# Patient Record
Sex: Male | Born: 1935 | Race: White | Hispanic: No | Smoking: Former smoker
Health system: Southern US, Community
[De-identification: ages and names within clinical notes are randomized; demographics above are authoritative.]

## PROBLEM LIST (undated history)

## (undated) DIAGNOSIS — D126 Benign neoplasm of colon, unspecified: Secondary | ICD-10-CM

## (undated) DIAGNOSIS — G459 Transient cerebral ischemic attack, unspecified: Secondary | ICD-10-CM

## (undated) DIAGNOSIS — E785 Hyperlipidemia, unspecified: Secondary | ICD-10-CM

## (undated) DIAGNOSIS — J309 Allergic rhinitis, unspecified: Secondary | ICD-10-CM

## (undated) DIAGNOSIS — L723 Sebaceous cyst: Secondary | ICD-10-CM

## (undated) DIAGNOSIS — K5790 Diverticulosis of intestine, part unspecified, without perforation or abscess without bleeding: Secondary | ICD-10-CM

## (undated) DIAGNOSIS — C4490 Unspecified malignant neoplasm of skin, unspecified: Secondary | ICD-10-CM

## (undated) DIAGNOSIS — N400 Enlarged prostate without lower urinary tract symptoms: Secondary | ICD-10-CM

## (undated) DIAGNOSIS — N529 Male erectile dysfunction, unspecified: Secondary | ICD-10-CM

## (undated) DIAGNOSIS — L817 Pigmented purpuric dermatosis: Secondary | ICD-10-CM

## (undated) DIAGNOSIS — R413 Other amnesia: Secondary | ICD-10-CM

## (undated) HISTORY — DX: Male erectile dysfunction, unspecified: N52.9

## (undated) HISTORY — PX: COLONOSCOPY W/ POLYPECTOMY: SHX1380

## (undated) HISTORY — DX: Diverticulosis of intestine, part unspecified, without perforation or abscess without bleeding: K57.90

## (undated) HISTORY — DX: Pigmented purpuric dermatosis: L81.7

## (undated) HISTORY — DX: Transient cerebral ischemic attack, unspecified: G45.9

## (undated) HISTORY — DX: Allergic rhinitis, unspecified: J30.9

## (undated) HISTORY — PX: TONSILLECTOMY: SUR1361

## (undated) HISTORY — DX: Sebaceous cyst: L72.3

## (undated) HISTORY — DX: Hyperlipidemia, unspecified: E78.5

## (undated) HISTORY — DX: Benign prostatic hyperplasia without lower urinary tract symptoms: N40.0

## (undated) HISTORY — DX: Other amnesia: R41.3

## (undated) HISTORY — DX: Unspecified malignant neoplasm of skin, unspecified: C44.90

## (undated) HISTORY — DX: Benign neoplasm of colon, unspecified: D12.6

---

## 1988-05-05 DIAGNOSIS — E785 Hyperlipidemia, unspecified: Secondary | ICD-10-CM | POA: Insufficient documentation

## 1988-05-05 HISTORY — DX: Hyperlipidemia, unspecified: E78.5

## 1993-05-05 DIAGNOSIS — D126 Benign neoplasm of colon, unspecified: Secondary | ICD-10-CM

## 1993-05-05 HISTORY — DX: Benign neoplasm of colon, unspecified: D12.6

## 1993-05-05 HISTORY — PX: SEPTOPLASTY: SUR1290

## 1998-05-05 DIAGNOSIS — C4490 Unspecified malignant neoplasm of skin, unspecified: Secondary | ICD-10-CM

## 1998-05-05 HISTORY — DX: Unspecified malignant neoplasm of skin, unspecified: C44.90

## 2000-09-21 ENCOUNTER — Encounter (INDEPENDENT_AMBULATORY_CARE_PROVIDER_SITE_OTHER): Payer: Self-pay

## 2000-09-21 ENCOUNTER — Other Ambulatory Visit: Admission: RE | Admit: 2000-09-21 | Discharge: 2000-09-21 | Payer: Self-pay | Admitting: Gastroenterology

## 2000-09-21 DIAGNOSIS — K5730 Diverticulosis of large intestine without perforation or abscess without bleeding: Secondary | ICD-10-CM

## 2000-09-21 DIAGNOSIS — K5790 Diverticulosis of intestine, part unspecified, without perforation or abscess without bleeding: Secondary | ICD-10-CM

## 2000-09-21 HISTORY — DX: Diverticulosis of intestine, part unspecified, without perforation or abscess without bleeding: K57.90

## 2000-09-21 HISTORY — DX: Diverticulosis of large intestine without perforation or abscess without bleeding: K57.30

## 2002-11-28 ENCOUNTER — Encounter: Payer: Self-pay | Admitting: Family Medicine

## 2002-11-28 ENCOUNTER — Encounter: Admission: RE | Admit: 2002-11-28 | Discharge: 2002-11-28 | Payer: Self-pay | Admitting: Family Medicine

## 2003-07-04 HISTORY — PX: OTHER SURGICAL HISTORY: SHX169

## 2003-10-05 HISTORY — PX: OTHER SURGICAL HISTORY: SHX169

## 2004-02-03 ENCOUNTER — Encounter: Payer: Self-pay | Admitting: Family Medicine

## 2004-02-03 LAB — CONVERTED CEMR LAB: PSA: 2 ng/mL

## 2004-08-03 ENCOUNTER — Encounter: Payer: Self-pay | Admitting: Family Medicine

## 2004-08-03 LAB — CONVERTED CEMR LAB: PSA: 2.8 ng/mL

## 2004-08-09 ENCOUNTER — Ambulatory Visit: Payer: Self-pay | Admitting: Family Medicine

## 2004-08-13 ENCOUNTER — Ambulatory Visit: Payer: Self-pay | Admitting: Family Medicine

## 2005-02-12 ENCOUNTER — Ambulatory Visit: Payer: Self-pay | Admitting: Family Medicine

## 2005-08-03 ENCOUNTER — Encounter: Payer: Self-pay | Admitting: Family Medicine

## 2005-08-03 LAB — CONVERTED CEMR LAB: PSA: 5.16 ng/mL

## 2005-08-11 ENCOUNTER — Ambulatory Visit: Payer: Self-pay | Admitting: Family Medicine

## 2005-08-13 ENCOUNTER — Ambulatory Visit: Payer: Self-pay | Admitting: Family Medicine

## 2005-08-22 ENCOUNTER — Ambulatory Visit: Payer: Self-pay | Admitting: Family Medicine

## 2005-08-25 ENCOUNTER — Ambulatory Visit: Payer: Self-pay | Admitting: Family Medicine

## 2005-09-02 ENCOUNTER — Encounter: Payer: Self-pay | Admitting: Family Medicine

## 2005-09-02 LAB — CONVERTED CEMR LAB: PSA: 5.24 ng/mL

## 2005-09-12 ENCOUNTER — Ambulatory Visit: Payer: Self-pay | Admitting: Family Medicine

## 2005-09-19 ENCOUNTER — Ambulatory Visit: Payer: Self-pay | Admitting: Family Medicine

## 2005-11-02 ENCOUNTER — Encounter: Payer: Self-pay | Admitting: Family Medicine

## 2005-11-02 LAB — CONVERTED CEMR LAB: PSA: 4.39 ng/mL

## 2005-11-13 ENCOUNTER — Ambulatory Visit: Payer: Self-pay | Admitting: Family Medicine

## 2005-11-18 ENCOUNTER — Ambulatory Visit: Payer: Self-pay | Admitting: Family Medicine

## 2006-01-03 ENCOUNTER — Encounter: Payer: Self-pay | Admitting: Family Medicine

## 2006-01-03 LAB — CONVERTED CEMR LAB: PSA: 4.32 ng/mL

## 2006-01-20 ENCOUNTER — Ambulatory Visit: Payer: Self-pay | Admitting: Family Medicine

## 2006-08-21 ENCOUNTER — Ambulatory Visit: Payer: Self-pay | Admitting: Family Medicine

## 2006-08-21 LAB — CONVERTED CEMR LAB
ALT: 26 units/L (ref 0–40)
AST: 28 units/L (ref 0–37)
Albumin: 4.1 g/dL (ref 3.5–5.2)
Alkaline Phosphatase: 55 units/L (ref 39–117)
BUN: 11 mg/dL (ref 6–23)
Bilirubin, Direct: 0.1 mg/dL (ref 0.0–0.3)
CO2: 26 meq/L (ref 19–32)
Calcium: 9.1 mg/dL (ref 8.4–10.5)
Chloride: 108 meq/L (ref 96–112)
Cholesterol: 232 mg/dL (ref 0–200)
Creatinine, Ser: 1.2 mg/dL (ref 0.4–1.5)
Direct LDL: 157.4 mg/dL
GFR calc Af Amer: 76 mL/min
GFR calc non Af Amer: 63 mL/min
Glucose, Bld: 95 mg/dL (ref 70–99)
HDL: 36.2 mg/dL — ABNORMAL LOW (ref >39.0)
PSA: 4.67 ng/mL — ABNORMAL HIGH (ref 0.10–4.00)
Potassium: 4.7 meq/L (ref 3.5–5.1)
Sodium: 142 meq/L (ref 135–145)
TSH: 1.51 microintl units/mL (ref 0.35–5.50)
Total Bilirubin: 1 mg/dL (ref 0.3–1.2)
Total CHOL/HDL Ratio: 6.4
Total Protein: 6.6 g/dL (ref 6.0–8.3)
Triglycerides: 198 mg/dL — ABNORMAL HIGH (ref 0–149)
VLDL: 40 mg/dL (ref 0–40)

## 2006-08-24 ENCOUNTER — Ambulatory Visit: Payer: Self-pay | Admitting: Family Medicine

## 2006-08-24 DIAGNOSIS — Z860100 Personal history of colon polyps, unspecified: Secondary | ICD-10-CM | POA: Insufficient documentation

## 2006-08-24 DIAGNOSIS — R972 Elevated prostate specific antigen [PSA]: Secondary | ICD-10-CM | POA: Insufficient documentation

## 2006-08-24 HISTORY — DX: Personal history of colonic polyps: Z86.010

## 2006-08-24 HISTORY — DX: Personal history of colon polyps, unspecified: Z86.0100

## 2006-09-11 ENCOUNTER — Encounter (HOSPITAL_BASED_OUTPATIENT_CLINIC_OR_DEPARTMENT_OTHER): Admission: RE | Admit: 2006-09-11 | Discharge: 2006-12-10 | Payer: Self-pay | Admitting: Surgery

## 2006-09-24 ENCOUNTER — Ambulatory Visit: Payer: Self-pay | Admitting: Gastroenterology

## 2006-10-08 ENCOUNTER — Encounter: Payer: Self-pay | Admitting: Gastroenterology

## 2006-10-08 ENCOUNTER — Encounter: Payer: Self-pay | Admitting: Family Medicine

## 2006-10-08 ENCOUNTER — Ambulatory Visit: Payer: Self-pay | Admitting: Gastroenterology

## 2006-12-14 ENCOUNTER — Encounter: Payer: Self-pay | Admitting: Family Medicine

## 2006-12-28 ENCOUNTER — Encounter: Payer: Self-pay | Admitting: Family Medicine

## 2007-02-16 ENCOUNTER — Encounter: Payer: Self-pay | Admitting: Family Medicine

## 2007-02-24 ENCOUNTER — Encounter: Payer: Self-pay | Admitting: Family Medicine

## 2007-02-25 ENCOUNTER — Encounter: Payer: Self-pay | Admitting: Family Medicine

## 2007-04-13 ENCOUNTER — Encounter: Payer: Self-pay | Admitting: Family Medicine

## 2007-05-06 HISTORY — PX: OTHER SURGICAL HISTORY: SHX169

## 2007-05-21 ENCOUNTER — Encounter: Payer: Self-pay | Admitting: Family Medicine

## 2007-05-28 ENCOUNTER — Encounter: Payer: Self-pay | Admitting: Family Medicine

## 2007-06-18 ENCOUNTER — Encounter: Payer: Self-pay | Admitting: Family Medicine

## 2007-08-19 ENCOUNTER — Ambulatory Visit: Payer: Self-pay | Admitting: Family Medicine

## 2007-08-19 LAB — CONVERTED CEMR LAB
ALT: 23 units/L (ref 0–53)
AST: 27 units/L (ref 0–37)
Albumin: 4.2 g/dL (ref 3.5–5.2)
Alkaline Phosphatase: 59 units/L (ref 39–117)
BUN: 11 mg/dL (ref 6–23)
Bilirubin, Direct: 0.1 mg/dL (ref 0.0–0.3)
CO2: 31 meq/L (ref 19–32)
Calcium: 9.4 mg/dL (ref 8.4–10.5)
Chloride: 107 meq/L (ref 96–112)
Cholesterol: 204 mg/dL (ref 0–200)
Creatinine, Ser: 1.1 mg/dL (ref 0.4–1.5)
Direct LDL: 143 mg/dL
GFR calc Af Amer: 84 mL/min
GFR calc non Af Amer: 69 mL/min
Glucose, Bld: 92 mg/dL (ref 70–99)
HDL: 33.3 mg/dL — ABNORMAL LOW (ref >39.0)
PSA: 4.69 ng/mL — ABNORMAL HIGH (ref 0.10–4.00)
Potassium: 4.7 meq/L (ref 3.5–5.1)
Sodium: 140 meq/L (ref 135–145)
TSH: 2.04 microintl units/mL (ref 0.35–5.50)
Total Bilirubin: 0.9 mg/dL (ref 0.3–1.2)
Total CHOL/HDL Ratio: 6.1
Total Protein: 7.1 g/dL (ref 6.0–8.3)
Triglycerides: 160 mg/dL — ABNORMAL HIGH (ref 0–149)
VLDL: 32 mg/dL (ref 0–40)

## 2007-08-24 ENCOUNTER — Ambulatory Visit: Payer: Self-pay | Admitting: Family Medicine

## 2007-08-24 DIAGNOSIS — G459 Transient cerebral ischemic attack, unspecified: Secondary | ICD-10-CM | POA: Insufficient documentation

## 2007-09-08 ENCOUNTER — Encounter: Payer: Self-pay | Admitting: Family Medicine

## 2007-09-08 ENCOUNTER — Ambulatory Visit: Payer: Self-pay

## 2007-09-08 HISTORY — PX: OTHER SURGICAL HISTORY: SHX169

## 2007-09-15 ENCOUNTER — Encounter: Payer: Self-pay | Admitting: Family Medicine

## 2007-09-29 ENCOUNTER — Ambulatory Visit: Payer: Self-pay | Admitting: Family Medicine

## 2007-09-29 LAB — CONVERTED CEMR LAB
ALT: 33 units/L (ref 0–53)
AST: 35 units/L (ref 0–37)

## 2007-11-19 ENCOUNTER — Ambulatory Visit: Payer: Self-pay | Admitting: Family Medicine

## 2007-11-21 LAB — CONVERTED CEMR LAB
ALT: 34 units/L (ref 0–53)
AST: 32 units/L (ref 0–37)
Cholesterol: 133 mg/dL (ref 0–200)
HDL: 38.9 mg/dL — ABNORMAL LOW (ref >39.0)
LDL Cholesterol: 71 mg/dL (ref 0–99)
Total CHOL/HDL Ratio: 3.4
Triglycerides: 117 mg/dL (ref 0–149)
VLDL: 23 mg/dL (ref 0–40)

## 2007-11-23 ENCOUNTER — Encounter: Payer: Self-pay | Admitting: Family Medicine

## 2007-11-25 ENCOUNTER — Ambulatory Visit: Payer: Self-pay | Admitting: Family Medicine

## 2008-05-01 ENCOUNTER — Ambulatory Visit: Payer: Self-pay | Admitting: Family Medicine

## 2008-05-26 ENCOUNTER — Encounter: Payer: Self-pay | Admitting: Family Medicine

## 2008-06-29 ENCOUNTER — Encounter: Payer: Self-pay | Admitting: Family Medicine

## 2008-09-28 ENCOUNTER — Ambulatory Visit: Payer: Self-pay | Admitting: Family Medicine

## 2008-09-28 LAB — CONVERTED CEMR LAB
ALT: 39 units/L (ref 0–53)
AST: 37 units/L (ref 0–37)
Albumin: 4.2 g/dL (ref 3.5–5.2)
Alkaline Phosphatase: 50 units/L (ref 39–117)
BUN: 12 mg/dL (ref 6–23)
Basophils Absolute: 0 10*3/uL (ref 0.0–0.1)
Basophils Relative: 0.6 % (ref 0.0–3.0)
Bilirubin, Direct: 0.1 mg/dL (ref 0.0–0.3)
CO2: 28 meq/L (ref 19–32)
Calcium: 8.8 mg/dL (ref 8.4–10.5)
Chloride: 108 meq/L (ref 96–112)
Cholesterol: 138 mg/dL (ref 0–200)
Creatinine, Ser: 1 mg/dL (ref 0.4–1.5)
Eosinophils Absolute: 0.1 10*3/uL (ref 0.0–0.7)
Eosinophils Relative: 1.6 % (ref 0.0–5.0)
GFR calc non Af Amer: 76.88 mL/min (ref >60)
Glucose, Bld: 86 mg/dL (ref 70–99)
HCT: 45.6 % (ref 39.0–52.0)
HDL: 38.4 mg/dL — ABNORMAL LOW (ref >39.00)
Hemoglobin: 16.1 g/dL (ref 13.0–17.0)
LDL Cholesterol: 70 mg/dL (ref 0–99)
Lymphocytes Relative: 29.5 % (ref 12.0–46.0)
Lymphs Abs: 1.5 10*3/uL (ref 0.7–4.0)
MCHC: 35.4 g/dL (ref 30.0–36.0)
MCV: 93.2 fL (ref 78.0–100.0)
Monocytes Absolute: 0.5 10*3/uL (ref 0.1–1.0)
Monocytes Relative: 10.8 % (ref 3.0–12.0)
Neutro Abs: 2.9 10*3/uL (ref 1.4–7.7)
Neutrophils Relative %: 57.5 % (ref 43.0–77.0)
PSA: 3.93 ng/mL (ref 0.10–4.00)
Platelets: 168 10*3/uL (ref 150.0–400.0)
Potassium: 4.7 meq/L (ref 3.5–5.1)
RBC: 4.89 M/uL (ref 4.22–5.81)
RDW: 11.6 % (ref 11.5–14.6)
Sodium: 140 meq/L (ref 135–145)
TSH: 1.56 microintl units/mL (ref 0.35–5.50)
Total Bilirubin: 1 mg/dL (ref 0.3–1.2)
Total CHOL/HDL Ratio: 4
Total Protein: 7 g/dL (ref 6.0–8.3)
Triglycerides: 147 mg/dL (ref 0.0–149.0)
VLDL: 29.4 mg/dL (ref 0.0–40.0)
WBC: 5 10*3/uL (ref 4.5–10.5)

## 2008-10-03 ENCOUNTER — Ambulatory Visit: Payer: Self-pay | Admitting: Family Medicine

## 2008-10-03 DIAGNOSIS — R030 Elevated blood-pressure reading, without diagnosis of hypertension: Secondary | ICD-10-CM | POA: Insufficient documentation

## 2009-01-03 ENCOUNTER — Ambulatory Visit: Payer: Self-pay | Admitting: Family Medicine

## 2009-01-03 DIAGNOSIS — K5909 Other constipation: Secondary | ICD-10-CM

## 2009-01-03 DIAGNOSIS — K5641 Fecal impaction: Secondary | ICD-10-CM | POA: Insufficient documentation

## 2009-05-05 HISTORY — PX: OTHER SURGICAL HISTORY: SHX169

## 2009-09-18 ENCOUNTER — Encounter (INDEPENDENT_AMBULATORY_CARE_PROVIDER_SITE_OTHER): Payer: Self-pay | Admitting: *Deleted

## 2009-09-20 ENCOUNTER — Encounter (INDEPENDENT_AMBULATORY_CARE_PROVIDER_SITE_OTHER): Payer: Self-pay | Admitting: *Deleted

## 2009-10-04 ENCOUNTER — Ambulatory Visit: Payer: Self-pay | Admitting: Family Medicine

## 2009-10-04 LAB — CONVERTED CEMR LAB
ALT: 30 units/L (ref 0–53)
AST: 31 units/L (ref 0–37)
Albumin: 4.4 g/dL (ref 3.5–5.2)
Alkaline Phosphatase: 49 units/L (ref 39–117)
BUN: 16 mg/dL (ref 6–23)
Basophils Absolute: 0 10*3/uL (ref 0.0–0.1)
Basophils Relative: 0.7 % (ref 0.0–3.0)
Bilirubin, Direct: 0.2 mg/dL (ref 0.0–0.3)
CO2: 30 meq/L (ref 19–32)
Calcium: 9.2 mg/dL (ref 8.4–10.5)
Chloride: 106 meq/L (ref 96–112)
Cholesterol: 140 mg/dL (ref 0–200)
Creatinine, Ser: 0.9 mg/dL (ref 0.4–1.5)
Eosinophils Absolute: 0.1 10*3/uL (ref 0.0–0.7)
Eosinophils Relative: 1 % (ref 0.0–5.0)
GFR calc non Af Amer: 86.59 mL/min (ref >60)
Glucose, Bld: 85 mg/dL (ref 70–99)
HCT: 46 % (ref 39.0–52.0)
HDL: 42.8 mg/dL (ref >39.00)
Hemoglobin: 16 g/dL (ref 13.0–17.0)
LDL Cholesterol: 75 mg/dL (ref 0–99)
Lymphocytes Relative: 31.9 % (ref 12.0–46.0)
Lymphs Abs: 1.7 10*3/uL (ref 0.7–4.0)
MCHC: 34.9 g/dL (ref 30.0–36.0)
MCV: 94.5 fL (ref 78.0–100.0)
Monocytes Absolute: 0.6 10*3/uL (ref 0.1–1.0)
Monocytes Relative: 10.6 % (ref 3.0–12.0)
Neutro Abs: 3 10*3/uL (ref 1.4–7.7)
Neutrophils Relative %: 55.8 % (ref 43.0–77.0)
PSA: 5.58 ng/mL — ABNORMAL HIGH (ref 0.10–4.00)
Platelets: 158 10*3/uL (ref 150.0–400.0)
Potassium: 4.7 meq/L (ref 3.5–5.1)
RBC: 4.87 M/uL (ref 4.22–5.81)
RDW: 12.9 % (ref 11.5–14.6)
Sodium: 142 meq/L (ref 135–145)
TSH: 1.62 microintl units/mL (ref 0.35–5.50)
Total Bilirubin: 0.8 mg/dL (ref 0.3–1.2)
Total CHOL/HDL Ratio: 3
Total Protein: 6.8 g/dL (ref 6.0–8.3)
Triglycerides: 113 mg/dL (ref 0.0–149.0)
VLDL: 22.6 mg/dL (ref 0.0–40.0)
WBC: 5.3 10*3/uL (ref 4.5–10.5)

## 2009-10-09 ENCOUNTER — Ambulatory Visit: Payer: Self-pay | Admitting: Family Medicine

## 2009-10-09 DIAGNOSIS — N138 Other obstructive and reflux uropathy: Secondary | ICD-10-CM

## 2009-10-09 DIAGNOSIS — N401 Enlarged prostate with lower urinary tract symptoms: Secondary | ICD-10-CM | POA: Insufficient documentation

## 2009-10-09 HISTORY — DX: Benign prostatic hyperplasia with lower urinary tract symptoms: N40.1

## 2009-10-09 HISTORY — DX: Other obstructive and reflux uropathy: N13.8

## 2009-10-10 ENCOUNTER — Telehealth: Payer: Self-pay | Admitting: Family Medicine

## 2009-10-11 ENCOUNTER — Telehealth: Payer: Self-pay | Admitting: Family Medicine

## 2009-10-12 ENCOUNTER — Encounter (INDEPENDENT_AMBULATORY_CARE_PROVIDER_SITE_OTHER): Payer: Self-pay | Admitting: *Deleted

## 2009-10-17 ENCOUNTER — Ambulatory Visit: Payer: Self-pay | Admitting: Gastroenterology

## 2009-10-18 ENCOUNTER — Telehealth: Payer: Self-pay | Admitting: Gastroenterology

## 2009-10-26 ENCOUNTER — Telehealth: Payer: Self-pay | Admitting: Gastroenterology

## 2009-10-31 ENCOUNTER — Ambulatory Visit: Payer: Self-pay | Admitting: Gastroenterology

## 2009-11-01 LAB — HM COLONOSCOPY

## 2009-12-06 ENCOUNTER — Encounter (INDEPENDENT_AMBULATORY_CARE_PROVIDER_SITE_OTHER): Payer: Self-pay | Admitting: *Deleted

## 2010-05-02 ENCOUNTER — Ambulatory Visit
Admission: RE | Admit: 2010-05-02 | Discharge: 2010-05-02 | Payer: Self-pay | Source: Home / Self Care | Attending: Family Medicine | Admitting: Family Medicine

## 2010-05-02 DIAGNOSIS — K644 Residual hemorrhoidal skin tags: Secondary | ICD-10-CM | POA: Insufficient documentation

## 2010-05-02 HISTORY — DX: Residual hemorrhoidal skin tags: K64.4

## 2010-06-03 ENCOUNTER — Telehealth: Payer: Self-pay | Admitting: Family Medicine

## 2010-06-06 NOTE — Letter (Signed)
Summary: Nadara Eaton letter  [place] at [facility]  [address] [place], [place] [address]   Phone: [phone]  Fax: [phone]       12/06/2009 MRN: [id number]  Jeffrey [name] [address] [place], [place]  [address]  Dear Jeffrey [name] [place], and [facility] announce the retirement of Arta Silence, M.D., from full-time practice at the [facility] office effective November 01, 2009 and his plans of returning part-time.  It is important to Dr. Hetty Ely and to our practice that you understand that [facility] has seven physicians in our office for your health care needs.  We will continue to offer the same exceptional care that you have today.    Dr. Hetty Ely has spoken to many of you about his plans for retirement and returning part-time in the fall.   We will continue to work with you through the transition to schedule appointments for you in the office and meet the high standards that [place] is committed to.   Again, it is with great pleasure that we share the news that Dr. Hetty Ely will return to [facility] in October of 2011 with a reduced schedule.    If you have any questions, or would like to request an appointment with one of our physicians, please call us at [phone] and press the option for Scheduling an appointment.  We take pleasure in providing you with excellent patient care and look forward to seeing you at your next office visit.  Our [facility] Physicians are:  Tillman Abide, M.D. Laurita Quint, M.D. Roxy Manns, M.D. Kerby Nora, M.D. Hannah Beat, M.D. Ruthe Mannan, M.D. We proudly welcomed Raechel Ache, M.D. and Eustaquio Boyden, M.D. to the practice in July/August 2011.  Sincerely,  [place] [facility]

## 2010-06-06 NOTE — Progress Notes (Signed)
Summary: speak to Alvino Chapel   Phone Note Call from Patient Call back at Home Phone [phone]   Caller: Keith Rake Call For: Russella Dar Reason for Call: Talk to Nurse Summary of Call: Patient states that he forgot to tell you about surgeries he had done in the past.  Initial call taken by: Tawni Levy,  October 18, 2009 8:13 AM  Follow-up for Phone Call        Pt. wanted me to know he had a basal cell cancer removed from his cheek and a melanoma from his scalp which resulted in plastic surgery. Follow-up by: Wyona Almas RN,  October 18, 2009 8:21 AM

## 2010-06-06 NOTE — Letter (Signed)
Summary: [facility]/Plastic & Reconstructive Surgery/Dr.   [facility]/Plastic & Reconstructive Surgery/Dr. Dawna Part   Imported By: Eleonore Chiquito 06/07/2008 14:49:19  _____________________________________________________________________  External Attachment:    Type:   Image     Comment:   External Document

## 2010-06-06 NOTE — Consult Note (Signed)
Summary: Dr. Jeanie Sewer - Dermatology  Dr. Jeanie Sewer - Dermatology   Imported By: Beau Fanny 01/15/2007 10:15:28  _____________________________________________________________________  External Attachment:    Type:   Image     Comment:   External Document

## 2010-06-06 NOTE — Procedures (Signed)
Summary: Colonoscopy  Patient: Jeffrey [name] Note: All result statuses are Final unless otherwise noted.  Tests: (1) Colonoscopy (COL)   COL Colonoscopy           DONE     [facility]     [address]     [place], [place]  [address]           COLONOSCOPY PROCEDURE REPORT           PATIENT:  Aman, Bonet  MR#:  [id number]     BIRTHDATE:  November 16, 1928, 80 yrs. old  GENDER:  male     ENDOSCOPIST:  Judie Petit T. Russella Dar, MD, [facility]           PROCEDURE DATE:  10/31/2009     PROCEDURE:  Colonoscopy with snare polypectomy     ASA CLASS:  Class II     INDICATIONS:  1) follow-up of polyp  2) family history of colon     cancer  3) surveillance and high-risk screening, adenomatous polyp     with HGD, 1995. Father with rectal cancer.     MEDICATIONS:   Fentanyl 50 mcg IV, Versed 4 mg IV     DESCRIPTION OF PROCEDURE:   After the risks benefits and     alternatives of the procedure were thoroughly explained, informed     consent was obtained.  Digital rectal exam was performed and     revealed no abnormalities.   The LB [id number]-[id number] endoscope     was introduced through the anus and advanced to the cecum, which     was identified by both the appendix and ileocecal valve, without     limitations.  The quality of the prep was good, using MoviPrep.     The instrument was then slowly withdrawn as the colon was fully     examined.     <<PROCEDUREIMAGES>>     FINDINGS:  A sessile polyp was found in the ascending colon. It     was 4 mm in size. Polyp was snared without cautery. Retrieval was     unsuccessful.  A normal appearing cecum, ileocecal valve, and     appendiceal orifice were identified. The hepatic flexure,     transverse, splenic flexure, descending, sigmoid colon, and rectum     appeared unremarkable. Retroflexed views in the rectum revealed     internal hemorrhoids, small.  The time to cecum =  2.5  minutes.     The scope was then withdrawn (time =  8.33  min) from the  patient     and the procedure completed.           OMPLICATIONS:  None           ENDOSCOPIC IMPRESSION:     1) 4 mm sessile polyp in the ascending colon     2) Internal hemorrhoids           RECOMMENDATIONS:     1) Repeat Colonoscopy in 3 years.           Venita Lick. Russella Dar, MD, [facility]           CC: Arta Silence, MD           n.     Rosalie DoctorVenita Lick. Rynn Markiewicz at 10/31/2009 11:40 AM           [place], [place], [id number]  Note: An exclamation mark (!) indicates a result that was not dispersed into the flowsheet. Document  Creation Date: 10/31/2009 11:40 AM _______________________________________________________________________  (1) Order result status: Final Collection or observation date-time: 10/31/2009 11:34 Requested date-time:  Receipt date-time:  Reported date-time:  Referring Physician:   Ordering Physician: Claudette Head [phone]) Specimen Source:  Source: Launa Grill Order Number: [id number] Lab site:   Appended Document: Colonoscopy recall 3 yr     Procedures Next Due Date:    Colonoscopy: 10/2015  Appended Document: Colonoscopy 3 yr recall     Procedures Next Due Date:    Colonoscopy: 10/2012

## 2010-06-06 NOTE — Assessment & Plan Note (Signed)
Summary: CPX/RBH   Vital Signs:  Patient profile:   77 year old male Height:      65.50 inches Weight:      158 pounds BMI:     25.99 Temp:     97.4 degrees F oral Pulse rate:   76 / minute Pulse rhythm:   regular BP sitting:   140 / 60  (left arm) Cuff size:   regular  Vitals Entered By: Providence Crosby (October 03, 2008 8:20 AM)  History of Present Illness: Pt here for followup. He has been having allergy sxs for the last few weeks. He was given cortisone injections in the remote past by his old Family Doc but doctors sincde then have been hesitant to continue that practice. He is using Loratadine and Nasalcrom which has helped. Hid nose and eyes are becominfg much more sensitive when he reads anything.  He had revision of his skin cancer area of the vertex of his scalp this past year and has finally healed. His constipation is well controlled with Metamucil cookie and Prep H.  Problems Prior to Update: 1)  Transient Ischemic Attack  (ICD-435.9) 2)  Colonic Polyps, Hx of  (ICD-V12.72) 3)  Elevated Prostate Specific Antigen  (ICD-790.93) 4)  Skin Cancer, Hx of Vertex of Scalp  (ICD-V10.83) 5)  Hyperlipidemia  (ICD-272.4) 6)  Diverticulosis, Colon  (ICD-562.10)  Medications Prior to Update: 1)  Viagra 100 Mg Tabs (Sildenafil Citrate) .Marland Kitchen.. 1 Hour Pior Relations 2)  Hypotears 1-1 %  Soln (Polyethyl Glycol-Polyvinyl Alc) .... Use As Needed As Needed 3)  Multivitamins   Tabs (Multiple Vitamin) .Marland Kitchen.. 1 Daily By Mouth 4)  Loratadine 10 Mg  Tabs (Loratadine) .Marland Kitchen.. 1 Daily By Mouth As Needed 5)  Nasalcrom 5.2 Mg/act  Aers (Cromolyn Sodium) .... Use As Needed 6)  Bayer Aspirin Ec Low Dose 81 Mg  Tbec (Aspirin) .Marland Kitchen.. 1 Daily By Mouth 7)  Pravachol 40 Mg  Tabs (Pravastatin Sodium) .... One Tab By Mouth Hs 8)  Metamucil 1.7 Gm Wafr (Psyllium) .Marland Kitchen.. 1 Daily By Mouth 9)  Hemorrhoidal Prep 3-1:10000-2000 Oint (Skin Rsp Ftr-Srk Liv-Phenylmer) .... As Needed Otc 10)  Coq10 100 Mg Caps (Coenzyme Q10) .Marland Kitchen..  1 Daily By Mouth 11)  Quercetin 250 Mg Tabs (Quercetin) .... 500 Mg Twice A Day By Mouth  Allergies: 1)  ! 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 Married 1 Son Former Smoker Quit 1960 5pyh Alcohol use-no Drug use-no  Risk Factors: Caffeine Use: 0 (08/24/2007) Exercise: yes (08/24/2007)  Risk Factors: Smoking Status: quit (08/24/2006) Packs/Day: 1960 5pyh (08/24/2006) Passive Smoke Exposure: no (08/24/2007)  Past Surgical History: Septoplasty 1995 Colonoscopy Polyps B9 08/1993 Colonoscopy Polyps B9 09/21/2000 Colonoscopy Polyps B9 10/05/2003 Melanoma Scalp 07/2003 Colonoscopy Polyps Divertics int Hemms 10/05/2003        Colonoscopy Polyps B9 Int Hemms       next 2011 ECHO NML LVF EF 50-55% Mild Diast Dysfctn Triv AR  09/08/2007 Carotid U/S NML 09/08/2007 Skin revision vertex of scalp, (Dr Dawna Part, [facility]) 2009  Family History: Father dec 87 Rectal Ca at 80yoa Valve Leaks Mother dec 88 NH fell-coma Brother dec 73 NH Sister A 46  Review of Systems General:  Denies chills, fatigue, fever, loss of appetite, malaise, sleep disorder, sweats, weakness, and weight loss. Eyes:  Complains of blurring and discharge; denies double vision, eye irritation, eye pain, halos, itching, light sensitivity, red eye, vision loss-1 eye, and vision loss-both eyes; dryness. ENT:  Complains of decreased hearing and ringing in ears; denies difficulty swallowing, ear discharge, earache, hoarseness, nasal congestion, nosebleeds, postnasal drainage, sinus pressure, and sore throat; chronically. CV:  Denies bluish discoloration of lips or nails,  chest pain or discomfort, difficulty breathing at night, difficulty breathing while lying down, fainting, fatigue, leg cramps with exertion, lightheadness, near fainting, palpitations, shortness of breath with exertion, swelling of feet, swelling of hands, and weight gain. Resp:  Denies chest discomfort, chest pain with inspiration, cough, coughing up blood, excessive snoring, hypersomnolence, morning headaches, pleuritic, shortness of breath, sputum productive, and wheezing. GI:  Complains of constipation; denies abdominal pain, bloody stools, change in bowel habits, dark tarry stools, diarrhea, excessive appetite, gas, hemorrhoids, indigestion, loss of appetite, nausea, vomiting, vomiting blood, and yellowish skin color; occas blood with hard stool, not recently . GU:  Complains of nocturia; denies decreased libido, discharge, dysuria, erectile dysfunction, genital sores, hematuria, incontinence, urinary frequency, and urinary hesitancy; occas once. MS:  Denies joint pain, joint redness, joint swelling, loss of strength, low back pain, mid back pain, muscle aches, muscle , cramps, muscle weakness, stiffness, and thoracic pain. Derm:  Complains of dryness; denies changes in color of skin, changes in nail beds, excessive perspiration, flushing, hair loss, insect bite(s), itching, lesion(s), poor wound healing, and rash. Neuro:  Denies brief paralysis, difficulty with concentration, disturbances in coordination, falling down, headaches, inability to speak, memory loss, numbness, poor balance, seizures, sensation of room spinning, tingling, tremors, visual disturbances, and weakness.  Physical Exam  General:  Well-developed,well-nourished,in no acute distress; alert,appropriate and cooperative throughout examination Head:  Normocephalic and atraumatic without obvious abnormalities. No apparent alopecia but typical male pattern  balding. Scalp well healed but dect along incision line. Eyes:  Conjunctiva  injected palpebrally. Ears:  External ear exam shows no significant lesions or deformities.  Otoscopic examination reveals clear canals, tympanic membranes are intact bilaterally without bulging, retraction, inflammation or discharge. Hearing is grossly normal bilaterally. Cerumen occluding bilat, removed wiith curette, nml behind. Nose:  External nasal examination shows no deformity or inflammation. Nasal mucosa are pink and moist without lesions or exudates. Mouth:  Oral mucosa and oropharynx without lesions or exudates.  Teeth in good repair. Neck:  No deformities, masses, or tenderness noted. Chest Wall:  No deformities, masses, tenderness or gynecomastia noted. Breasts:  No masses or gynecomastia noted Lungs:  Normal respiratory effort, chest expands symmetrically. Lungs are clear to auscultation, no crackles or wheezes. Heart:  Normal rate and regular rhythm. S1 and S2 normal without gallop, murmur, click, rub or other extra sounds. Abdomen:  Bowel sounds positive,abdomen soft and non-tender without masses, organomegaly or hernias noted. Rectal:  No external abnormalities noted. Normal sphincter tone. No rectal masses or tenderness. Slight sclerosis at left rectal verge. G neg. Genitalia:  Testes bilaterally descended without nodularity, tenderness or masses. No scrotal masses or lesions. No penis lesions or urethral discharge. Prostate:  Prostate gland firm and smooth, no enlargement, nodularity, tenderness, mass, asymmetry or induration. 10-20 gms. Msk:  No deformity or scoliosis noted of thoracic or lumbar spine.   Pulses:  R and L carotid,radial,femoral,dorsalis pedis and posterior tibial pulses are full and equal bilaterally Extremities:  No clubbing, cyanosis, edema, or  deformity noted with normal full range of motion of all joints.   Neurologic:  No cranial nerve deficits noted. Station and gait are normal. Plantar reflexes are down-going bilaterally. DTRs are symmetrical throughout.  Sensory, motor and coordinative functions appear intact. Skin:  Intact without suspicious lesions or rashes, well healed incision on vertex of scalp in middle of bald area. Cervical Nodes:  No lymphadenopathy noted Inguinal Nodes:  No significant adenopathy Psych:  Cognition and judgment appear intact. Alert and cooperative with normal attention span and concentration. No apparent delusions, illusions, hallucinations   Impression & Recommendations:  Problem # 1:  TRANSIENT ISCHEMIC ATTACK (ICD-435.9) Assessment Unchanged Stable.Cont as is. His updated medication list for this problem includes:    Bayer Aspirin Ec Low Dose 81 Mg Tbec (Aspirin) .Marland Kitchen... 1 daily by mouth  Problem # 2:  ELEVATED PROSTATE SPECIFIC ANTIGEN (ICD-790.93) Assessment: Improved Has decreased. Given graph of values.  Problem # 3:  SKIN CANCER, HX OF VERTEX OF SCALP (ICD-V10.83) Assessment: Improved Finally healed.  Problem # 4:  HYPERLIPIDEMIA (ICD-272.4) Adequate. Try to exercise more to increase HDL. His updated medication list for this problem includes:    Pravachol 40 Mg Tabs (Pravastatin sodium) ..... One tab by mouth hs  Labs Reviewed: SGOT: 37 (09/28/2008)   SGPT: 39 (09/28/2008)   HDL:38.40 (09/28/2008), 38.9 (11/19/2007)  LDL:70 (09/28/2008), 71 (11/19/2007)  Chol:138 (09/28/2008), 133 (11/19/2007)  Trig:147.0 (09/28/2008), 117 (11/19/2007)  Problem # 5:  DIVERTICULOSIS, COLON (ICD-562.10) Assessment: Unchanged  Discussed being seen for prolonged LLQ discomfort.  Labs Reviewed: Hgb: 16.1 (09/28/2008)   Hct: 45.6 (09/28/2008)   WBC: 5.0 (09/28/2008)  Problem # 6:  ELEVATED BLOOD PRESSURE WITHOUT DIAGNOSIS OF HYPERTENSION (ICD-796.2) Assessment: New  Will follow. See back in 3 mos for reassessment. Avoid salt .  BP today: 140/60 Prior BP: 120/70 (05/01/2008)  Labs Reviewed: Creat: 1.0 (09/28/2008) Chol: 138 (09/28/2008)   HDL: 38.40 (09/28/2008)   LDL: 70 (09/28/2008)   TG: 147.0  (09/28/2008)  Instructed in low sodium diet (DASH Handout) and behavior modification.    Problem # 7:  CERUMEN IMPACTION, BILATERAL (ICD-380.4) Assessment: Improved  Removed via currette. Use peroxide and discussed regular irrigation to kep clean.  Orders: Cerumen Impaction Removal ([id number])  Complete Medication List: 1)  Viagra 100 Mg Tabs (Sildenafil citrate) .Marland Kitchen.. 1 hour pior relations 2)  Hypotears 1-1 % Soln (Polyethyl glycol-polyvinyl alc) .... Use as needed as needed 3)  Multivitamins Tabs (Multiple vitamin) .Marland Kitchen.. 1 daily by mouth 4)  Loratadine 10 Mg Tabs (Loratadine) .Marland Kitchen.. 1 daily by mouth as needed 5)  Nasalcrom 5.2 Mg/act Aers (Cromolyn sodium) .... Use as needed 6)  Bayer Aspirin Ec Low Dose 81 Mg Tbec (Aspirin) .Marland Kitchen.. 1 daily by mouth 7)  Pravachol 40 Mg Tabs (Pravastatin sodium) .... One tab by mouth hs 8)  Metamucil 1.7 Gm Wafr (Psyllium) .Marland Kitchen.. 1 daily by mouth  Patient Instructions: 1)  RTC 3 mos for BP check.

## 2010-06-06 NOTE — Consult Note (Signed)
Summary: [facility]Dermatology Consultation Report/Dr. Deforest Hoyles [facility]/Dermatology Consultation Report/Dr. Jeanie Sewer   Imported By: Mickle Asper 05/05/2007 14:58:43  _____________________________________________________________________  External Attachment:    Type:   Image     Comment:   External Document

## 2010-06-06 NOTE — Assessment & Plan Note (Signed)
Summary: CPX/RBH   Vital Signs:  Patient profile:   77 year old male Weight:      156.50 pounds BMI:     25.74 Temp:     97.6 degrees F oral Pulse rate:   76 / minute Pulse rhythm:   regular BP sitting:   138 / 70  (left arm) Cuff size:   regular  Vitals Entered By: Sydell Axon LPN (October 10, 6043 8:04 AM) CC: 30 Minute checkup, had a colonoscopy 06/05, scheduled for another colonoscopy with Dr. Russella Dar on 10/31/09   History of Present Illness: Pt here for Comp Exam. He is having dribbling and frequency , he gets up once at night. He has not had urination probs since. He hasn't been back to Derm but needs to...he has had melanoma. He will get appt.  Preventive Screening-Counseling & Management  Alcohol-Tobacco     Alcohol drinks/day: 0     Smoking Status: quit     Packs/Day: 1960 5pyh     Year Quit: 1960     Pack years: 5     Passive Smoke Exposure: no  Caffeine-Diet-Exercise     Caffeine use/day: 0     Does Patient Exercise: yes     Type of exercise: light weights, walks     Times/week: 5  Problems Prior to Update: 1)  Constipation, Chronic  (ICD-564.09) 2)  Cerumen Impaction, Bilateral  (ICD-380.4) 3)  Elevated Blood Pressure Without Diagnosis of Hypertension  (ICD-796.2) 4)  Transient Ischemic Attack  (ICD-435.9) 5)  Colonic Polyps, Hx of  (ICD-V12.72) 6)  Elevated Prostate Specific Antigen  (ICD-790.93) 7)  Skin Cancer, Hx of Vertex of Scalp  (ICD-V10.83) 8)  Hyperlipidemia  (ICD-272.4) 9)  Diverticulosis, Colon  (ICD-562.10)  Medications Prior to Update: 1)  Viagra 100 Mg Tabs (Sildenafil Citrate) .Marland Kitchen.. 1 Hour Pior Relations 2)  Hypotears 1-1 %  Soln (Polyethyl Glycol-Polyvinyl Alc) .... Use As Needed As Needed 3)  Multivitamins   Tabs (Multiple Vitamin) .Marland Kitchen.. 1 Daily By Mouth 4)  Loratadine 10 Mg  Tabs (Loratadine) .Marland Kitchen.. 1 Daily By Mouth As Needed 5)  Nasalcrom 5.2 Mg/act  Aers (Cromolyn Sodium) .... Use As Needed 6)  Bayer Aspirin Ec Low Dose 81 Mg  Tbec  (Aspirin) .Marland Kitchen.. 1 Daily By Mouth 7)  Pravachol 40 Mg  Tabs (Pravastatin Sodium) .... One Tab By Mouth Hs 8)  Metamucil 1.7 Gm Wafr (Psyllium) .Marland Kitchen.. 1 Daily By Mouth  Allergies: 1)  ! * Red Dye  Past History:  Past Medical History: Last updated: 08/24/2006 Diverticulosis, colon (09/21/2000) Hyperlipidemia (05/05/1988) Skin cancer, hx of  Scalp  (05/05/1998)  Past Surgical History: Last updated: 10/03/2008 Septoplasty 1995 Colonoscopy Polyps B9 08/1993 Colonoscopy Polyps B9 09/21/2000 Colonoscopy Polyps B9 10/05/2003 Melanoma Scalp 07/2003 Colonoscopy Polyps Divertics int Hemms 10/05/2003        Colonoscopy Polyps B9 Int Hemms       next 2011 ECHO NML LVF EF 50-55% Mild Diast Dysfctn Triv AR  09/08/2007 Carotid U/S NML 09/08/2007 Skin revision vertex of scalp, (Dr Dawna Part, [facility]) 2009  Family History: Last updated: 10/09/2009 Father dec 87 Rectal Ca at 80yoa Valve Leaks Mother dec 88 NH fell-coma Brother dec 73 NH Sister A 2   Social History: Last updated: 08/24/2006 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 Eyes:  Denies blurring, discharge, and eye pain; has dry eyes.. ENT:  Complains of ringing in ears; denies decreased hearing, ear discharge, and earache; chronic. CV:  Denies chest pain or discomfort, fainting, fatigue, palpitations, shortness of breath with exertion, swelling of feet, and swelling of hands. Resp:  Denies cough, shortness of breath, and wheezing. GI:   Denies abdominal pain, bloody stools, change in bowel habits, constipation, dark tarry stools, diarrhea, indigestion, loss of appetite, nausea, vomiting, vomiting blood, and yellowish skin color. GU:  Complains of nocturia and urinary frequency; denies discharge and dysuria; see HPI. MS:  Complains of joint pain; denies low back pain, muscle aches, cramps, and stiffness; tops of feet hurt some...had been working on a ladder a lot.. Derm:  Denies dryness, itching, and rash; has had melanoma of the scalp. Neuro:  Denies numbness, poor balance, tingling, and tremors.   Impression & Recommendations:  Problem # 1:  ELEVATED PROSTATE SPECIFIC ANTIGEN (ICD-790.93) Assessment Deteriorated Has increased but not more than 10% A YEAR AVERAGE. wAS OVER 5 YEARS AGO. PSA elevated at 5.68.  Is now having what sounds like BPH.  Problem # 2:  ELEVATED BLOOD PRESSURE WITHOUT DIAGNOSIS OF HYPERTENSION (ICD-796.2) Assessment: Unchanged Stable but not elevated today but on the high side. BP today: 138/70 Prior BP: 120/80 (01/03/2009)  Labs Reviewed: Creat: 0.9 (10/04/2009) Chol: 140 (10/04/2009)   HDL: 42.80 (10/04/2009)   LDL: 75 (10/04/2009)   TG: 113.0 (10/04/2009)  Instructed in low sodium diet (DASH Handout) and behavior modification.    Problem # 3:  HYPERLIPIDEMIA (ICD-272.4) Assessment: Improved  Excellent. Cont Prava. His updated medication list for this problem includes:    Pravachol 40 Mg Tabs (Pravastatin sodium) ..... One tab by mouth bedtime  Labs Reviewed: SGOT: 31 (10/04/2009)   SGPT: 30 (10/04/2009)   HDL:42.80 (10/04/2009), 38.40 (09/28/2008)  LDL:75 (10/04/2009), 70 (09/28/2008)  Chol:140 (10/04/2009), 138 (09/28/2008)  Trig:113.0 (10/04/2009), 147.0 (09/28/2008)  Problem # 4:  SKIN CANCER, HX OF VERTEX OF SCALP (ICD-V10.83) Assessment: Unchanged Make appt to be seen by Derm.If needs referral, call back.  Problem # 5:  DIVERTICULOSIS, COLON (ICD-562.10) Assessment:  Unchanged RTC foer prolonged LLQ sxs.  Problem # 6:  HYPERTROPHY PROSTATE W/UR OBST & OTH LUTS (ICD-600.01) Assessment: New See instructions. Start Flomax.  Problem # 7:  CONSTIPATION, CHRONIC (ICD-564.09) Assessment: Improved Reasonably stabler...uses a laxative minimally. His updated medication list for this problem includes:    Metamucil 1.7 Gm Wafr (Psyllium) .Marland Kitchen... 1 daily by mouth  Complete Medication List: 1)  Viagra 100 Mg Tabs (Sildenafil citrate) .Marland Kitchen.. 1 hour pior relations 2)  Hypotears 1-1 % Soln (Polyethyl glycol-polyvinyl alc) .... Use as needed as needed 3)  Multivitamins Tabs (Multiple vitamin) .Marland Kitchen.. 1 daily by mouth 4)  Loratadine 10 Mg Tabs (Loratadine) .Marland Kitchen.. 1 daily by mouth as needed 5)  Nasalcrom 5.2 Mg/act Aers (Cromolyn sodium) .... Use as needed 6)  Bayer Aspirin Ec Low Dose 81 Mg Tbec (Aspirin) .Marland Kitchen.. 1 daily by mouth 7)  Pravachol 40 Mg Tabs (Pravastatin sodium) .... One tab by mouth bedtime 8)  Metamucil 1.7 Gm Wafr (Psyllium) .Marland Kitchen.. 1 daily by mouth 9)  Coq-10 100 Mg Caps (Coenzyme q10) .... Take one by mouth daily 10)  Vitamin  C 500 Mg Tabs (Ascorbic acid) .... Take one by mouth daily 11)  Flomax 0.4 Mg Caps (Tamsulosin hcl) .... One tab by mouth once daily  Patient Instructions: 1)  Start Flomax today. 2)  RTC one month with Dr Para March. If sxs better, add Proscar and 6 mos later, stop Flomacx. 3)  If no impr, stop Flomax and try Detrol, Oxybutinin, etc.  Prescriptions: FLOMAX 0.4 MG CAPS (TAMSULOSIN HCL) one tab by mouth once daily  #30 x 12   Entered and Authorized by:   Shaune Leeks MD   Signed by:   Shaune Leeks MD on 10/09/2009   Method used:   Electronically to        [facility] [address] (retail)       [phone]       [place], [place]  [address]       Ph: [phone]       Fax: [phone]   RxID:   [id number] VIAGRA 100 MG TABS (SILDENAFIL CITRATE) 1 hour pior relations  #8 x 12    Entered and Authorized by:   Shaune Leeks MD   Signed by:   Shaune Leeks MD on 10/09/2009   Method used:   Electronically to        [facility] [address] (retail)       [phone]       [place], [place]  [address]       Ph: [phone]       Fax: [phone]   RxID:   [id number]   Current Allergies (reviewed today): ! * RED DYE  Appended Document: CPX/RBH     Clinical Lists Changes  Observations: Added new observation of PSYCH COMM: Cognition and judgment appear intact. Alert and cooperative with normal attention span and concentration. No apparent delusions, illusions, hallucinations (10/09/2009 8:58) Added new observation of INGUIN NODES: No significant adenopathy (10/09/2009 8:58) Added new observation of CERVIC NODES: No lymphadenopathy noted (10/09/2009 8:58) Added new observation of SKIN SQ INSP: Intact without suspicious lesions or rashes, well healed incision on vertex of scalp in middle of bald area. Multiple AKs and SKs mostly on the arma and trunk. (10/09/2009 8:58) Added new observation of NEURO EXAM: No cranial nerve deficits noted. Station and gait are normal. Plantar reflexes are down-going bilaterally. DTRs are symmetrical throughout. Sensory, motor and coordinative functions appear intact. (10/09/2009 8:58) Added new observation of EXTREMITIES: No clubbing, cyanosis, edema, or deformity noted with normal full range of motion of all joints.   (10/09/2009 8:58) Added new observation of PEDAL PULSE: R and L carotid,radial,femoral,dorsalis pedis and posterior tibial pulses are full and equal bilaterally (10/09/2009 8:58) Added new observation of MSK EXAM: No deformity or scoliosis noted of thoracic or lumbar spine.   (10/09/2009 8:58) Added new observation of PROSTATEEXAM: Prostate gland firm and smooth, no enlargement, nodularity, tenderness, mass, asymmetry or induration. 10-20 gms. (10/09/2009 8:58) Added new  observation of GU EXAM GEN: Testes bilaterally descended without nodularity, tenderness or masses. No scrotal masses or lesions. No penis lesions or urethral discharge. (10/09/2009 8:58) Added new observation of RECTAL EXAM: No external abnormalities noted. Normal sphincter tone. No rectal masses or tenderness. Slight sclerosis at left rectal verge. G neg. (10/09/2009 8:58) Added new observation of ABDOMEN EXAM: Bowel sounds positive,abdomen soft and non-tender without masses, organomegaly or hernias noted. (10/09/2009 8:58) Added new observation of  HEART EXAM: Normal rate and regular rhythm. S1 and S2 normal without gallop, murmur, click, rub or other extra sounds. (10/09/2009 8:58) Added new observation of LUNG EXAM: Normal respiratory effort, chest expands symmetrically. Lungs are clear to auscultation, no crackles or wheezes. (10/09/2009 8:58) Added new observation of BREAST INSP: No masses or gynecomastia noted (10/09/2009 8:58) Added new observation of PE CHST WALL: No deformities, masses, tenderness or gynecomastia noted. (10/09/2009 8:58) Added new observation of NECK EXAM: No deformities, masses, or tenderness noted. (10/09/2009 8:58) Added new observation of ORAL EXAM: Oral mucosa and oropharynx without lesions or exudates.  Teeth in good repair. (10/09/2009 8:58) Added new observation of NOSE EXAM: External nasal examination shows no deformity or inflammation. Nasal mucosa are pink and moist without lesions or exudates. (10/09/2009 8:58) Added new observation of EAR EXAM: External ear exam shows no significant lesions or deformities.  Otoscopic examination reveals clear canals, tympanic membranes are intact bilaterally without bulging, retraction, inflammation or discharge. Hearing is grossly normal bilaterally. Cerumen occluding bilat, removed wiith curette, nml behind. (10/09/2009 8:58) Added new observation of EYE EXAM: Conjunctiva injected palpebrally. (10/09/2009 8:58) Added new  observation of HD/FACE INSP: Normocephalic and atraumatic without obvious abnormalities. No apparent alopecia but typical male pattern  balding. Scalp well healed with incision line of vertex.. (10/09/2009 8:58) Added new observation of PEADULT: Shaune Leeks MD ~General`Gen appear ~Head`hd/face insp ~Eyes`Eye exam ~Ears`Ear exam ~Nose`Nose exam ~Mouth`Oral exam ~Neck`NECK EXAM ~Chest Wall`PE Chst Wall ~Breasts`Breast Insp ~Lungs`lung exam ~Heart`Heart exam ~Abdomen`Abdomen exam ~Rectal`Rectal exam ~Genitalia`GU exam gen ~Prostate`ProstateExam ~Msk`MSK EXAM ~Pulses`pedal pulse ~Extremities`Extremities ~Neurologic`Neuro exam ~Skin`skin sq insp ~Cervical Nodes`cervic nodes ~Inguinal Nodes`inguin nodes ~Psych`psych comm (10/09/2009 8:58) Added new observation of GEN APPEAR: Well-developed,well-nourished,in no acute distress; alert,appropriate and cooperative throughout examination (10/09/2009 8:58)        Physical Exam  General:  Well-developed,well-nourished,in no acute distress; alert,appropriate and cooperative throughout examination Head:  Normocephalic and atraumatic without obvious abnormalities. No apparent alopecia but typical male pattern  balding. Scalp well healed with incision line of vertex.. Eyes:  Conjunctiva injected palpebrally. Ears:  External ear exam shows no significant lesions or deformities.  Otoscopic examination reveals clear canals, tympanic membranes are intact bilaterally without bulging, retraction, inflammation or discharge. Hearing is grossly normal bilaterally. Cerumen occluding bilat, removed wiith curette, nml behind. Nose:  External nasal examination shows no deformity or inflammation. Nasal mucosa are pink and moist without lesions or exudates. Mouth:  Oral mucosa and oropharynx without lesions or exudates.  Teeth in good repair. Neck:  No deformities, masses, or tenderness noted. Chest Wall:  No deformities, masses, tenderness or gynecomastia  noted. Breasts:  No masses or gynecomastia noted Lungs:  Normal respiratory effort, chest expands symmetrically. Lungs are clear to auscultation, no crackles or wheezes. Heart:  Normal rate and regular rhythm. S1 and S2 normal without gallop, murmur, click, rub or other extra sounds. Abdomen:  Bowel sounds positive,abdomen soft and non-tender without masses, organomegaly or hernias noted. Rectal:  No external abnormalities noted. Normal sphincter tone. No rectal masses or tenderness. Slight sclerosis at left rectal verge. G neg. Genitalia:  Testes bilaterally descended without nodularity, tenderness or masses. No scrotal masses or lesions. No penis lesions or urethral discharge. Prostate:  Prostate gland firm and smooth, no enlargement, nodularity, tenderness, mass, asymmetry or induration. 10-20 gms. Msk:  No deformity or scoliosis noted of thoracic or lumbar spine.   Pulses:  R and L carotid,radial,femoral,dorsalis pedis and posterior tibial pulses are full and equal bilaterally Extremities:  No  clubbing, cyanosis, edema, or deformity noted with normal full range of motion of all joints.   Neurologic:  No cranial nerve deficits noted. Station and gait are normal. Plantar reflexes are down-going bilaterally. DTRs are symmetrical throughout. Sensory, motor and coordinative functions appear intact. Skin:  Intact without suspicious lesions or rashes, well healed incision on vertex of scalp in middle of bald area. Multiple AKs and SKs mostly on the arma and trunk. Cervical Nodes:  No lymphadenopathy noted Inguinal Nodes:  No significant adenopathy Psych:  Cognition and judgment appear intact. Alert and cooperative with normal attention span and concentration. No apparent delusions, illusions, hallucinations

## 2010-06-06 NOTE — Consult Note (Signed)
Summary: [name]/Consultation Report/Dr. Quentin [name]/Consultation Report/Dr. Dawna [name]   Imported By: Mickle Asper 04/21/2007 15:33:03  _____________________________________________________________________  External Attachment:    Type:   Image     Comment:   External Document

## 2010-06-06 NOTE — Consult Note (Signed)
Summary: [facility]/Dr. Quentin Ore [facility]/Dr. Dawna Part   Imported By: Eleonore Chiquito 07/07/2007 15:04:11  _____________________________________________________________________  External Attachment:    Type:   Image     Comment:   External Document

## 2010-06-06 NOTE — Progress Notes (Signed)
Summary: ? allergic to Tamsulosin 0.4 mg.  Phone Note Call from Patient Call back at Home Phone [phone]   Caller: Patient - Walk In Call For: Shaune Leeks MD Summary of Call: Patient walked into waiting room saying that he was recently started on Tamsulosin 0.4 mg. capsules.  He has taken two capsules and his arms and hands are feeling numb and now his lower face is also feeling numb. None of these symptoms started until after he took this medication.   He has considerable problems with being allergic to things.  Please advise.  Patient is waiting in lobby. Initial call taken by: Delilah Shan CMA Duncan Dull),  October 10, 2009 4:25 PM  Follow-up for Phone Call        Have him take no more Flomax, take Claritin now and Benadryl tonite and let me know if sxs don't improve. Follow-up by: Shaune Leeks MD,  October 10, 2009 4:29 PM  Additional Follow-up for Phone Call Additional follow up Details #1::        Patient Advised.  Additional Follow-up by: Delilah Shan CMA Duncan Dull),  October 10, 2009 4:33 PM

## 2010-06-06 NOTE — Assessment & Plan Note (Signed)
      Current Allergies: ! * RED DYE        Complete Medication List: 1)  Viagra 100 Mg Tabs (Sildenafil citrate) .Marland Kitchen.. 1 hour pior relations 2)  Hypotears 1-1 % Soln (Polyethyl glycol-polyvinyl alc) .... Use as needed as needed 3)  Multivitamins Tabs (Multiple vitamin) .Marland Kitchen.. 1 daily by mouth 4)  Loratadine 10 Mg Tabs (Loratadine) .Marland Kitchen.. 1 daily by mouth as needed 5)  Nasalcrom 5.2 Mg/act Aers (Cromolyn sodium) .... Use as needed 6)  Bayer Aspirin Ec Low Dose 81 Mg Tbec (Aspirin) .Marland Kitchen.. 1 daily by mouth 7)  Pravachol 40 Mg Tabs (Pravastatin sodium) .... One tab by mouth hs 8)  Metamucil 1.7 Gm Wafr (Psyllium) .Marland Kitchen.. 1 daily by mouth 9)  Hemorrhoidal Prep 3-1:10000-2000 Oint (Skin rsp ftr-srk liv-phenylmer) .... As needed otc 10)  Coq10 100 Mg Caps (Coenzyme q10) .Marland Kitchen.. 1 daily by mouth 11)  Quercetin 250 Mg Tabs (Quercetin) .... 500 mg twice a day by mouth

## 2010-06-06 NOTE — Progress Notes (Signed)
Summary: Office Visit  Office Visit   Imported By: Carin Primrose 12/29/2006 09:58:22  _____________________________________________________________________  External Attachment:    Type:   Image     Comment:   External Document

## 2010-06-06 NOTE — Consult Note (Signed)
Summary: [facility]/Dr. Dawna Part  [facility]/Dr. Dawna Part   Imported By: Eleonore Chiquito 05/31/2007 11:31:20  _____________________________________________________________________  External Attachment:    Type:   Image     Comment:   External Document

## 2010-06-06 NOTE — Miscellaneous (Signed)
Summary: [facility] Previsit/prep  Clinical Lists Changes  Medications: Added new medication of MOVIPREP 100 GM  SOLR (PEG-KCL-NACL-NASULF-NA ASC-C) As per prep instructions. - Signed Rx of MOVIPREP 100 GM  SOLR (PEG-KCL-NACL-NASULF-NA ASC-C) As per prep instructions.;  #1 x 0;  Signed;  Entered by: Wyona Almas RN;  Authorized by: Meryl Dare MD Clementeen Graham;  Method used: Electronically to [facility] [phone] [place], [place], [place]  [address], Ph: [phone], Fax: [phone] Allergies: Added new allergy or adverse reaction of FLOMAX    Prescriptions: MOVIPREP 100 GM  SOLR (PEG-KCL-NACL-NASULF-NA ASC-C) As per prep instructions.  #1 x 0   Entered by:   Wyona Almas RN   Authorized by:   Meryl Dare MD [facility]   Signed by:   Wyona Almas RN on 10/17/2009   Method used:   Electronically to        [facility] [address] (retail)       [phone]       [place], [place]  [address]       Ph: [phone]       Fax: [phone]   RxID:   [id number]

## 2010-06-06 NOTE — Consult Note (Signed)
Summary: [facility]atology [facility]/Consultation Report/Dr. [facility]/Consultation Report/Dr. Jeanie Sewer   Imported By: Mickle Asper 02/25/2007 16:25:46  _____________________________________________________________________  External Attachment:    Type:   Image     Comment:   External Document

## 2010-06-06 NOTE — Assessment & Plan Note (Signed)
Summary: HEMORRHOID/CLE   Vital Signs:  Patient profile:   77 year old male Height:      65.50 inches Weight:      162 pounds BMI:     26.64 Temp:     97.9 degrees F oral Pulse rate:   92 / minute Pulse rhythm:   regular BP sitting:   144 / 80  (left arm) Cuff size:   regular  Vitals Entered By: Delilah Shan CMA Ami [place] Dull) (May 02, 2010 2:03 PM) CC: Hemorrhoids   History of Present Illness: Int hemorrhoids on colonoscopy.  Now with some external irritation and blood noted on toilet paper.  Sx getting better with stool softener.  No fevers.  Feeling okay o/w.  Has increased his fiber.  Asking for advice.   Allergies: 1)  ! * Red Dye 2)  ! Flomax  Social History: Occupation:Lab Designer, industrial/product- retired Married 1 Son Former Smoker Quit 1960 5pyh Alcohol use-no Drug use-no  Review of Systems       See HPI.  Otherwise negative.    Physical Exam  General:  no apparent distress rectal exam with slightly tender but resolving/nonthrombosed ext hemorrhoid.  no gross blood   Impression & Recommendations:  Problem # 1:  EXTERNAL HEMORRHOIDS (ICD-455.3) Anatomy d/w patient.  I would cont stool softener and avoid straining.  I would use the topical steroid for symptom relief in meantime.  Fu as needed.  He understood.  Orders: Prescription Created Electronically [phone])  Complete Medication List: 1)  Viagra 100 Mg Tabs (Sildenafil citrate) .Marland Kitchen.. 1 hour pior relations 2)  Hypotears 1-1 % Soln (Polyethyl glycol-polyvinyl alc) .... Use as needed as needed 3)  Multivitamins Tabs (Multiple vitamin) .Marland Kitchen.. 1 daily by mouth 4)  Loratadine 10 Mg Tabs (Loratadine) .Marland Kitchen.. 1 daily by mouth as needed 5)  Nasalcrom 5.2 Mg/act Aers (Cromolyn sodium) .... Use as needed 6)  Bayer Aspirin Ec Low Dose 81 Mg Tbec (Aspirin) .Marland Kitchen.. 1 daily by mouth 7)  Pravachol 40 Mg Tabs (Pravastatin sodium) .... One tab by mouth bedtime 8)  Metamucil 1.7 Gm Wafr (Psyllium) .Marland Kitchen.. 1 daily by mouth 9)  Coq-10 100 Mg  Caps (Coenzyme q10) .... Take one by mouth daily 10)  Vitamin C 500 Mg Tabs (Ascorbic acid) .... Take one by mouth daily 11)  Proctosol Hc 2.5 % Crea (Hydrocortisone) .... Aaa two times a day as needed  Patient Instructions: 1)  Keep taking the stool softener for now and use the cream as needed.  Try to avoid straining and then gradually cut down on the stool softener.  Take care.  Prescriptions: PROCTOSOL HC 2.5 % CREA (HYDROCORTISONE) AAA two times a day as needed  #30g x 1   Entered and Authorized by:   Crawford Givens MD   Signed by:   Crawford Givens MD on 05/02/2010   Method used:   Electronically to        [facility] [address] (retail)       [phone]       [place], [place]  [address]       Ph: [phone]       Fax: [phone]   RxID:   [id number]    Orders Added: 1)  Est. Patient Level III [02725] 2)  Prescription Created Electronically [phone]    Current Allergies (reviewed today): ! * RED DYE ! FLOMAX

## 2010-06-06 NOTE — Letter (Signed)
Summary: [facility] Instructions  [facility]  [address] [place], [place] [address]   Phone: [phone]  Fax: [phone]       Jeffrey [name]    27-Apr-1929    MRN: [id number]        Procedure Day Dorna Bloom:  [facility]  10/31/09     Arrival Time:  10:00AM     Procedure Time:  11:00AM    Location of Procedure:                    _ X_  [facility] (4th Floor)                       PREPARATION FOR COLONOSCOPY WITH MOVIPREP   Starting 5 days prior to your procedure 6/24/11do not eat nuts, seeds, popcorn, corn, beans, peas,  salads, or any raw vegetables.  Do not take any fiber supplements (e.g. Metamucil, Citrucel, and Benefiber).  THE DAY BEFORE YOUR PROCEDURE         DATE: 10/30/09  DAY: TUESDAY  1.  Drink clear liquids the entire day-NO SOLID FOOD  2.  Do not drink anything colored red or purple.  Avoid juices with pulp.  No orange juice.  3.  Drink at least 64 oz. (8 glasses) of fluid/clear liquids during the day to prevent dehydration and help the prep work efficiently.  CLEAR LIQUIDS INCLUDE: Water Jello Ice Popsicles Tea (sugar ok, no milk/cream) Powdered fruit flavored drinks Coffee (sugar ok, no milk/cream) Gatorade Juice: apple, white grape, white cranberry  Lemonade Clear bullion, consomm, broth Carbonated beverages (any kind) Strained chicken noodle soup Hard Candy                             4.  In the morning, mix first dose of MoviPrep solution:    Empty 1 Pouch A and 1 Pouch B into the disposable container    Add lukewarm drinking water to the top line of the container. Mix to dissolve    Refrigerate (mixed solution should be used within 24 hrs)  5.  Begin drinking the prep at 5:00 p.m. The MoviPrep container is divided by 4 marks.   Every 15 minutes drink the solution down to the next mark (approximately 8 oz) until the full liter is complete.   6.  Follow completed prep with 16 oz of clear liquid of your choice  (Nothing red or purple).  Continue to drink clear liquids until bedtime.  7.  Before going to bed, mix second dose of MoviPrep solution:    Empty 1 Pouch A and 1 Pouch B into the disposable container    Add lukewarm drinking water to the top line of the container. Mix to dissolve    Refrigerate  THE DAY OF YOUR PROCEDURE      DATE: 10/31/09  DAY:  WEDNESDAY  Beginning at 6:00AM (5 hours before procedure):         1. Every 15 minutes, drink the solution down to the next mark (approx 8 oz) until the full liter is complete.  2. Follow completed prep with 16 oz. of clear liquid of your choice.    3. You may drink clear liquids until 9:00AM (2 HOURS BEFORE PROCEDURE).   MEDICATION INSTRUCTIONS  Unless otherwise instructed, you should take regular prescription medications with a small sip of water   as early as possible the morning of your  procedure.            OTHER INSTRUCTIONS  You will need a responsible adult at least 75 years of age to accompany you and drive you home.   This person must remain in the waiting room during your procedure.  Wear loose fitting clothing that is easily removed.  Leave jewelry and other valuables at home.  However, you may wish to bring a book to read or  an iPod/MP3 player to listen to music as you wait for your procedure to start.  Remove all body piercing jewelry and leave at home.  Total time from sign-in until discharge is approximately 2-3 hours.  You should go home directly after your procedure and rest.  You can resume normal activities the  day after your procedure.  The day of your procedure you should not:   Drive   Make legal decisions   Operate machinery   Drink alcohol   Return to work  You will receive specific instructions about eating, activities and medications before you leave.    The above instructions have been reviewed and explained to me by   Wyona Almas RN  October 17, 2009 8:19 AM     I fully  understand and can verbalize these instructions _____________________________ Date _________

## 2010-06-06 NOTE — Assessment & Plan Note (Signed)
Summary: F/U FOR BP CHECK / LFW   Vital Signs:  Patient profile:   77 year old male Height:      65.50 inches Weight:      156 pounds Temp:     97.5 degrees F oral Pulse rate:   60 / minute Pulse rhythm:   regular Resp:     18 per minute BP sitting:   120 / 80  (left arm) Cuff size:   regular  Vitals Entered By: Providence Crosby LPN (January 03, 2009 9:02 AM) CC: 3 month followup check bp   History of Present Illness: Pt here for BP check. Is having some difficuties with abdominal discomfort which he thinks might be from too much roughage...having pressure on the diaphragm. He burps some. He has used Mylanta wafers with good response. Overall he feel well.  Problems Prior to Update: 1)  Cerumen Impaction, Bilateral  (ICD-380.4) 2)  Elevated Blood Pressure Without Diagnosis of Hypertension  (ICD-796.2) 3)  Transient Ischemic Attack  (ICD-435.9) 4)  Colonic Polyps, Hx of  (ICD-V12.72) 5)  Elevated Prostate Specific Antigen  (ICD-790.93) 6)  Skin Cancer, Hx of Vertex of Scalp  (ICD-V10.83) 7)  Hyperlipidemia  (ICD-272.4) 8)  Diverticulosis, Colon  (ICD-562.10)  Medications Prior to Update: 1)  Viagra 100 Mg Tabs (Sildenafil Citrate) .Marland Kitchen.. 1 Hour Pior Relations 2)  Hypotears 1-1 %  Soln (Polyethyl Glycol-Polyvinyl Alc) .... Use As Needed As Needed 3)  Multivitamins   Tabs (Multiple Vitamin) .Marland Kitchen.. 1 Daily By Mouth 4)  Loratadine 10 Mg  Tabs (Loratadine) .Marland Kitchen.. 1 Daily By Mouth As Needed 5)  Nasalcrom 5.2 Mg/act  Aers (Cromolyn Sodium) .... Use As Needed 6)  Bayer Aspirin Ec Low Dose 81 Mg  Tbec (Aspirin) .Marland Kitchen.. 1 Daily By Mouth 7)  Pravachol 40 Mg  Tabs (Pravastatin Sodium) .... One Tab By Mouth Hs 8)  Metamucil 1.7 Gm Wafr (Psyllium) .Marland Kitchen.. 1 Daily By Mouth  Allergies: 1)  ! * Red Dye  Physical Exam  General:  Well-developed,well-nourished,in no acute distress; alert,appropriate and cooperative throughout examination Head:  Normocephalic and atraumatic without obvious  abnormalities. No apparent alopecia but typical male pattern  balding. Scalp well healed but dect along incision line. Eyes:  Conjunctiva injected palpebrally. Ears:  External ear exam shows no significant lesions or deformities.  Otoscopic examination reveals clear canals, tympanic membranes are intact bilaterally without bulging, retraction, inflammation or discharge. Hearing is grossly normal bilaterally. Cerumen occluding bilat, removed wiith curette, nml behind. Nose:  External nasal examination shows no deformity or inflammation. Nasal mucosa are pink and moist without lesions or exudates. Mouth:  Oral mucosa and oropharynx without lesions or exudates.  Teeth in good repair. Neck:  No deformities, masses, or tenderness noted. Lungs:  Normal respiratory effort, chest expands symmetrically. Lungs are clear to auscultation, no crackles or wheezes. Heart:  Normal rate and regular rhythm. S1 and S2 normal without gallop, murmur, click, rub or other extra sounds.   Impression & Recommendations:  Problem # 1:  ELEVATED BLOOD PRESSURE WITHOUT DIAGNOSIS OF HYPERTENSION (ICD-796.2) Assessment Improved  Nos nml. Cont as is, avoid salt.  BP today: 120/80   130/80 by me. Prior BP: 140/60 (10/03/2008)  Labs Reviewed: Creat: 1.0 (09/28/2008) Chol: 138 (09/28/2008)   HDL: 38.40 (09/28/2008)   LDL: 70 (09/28/2008)   TG: 147.0 (09/28/2008)  Instructed in low sodium diet (DASH Handout) and behavior modification.    Problem # 2:  CONSTIPATION, CHRONIC (ICD-564.09) Assessment: Improved  Better with roughage  and Metamucil. Change to Citrucel to avoid as much gas. Use Mylanta as directed. His updated medication list for this problem includes:    Metamucil 1.7 Gm Wafr (Psyllium) .Marland Kitchen... 1 daily by mouth  Discussed dietary fiber measures and increased water intake.   Problem # 3:  SKIN CANCER, HX OF VERTEX OF SCALP (ICD-V10.83) Assessment: Unchanged Well healed.  Complete Medication List: 1)   Viagra 100 Mg Tabs (Sildenafil citrate) .Marland Kitchen.. 1 hour pior relations 2)  Hypotears 1-1 % Soln (Polyethyl glycol-polyvinyl alc) .... Use as needed as needed 3)  Multivitamins Tabs (Multiple vitamin) .Marland Kitchen.. 1 daily by mouth 4)  Loratadine 10 Mg Tabs (Loratadine) .Marland Kitchen.. 1 daily by mouth as needed 5)  Nasalcrom 5.2 Mg/act Aers (Cromolyn sodium) .... Use as needed 6)  Bayer Aspirin Ec Low Dose 81 Mg Tbec (Aspirin) .Marland Kitchen.. 1 daily by mouth 7)  Pravachol 40 Mg Tabs (Pravastatin sodium) .... One tab by mouth hs 8)  Metamucil 1.7 Gm Wafr (Psyllium) .Marland Kitchen.. 1 daily by mouth  Patient Instructions: 1)  RTC 6/11 for Comp Exam, labs prior Prescriptions: VIAGRA 100 MG TABS (SILDENAFIL CITRATE) 1 hour pior relations  #8 x 12   Entered and Authorized by:   Shaune Leeks MD   Signed by:   Shaune Leeks MD on 01/03/2009   Method used:   Electronically to        [facility] [address] (retail)       [phone]       [place], [place]  [address]       Ph: [phone]       Fax: [phone]   RxID:   [id number]

## 2010-06-06 NOTE — Letter (Signed)
Summary: Previsit letter  [facility]  [address] [place], [place] [address]   Phone: [phone]  Fax: [phone]       09/20/2009 MRN: [id number]  Jeffrey [name] [address] [place], [place]  [address]  Dear Jeffrey [name],  Welcome to the Gastroenterology Division at [facility].    You are scheduled to see a nurse for your pre-procedure visit on 10-17-09 at 8:00a.m. on the 3rd floor at [facility], [address]  We ask that you try to arrive at our office 15 minutes prior to your appointment time to allow for check-in.  Your nurse visit will consist of discussing your medical and surgical history, your immediate family medical history, and your medications.    Please bring a complete list of all your medications or, if you prefer, bring the medication bottles and we will list them.  We will need to be aware of both prescribed and over the counter drugs.  We will need to know exact dosage information as well.  If you are on blood thinners (Coumadin, Plavix, Aggrenox, Ticlid, etc.) please call our office today/prior to your appointment, as we need to consult with your physician about holding your medication.   Please be prepared to read and sign documents such as consent forms, a financial agreement, and acknowledgement forms.  If necessary, and with your consent, a friend or relative is welcome to sit-in on the nurse visit with you.  Please bring your insurance card so that we may make a copy of it.  If your insurance requires a referral to see a specialist, please bring your referral form from your primary care physician.  No co-pay is required for this nurse visit.     If you cannot keep your appointment, please call [phone] to cancel or reschedule prior to your appointment date.  This allows [place] the opportunity to schedule an appointment for another patient in need of care.    Thank you for choosing [facility] for your medical  needs.  We appreciate the opportunity to care for you.  Please visit [place] at our website  to learn more about our practice.                     Sincerely.                                                                                                                   The Gastroenterology Division

## 2010-06-06 NOTE — Assessment & Plan Note (Signed)
Summary: cpx/bir   Vital Signs:  Patient Profile:   77 Years Old Male Height:     65.50 inches Weight:      157 pounds Temp:     96.7 degrees F tympanic Pulse rate:   76 / minute Pulse rhythm:   regular BP sitting:   130 / 70  (left arm) Cuff size:   regular  Vitals Entered By: Providence Crosby (August 24, 2007 8:29 AM)                 Chief Complaint:  check up.  History of Present Illness: Has had significant plastic surgery to cover 2x2 defect in thre vertex of his posterior scalp which is healing well...done at [facility]. He is here today fo eval, no real complaints. His only complaint is a past TIA with slurred speech for 5-10 mins 02/08/07 with susequent plastic surgery 05/12/07 with EKG and has no other sxs or events since the 10/6 episode. He has never had Echo or Carotid U/S. He started 81mg  EASA on 06/09/07.    Prior Medications Reviewed Using: List Brought by Patient  Current Allergies (reviewed today): ! * RED DYE   Family History:    Father dec 87 Rectal Ca at 80yoa Valve Leaks    Mother dec 88 NH fell-coma    Brother dec 73 NH    Sister A 59    Risk Factors:  Passive smoke exposure:  no HIV high-risk behavior:  no Caffeine use:  0 drinks per day Exercise:  yes    Times per week:  5    Type:  light weights, walks Seatbelt use:  100 %   Review of Systems  General      Denies chills, fatigue, fever, loss of appetite, malaise, sleep disorder, sweats, weakness, and weight loss.  Eyes      Denies blurring, discharge, double vision, eye irritation, eye pain, halos, itching, light sensitivity, red eye, vision loss-1 eye, and vision loss-both eyes.  ENT      Complains of ringing in ears.      Denies decreased hearing, difficulty swallowing, ear discharge, earache, hoarseness, nasal congestion, nosebleeds, postnasal drainage, sinus pressure, and sore throat.  CV      Denies bluish discoloration of lips or nails, chest pain or discomfort, difficulty  breathing at night, difficulty breathing while lying down, fainting, fatigue, leg cramps with exertion, lightheadness, near fainting, palpitations, shortness of breath with exertion, swelling of feet, swelling of hands, and weight gain.  Resp      Denies chest discomfort, chest pain with inspiration, cough, coughing up blood, excessive snoring, hypersomnolence, morning headaches, pleuritic, shortness of breath, sputum productive, and wheezing.  GI      Complains of bloody stools.      Denies abdominal pain, change in bowel habits, constipation, dark tarry stools, diarrhea, excessive appetite, gas, hemorrhoids, indigestion, loss of appetite, nausea, vomiting, vomiting blood, and yellowish skin color.      mild with occas constipation  GU      Denies decreased libido, discharge, dysuria, erectile dysfunction, genital sores, hematuria, incontinence, nocturia, urinary frequency, and urinary hesitancy.  MS      Denies joint pain, joint redness, joint swelling, loss of strength, low back pain, mid back pain, muscle aches, muscle , cramps, muscle weakness, stiffness, and thoracic pain.  Derm      Denies changes in color of skin, changes in nail beds, dryness, excessive perspiration, flushing, hair loss, insect bite(s), itching, lesion(s), poor wound  healing, and rash.      rare acne  Neuro      Denies brief paralysis, difficulty with concentration, disturbances in coordination, falling down, headaches, inability to speak, memory loss, numbness, poor balance, seizures, sensation of room spinning, tingling, tremors, visual disturbances, and weakness.      had short instance of slurred speeck lasting 5-10 mins in Oct, no recurrence.   Physical Exam  General:     Well-developed,well-nourished,in no acute distress; alert,appropriate and cooperative throughout examination Head:     Normocephalic and atraumatic without obvious abnormalities. No apparent alopecia or balding. Eyes:     Conjunctiva  clear bilaterally.  Ears:     External ear exam shows no significant lesions or deformities.  Otoscopic examination reveals clear canals, tympanic membranes are intact bilaterally without bulging, retraction, inflammation or discharge. Hearing is grossly normal bilaterally.  Mild cerumen bilat. Nose:     External nasal examination shows no deformity or inflammation. Nasal mucosa are pink and moist without lesions or exudates. Mouth:     Oral mucosa and oropharynx without lesions or exudates.  Teeth in good repair.    Impression & Recommendations:  Problem # 1:  TRANSIENT ISCHEMIC ATTACK (ICD-435.9) Assessment: New Initial episode 02/08/07 with no furthe rsxs, has since started EASA81mg ...cont. Will do Carotid and Echo. RTC if recurs. His updated medication list for this problem includes:    Bayer Aspirin Ec Low Dose 81 Mg Tbec (Aspirin) .Marland Kitchen... 1 daily by mouth  Orders: Cardiology Referral (Cardiology)   Problem # 2:  SKIN CANCER, HX OF VERTEX OF SCALP (ICD-V10.83) Assessment: Improved Healing.  Problem # 3:  ELEVATED PROSTATE SPECIFIC ANTIGEN (ICD-790.93) Assessment: Unchanged Stable.  Problem # 4:  HYPERLIPIDEMIA (ICD-272.4) Assessment: Unchanged  His updated medication list for this problem includes:    Pravachol 40 Mg Tabs (Pravastatin sodium) ..... One tab by mouth hs  Labs Reviewed: Chol: 204 (08/19/2007)   HDL: 33.3 (08/19/2007)   LDL: DEL (08/19/2007)   TG: 160 (08/19/2007) SGOT: 27 (08/19/2007)   SGPT: 23 (08/19/2007)   Problem # 5:  DIVERTICULOSIS, COLON (ICD-562.10) Assessment: Unchanged Discussed.  Complete Medication List: 1)  Viagra 100 Mg Tabs (Sildenafil citrate) 2)  Hypotears 1-1 % Soln (Polyethyl glycol-polyvinyl alc) .... Use as needed as needed 3)  Multivitamins Tabs (Multiple vitamin) .Marland Kitchen.. 1 daily by mouth 4)  Loratadine 10 Mg Tabs (Loratadine) .Marland Kitchen.. 1 daily by mouth as needed 5)  Nasalcrom 5.2 Mg/act Aers (Cromolyn sodium) .... Use as needed 6)   Bayer Aspirin Ec Low Dose 81 Mg Tbec (Aspirin) .Marland Kitchen.. 1 daily by mouth 7)  Pravachol 40 Mg Tabs (Pravastatin sodium) .... One tab by mouth hs   Patient Instructions: 1)  SGOT, SGPT 272. 4   6 WEEKS 2)  See me in 3 mos, SGOT, SGPT, CHOL PROFILE  272. 4    prior  3)  Refer for ECHO and Carotid U/S.    Prescriptions: PRAVACHOL 40 MG  TABS (PRAVASTATIN SODIUM) one tab by mouth Hs  #30 x 12   Entered and Authorized by:   Shaune Leeks MD   Signed by:   Shaune Leeks MD on 08/24/2007   Method used:   Print then Give to Patient   RxID:   [id number]  ]

## 2010-06-06 NOTE — Progress Notes (Signed)
Summary: update on condition  Phone Note Call from Patient Call back at Home Phone [phone]   Caller: Spouse Call For: Shaune Leeks MD Summary of Call: Pt's wife called to report on how he is doing today. He had walked in yesterday and was told to stop taking flomax.   The sxs that he had yesterday- tingling in his arms and hands- are gone.  He feels weak and tired today, but otherwise ok. Initial call taken by: Lowella Petties CMA,  October 11, 2009 8:53 AM  Follow-up for Phone Call        Noted. Tried to call and got no answer. Follow-up by: Shaune Leeks MD,  October 11, 2009 5:43 PM

## 2010-06-06 NOTE — Letter (Signed)
Summary: Colonoscopy Letter  [facility]  [address] [place], [place] [address]   Phone: [phone]  Fax: [phone]      Sep 18, 2009 MRN: [id number]   Jeffrey [name] [address] [place], [place]  [address]   Dear [name],   According to your medical record, it is time for you to schedule a Colonoscopy. The American Cancer Society recommends this procedure as a method to detect early colon cancer. Patients with a family history of colon cancer, or a personal history of colon polyps or inflammatory bowel disease are at increased risk.  This letter has beeen generated based on the recommendations made at the time of your procedure. If you feel that in your particular situation this may no longer apply, please contact our office.  Please call our office at [phone] to schedule this appointment or to update your records at your earliest convenience.  Thank you for cooperating with [place] to provide you with the very best care possible.   Sincerely,  Jeffrey [name], M.D.  [facility] Gastroenterology Division [phone]

## 2010-06-06 NOTE — Progress Notes (Signed)
Summary: prep ?'s   Phone Note Call from Patient Call back at Home Phone [phone]   Caller: wife, Aurea Graff Call For: Dr. Russella Dar Reason for Call: Talk to Nurse Summary of Call: prep ?'s Initial call taken by: Vallarie Mare,  October 26, 2009 4:11 PM  Follow-up for Phone Call        Spoke with wife Aurea Graff, answered questions about diet for the 5 days of no nuts,seeds,etc. She seems to understand and agree with me, no further questions at this time. she was very nice and thanked me! Follow-up by: Sherren Kerns RN,  October 26, 2009 4:23 PM

## 2010-06-06 NOTE — Assessment & Plan Note (Signed)
Summary: 5 MONTH FOLLOW UP / LFW   Vital Signs:  Patient Profile:   77 Years Old Male Height:     65.50 inches Weight:      160 pounds Temp:     97.4 degrees F oral Pulse rate:   76 / minute Pulse rhythm:   regular BP sitting:   120 / 70  (left arm) Cuff size:   regular  Vitals Entered ByProvidence Crosby (May 01, 2008 8:44 AM)                 Chief Complaint:  3 month followup.  History of Present Illness: Pt here for 5 mos followup, doing well with no complaints. Had muscle aches a couple weeks ago but is improved. Was due to trying to start Lawnmower which he has had worked on. Using metamucil cookie during day and Prep H at night with rectum greatly improved.    Prior Medications Reviewed Using: Patient Recall  Current Allergies (reviewed today): ! * RED DYE      Physical Exam  General:     Well-developed,well-nourished,in no acute distress; alert,appropriate and cooperative throughout examination Head:     Normocephalic and atraumatic without obvious abnormalities. No apparent alopecia or balding. Scalp well healed but dect along incision line. Eyes:     Conjunctiva clear bilaterally.  Ears:     External ear exam shows no significant lesions or deformities.  Otoscopic examination reveals clear canals, tympanic membranes are intact bilaterally without bulging, retraction, inflammation or discharge. Hearing is grossly normal bilaterally. Nose:     External nasal examination shows no deformity or inflammation. Nasal mucosa are pink and moist without lesions or exudates. Mouth:     Oral mucosa and oropharynx without lesions or exudates.  Teeth in good repair. Neck:     No deformities, masses, or tenderness noted. Chest Wall:     No deformities, masses, tenderness or gynecomastia noted. Lungs:     Normal respiratory effort, chest expands symmetrically. Lungs are clear to auscultation, no crackles or wheezes. Heart:     Normal rate and regular rhythm. S1  and S2 normal without gallop, murmur, click, rub or other extra sounds. Abdomen:     Bowel sounds positive,abdomen soft and non-tender without masses, organomegaly or hernias noted.    Impression & Recommendations:  Problem # 1:  TRANSIENT ISCHEMIC ATTACK (ICD-435.9) Assessment: Improved Stable. His updated medication list for this problem includes:    Bayer Aspirin Ec Low Dose 81 Mg Tbec (Aspirin) .Marland Kitchen... 1 daily by mouth   Complete Medication List: 1)  Viagra 100 Mg Tabs (Sildenafil citrate) .Marland Kitchen.. 1 hour pior relations 2)  Hypotears 1-1 % Soln (Polyethyl glycol-polyvinyl alc) .... Use as needed as needed 3)  Multivitamins Tabs (Multiple vitamin) .Marland Kitchen.. 1 daily by mouth 4)  Loratadine 10 Mg Tabs (Loratadine) .Marland Kitchen.. 1 daily by mouth as needed 5)  Nasalcrom 5.2 Mg/act Aers (Cromolyn sodium) .... Use as needed 6)  Bayer Aspirin Ec Low Dose 81 Mg Tbec (Aspirin) .Marland Kitchen.. 1 daily by mouth 7)  Pravachol 40 Mg Tabs (Pravastatin sodium) .... One tab by mouth hs 8)  Metamucil 1.7 Gm Wafr (Psyllium) .Marland Kitchen.. 1 daily by mouth 9)  Hemorrhoidal Prep 3-1:10000-2000 Oint (Skin rsp ftr-srk liv-phenylmer) .... As needed otc   Patient Instructions: 1)  RTC early May for Comp Exam, labs prior    ]

## 2010-06-06 NOTE — Consult Note (Signed)
Summary: [facility] / DR. PHILLIP WILLIFORD  [facility] / DR. PHILLIP WILLIFORD   Imported By: Carin Primrose 05/27/2007 15:20:06  _____________________________________________________________________  External Attachment:    Type:   Image     Comment:   External Document

## 2010-06-06 NOTE — Consult Note (Signed)
Summary: [facility]/Plastic & Reconstructive Surgery/Dr.   [facility]/Plastic & Reconstructive Surgery/Dr. Dawna Part   Imported By: Eleonore Chiquito 11/29/2007 11:35:31  _____________________________________________________________________  External Attachment:    Type:   Image     Comment:   External Document

## 2010-06-06 NOTE — Progress Notes (Signed)
Summary: [facility]/ Sharlet Salina  [facility]/ Sharlet Salina MD   Imported By: Eleonore Chiquito 02/23/2007 09:22:39  _____________________________________________________________________  External Attachment:    Type:   Image     Comment:   External Document

## 2010-06-06 NOTE — Assessment & Plan Note (Signed)
Summary: 3 m f/u  dlo   Vital Signs:  Patient Profile:   77 Years Old Male Height:     65.50 inches Weight:      154 pounds Temp:     97.5 degrees F oral Pulse rate:   76 / minute Pulse rhythm:   regular BP sitting:   114 / 60  (left arm) Cuff size:   regular  Vitals Entered By: Providence Crosby (November 25, 2007 8:03 AM)                 Chief Complaint:  3 month followup.  History of Present Illness: Pt here for followup. Had lots of gas when seen last time, thought associated with 81mg  ASA. Has started using Metamucil cookies. Has also been using Mylanta at times. Had ECHO  and Carotid U/S to f/u TIA and both were nml. Is here for recheck of Chol after starting on Pravachol which he is tolerating well.    Prior Medications Reviewed Using: Patient Recall  Current Allergies (reviewed today): ! * RED DYE      Physical Exam  General:     Well-developed,well-nourished,in no acute distress; alert,appropriate and cooperative throughout examination Head:     Normocephalic and atraumatic without obvious abnormalities. No apparent alopecia or balding. Eyes:     Conjunctiva clear bilaterally.  Ears:     External ear exam shows no significant lesions or deformities.  Otoscopic examination reveals clear canals, tympanic membranes are intact bilaterally without bulging, retraction, inflammation or discharge. Hearing is grossly normal bilaterally. Nose:     External nasal examination shows no deformity or inflammation. Nasal mucosa are pink and moist without lesions or exudates. Mouth:     Oral mucosa and oropharynx without lesions or exudates.  Teeth in good repair. Neck:     No deformities, masses, or tenderness noted. Chest Wall:     No deformities, masses, tenderness or gynecomastia noted. Lungs:     Normal respiratory effort, chest expands symmetrically. Lungs are clear to auscultation, no crackles or wheezes. Heart:     Normal rate and regular rhythm. S1 and S2 normal  without gallop, murmur, click, rub or other extra sounds.    Impression & Recommendations:  Problem # 1:  HYPERLIPIDEMIA (ICD-272.4) Assessment: Improved Great response to Pravastatin...continue. His updated medication list for this problem includes:    Pravachol 40 Mg Tabs (Pravastatin sodium) ..... One tab by mouth hs  Labs Reviewed: Chol: 133 (11/19/2007)   HDL: 38.9 (11/19/2007)   LDL: 71 (11/19/2007)   TG: 117 (11/19/2007) SGOT: 32 (11/19/2007)   SGPT: 34 (11/19/2007)   Problem # 2:  TRANSIENT ISCHEMIC ATTACK (ICD-435.9) Assessment: Unchanged Stable, ECHO and Carotid U/S nml. Cont ASA. His updated medication list for this problem includes:    Bayer Aspirin Ec Low Dose 81 Mg Tbec (Aspirin) .Marland Kitchen... 1 daily by mouth   Problem # 3:  SKIN CANCER, HX OF VERTEX OF SCALP (ICD-V10.83) Assessment: Improved Plastic surgery has healed well.  Complete Medication List: 1)  Viagra 100 Mg Tabs (Sildenafil citrate) .Marland Kitchen.. 1 hour pior relations 2)  Hypotears 1-1 % Soln (Polyethyl glycol-polyvinyl alc) .... Use as needed as needed 3)  Multivitamins Tabs (Multiple vitamin) .Marland Kitchen.. 1 daily by mouth 4)  Loratadine 10 Mg Tabs (Loratadine) .Marland Kitchen.. 1 daily by mouth as needed 5)  Nasalcrom 5.2 Mg/act Aers (Cromolyn sodium) .... Use as needed 6)  Bayer Aspirin Ec Low Dose 81 Mg Tbec (Aspirin) .Marland Kitchen.. 1 daily by mouth 7)  Pravachol  40 Mg Tabs (Pravastatin sodium) .... One tab by mouth hs   Patient Instructions: 1)  RTC 5 mos 2)  Use Mylanta as needed gas.   ]

## 2010-06-06 NOTE — Procedures (Signed)
Summary: Gastroenterology  Gastroenterology   Imported By: Beau Fanny 10/15/2006 12:35:43  _____________________________________________________________________  External Attachment:    Type:   Image     Comment:   External Document

## 2010-06-12 NOTE — Progress Notes (Signed)
Summary: regarding hemorrhoids  Phone Note Call from Patient Call back at Home Phone [phone]   Caller: Patient Summary of Call: Pt continues to have problems with hemorrhoids.  He is using proctofoam, stool softeners.  He asks if there is something more that he should be doing that would help the problem.  I suggested that he might want to see a surgeon, but he is asking if there is a better alternative.  Please advise. Initial call taken by: Lowella Petties CMA, AAMA,  June 03, 2010 12:57 PM  Follow-up for Phone Call        If he is using stool softeners, avoiding straining, using the topical steroids and continues with symptoms, then it would be reasonable to talk with surgical clinic about banding or surgery.  I will defer to patient.  I put in the referral, but it is on hold.  If he wants to proceed, then please tell Shirlee Limerick to proceed.  o/w cancel the referral.  thanks.  Follow-up by: Crawford Givens MD,  June 03, 2010 1:33 PM  Additional Follow-up for Phone Call Additional follow up Details #1::        Patient prefers someone in [place].  If he is not home, please leave message on recorder.  Lugene Fuquay CMA (AAMA)  June 03, 2010 2:58 PM

## 2010-07-09 ENCOUNTER — Encounter: Payer: Self-pay | Admitting: Family Medicine

## 2010-07-19 ENCOUNTER — Ambulatory Visit: Payer: Medicare Other | Admitting: Family Medicine

## 2010-07-19 ENCOUNTER — Encounter: Payer: Self-pay | Admitting: Family Medicine

## 2010-07-19 ENCOUNTER — Telehealth: Payer: Self-pay | Admitting: Family Medicine

## 2010-07-19 DIAGNOSIS — K5909 Other constipation: Secondary | ICD-10-CM

## 2010-07-19 DIAGNOSIS — R21 Rash and other nonspecific skin eruption: Secondary | ICD-10-CM

## 2010-07-19 DIAGNOSIS — R030 Elevated blood-pressure reading, without diagnosis of hypertension: Secondary | ICD-10-CM

## 2010-07-21 ENCOUNTER — Emergency Department (HOSPITAL_COMMUNITY)
Admission: EM | Admit: 2010-07-21 | Discharge: 2010-07-21 | Disposition: A | Discharge status: Home / Self Care | Payer: Medicare Other | Attending: Emergency Medicine | Admitting: Emergency Medicine

## 2010-07-21 DIAGNOSIS — E7800 Pure hypercholesterolemia, unspecified: Secondary | ICD-10-CM | POA: Insufficient documentation

## 2010-07-21 DIAGNOSIS — R21 Rash and other nonspecific skin eruption: Secondary | ICD-10-CM | POA: Insufficient documentation

## 2010-07-21 LAB — APTT: aPTT: 32 seconds (ref 24–37)

## 2010-07-21 LAB — CBC
HCT: 45 % (ref 39.0–52.0)
Hemoglobin: 16.1 g/dL (ref 13.0–17.0)
MCH: 32.3 pg (ref 26.0–34.0)
MCHC: 35.8 g/dL (ref 30.0–36.0)
MCV: 90.2 fL (ref 78.0–100.0)
Platelets: 152 10*3/uL (ref 150–400)
RBC: 4.99 MIL/uL (ref 4.22–5.81)
RDW: 12.2 % (ref 11.5–15.5)
WBC: 4.7 10*3/uL (ref 4.0–10.5)

## 2010-07-21 LAB — DIFFERENTIAL
Basophils Absolute: 0.1 10*3/uL (ref 0.0–0.1)
Basophils Relative: 1 % (ref 0–1)
Eosinophils Absolute: 0 10*3/uL (ref 0.0–0.7)
Eosinophils Relative: 1 % (ref 0–5)
Lymphocytes Relative: 33 % (ref 12–46)
Lymphs Abs: 1.5 10*3/uL (ref 0.7–4.0)
Monocytes Absolute: 0.4 10*3/uL (ref 0.1–1.0)
Monocytes Relative: 9 % (ref 3–12)
Neutro Abs: 2.6 10*3/uL (ref 1.7–7.7)
Neutrophils Relative %: 56 % (ref 43–77)

## 2010-07-21 LAB — PROTIME-INR
INR: 1.08 (ref 0.00–1.49)
Prothrombin Time: 14.2 seconds (ref 11.6–15.2)

## 2010-07-22 ENCOUNTER — Encounter: Payer: Self-pay | Admitting: Family Medicine

## 2010-07-22 ENCOUNTER — Other Ambulatory Visit: Payer: Self-pay | Admitting: Family Medicine

## 2010-07-22 ENCOUNTER — Ambulatory Visit (INDEPENDENT_AMBULATORY_CARE_PROVIDER_SITE_OTHER): Payer: Medicare Other | Admitting: Family Medicine

## 2010-07-22 DIAGNOSIS — R21 Rash and other nonspecific skin eruption: Secondary | ICD-10-CM

## 2010-07-22 LAB — BASIC METABOLIC PANEL
BUN: 14 mg/dL (ref 6–23)
CO2: 28 mEq/L (ref 19–32)
Calcium: 9.3 mg/dL (ref 8.4–10.5)
Chloride: 103 mEq/L (ref 96–112)
Creatinine, Ser: 1 mg/dL (ref 0.4–1.5)
GFR: 74.79 mL/min (ref >60.00)
Glucose, Bld: 85 mg/dL (ref 70–99)
Potassium: 4.5 mEq/L (ref 3.5–5.1)
Sodium: 136 mEq/L (ref 135–145)

## 2010-07-22 LAB — SEDIMENTATION RATE: Sed Rate: 6 mm/hr (ref 0–22)

## 2010-07-22 LAB — HEPATIC FUNCTION PANEL
ALT: 34 U/L (ref 0–53)
AST: 35 U/L (ref 0–37)
Albumin: 4.6 g/dL (ref 3.5–5.2)
Alkaline Phosphatase: 50 U/L (ref 39–117)
Bilirubin, Direct: 0.1 mg/dL (ref 0.0–0.3)
Total Bilirubin: 0.6 mg/dL (ref 0.3–1.2)
Total Protein: 6.9 g/dL (ref 6.0–8.3)

## 2010-07-22 LAB — TSH: TSH: 1.32 u[IU]/mL (ref 0.35–5.50)

## 2010-07-23 LAB — ANA: Anti Nuclear Antibody(ANA): NEGATIVE

## 2010-07-23 NOTE — Progress Notes (Signed)
Summary: rash on both lower legs  Phone Note Call from Patient Call back at [phone]   Caller: Spouse Call For: Dr Para March Summary of Call: Two days ago rash started on both legs Pt has been using Cetaphil lotion and Aloe vera. Today rash is much worse fine rash under the skin with redness on both lower legs and feels warm to touch. No change of soap, or detergent etc. Pt uses [facility] on [address]. Can reach  pt at [phone]. No available appts today. Please advise.  Initial call taken by: Lewanda Rife LPN,  July 19, 2010 11:47 AM  Follow-up for Phone Call        Pt needs to be seen, possibly at [facility].  I can't rx for this w/o having seen that patient.  Crawford Givens MD  July 19, 2010 1:11 PM   Wife advised. I told her about [facility], she says that she is going to take him there.  Follow-up by: Melody Comas,  July 19, 2010 1:41 PM

## 2010-07-23 NOTE — Assessment & Plan Note (Signed)
Summary: RASH ON BOTH LEGS   Vital Signs:  Patient Profile:   77 Years Old Male CC:      Rash on legs Height:     65.50 inches O2 Sat:      96 % O2 treatment:    Room Air Temp:     97.9 degrees F oral Pulse rate:   79 / minute Pulse rhythm:   regular Resp:     14 per minute BP sitting:   154 / 89  (left arm)  Pt. in pain?   no  Vitals Entered By: Standley Dakins MD (July 19, 2010 3:28 PM)                   Current Allergies (reviewed today): ! * RED DYE ! FLOMAXHistory of Present Illness History from: patient Reason for visit: see chief complaint Chief Complaint: Rash on legs History of Present Illness: The patient presented today for evaluation of a rash that has been present on the lower legs for about 2 days and seems to be getting worse.  There has been no itching and  he has had it before but is reporting that he used to use topical aloe vera creme and topical cetaphil creme and then it gets better but today he noticed that it has gotten worse by spreading over the lower part of the leg.  He says that he is not using any new detergents or taking  any new medications.  He says that he is having constipation and taking Miralax but no other symptoms.  He says that he ate some sausage this morning at [facility] but does not have a history of HTN and normally has low BP.  He says that he is under stress at home because of having to put a new septic tank in the yard.  The rash does not itch except when it gets very dry.  The rash had started to improve some after applying some aloe vera creme and then seemed to spread later on in the day.  Pt has not been traveling recently.    REVIEW OF SYSTEMS Constitutional Symptoms      Denies fever, chills, night sweats, weight loss, weight gain, and fatigue.  Eyes       Denies change in vision, eye pain, eye discharge, glasses, contact lenses, and eye surgery.      Comments: dry eyes Ear/Nose/Throat/Mouth       Denies hearing loss/aids,  change in hearing, ear pain, ear discharge, dizziness, frequent runny nose, frequent nose bleeds, sinus problems, sore throat, hoarseness, and tooth pain or bleeding.  Respiratory       Denies dry cough, productive cough, wheezing, shortness of breath, asthma, bronchitis, and emphysema/COPD.  Cardiovascular       Denies murmurs, chest pain, and tires easily with exhertion.    Gastrointestinal       Denies stomach pain, nausea/vomiting, diarrhea, constipation, blood in bowel movements, and indigestion. Genitourniary       Denies painful urination, kidney stones, and loss of urinary control. Neurological       Denies paralysis, seizures, and fainting/blackouts. Musculoskeletal       Denies muscle pain, joint pain, joint stiffness, decreased range of motion, redness, swelling, muscle weakness, and gout.  Skin       Denies bruising, unusual mles/lumps or sores, and hair/skin or nail changes.  Psych       Denies mood changes, temper/anger issues, anxiety/stress, speech problems, depression, and sleep problems.  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 Diverticulosis, colon (09/21/2000) Hyperlipidemia (05/05/1988) Skin cancer, hx of  Scalp  (05/05/1998)  Past Surgical History: Reviewed history from 10/03/2008 and no changes required. Septoplasty 1995 Colonoscopy Polyps B9 08/1993 Colonoscopy Polyps B9  09/21/2000 Colonoscopy Polyps B9 10/05/2003 Melanoma Scalp 07/2003 Colonoscopy Polyps Divertics int Hemms 10/05/2003        Colonoscopy Polyps B9 Int Hemms       next 2011 ECHO NML LVF EF 50-55% Mild Diast Dysfctn Triv AR  09/08/2007 Carotid U/S NML 09/08/2007 Skin revision vertex of scalp, (Dr Dawna Part, Pollyann Savoy) 2009  Family History: Reviewed history from 10/09/2009 and no changes required. Father dec 87 Rectal Ca at 80yoa Valve Leaks Mother dec 88 NH fell-coma Brother dec 73 NH Sister A 41   Social History: Reviewed history from 05/02/2010 and no changes required. Occupation:Lab [employer]- retired Married 1 Son Former Smoker Quit 1960 5pyh Alcohol use-no Drug use-no Physical Exam General appearance: well developed, well nourished, no acute distress Head: normocephalic, atraumatic Eyes: conjunctivae and lids normal Pupils: equal, round, reactive to light Ears: normal, no lesions or deformities Nasal: mucosa pink, nonedematous, no septal deviation, turbinates normal Oral/Pharynx: tongue normal, posterior pharynx without erythema or exudate Neck: neck supple,  trachea midline, no masses Heart: regular rate and  rhythm, no murmur Extremities: normal extremities Neurological: grossly intact and non-focal Skin: bright red microdots on both lower legs from the ankles moving proximally to the knee, clustered in the lower ankle area and pretibial area of both legs;  MSE: oriented to time, place, and person Assessment Problems:   EXTERNAL HEMORRHOIDS (ICD-455.3) HYPERTROPHY PROSTATE W/UR OBST & OTH LUTS (ICD-600.01) CONSTIPATION, CHRONIC (ICD-564.09) ELEVATED BLOOD PRESSURE WITHOUT DIAGNOSIS OF HYPERTENSION (ICD-796.2) TRANSIENT ISCHEMIC ATTACK (ICD-435.9) COLONIC POLYPS, HX OF (ICD-V12.72) ELEVATED PROSTATE SPECIFIC ANTIGEN (ICD-790.93) SKIN CANCER, HX OF VERTEX OF SCALP (ICD-V10.83) HYPERLIPIDEMIA (ICD-272.4) DIVERTICULOSIS, COLON (ICD-562.10)  Assessed ELEVATED BLOOD PRESSURE WITHOUT  DIAGNOSIS OF HYPERTENSION as deteriorated - Loretta Doutt MD New Problems: SKIN RASH (ICD-782.1)   Patient Education: Patient and/or caregiver instructed in the following: rest. The risks, benefits and possible side effects were clearly explained and discussed with the patient.  The patient verbalized clear understanding.  The patient was given instructions to return if symptoms don't improve, worsen or new changes develop.  If it is not during clinic hours and the patient cannot get back to this clinic then the patient was told to seek medical care at an available urgent care or emergency department.  The patient verbalized understanding.   Demonstrates willingness to comply.  Plan Planning Comments:   Dermatology Referral If no improvement over the next 24 hours after hydrocortisone creme.     Follow Up: Follow up in 1-2 days if no improvement, Follow up on an as needed basis, Follow up with Primary Physician  The patient and/or caregiver has been counseled thoroughly with regard to medications prescribed including dosage, schedule, interactions, rationale for use, and possible side effects and they verbalize understanding.  Diagnoses and expected course of recovery discussed and will return if not improved as expected or if the condition worsens. Patient and/or caregiver verbalized understanding.   Patient Instructions: 1)  Please use the topical hydrocortisone 10 OTC creme to rash 2 or 3 times per day.   2)  Please call me tomorrow or Sunday if rash has not improved and I will get an appointment with dermatology set up for you.   3)  Return or go to the ER if no improvement or symptoms getting worse.   4)  Check your Blood Pressure regularly. If it is above: 140/90 you should make an appointment. 5)  I'm concerned about your elevated blood pressure and I want you to check it everyday for the next week and then please write down your numbers and take them to your primary care physician  next week.  I want you to see your PCP next week about your blood pressure.   6)  The patient was informed that there is no on-call provider or services available at this clinic during off-hours (when the clinic is closed).  If the patient developed a problem or concern that required immediate attention, the patient was advised to go the the nearest available urgent care or emergency department for medical care.  The patient verbalized understanding.

## 2010-07-23 NOTE — Letter (Signed)
Summary: [facility]   Imported By: Maryln Gottron 07/19/2010 08:27:00  _____________________________________________________________________  External Attachment:    Type:   Image     Comment:   External Document

## 2010-08-01 ENCOUNTER — Encounter: Payer: Self-pay | Admitting: Family Medicine

## 2010-08-01 DIAGNOSIS — L817 Pigmented purpuric dermatosis: Secondary | ICD-10-CM | POA: Insufficient documentation

## 2010-08-01 NOTE — Assessment & Plan Note (Signed)
Summary: F/U [facility] ON 07/21/10/[facility]   MEDICARE/[employer]   Vital Signs:  Patient profile:   77 year old male Height:      65.50 inches Weight:      159 pounds BMI:     26.15 Temp:     97.5 degrees F oral Pulse rate:   76 / minute Pulse rhythm:   regular BP sitting:   144 / 72  (left arm) Cuff size:   regular  Vitals Entered By: Delilah Shan CMA  Dull) (July 22, 2010 9:22 AM) CC: F/U [name] ER (notes attached)   History of Present Illness: ER follow up.  Er recs reviewed.    The rash wasn't getting better with topical cortisone.  It started about 1 week ago.  He went to [facility] and was dx'd with vasculitis.    No itching. He noted that walking with tight socks it will feel a little uncomfortable.  If laying down, the rash isn't as red.  It gets redder with a hot bath and with being upright.    No FCNAVD.  No CP, SOB.  No rash like this prev.  Prev seen by Dr. Margo Aye with derm and would like second opinion.  H/o melanoma.  No new meds, med changes.   Allergies: 1)  ! * Red Dye 2)  ! 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  Otherwise negative.    Physical Exam  General:  no apparent distress normocephalic atraumatic mucous membranes moist w/o  oral lesions neck supple regular rate and rhythm clear to auscultation bilaterally abdomen soft, not tender to palpation ext with trace edema and palpable purpura on bilateral lower extremity distal legs, but superior to ankles.  No ulceration or other secondary changes   Impression & Recommendations:  Problem # 1:  SKIN RASH (ICD-782.1) Likely vasculitis.  d/w patient.  He is well appearing and nontoxic.  Okay for outpatient follow up.  I would check labs below.  Prev with unremarkable CBC and INR/PTT at ER.  Unclear source.  I would like derm input.  If progressive symptoms, CP, short of breath, then to ER.  he agrees.  App help of derm.   His updated medication list for this problem includes:    Hydrocortisone 1 % Crea (Hydrocortisone) .Marland Kitchen... Apply to rash 2 to 3 times per day for rash;  Orders: T-Antinuclear Antib (ANA) [phone]) Dermatology Referral (Derma) Specimen Handling (09811) TLB-Sedimentation Rate (ESR) (85652-ESR) TLB-BMP (Basic Metabolic Panel-BMET) (80048-METABOL) TLB-Hepatic/Liver Function Pnl (80076-HEPATIC) TLB-TSH (Thyroid Stimulating Hormone) (84443-TSH)  Complete Medication List: 1)  Viagra 100 Mg Tabs (Sildenafil citrate) .Marland Kitchen.. 1 hour pior relations 2)  Hypotears 1-1 % Soln (Polyethyl glycol-polyvinyl alc) .... Use as needed as needed 3)  Multivitamins Tabs (Multiple vitamin) .Marland Kitchen.. 1 daily by  mouth 4)  Loratadine 10 Mg Tabs (Loratadine) .Marland Kitchen.. 1 daily by mouth as needed 5)  Nasalcrom 5.2 Mg/act Aers (Cromolyn sodium) .... Use as needed 6)  Bayer Aspirin Ec Low Dose 81 Mg Tbec (Aspirin) .Marland Kitchen.. 1 daily by mouth 7)  Pravachol 40 Mg Tabs (Pravastatin sodium) .... One tab by mouth bedtime 8)  Metamucil 1.7 Gm Wafr (Psyllium) .Marland Kitchen.. 1 daily by mouth 9)  Coq-10 100 Mg Caps (Coenzyme q10) .... Take one by mouth daily 10)  Vitamin C 500 Mg Tabs (Ascorbic acid) .... Take one by mouth daily 11)  Proctosol Hc 2.5 % Crea (Hydrocortisone) .... Aaa two times a day as needed 12)   Hydrocortisone 1 % Crea (Hydrocortisone) .... Apply to rash 2 to 3 times per day for rash; 13)  Aloe Vera Concentrate 25 Mg Caps (Aloe vera) .... Once daily 14)  Miralax Powd (Polyethylene glycol 3350) .... Once daily  Patient Instructions: 1)  See Shirlee Limerick about your referral before your leave today.  2)  You can get your results through our phone system.  Follow the instructions on the blue card. 3)  If you get short of breath, chest pain, or significant swelling in your legs, then go to the ER.  4)  Take care.    Orders Added: 1)  Est. Patient Level IV [16109] 2)  T-Antinuclear Antib (ANA) [[id number]] 3)  Dermatology Referral [Derma] 4)  Specimen Handling [99000] 5)  TLB-Sedimentation Rate (ESR) [85652-ESR] 6)  TLB-BMP (Basic Metabolic Panel-BMET) [80048-METABOL] 7)  TLB-Hepatic/Liver Function Pnl [80076-HEPATIC] 8)  TLB-TSH (Thyroid Stimulating Hormone) [91478-GNF]    Current Allergies (reviewed today): ! * RED DYE ! FLOMAX

## 2010-08-06 NOTE — Letter (Signed)
Summary: History Form  History Form   Imported By: Eugenio Hoes 07/31/2010 12:54:27  _____________________________________________________________________  External Attachment:    Type:   Image     Comment:   External Document

## 2010-08-08 ENCOUNTER — Other Ambulatory Visit: Payer: Self-pay | Admitting: Family Medicine

## 2010-09-17 NOTE — Assessment & Plan Note (Signed)
[facility]   NAME:  Jeffrey [name], Jeffrey [name] NO.:  [id number]   MEDICAL RECORD NO.:  [id number]           DATE OF BIRTH:   PHYSICIAN:  Theresia Majors. Tanda Rockers, M.D. VISIT DATE:  09/29/2006                                   OFFICE VISIT   SUBJECTIVE:  Jeffrey [name] returns for followup of a scalp ulceration.  He  was last seen 2 weeks ago.  In the interim, he has utilized local  antiseptic soap with a Tucks wipe.  He reports that there has been some  initial irritation from the Tucks, which he discontinued and he is now  utilizing just the local antiseptic soap.  He denies excessive drainage,  malodor, pain or fever.   OBJECTIVE:  The scalp wound shows significant contraction with  reepithealization advancing from the periphery.  The measurements  suggest significant area reduction from in excess of 50%.  There is a  well-adherent eschar with no drainage and no inflammatory changes.   ASSESSMENT:  Clinical improvement.   PLAN:  We will continue the patient on local hygiene, utilizing the  Tucks wipes as needed for comfort.  We will reevaluate him in 2 weeks.  If the rate of healing continues, this wound should be resolved.  I see  no need for a rebiopsy or further manipulations, as there has been  definite clinical improvement.      Harold A. Tanda Rockers, M.D.  Electronically Signed     HAN/MEDQ  D:  09/29/2006  T:  09/29/2006  Job:  [id number]

## 2010-09-17 NOTE — Assessment & Plan Note (Signed)
[facility]   NAME:  Jeffrey [name], Jeffrey [name] NO.:  [id number]   MEDICAL RECORD NO.:  [id number]      DATE OF BIRTH:  1929-04-24   PHYSICIAN:  Jake Shark A. Tanda [name], M.D. VISIT DATE:  09/15/2006                                   OFFICE VISIT   REASON FOR CONSULTATION:  Jeffrey [name] is a 77 year old man referred by  Dr. Francesca Oman, dermatologist, for evaluation of a non-healing scab  wound that has been present for over three years.   CLINICAL IMPRESSION:  Chronic non-healing wound most likely related to  repeated trauma secondary to compulsive hygiene.   RECOMMENDATIONS:  We have instructed the patient in the use of a  proprietary Tucks pad for hygiene avoiding all specialized shampoos or  mechanical abrasive techniques. The wound has been photographed and  measured.  We will re-evaluate the wound in two weeks.  If there is  continued evidence of non-healing more aggressive measures including re-  biopsy will be undertaken.   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  Over the last three years  there has been failure to completely heal.  He did not have adjuvant  chemotherapy nor did he have radiation therapy.  He has been treated  with topical agents including antibiotics as well as serial biopsies  which have all been negative for recurrence of neoplasia.  There has  been no fever.  There has been no malodorous drainage.  There has been  no pain.  The wound does have blood tinged friability.  He has  continuously worn a cap at all times.  He ha had no evidence of  satellite lesions on the trunk or arms.  He has been continuously  monitored by Dr. Margo Aye and most recently Dr. Elliot Gurney.   PAST MEDICAL HISTORY:  Remarkable for continuous care under the  direction of Dr. Laurita Quint.   CURRENT MEDICATIONS:  1.  Nasalcrom spray daily.  2. Loratadine 1 daily.  3. Hypo tears.  4. Multivitamins.  5. Vitamin C.  6. Calcium.  7. Vitamin E.  8. Garlique.   ALLERGIES:  Red dye.   PAST MEDICAL HISTORY:  He has no major medical problems aside from his  seasonal allergies.   PAST SURGICAL HISTORY:  He had his melanoma excised by Dr. Margo Aye in March  2005.  He has had tonsillectomy and neuroma excised from the anterior  right thigh.  He had a septoplasty.   FAMILY HISTORY:  Positive for colon cancer.  Negative heart attack,  stroke or diabetes.   SOCIAL HISTORY:  He is married.  He and his wife live in [place],  they have one son who lives in [place].   REVIEW OF SYSTEMS:  Remarkable for normal exercise tolerance.  There is  no dyspnea on exertion.  The patient specifically denies angina  pectoris.  His weight has been stable.  There are no bowel or bladder  dysfunctions.  There is no heat or cold intolerance.  His appetite is  good.  He denies food allergy.  The remainder of the review of systems  is negative.   PHYSICAL EXAMINATION:  GENERAL:  The patient is alert, oriented and in  no acute distress.  Responding appropriately to questioning.  VITAL SIGNS:  Blood pressure is 166/82, respirations 20, pulse rate is  74, temperature is 98.1.  HEENT:  Remarkable for a solitary ulcer on the left posterior parietal  occipital area.  The wound is full thickness and has a serous ballotable  friability.  There is no evidence of intense inflammation.  There is no  associated cellulitis.  There is a noticeable indentation of the  calvarium at this point.  The lesion itself is freely mobile and does  not appear attached to the underlying structures.  NECK:  Supple.  Trachea is midline.  Carotids are symmetrical with no bruits.  LUNGS:  Clear.  HEART:  Normal.  ABDOMEN:  Soft.  The pedal pulses are 3+ bilaterally.  NEUROLOGICALLY:  There are no pathologic reflexes.  Review of the  pathology reports  suggest that there has been no evidence of recurrence  of his melanoma.   DISCUSSION:  This 77 year old man describes a non-healing ulcer for over  three years after resection of a melanoma.  He has had continuous  followup with no evidence of recurrence.  He describes aggressive  hygiene.  The wound itself shows no satellite lesions, no deep  ulceration and appears to be restricted to the dermis.  Initially we  will protect the patient from any external noxious compounds including  soaps, technique in bathing, i.e. the abrasive nature of his bathing.  We have instructed him in a more gentle approach to hygiene utilizing  only a shower, mild soap and blotting the wound if necessary with an  over the counter Tucks wipe.  We will reevaluate the wound in two weeks,  re-measuring and photographing.  If there is absolutely no evidence of  healing at this point we will  make move to assess the calvarium with  either a CAT scan or plain skull films and we will ultimately require a  re-biopsy in multiple areas of the wound to determine the cellular  status of the healing process.  We have explained this approach to the  patient in terms that he seems to understand.  We have given him an  opportunity to ask questions.  He expresses gratitude for having been  seen in the clinic and indicates that he will be compliant.      Jeffrey [name], M.D.  Electronically Signed     HAN/MEDQ  D:  09/15/2006  T:  09/15/2006  Job:  [id number]   cc:   Mertha Finders., M.D.

## 2010-10-17 ENCOUNTER — Encounter: Payer: Self-pay | Admitting: Cardiovascular Disease

## 2010-11-05 ENCOUNTER — Other Ambulatory Visit: Payer: Self-pay | Admitting: Family Medicine

## 2010-11-05 NOTE — Telephone Encounter (Signed)
Received refill electronically from pharmacy. Is it okay to refill this?  Please confirm dispense amount and refills.

## 2010-11-07 ENCOUNTER — Encounter: Payer: Self-pay | Admitting: Family Medicine

## 2010-11-07 ENCOUNTER — Other Ambulatory Visit: Payer: Self-pay | Admitting: Family Medicine

## 2010-11-07 DIAGNOSIS — R972 Elevated prostate specific antigen [PSA]: Secondary | ICD-10-CM

## 2010-11-07 DIAGNOSIS — E7800 Pure hypercholesterolemia, unspecified: Secondary | ICD-10-CM

## 2010-11-11 ENCOUNTER — Other Ambulatory Visit (INDEPENDENT_AMBULATORY_CARE_PROVIDER_SITE_OTHER): Payer: Medicare Other | Admitting: Family Medicine

## 2010-11-11 DIAGNOSIS — E7800 Pure hypercholesterolemia, unspecified: Secondary | ICD-10-CM

## 2010-11-11 DIAGNOSIS — R972 Elevated prostate specific antigen [PSA]: Secondary | ICD-10-CM

## 2010-11-11 LAB — PSA, MEDICARE: PSA: 5.37 ng/mL — ABNORMAL HIGH (ref <=4.00)

## 2010-11-11 LAB — COMPREHENSIVE METABOLIC PANEL
ALT: 33 U/L (ref 0–53)
AST: 30 U/L (ref 0–37)
Albumin: 4.5 g/dL (ref 3.5–5.2)
Alkaline Phosphatase: 46 U/L (ref 39–117)
BUN: 12 mg/dL (ref 6–23)
CO2: 28 mEq/L (ref 19–32)
Calcium: 9.1 mg/dL (ref 8.4–10.5)
Chloride: 107 mEq/L (ref 96–112)
Creatinine, Ser: 1.1 mg/dL (ref 0.4–1.5)
GFR: 70.71 mL/min (ref >60.00)
Glucose, Bld: 98 mg/dL (ref 70–99)
Potassium: 4.1 mEq/L (ref 3.5–5.1)
Sodium: 141 mEq/L (ref 135–145)
Total Bilirubin: 0.7 mg/dL (ref 0.3–1.2)
Total Protein: 7 g/dL (ref 6.0–8.3)

## 2010-11-11 LAB — LIPID PANEL
Cholesterol: 133 mg/dL (ref 0–200)
HDL: 44.6 mg/dL (ref >39.00)
LDL Cholesterol: 61 mg/dL (ref 0–99)
Total CHOL/HDL Ratio: 3
Triglycerides: 139 mg/dL (ref 0.0–149.0)
VLDL: 27.8 mg/dL (ref 0.0–40.0)

## 2010-11-14 ENCOUNTER — Ambulatory Visit (INDEPENDENT_AMBULATORY_CARE_PROVIDER_SITE_OTHER): Payer: Medicare Other | Admitting: Family Medicine

## 2010-11-14 ENCOUNTER — Encounter: Payer: Self-pay | Admitting: Family Medicine

## 2010-11-14 VITALS — BP 124/70 | HR 72 | Temp 97.6°F | Ht 67.5 in | Wt 154.1 lb

## 2010-11-14 DIAGNOSIS — E785 Hyperlipidemia, unspecified: Secondary | ICD-10-CM

## 2010-11-14 DIAGNOSIS — R972 Elevated prostate specific antigen [PSA]: Secondary | ICD-10-CM

## 2010-11-14 DIAGNOSIS — N401 Enlarged prostate with lower urinary tract symptoms: Secondary | ICD-10-CM

## 2010-11-14 DIAGNOSIS — Z7189 Other specified counseling: Secondary | ICD-10-CM | POA: Insufficient documentation

## 2010-11-14 DIAGNOSIS — N138 Other obstructive and reflux uropathy: Secondary | ICD-10-CM

## 2010-11-14 DIAGNOSIS — N529 Male erectile dysfunction, unspecified: Secondary | ICD-10-CM

## 2010-11-14 DIAGNOSIS — Z23 Encounter for immunization: Secondary | ICD-10-CM

## 2010-11-14 DIAGNOSIS — Z8673 Personal history of transient ischemic attack (TIA), and cerebral infarction without residual deficits: Secondary | ICD-10-CM

## 2010-11-14 DIAGNOSIS — G459 Transient cerebral ischemic attack, unspecified: Secondary | ICD-10-CM

## 2010-11-14 HISTORY — DX: Male erectile dysfunction, unspecified: N52.9

## 2010-11-14 MED ORDER — FLUTICASONE PROPIONATE 50 MCG/ACT NA SUSP: 2.0000 | Freq: Every day | NASAL | Status: DC

## 2010-11-14 NOTE — Assessment & Plan Note (Addendum)
Pt uninterested in further testing.  PSA not sig elevated above baseline. Should PSA be greatly elevated, he wouldn't want w/u or eval, even in the event of slow/fast evolving CA.

## 2010-11-14 NOTE — Assessment & Plan Note (Addendum)
On ASA, statin, BP controlled. No focal sx now.

## 2010-11-14 NOTE — Progress Notes (Signed)
Prev with TD/PNA vaccine and colonoscopy done.    H/o TIA.  On statin and ASA.  BP controlled.  Walking several times a week and some light weight work otherwise.    Seasonal allergies.  On clartin and nasalcrom.  We talked about nasal saline.    Elevated PSA, longstanding.  Intolerant of flomax.  No meds currently.  Urination is "pretty good", some nocturia (x1), nonbothersome.  No dysuria (except after drinking caffeine/soda) other than incomplete voiding.  We talked about screening.    ED on viagra, with some relief but not as much as prev.  Asking about vacuum pump. We discussed today.    Elevated Cholesterol: Using medications without problems:yes Muscle aches: no Other complaints:no  PMH and SH reviewed  Meds, vitals, and allergies reviewed.   ROS: See HPI.  Otherwise negative.    GEN: nad, alert and oriented HEENT: mucous membranes moist NECK: supple w/o LA CV: rrr. PULM: ctab, no inc wob ABD: soft, +bs EXT: no edema SKIN: no acute rash Prostate gland firm and smooth, B enlargement noted, but no nodularity, tenderness, mass, asymmetry or induration.

## 2010-11-14 NOTE — Assessment & Plan Note (Signed)
At goal, labs d/w pt.

## 2010-11-14 NOTE — Patient Instructions (Addendum)
Check with your insurance to see if they will cover the shingles shot. I would get a flu shot each fall. Talk with your wife about setting up a health care power of attorney/living will.   Call me if you have questions or if I can help. Use the flonase for your allergies- 2 sprays in each nostril.  Let me know if that doesn't help or if you have other concerns.   I would get another physical in a year.  Take care.  Glad to see you.

## 2010-11-14 NOTE — Assessment & Plan Note (Signed)
Less effect with viagra, rx written for pump.

## 2011-01-04 ENCOUNTER — Emergency Department (HOSPITAL_COMMUNITY)
Admission: EM | Admit: 2011-01-04 | Discharge: 2011-01-04 | Disposition: A | Discharge status: Home / Self Care | Payer: Medicare Other | Attending: Emergency Medicine | Admitting: Emergency Medicine

## 2011-01-04 DIAGNOSIS — R42 Dizziness and giddiness: Secondary | ICD-10-CM | POA: Insufficient documentation

## 2011-01-04 DIAGNOSIS — Z8673 Personal history of transient ischemic attack (TIA), and cerebral infarction without residual deficits: Secondary | ICD-10-CM | POA: Insufficient documentation

## 2011-01-04 DIAGNOSIS — H538 Other visual disturbances: Secondary | ICD-10-CM | POA: Insufficient documentation

## 2011-01-04 DIAGNOSIS — E7800 Pure hypercholesterolemia, unspecified: Secondary | ICD-10-CM | POA: Insufficient documentation

## 2011-01-04 DIAGNOSIS — R110 Nausea: Secondary | ICD-10-CM | POA: Insufficient documentation

## 2011-01-04 LAB — CBC
HCT: 41.8 % (ref 39.0–52.0)
Hemoglobin: 15.4 g/dL (ref 13.0–17.0)
MCH: 31.9 pg (ref 26.0–34.0)
MCHC: 36.8 g/dL — ABNORMAL HIGH (ref 30.0–36.0)
MCV: 86.5 fL (ref 78.0–100.0)
Platelets: 150 K/uL (ref 150–400)
RBC: 4.83 MIL/uL (ref 4.22–5.81)
RDW: 12.2 % (ref 11.5–15.5)
WBC: 6.8 K/uL (ref 4.0–10.5)

## 2011-01-04 LAB — DIFFERENTIAL
Basophils Absolute: 0 10*3/uL (ref 0.0–0.1)
Basophils Relative: 0 % (ref 0–1)
Eosinophils Absolute: 0.1 10*3/uL (ref 0.0–0.7)
Eosinophils Relative: 1 % (ref 0–5)
Lymphocytes Relative: 19 % (ref 12–46)
Lymphs Abs: 1.3 10*3/uL (ref 0.7–4.0)
Monocytes Absolute: 0.6 10*3/uL (ref 0.1–1.0)
Monocytes Relative: 9 % (ref 3–12)
Neutro Abs: 4.8 10*3/uL (ref 1.7–7.7)
Neutrophils Relative %: 71 % (ref 43–77)

## 2011-01-04 LAB — BASIC METABOLIC PANEL
BUN: 15 mg/dL (ref 6–23)
CO2: 24 mEq/L (ref 19–32)
Calcium: 9.5 mg/dL (ref 8.4–10.5)
Chloride: 103 mEq/L (ref 96–112)
Creatinine, Ser: 0.91 mg/dL (ref 0.50–1.35)
GFR calc Af Amer: >60 mL/min (ref >60)
GFR calc non Af Amer: >60 mL/min (ref >60)
Glucose, Bld: 98 mg/dL (ref 70–99)
Potassium: 4.4 mEq/L (ref 3.5–5.1)
Sodium: 138 mEq/L (ref 135–145)

## 2011-05-01 ENCOUNTER — Ambulatory Visit (INDEPENDENT_AMBULATORY_CARE_PROVIDER_SITE_OTHER): Payer: Medicare Other | Admitting: Family Medicine

## 2011-05-01 ENCOUNTER — Ambulatory Visit: Payer: Medicare Other | Admitting: Family Medicine

## 2011-05-01 ENCOUNTER — Encounter: Payer: Self-pay | Admitting: Family Medicine

## 2011-05-01 DIAGNOSIS — H6990 Unspecified Eustachian tube disorder, unspecified ear: Secondary | ICD-10-CM

## 2011-05-01 DIAGNOSIS — H6980 Other specified disorders of Eustachian tube, unspecified ear: Secondary | ICD-10-CM | POA: Insufficient documentation

## 2011-05-01 HISTORY — DX: Unspecified eustachian tube disorder, unspecified ear: H69.90

## 2011-05-01 NOTE — Assessment & Plan Note (Signed)
See instructions. No focal infection seen.

## 2011-05-01 NOTE — Patient Instructions (Signed)
Take Guaifenesin (400mg ), take 11/2 tabs by mouth AM and NOON. Get GUAIFENESIN by  going to [facility] or [facility] and getting MUCOUS RELIEF EXPECTORANT/CONGESTION. DO NOT GET MUCINEX (Timed Release Guaifenesin)  Drink lots of fluids. Take IBP 200mg  x 3 after each meal for a few days only. May still take Tyl 500mg  2 three times a day as needed. Gargle with 30ccs of warm salt water every half hour for 2 days as able.

## 2011-05-01 NOTE — Progress Notes (Signed)
  Subjective:    Patient ID: Jeffrey [name], male    DOB: 09-28-1928, 77 y.o.   MRN: [id number]  HPI Pt of Dr Lianne Bushy, former pt of mine, here as acute appt for ear problems. His ears hurt on both sides with some dizziness and weakness in the legs. He actually fell last night when getting up. He has used peroxide on his ears but they have gotten worse. He had temp 101.7 this AM, given Tyl at 810. Temp here 98.8. He has had a light brfst this AM, feeling better. He had little to eat yeasterday.  He denies headache but ear pain and dizziness. He has some nasal inflammation, using Nasalcrom regularly. Also thinks he had some nasal allergies. No ST, No cough. No N/V, No diarrhea. He has taken Tyl and had one IBP earlier.      Review of Systems  Constitutional: Negative for fever, chills, diaphoresis, activity change, appetite change and fatigue.  HENT: Negative for hearing loss, ear pain, congestion, sore throat, rhinorrhea, neck pain, neck stiffness, postnasal drip, sinus pressure, tinnitus and ear discharge.        Mild dry wax in canals bilat, TMs nml behind.  Eyes: Negative for pain, discharge and visual disturbance.  Respiratory: Negative for cough, shortness of breath and wheezing.   Cardiovascular: Negative for chest pain and palpitations.       No SOB w/ exertion  Gastrointestinal:       No heartburn or swallowing problems.  Genitourinary:       No nocturia  Skin:       No itching or dryness.  Neurological:       No tingling or balance problems.  All other systems reviewed and are negative.       Objective:   Physical Exam  Constitutional: He appears well-developed and well-nourished. No distress.  HENT:  Head: Normocephalic and atraumatic.  Right Ear: External ear normal.  Left Ear: External ear normal.  Nose: Nose normal.  Mouth/Throat: Oropharynx is clear and moist.       Mild dry wax in canals bilat, TMs nml behind.  Eyes: Conjunctivae and EOM are normal. Pupils  are equal, round, and reactive to light. Right eye exhibits no discharge. Left eye exhibits no discharge.  Neck: Normal range of motion. Neck supple.  Cardiovascular: Normal rate and regular rhythm.   Pulmonary/Chest: Effort normal and breath sounds normal. He has no wheezes.  Lymphadenopathy:    He has no cervical adenopathy.  Skin: He is not diaphoretic.          Assessment & Plan:

## 2011-05-08 ENCOUNTER — Encounter: Payer: Self-pay | Admitting: Family Medicine

## 2011-05-08 ENCOUNTER — Ambulatory Visit (INDEPENDENT_AMBULATORY_CARE_PROVIDER_SITE_OTHER): Payer: Medicare Other | Admitting: Family Medicine

## 2011-05-08 DIAGNOSIS — H6980 Other specified disorders of Eustachian tube, unspecified ear: Secondary | ICD-10-CM | POA: Diagnosis not present

## 2011-05-08 NOTE — Patient Instructions (Signed)
Drink plenty of fluids, use the nasalcrom and nasal saline and you can take the mucinex if you think that helps.  If not, then stop the mucinex.  This should gradually improve.  Take care.

## 2011-05-08 NOTE — Progress Notes (Signed)
Likely prev ETD.  Started 04/30/11, worse in the following days.  He had dizzy sensation and fever.  Was seen 05/01/11 in clinic.    Still with some lightheaded feeling and still feels weak.  He feels some ear pressure and nasal pressure.  Intolerant of advil with GI upset.  No focal neuro changes o/w.  He isn't dizzy now but he'll occ get a little lightheaded, but not on standing.    Meds, vitals, and allergies reviewed.   ROS: See HPI.  Otherwise, noncontributory.   nad ncat Tm w/o erythema, poor movement on valsalva Nasal congestion noted Mild cobblestoning Neck supple rrr ctab Gross motor wnl x4 Speech wnl No orthostatic sx on standing

## 2011-05-09 NOTE — Assessment & Plan Note (Signed)
Still with likely ETD, he is some improved.  He may have some lingering diffuse weakness (nonfocal weakness) from the fever and antecedent illness.  Nontoxic.  He doesn't tolerate nasal steroids and I wouldn't do anything else.  I think this will gradually resolved.  He agrees.  F/u prn.

## 2011-08-08 ENCOUNTER — Telehealth: Payer: Self-pay | Admitting: Family Medicine

## 2011-08-08 NOTE — Telephone Encounter (Signed)
Have patient stop pravastatin for now.  If pain is totally resolved, he can cancel the appointment- but let us know so we can update his allergy/intolerance list.  If not improving with stopping the pravastatin, then keep the OV next week.  Thanks.

## 2011-08-08 NOTE — Telephone Encounter (Signed)
Triage Record Num: [id number] Operator: Lyn Hollingshead Patient Name: Jeffrey [name] Call Date & Time: 08/08/2011 12:49:12PM Patient Phone: [phone] PCP: Crawford Givens Patient Gender: Male PCP Fax : Patient DOB: [birth year] Practice Name: [facility] Reason for Call: Caller: Joan/Spouse is calling with a question about Pravastatin.The medication was written by Crawford Givens Clelia Croft). Onset- 01/2011 Afebrile. Pt is having pain in his back, legs, knees and stiffness. They think it is from Pravastatin. Emergent s/s of Back Symptoms r/o. Pt to see provider within 72hrs. Caller transferred to office to schedule. Protocol(s) Used: Back Symptoms Recommended Outcome per Protocol: See Provider within 72 Hours Reason for Outcome: Mild to moderate pain in back with normal activity or rest AND not responding to 72 hours of home care Care Advice: ~ Avoid heavy lifting, bending and twisting of the back, and prolonged sitting until evaluated by provider. Apply a cloth-covered cold or ice pack to the area for 20 minutes 4 to 8 times a day for relief of pain for the first 24-48 hours. After 24 to 48 hours of cold application, use a cloth-covered heat pack to the area for 20 minutes 3 to 4 times a day. ~ Go to the ED if you have worsening pain, numbness or weakness of arms or legs, cannot walk, or have new unexplained changes in bladder or bowel function. Another adult should drive. ~ ~ Avoid activity that causes or worsens symptoms. 08/08/2011 1:18:46PM Page 1 of 1 [id number]

## 2011-08-08 NOTE — Telephone Encounter (Signed)
Left detailed message on VM.

## 2011-08-11 ENCOUNTER — Encounter: Payer: Self-pay | Admitting: Family Medicine

## 2011-08-11 ENCOUNTER — Ambulatory Visit (INDEPENDENT_AMBULATORY_CARE_PROVIDER_SITE_OTHER): Payer: Medicare Other | Admitting: Family Medicine

## 2011-08-11 ENCOUNTER — Telehealth: Payer: Self-pay

## 2011-08-11 VITALS — BP 136/62 | HR 78 | Temp 97.5°F | Wt 153.0 lb

## 2011-08-11 DIAGNOSIS — R209 Unspecified disturbances of skin sensation: Secondary | ICD-10-CM | POA: Diagnosis not present

## 2011-08-11 DIAGNOSIS — M25569 Pain in unspecified knee: Secondary | ICD-10-CM | POA: Diagnosis not present

## 2011-08-11 DIAGNOSIS — R202 Paresthesia of skin: Secondary | ICD-10-CM

## 2011-08-11 MED ORDER — PRAVASTATIN SODIUM 40 MG PO TABS: ORAL_TABLET | ORAL | Status: DC

## 2011-08-11 NOTE — Patient Instructions (Signed)
Stop the pravastatin for 2 weeks.  If not better, then start back on it.  I would sleep with a pillow between your legs and get a soft small arch support for your foot.  Take care.

## 2011-08-11 NOTE — Progress Notes (Signed)
Positional lower back, knee and R hip pain at night.  Worse with weather changes.   He has some AM stiffness in joints that gets better with movement.  Asking about options.  On statin.  Also the instep of L foot occ tingles during the day, rarely at night.  This appears to be position, ie in his shoes.  No trauma.   Meds, vitals, and allergies reviewed.   ROS: See HPI.  Otherwise, noncontributory.  nad ncat Mmm rrr ctab abd soft, normal BS Ext well perfused and w/o edema.  Normal inspection and palpation of L foot, intact arch and normal sensation Crepitus of knees B with ROM but ligaments intact on testing B R hip without pain on rom.   No midline back pain

## 2011-08-11 NOTE — Telephone Encounter (Signed)
Pt called to let Lugene know she got the message and pt is somewhat better but pt will keep appt scheduled this afternoon to see Dr Para March.

## 2011-08-13 DIAGNOSIS — R202 Paresthesia of skin: Secondary | ICD-10-CM | POA: Insufficient documentation

## 2011-08-13 DIAGNOSIS — M25569 Pain in unspecified knee: Secondary | ICD-10-CM

## 2011-08-13 HISTORY — DX: Paresthesia of skin: R20.2

## 2011-08-13 HISTORY — DX: Pain in unspecified knee: M25.569

## 2011-08-13 NOTE — Assessment & Plan Note (Signed)
Only during the day, this sounds positional with benign exam today.  Use arch supports and f/u prn.

## 2011-08-13 NOTE — Assessment & Plan Note (Signed)
Likely OA, treat with tylenol and hold statin for now.  If not better with statin held, then restart.  I think this is only OA, likely with intermittent sx in back and hips also.

## 2011-09-09 DIAGNOSIS — Z85820 Personal history of malignant melanoma of skin: Secondary | ICD-10-CM | POA: Diagnosis not present

## 2011-09-22 ENCOUNTER — Encounter: Payer: Self-pay | Admitting: Family Medicine

## 2011-09-22 DIAGNOSIS — Z85820 Personal history of malignant melanoma of skin: Secondary | ICD-10-CM | POA: Insufficient documentation

## 2011-10-14 DIAGNOSIS — L219 Seborrheic dermatitis, unspecified: Secondary | ICD-10-CM | POA: Diagnosis not present

## 2011-12-05 ENCOUNTER — Other Ambulatory Visit: Payer: Self-pay | Admitting: *Deleted

## 2011-12-05 NOTE — Telephone Encounter (Signed)
Faxed refill request   

## 2011-12-07 MED ORDER — SILDENAFIL CITRATE 100 MG PO TABS: ORAL_TABLET | ORAL | Status: DC

## 2011-12-07 NOTE — Telephone Encounter (Signed)
Sent!

## 2011-12-11 ENCOUNTER — Other Ambulatory Visit: Payer: Self-pay | Admitting: Family Medicine

## 2011-12-11 DIAGNOSIS — E7800 Pure hypercholesterolemia, unspecified: Secondary | ICD-10-CM

## 2011-12-16 ENCOUNTER — Other Ambulatory Visit (INDEPENDENT_AMBULATORY_CARE_PROVIDER_SITE_OTHER): Payer: Medicare Other

## 2011-12-16 DIAGNOSIS — E7800 Pure hypercholesterolemia, unspecified: Secondary | ICD-10-CM

## 2011-12-16 LAB — COMPREHENSIVE METABOLIC PANEL
ALT: 35 U/L (ref 0–53)
AST: 34 U/L (ref 0–37)
Albumin: 4.4 g/dL (ref 3.5–5.2)
Alkaline Phosphatase: 47 U/L (ref 39–117)
BUN: 13 mg/dL (ref 6–23)
CO2: 25 mEq/L (ref 19–32)
Calcium: 9 mg/dL (ref 8.4–10.5)
Chloride: 103 mEq/L (ref 96–112)
Creatinine, Ser: 0.9 mg/dL (ref 0.4–1.5)
GFR: 83.95 mL/min (ref >60.00)
Glucose, Bld: 97 mg/dL (ref 70–99)
Potassium: 4.6 mEq/L (ref 3.5–5.1)
Sodium: 135 mEq/L (ref 135–145)
Total Bilirubin: 0.8 mg/dL (ref 0.3–1.2)
Total Protein: 7.1 g/dL (ref 6.0–8.3)

## 2011-12-16 LAB — LIPID PANEL
Cholesterol: 130 mg/dL (ref 0–200)
HDL: 45.8 mg/dL (ref >39.00)
LDL Cholesterol: 55 mg/dL (ref 0–99)
Total CHOL/HDL Ratio: 3
Triglycerides: 146 mg/dL (ref 0.0–149.0)
VLDL: 29.2 mg/dL (ref 0.0–40.0)

## 2011-12-22 ENCOUNTER — Encounter: Payer: Self-pay | Admitting: Family Medicine

## 2011-12-22 ENCOUNTER — Ambulatory Visit (INDEPENDENT_AMBULATORY_CARE_PROVIDER_SITE_OTHER): Payer: Medicare Other | Admitting: Family Medicine

## 2011-12-22 VITALS — BP 142/60 | HR 73 | Temp 97.4°F | Ht 67.0 in | Wt 153.0 lb

## 2011-12-22 DIAGNOSIS — N529 Male erectile dysfunction, unspecified: Secondary | ICD-10-CM

## 2011-12-22 DIAGNOSIS — M25569 Pain in unspecified knee: Secondary | ICD-10-CM

## 2011-12-22 DIAGNOSIS — E785 Hyperlipidemia, unspecified: Secondary | ICD-10-CM | POA: Diagnosis not present

## 2011-12-22 DIAGNOSIS — Z0000 Encounter for general adult medical examination without abnormal findings: Secondary | ICD-10-CM | POA: Insufficient documentation

## 2011-12-22 DIAGNOSIS — H6980 Other specified disorders of Eustachian tube, unspecified ear: Secondary | ICD-10-CM

## 2011-12-22 DIAGNOSIS — Z7189 Other specified counseling: Secondary | ICD-10-CM

## 2011-12-22 DIAGNOSIS — G459 Transient cerebral ischemic attack, unspecified: Secondary | ICD-10-CM | POA: Diagnosis not present

## 2011-12-22 DIAGNOSIS — R972 Elevated prostate specific antigen [PSA]: Secondary | ICD-10-CM

## 2011-12-22 HISTORY — DX: Encounter for general adult medical examination without abnormal findings: Z00.00

## 2011-12-22 MED ORDER — SILDENAFIL CITRATE 100 MG PO TABS: ORAL_TABLET | ORAL | Status: DC

## 2011-12-22 NOTE — Patient Instructions (Addendum)
I would get a flu shot each fall.   If you want [place] to scan in your living will, we'll be glad to do so.   I would consider switching the loratadine to 5mg  of zyrtec.   Take care. Glad to see you.   Recheck labs in 1 year.

## 2011-12-22 NOTE — Assessment & Plan Note (Signed)
He again declined further w/u

## 2011-12-22 NOTE — Assessment & Plan Note (Signed)
Varies with weather changes, likely OA.

## 2011-12-22 NOTE — Assessment & Plan Note (Signed)
Controlled, labs d/w pt.  Continue current meds.  

## 2011-12-22 NOTE — Assessment & Plan Note (Addendum)
See scanned forms. Routine anticipatory guidance given to patient. See health maintenance.  Tetanus 2006  Flu shot encouraged  Shingles 2012  PNA 2008  Colonoscopy 2011  Prostate cancer screening declined, prev discussed.  Advance directive d/w pt. He has a living will and we can scan it in the future.  He doesn't have it with him today.

## 2011-12-22 NOTE — Assessment & Plan Note (Signed)
No new sx.  Continue current statin, ASA.

## 2011-12-22 NOTE — Progress Notes (Signed)
I have personally reviewed the Medicare Annual Wellness questionnaire and have noted 1. The patient's medical and social history 2. Their use of alcohol, tobacco or illicit drugs 3. Their current medications and supplements 4. The patient's functional ability including ADL's, fall risks, home safety risks and hearing or visual             impairment. 5. Diet and physical activities 6. Evidence for depression or mood disorders  The patients weight, height, BMI have been recorded in the chart and visual acuity is per eye clinic.  I have made referrals, counseling and provided education to the patient based review of the above and I have provided the pt with a written personalized care plan for preventive services.  See scanned forms.  Routine anticipatory guidance given to patient.  See health maintenance. Tetanus 2006 Flu shot encouraged Shingles 2012 PNA 2008 Colonoscopy 2011 Prostate cancer screening declined, prev discussed.  Advance directive d/w pt. He has a living will and we can scan it in the future.  He doesn't have it with him today.   Elevated Cholesterol: Using medications without problems: yes Muscle aches: no Diet compliance: yes Exercise:yes  ED controlled with viagra and no ADE.  No CP, SOB, BLE edema.    H/o TIA with altered speech.  No new focal sx.  On statin, ASA.  BP meds hadn't been started to avoid hypotension.  Labs d/w pt.   Allergy sx, rhinorrhea, stuffy. Some better with current meds.  Intolerant of flonase.  S/p allergy shots.  He has environmental triggers, ie dusty and not well ventilated room.  PMH and SH reviewed  Meds, vitals, and allergies reviewed.   ROS: See HPI.  Otherwise negative.    GEN: nad, alert and oriented HEENT: mucous membranes moist, TM wnl, mild nasal irritation and mild cobblestoning noted in OP NECK: supple w/o LA CV: rrr. PULM: ctab, no inc wob ABD: soft, +bs EXT: no edema SKIN: no acute rash, benign appearing SKs  noted DRE deferred

## 2011-12-22 NOTE — Assessment & Plan Note (Signed)
Controlled with viagra

## 2011-12-22 NOTE — Assessment & Plan Note (Signed)
Likely allergic/irritant related, consider trial of zyrtec instead of claritin

## 2012-02-05 ENCOUNTER — Ambulatory Visit (INDEPENDENT_AMBULATORY_CARE_PROVIDER_SITE_OTHER): Payer: Medicare Other

## 2012-02-05 DIAGNOSIS — Z23 Encounter for immunization: Secondary | ICD-10-CM

## 2012-03-09 DIAGNOSIS — Z85820 Personal history of malignant melanoma of skin: Secondary | ICD-10-CM | POA: Diagnosis not present

## 2012-03-09 DIAGNOSIS — D235 Other benign neoplasm of skin of trunk: Secondary | ICD-10-CM | POA: Diagnosis not present

## 2012-03-09 DIAGNOSIS — D1801 Hemangioma of skin and subcutaneous tissue: Secondary | ICD-10-CM | POA: Diagnosis not present

## 2012-06-08 DIAGNOSIS — L259 Unspecified contact dermatitis, unspecified cause: Secondary | ICD-10-CM | POA: Diagnosis not present

## 2012-06-21 DIAGNOSIS — H02059 Trichiasis without entropian unspecified eye, unspecified eyelid: Secondary | ICD-10-CM | POA: Diagnosis not present

## 2012-06-21 DIAGNOSIS — H2510 Age-related nuclear cataract, unspecified eye: Secondary | ICD-10-CM | POA: Diagnosis not present

## 2012-06-21 DIAGNOSIS — H01009 Unspecified blepharitis unspecified eye, unspecified eyelid: Secondary | ICD-10-CM | POA: Diagnosis not present

## 2012-06-25 DIAGNOSIS — H1030 Unspecified acute conjunctivitis, unspecified eye: Secondary | ICD-10-CM | POA: Diagnosis not present

## 2012-09-07 DIAGNOSIS — Z85820 Personal history of malignant melanoma of skin: Secondary | ICD-10-CM | POA: Diagnosis not present

## 2012-09-07 DIAGNOSIS — D235 Other benign neoplasm of skin of trunk: Secondary | ICD-10-CM | POA: Diagnosis not present

## 2012-09-09 DIAGNOSIS — H02059 Trichiasis without entropian unspecified eye, unspecified eyelid: Secondary | ICD-10-CM | POA: Diagnosis not present

## 2012-09-21 ENCOUNTER — Encounter: Payer: Self-pay | Admitting: Gastroenterology

## 2012-10-01 DIAGNOSIS — H10509 Unspecified blepharoconjunctivitis, unspecified eye: Secondary | ICD-10-CM | POA: Diagnosis not present

## 2012-10-12 ENCOUNTER — Encounter: Payer: Self-pay | Admitting: Gastroenterology

## 2012-10-27 ENCOUNTER — Other Ambulatory Visit: Payer: Self-pay | Admitting: *Deleted

## 2012-10-27 MED ORDER — PRAVASTATIN SODIUM 40 MG PO TABS: ORAL_TABLET | ORAL | Status: DC

## 2012-11-08 ENCOUNTER — Encounter: Payer: Self-pay | Admitting: Gastroenterology

## 2012-11-08 ENCOUNTER — Ambulatory Visit (INDEPENDENT_AMBULATORY_CARE_PROVIDER_SITE_OTHER): Payer: Medicare Other | Admitting: Gastroenterology

## 2012-11-08 VITALS — BP 134/70 | HR 76 | Ht 66.0 in | Wt 153.0 lb

## 2012-11-08 DIAGNOSIS — Z86010 Personal history of colonic polyps: Secondary | ICD-10-CM

## 2012-11-08 DIAGNOSIS — K5900 Constipation, unspecified: Secondary | ICD-10-CM | POA: Diagnosis not present

## 2012-11-08 NOTE — Patient Instructions (Addendum)
We have cancelled your Recall Colonoscopy.   Please follow up as needed.   Thank you for choosing me and [facility].  Venita Lick. Pleas Koch., MD., Clementeen Graham

## 2012-11-08 NOTE — Progress Notes (Signed)
History of Present Illness: This is an 79 year old male accompanied by his wife. He has a history of adenomatous colon polyps with high-grade dysplasia in 1995. His most recent colonoscopy in 2011 showed one very small polyp that was not retrieved and his colonoscopy prior to that in 2008 showed a small tubular adenoma. He has no ongoing gastrointestinal complaints. Chronic constipation is well-controlled daily MiraLax. Denies weight loss, abdominal pain, constipation, diarrhea, change in stool caliber, melena, hematochezia, nausea, vomiting, dysphagia, reflux symptoms, chest pain.   Current Medications, Allergies, Past Medical History, Past Surgical History, Family History and Social History were reviewed in [employer] record.  Physical Exam: General: Well developed , well nourished, no acute distress Head: Normocephalic and atraumatic Eyes:  sclerae anicteric, EOMI Ears: Normal auditory acuity Mouth: No deformity or lesions Lungs: Clear throughout to auscultation Heart: Regular rate and rhythm; no murmurs, rubs or bruits Abdomen: Soft, non tender and non distended. No masses, hepatosplenomegaly or hernias noted. Normal Bowel sounds Rectal: Deferred Musculoskeletal: Symmetrical with no gross deformities  Pulses:  Normal pulses noted Extremities: No clubbing, cyanosis, edema or deformities noted Neurological: Alert oriented x 4, grossly nonfocal Psychological:  Alert and cooperative. Normal mood and affect  Assessment and Recommendations:  1. Personal history of adenomatous colon polyps. One polyp with high-grade dysplasia in 1995. Offered the option of proceeding with surveillance colonoscopy versus discontinuing surveillance. It is reasonable to discontinue surveillance colonoscopies given that his last 2 colonoscopies had minimal findings and he agrees with this recommendation. GI followup when necessary.  2. Mild chronic constipation. Continue daily MiraLax.

## 2012-11-09 ENCOUNTER — Encounter: Payer: Self-pay | Admitting: Gastroenterology

## 2012-12-15 ENCOUNTER — Other Ambulatory Visit: Payer: Self-pay | Admitting: Family Medicine

## 2012-12-15 DIAGNOSIS — E7800 Pure hypercholesterolemia, unspecified: Secondary | ICD-10-CM

## 2012-12-20 ENCOUNTER — Other Ambulatory Visit (INDEPENDENT_AMBULATORY_CARE_PROVIDER_SITE_OTHER): Payer: Medicare Other

## 2012-12-20 DIAGNOSIS — E7800 Pure hypercholesterolemia, unspecified: Secondary | ICD-10-CM | POA: Diagnosis not present

## 2012-12-20 DIAGNOSIS — R030 Elevated blood-pressure reading, without diagnosis of hypertension: Secondary | ICD-10-CM | POA: Diagnosis not present

## 2012-12-20 LAB — COMPREHENSIVE METABOLIC PANEL
ALT: 33 U/L (ref 0–53)
AST: 30 U/L (ref 0–37)
Albumin: 4.2 g/dL (ref 3.5–5.2)
Alkaline Phosphatase: 42 U/L (ref 39–117)
BUN: 13 mg/dL (ref 6–23)
CO2: 28 mEq/L (ref 19–32)
Calcium: 9.2 mg/dL (ref 8.4–10.5)
Chloride: 104 mEq/L (ref 96–112)
Creatinine, Ser: 1 mg/dL (ref 0.4–1.5)
GFR: 73.5 mL/min (ref >60.00)
Glucose, Bld: 92 mg/dL (ref 70–99)
Potassium: 4.6 mEq/L (ref 3.5–5.1)
Sodium: 138 mEq/L (ref 135–145)
Total Bilirubin: 0.9 mg/dL (ref 0.3–1.2)
Total Protein: 6.9 g/dL (ref 6.0–8.3)

## 2012-12-20 LAB — LIPID PANEL
Cholesterol: 128 mg/dL (ref 0–200)
HDL: 38.7 mg/dL — ABNORMAL LOW (ref >39.00)
LDL Cholesterol: 57 mg/dL (ref 0–99)
Total CHOL/HDL Ratio: 3
Triglycerides: 163 mg/dL — ABNORMAL HIGH (ref 0.0–149.0)
VLDL: 32.6 mg/dL (ref 0.0–40.0)

## 2012-12-22 ENCOUNTER — Other Ambulatory Visit: Payer: Self-pay | Admitting: Family Medicine

## 2012-12-23 ENCOUNTER — Encounter: Payer: Self-pay | Admitting: Family Medicine

## 2012-12-23 ENCOUNTER — Ambulatory Visit (INDEPENDENT_AMBULATORY_CARE_PROVIDER_SITE_OTHER): Payer: Medicare Other | Admitting: Family Medicine

## 2012-12-23 VITALS — BP 118/70 | HR 75 | Temp 97.3°F | Ht 66.0 in | Wt 152.2 lb

## 2012-12-23 DIAGNOSIS — E785 Hyperlipidemia, unspecified: Secondary | ICD-10-CM

## 2012-12-23 DIAGNOSIS — Z0000 Encounter for general adult medical examination without abnormal findings: Secondary | ICD-10-CM | POA: Diagnosis not present

## 2012-12-23 DIAGNOSIS — J309 Allergic rhinitis, unspecified: Secondary | ICD-10-CM | POA: Diagnosis not present

## 2012-12-23 MED ORDER — PRAVASTATIN SODIUM 40 MG PO TABS: 40.0000 mg | ORAL_TABLET | Freq: Every day | ORAL | Status: DC

## 2012-12-23 MED ORDER — MONTELUKAST SODIUM 10 MG PO TABS: 10.0000 mg | ORAL_TABLET | Freq: Every day | ORAL | Status: DC

## 2012-12-23 NOTE — Assessment & Plan Note (Signed)
Prev failed allergy shots.  Continue current meds.  Would add on singulair.

## 2012-12-23 NOTE — Assessment & Plan Note (Signed)
See scanned forms.  Routine anticipatory guidance given to patient.  See health maintenance. Flu yearly Shingles 2012 PNA 2008 Tetanus 2006 Colonoscopy 2011, pt declined f/u.  This is reasonable.  Prostate cancer screening not indicated.  Advance directive encouraged, wife designated.  Cognitive function addressed- see scanned forms- and if abnormal then additional documentation follows.

## 2012-12-23 NOTE — Progress Notes (Signed)
I have personally reviewed the Medicare Annual Wellness questionnaire and have noted 1. The patient's medical and social history 2. Their use of alcohol, tobacco or illicit drugs 3. Their current medications and supplements 4. The patient's functional ability including ADL's, fall risks, home safety risks and hearing or visual             impairment. 5. Diet and physical activities 6. Evidence for depression or mood disorders  The patients weight, height, BMI have been recorded in the chart and visual acuity is per eye clinic.  I have made referrals, counseling and provided education to the patient based review of the above and I have provided the pt with a written personalized care plan for preventive services.  See scanned forms.  Routine anticipatory guidance given to patient.  See health maintenance. Flu yearly Shingles 2012 PNA 2008 Tetanus 2006 Colonoscopy 2011, pt declined f/u.  This is reasonable.  Prostate cancer screening not indicated.  Advance directive encouraged, wife designated.  Cognitive function addressed- see scanned forms- and if abnormal then additional documentation follows.   Elevated Cholesterol: Using medications without problems:yes Muscle aches: no Diet compliance:yes Exercise:yes  Allergic rhinitis, eye watering.  S/p allergy shots in the distant past.   Compliant with meds but still having sx frequently with environmental exposure.    PMH and SH reviewed  Meds, vitals, and allergies reviewed.   ROS: See HPI.  Otherwise negative.    GEN: nad, alert and oriented HEENT: mucous membranes moist, conjunctiva injected, nasal exam slightly irritated, TMs wnl NECK: supple w/o LA CV: rrr. PULM: ctab, no inc wob ABD: soft, +bs EXT: no edema SKIN: no acute rash

## 2012-12-23 NOTE — Assessment & Plan Note (Signed)
Controlled, continue current meds. Labs d/w pt.  

## 2012-12-23 NOTE — Patient Instructions (Addendum)
I would get a flu shot each fall.   Try singulair for your allergies.  Keep taking the pravastatin.  Take care.  Glad to see you.

## 2012-12-24 DIAGNOSIS — H10509 Unspecified blepharoconjunctivitis, unspecified eye: Secondary | ICD-10-CM | POA: Diagnosis not present

## 2012-12-24 DIAGNOSIS — H0019 Chalazion unspecified eye, unspecified eyelid: Secondary | ICD-10-CM | POA: Diagnosis not present

## 2012-12-30 DIAGNOSIS — H10509 Unspecified blepharoconjunctivitis, unspecified eye: Secondary | ICD-10-CM | POA: Diagnosis not present

## 2013-01-13 ENCOUNTER — Telehealth: Payer: Self-pay

## 2013-01-13 NOTE — Telephone Encounter (Signed)
Pt wife request refills on pravastatin. Advised Mrs Sargent refills for year given at 12/23/12. She will ck with [facility] on [address].

## 2013-01-24 DIAGNOSIS — H0019 Chalazion unspecified eye, unspecified eyelid: Secondary | ICD-10-CM | POA: Diagnosis not present

## 2013-01-24 DIAGNOSIS — H10509 Unspecified blepharoconjunctivitis, unspecified eye: Secondary | ICD-10-CM | POA: Diagnosis not present

## 2013-02-23 ENCOUNTER — Ambulatory Visit (INDEPENDENT_AMBULATORY_CARE_PROVIDER_SITE_OTHER): Payer: Medicare Other

## 2013-02-23 DIAGNOSIS — Z23 Encounter for immunization: Secondary | ICD-10-CM

## 2013-07-02 ENCOUNTER — Encounter (HOSPITAL_COMMUNITY): Payer: Self-pay | Admitting: Emergency Medicine

## 2013-07-02 ENCOUNTER — Emergency Department (INDEPENDENT_AMBULATORY_CARE_PROVIDER_SITE_OTHER)
Admission: EM | Admit: 2013-07-02 | Discharge: 2013-07-02 | Disposition: A | Discharge status: Home / Self Care | Payer: Medicare Other | Source: Home / Self Care | Attending: Emergency Medicine | Admitting: Emergency Medicine

## 2013-07-02 DIAGNOSIS — L723 Sebaceous cyst: Secondary | ICD-10-CM | POA: Diagnosis not present

## 2013-07-02 DIAGNOSIS — L089 Local infection of the skin and subcutaneous tissue, unspecified: Secondary | ICD-10-CM

## 2013-07-02 MED ORDER — HYDROCODONE-ACETAMINOPHEN 5-325 MG PO TABS: ORAL_TABLET | ORAL | Status: DC

## 2013-07-02 MED ORDER — SULFAMETHOXAZOLE-TMP DS 800-160 MG PO TABS: 2.0000 | ORAL_TABLET | Freq: Two times a day (BID) | ORAL | Status: DC

## 2013-07-02 NOTE — Discharge Instructions (Signed)

## 2013-07-02 NOTE — ED Notes (Signed)
Pt. Stated, I've had an abscess on my back for years and usually it takes care of itself.  Also Im having left hip pain from a fall 1 week ago, but it makes it hard to sleep and get comfortable but the cyst is the worse.

## 2013-07-02 NOTE — ED Provider Notes (Signed)
  Chief Complaint   Chief Complaint  Patient presents with  . Abscess  . Hip Pain    History of Present Illness   Jeffrey [name] is an 80 year old male who has a long-standing history of a sebaceous cyst on his mid back. Normally he can drain some foul-smelling debris out of this, but over the past week it's become inflamed, red, and draining pus. He denies any fever.  Review of Systems   Other than as noted above, the patient denies any of the following symptoms: Systemic:  No fever, chills or sweats. Skin:  No rash or itching.  [place]   Past medical history, family history, social history, meds, and allergies were reviewed.  No history of diabetes or prior history of abscesses or MRSA. He has a history of hyperlipidemia, allergies, TIA, and BPH. He is allergic to Flonase, ibuprofen, and tamsulosin. Current meds include aspirin, Nasalcrom, Claritin, Singulair, MiraLax, Pravachol, and vitamin C.  Physical Examination     Vital signs:  BP 169/86  Pulse 74  Temp(Src) 98.3 F (36.8 C) (Oral)  Resp 16  SpO2 96% Skin:  There is an inflamed sebaceous cyst in the mid back with surrounding erythema. There is a small amount of purulent drainage.  Skin exam was otherwise normal.  No rash. Ext:  Distal pulses were full, patient has full ROM of all joints.  Procedure   Verbal informed consent was obtained.  The patient was informed of the risks and benefits of the procedure and understands and accepts.  A time out was called and the identity of the patient and correct procedure was confirmed.   The abscess area described above was prepped with Betadine and alcohol and anesthetized with 5 mL of 2% Xylocaine with epinephrine.  Using a #11 scalpel blade, a singe straight incision was made into the area of fluctulence, yielding a large amount of prurulent drainage and squamous debris. The wound cavity was cleaned out as much as possible.  Routine cultures were obtained.  Blunt dissection was  used to break up loculations and the resulting wound cavity was packed with 1/4 inch Iodoform gauze.  A sterile pressure dressing was applied.  Assessment   The encounter diagnosis was Infected sebaceous cyst.  Plan     1.  Meds:  The following meds were prescribed:   New Prescriptions   HYDROCODONE-ACETAMINOPHEN (NORCO/VICODIN) 5-325 MG PER TABLET    1 to 2 tabs every 4 to 6 hours as needed for pain.   SULFAMETHOXAZOLE-TRIMETHOPRIM (BACTRIM DS) 800-160 MG PER TABLET    Take 2 tablets by mouth 2 (two) times daily.    2.  Patient Education/Counseling:  The patient was given appropriate handouts, self care instructions, and instructed in symptomatic relief.    3.  Follow up:  The patient was instructed to leave the dressing in place and return here again in 48 hours for packing removal, or sooner if becoming worse in any way, and given some red flag symptoms such as fever which would prompt immediate return.       Harden Mo, MD 07/02/13 [phone]

## 2013-07-04 ENCOUNTER — Emergency Department (INDEPENDENT_AMBULATORY_CARE_PROVIDER_SITE_OTHER)
Admission: EM | Admit: 2013-07-04 | Discharge: 2013-07-04 | Disposition: A | Discharge status: Home / Self Care | Payer: Medicare Other | Source: Home / Self Care | Attending: Emergency Medicine | Admitting: Emergency Medicine

## 2013-07-04 ENCOUNTER — Encounter (HOSPITAL_COMMUNITY): Payer: Self-pay | Admitting: Emergency Medicine

## 2013-07-04 DIAGNOSIS — IMO0002 Reserved for concepts with insufficient information to code with codable children: Secondary | ICD-10-CM

## 2013-07-04 DIAGNOSIS — L0291 Cutaneous abscess, unspecified: Secondary | ICD-10-CM

## 2013-07-04 DIAGNOSIS — Z888 Allergy status to other drugs, medicaments and biological substances status: Secondary | ICD-10-CM

## 2013-07-04 DIAGNOSIS — Z4800 Encounter for change or removal of nonsurgical wound dressing: Secondary | ICD-10-CM

## 2013-07-04 MED ORDER — CEPHALEXIN 500 MG PO CAPS: 500.0000 mg | ORAL_CAPSULE | Freq: Three times a day (TID) | ORAL | Status: DC

## 2013-07-04 NOTE — ED Provider Notes (Signed)
  Chief Complaint   Chief Complaint  Patient presents with  . Wound Check    History of Present Illness   NIL XIONG is an 80 year old male who had an infected sebaceous cyst on his mid back area. This was incised and drained 2 days ago he returns today for packing removal. He has been taking his antibiotics as prescribed and this is still somewhat tender. He has a difficult time finding a comfortable sleeping position at nighttime. He thinks he may have had a low-grade fever last night, but does not have one this morning. Culture so far is growing out Proteus mirabilis. Sensitivities are not back yet.  Review of Systems   Other than as noted above, the patient denies any of the following symptoms: Systemic:  No fever, chills or sweats. Skin:  No rash or itching.  [place]   Past medical history, family history, social history, meds, and allergies were reviewed.  No history of diabetes or prior history of abscesses or MRSA.   Physical Examination     Vital signs:  BP 139/66  Pulse 80  Temp(Src) 97.5 F (36.4 C) (Oral)  Resp 18  SpO2 98% Skin:  There is an abscess on his back. There still some surrounding erythema, minimal induration or tenderness. Packing is in place.  Skin exam was otherwise normal.  No rash. Ext:  Distal pulses were full, patient has full ROM of all joints.  Procedure   Verbal informed consent was obtained.  The patient was informed of the risks and benefits of the procedure and understands and accepts.  A time out was called and the identity of the patient and correct procedure was confirmed. The packing was removed. Wound cavity appears clear without any drainage. This was then cleansed with saline, antibiotic was applied followed by a sterile, clean, dry dressing.  Assessment   The encounter diagnosis was Abscess.  Culture growing a Proteus-sensitivities pending.  Plan     1.  Meds:  The following meds were prescribed:   Discharge Medication List  as of 07/04/2013  8:25 AM      2.  Patient Education/Counseling:  The patient was given appropriate handouts, self care instructions, and instructed in symptomatic relief.  Instructed in wound care.  3.  Follow up:  The patient was instructed to return if becoming worse in any way, and given some red flag symptoms such as fever which would prompt immediate return.       Harden Mo, MD 07/04/13 [phone]

## 2013-07-04 NOTE — Discharge Instructions (Signed)
Rest and get extra fluids.  Continue wound care.

## 2013-07-04 NOTE — ED Provider Notes (Signed)
Chief Complaint   Chief Complaint  Patient presents with  . Allergic Reaction    History of Present Illness   Jeffrey [name] is an 80 year old male who was just seen here this morning for recheck on abscess. The packing was removed, the wound was cleansed look good. He was instructed in wound care. His wife calls back this evening saying that she thinks she's having some type of reaction to the medication. He's been placed on Septra DS. He's never reacted to this before. He's been on this now for 3 days. She did not mention this to me that this morning. His symptoms consist of chills, nausea, anorexia, malaise, fatigue, generalized weakness, listlessness, and dry lips. He does not have any fever, vomiting, skin rash, itching, shortness of breath, coughing, wheezing, or chest pain. He has not had any abdominal pain, vomiting, or diarrhea. He denies any urinary symptoms.  Review of Systems     Other than as noted above, the patient denies any of the following symptoms: Systemic:  No fever, chills, sweats, fatigue, myalgias, headache, or anorexia. Eye:  No redness, pain or drainage. ENT:  No earache, nasal congestion, rhinorrhea, sinus pressure, or sore throat. Lungs:  No cough, sputum production, wheezing, shortness of breath.  Cardiovascular:  No chest pain, palpitations, or syncope. GI:  No nausea, vomiting, abdominal pain or diarrhea. GU:  No dysuria, frequency, or hematuria. Skin:  No rash or pruritis.   [place]     Past medical history, family history, social history, meds, and allergies were reviewed.    Physical Examination    Vital signs:  BP 149/71  Pulse 80  Temp(Src) 97.6 F (36.4 C) (Oral)  Resp 20  SpO2 95% General:  Alert, in no distress. Eye:  PERRL, full EOMs.  Lids and conjunctivas were normal. ENT:  TMs and canals were normal, without erythema or inflammation.  Nasal mucosa was clear and uncongested, without drainage.  Mucous membranes were moist.  Pharynx was  clear, without exudate or drainage.  There were no oral ulcerations or lesions. Neck:  Supple, no adenopathy, tenderness or mass. Thyroid was normal. Lungs:  No respiratory distress.  Lungs were clear to auscultation, without wheezes, rales or rhonchi.  Breath sounds were clear and equal bilaterally. Heart:  Regular rhythm, without gallops, murmers or rubs. Abdomen:  Soft, flat, and non-tender to palpation.  No hepatosplenomagaly or mass. Skin:  Clear, warm, and dry, without rash or lesions.  Labs    Results for orders placed during the hospital encounter of 07/02/13  CULTURE, ROUTINE-ABSCESS      Result Value Ref Range   Specimen Description ABSCESS     Special Requests ABSCESS ON BACK     Gram Stain       Value: FEW WBC PRESENT, PREDOMINANTLY PMN     MODERATE SQUAMOUS EPITHELIAL CELLS PRESENT     ABUNDANT GRAM NEGATIVE RODS     ABUNDANT GRAM POSITIVE COCCI IN PAIRS     IN CLUSTERS   Culture       Value: MODERATE PROTEUS MIRABILIS     Performed at [facility]   Report Status PENDING      Assessment   The encounter diagnosis was Allergy or intolerance to drug.  I think he may have an allergy or intolerance to the Septra. I don't think his symptoms are due to the infection, since it seems to be getting better. We'll have him stop the Septra and switched to Keflex instead, pending results of  the culture. As can be seen from above, the culture is growing out Proteus.  Plan     1.  Meds:  The following meds were prescribed:   Discharge Medication List as of 07/04/2013  6:02 PM    START taking these medications   Details  cephALEXin (KEFLEX) 500 MG capsule Take 1 capsule (500 mg total) by mouth 3 (three) times daily., Starting 07/04/2013, Until Discontinued, Normal        2.  Patient Education/Counseling:  The patient was given appropriate handouts, self care instructions, and instructed in symptomatic relief.  Patient instructed to rest and get extra fluids.  3.  Follow  up:  The patient was told to follow up here if no better in 3 to 4 days, or sooner if becoming worse in any way, and given some red flag symptoms such as fever, rash, shortness of breath, or chest pain which would prompt immediate return.  Follow up here as necessary.        Harden Mo, MD 07/04/13 [phone]

## 2013-07-04 NOTE — Discharge Instructions (Signed)
Now that the packing has been removed from your abscess, you may bathe and shower as normal.  You should change the dressing at least once a day.  You may change it more often if it becomes soiled with drainage.  Wash the wound well with soap and water, pat dry, apply an antibiotic ointment (Neosporin, Polysporin, or Bacitracin are all OK), then cover with a non-stick dressing such as Telfa with several layers of plain gause over that to absorb drainage.  You may secure this in place with tape or with roll gauze and tape.  Keep the wound covered for until it stops draining.  This usually takes 7 days, but may be longer.  Return for a recheck if you have heavy bleeding, increasing pain, fever, or the wound looks worse.  ° °

## 2013-07-04 NOTE — ED Notes (Signed)
Pt  Reports   He  Is  Here  For  Packing  Removal            Seen  By  Dr  Jake Michaelis

## 2013-07-04 NOTE — ED Notes (Signed)
Call from family member concerned about fever, chills, body aches experiencing since got home from [facility]. Discussed with Dr Jake Michaelis, who wishes to see patient again this PM. Family advised as such, and will bring him in to see Dr Jake Michaelis this PM

## 2013-07-04 NOTE — ED Notes (Signed)
Reports possible allergic reaction.  States having body aches chills and fever.   Pt was seen earlier today.

## 2013-07-05 LAB — CULTURE, ROUTINE-ABSCESS

## 2013-07-05 NOTE — Progress Notes (Signed)
Quick Note:  Results are abnormal as noted, but have been adequately treated. No further action necessary. He has been treated with cephalexin. ______

## 2013-07-06 NOTE — ED Notes (Signed)
Abscess culture: Mod. Proteus Mirabilis.  Pt. initially treated with Septra but was intolerant to medication.  Dr. Jake Michaelis changed him to Keflex after reviewing the culture on 3/2. Roselyn Meier 07/06/2013

## 2013-09-08 DIAGNOSIS — D1801 Hemangioma of skin and subcutaneous tissue: Secondary | ICD-10-CM | POA: Diagnosis not present

## 2013-09-08 DIAGNOSIS — Z85820 Personal history of malignant melanoma of skin: Secondary | ICD-10-CM | POA: Diagnosis not present

## 2013-09-08 DIAGNOSIS — D235 Other benign neoplasm of skin of trunk: Secondary | ICD-10-CM | POA: Diagnosis not present

## 2013-12-18 ENCOUNTER — Other Ambulatory Visit: Payer: Self-pay | Admitting: Family Medicine

## 2013-12-18 DIAGNOSIS — E785 Hyperlipidemia, unspecified: Secondary | ICD-10-CM

## 2013-12-21 ENCOUNTER — Other Ambulatory Visit: Payer: Medicare Other

## 2013-12-22 ENCOUNTER — Other Ambulatory Visit (INDEPENDENT_AMBULATORY_CARE_PROVIDER_SITE_OTHER): Payer: Medicare Other

## 2013-12-22 ENCOUNTER — Other Ambulatory Visit: Payer: Medicare Other

## 2013-12-22 DIAGNOSIS — E785 Hyperlipidemia, unspecified: Secondary | ICD-10-CM

## 2013-12-22 LAB — LIPID PANEL
Cholesterol: 125 mg/dL (ref 0–200)
HDL: 45.3 mg/dL (ref >39.00)
LDL Cholesterol: 49 mg/dL (ref 0–99)
NonHDL: 79.7
Total CHOL/HDL Ratio: 3
Triglycerides: 152 mg/dL — ABNORMAL HIGH (ref 0.0–149.0)
VLDL: 30.4 mg/dL (ref 0.0–40.0)

## 2013-12-22 LAB — COMPREHENSIVE METABOLIC PANEL
ALT: 29 U/L (ref 0–53)
AST: 31 U/L (ref 0–37)
Albumin: 4.4 g/dL (ref 3.5–5.2)
Alkaline Phosphatase: 45 U/L (ref 39–117)
BUN: 12 mg/dL (ref 6–23)
CO2: 27 mEq/L (ref 19–32)
Calcium: 9.5 mg/dL (ref 8.4–10.5)
Chloride: 104 mEq/L (ref 96–112)
Creatinine, Ser: 0.9 mg/dL (ref 0.4–1.5)
GFR: 81.48 mL/min (ref >60.00)
Glucose, Bld: 84 mg/dL (ref 70–99)
Potassium: 4.1 mEq/L (ref 3.5–5.1)
Sodium: 137 mEq/L (ref 135–145)
Total Bilirubin: 0.9 mg/dL (ref 0.2–1.2)
Total Protein: 7.1 g/dL (ref 6.0–8.3)

## 2013-12-29 ENCOUNTER — Encounter: Payer: Self-pay | Admitting: Family Medicine

## 2013-12-29 ENCOUNTER — Encounter (INDEPENDENT_AMBULATORY_CARE_PROVIDER_SITE_OTHER): Payer: Self-pay

## 2013-12-29 ENCOUNTER — Ambulatory Visit (INDEPENDENT_AMBULATORY_CARE_PROVIDER_SITE_OTHER): Payer: Medicare Other | Admitting: Family Medicine

## 2013-12-29 VITALS — BP 130/68 | HR 75 | Temp 97.4°F | Ht 66.25 in | Wt 151.2 lb

## 2013-12-29 DIAGNOSIS — Z7189 Other specified counseling: Secondary | ICD-10-CM | POA: Insufficient documentation

## 2013-12-29 DIAGNOSIS — Z0000 Encounter for general adult medical examination without abnormal findings: Secondary | ICD-10-CM

## 2013-12-29 DIAGNOSIS — E785 Hyperlipidemia, unspecified: Secondary | ICD-10-CM

## 2013-12-29 HISTORY — DX: Other specified counseling: Z71.89

## 2013-12-29 MED ORDER — PRAVASTATIN SODIUM 40 MG PO TABS: 40.0000 mg | ORAL_TABLET | Freq: Every day | ORAL | Status: DC

## 2013-12-29 NOTE — Progress Notes (Signed)
Pre visit review using our clinic review tool, if applicable. No additional management support is needed unless otherwise documented below in the visit note.  I have personally reviewed the Medicare Annual Wellness questionnaire and have noted 1. The patient's medical and social history 2. Their use of alcohol, tobacco or illicit drugs 3. Their current medications and supplements 4. The patient's functional ability including ADL's, fall risks, home safety risks and hearing or visual             impairment. 5. Diet and physical activities 6. Evidence for depression or mood disorders  The patients weight, height, BMI have been recorded in the chart and visual acuity is per eye clinic.  I have made referrals, counseling and provided education to the patient based review of the above and I have provided the pt with a written personalized care plan for preventive services.  Provider list updated- see scanned forms.  Routine anticipatory guidance given to patient.  See health maintenance.  Flu 2015 Shingles 2012 PNA 2008 Tetanus 2006 Colonoscopy 2011, no more indicated due to age Prostate cancer screening declined due to age, this is reasonable, d/w pt.  Advance directive- wife designated if patient were incapacitated.  Cognitive function addressed- see scanned forms- and if abnormal then additional documentation follows.  Normal memory testing today.   Elevated Cholesterol: Using medications without problems:yes Muscle aches: no Diet compliance:yes, a lot of vegetables Exercise:yes, walking  PMH and SH reviewed  Meds, vitals, and allergies reviewed.   ROS: See HPI.  Otherwise negative.    GEN: nad, alert and oriented HEENT: mucous membranes moist NECK: supple w/o LA CV: rrr. PULM: ctab, no inc wob ABD: soft, +bs EXT: no edema SKIN: no acute rash, old seb cyst (prev I&D done) scarred down, appears to have healed well. On mid lower back.

## 2013-12-29 NOTE — Assessment & Plan Note (Signed)
Controlled, continue as is.  D/w pt.  He agrees. Labs d/w pt.

## 2013-12-29 NOTE — Patient Instructions (Signed)
Take care.  Glad to see you.  Recheck in about 1 year, sooner if needed.  

## 2013-12-29 NOTE — Assessment & Plan Note (Signed)
Flu 2015 Shingles 2012 PNA 2008 Tetanus 2006 Colonoscopy 2011, no more indicated due to age Prostate cancer screening declined due to age, this is reasonable, d/w pt.  Advance directive- wife designated if patient were incapacitated.  Cognitive function addressed- see scanned forms- and if abnormal then additional documentation follows.  Normal memory testing today.

## 2014-02-14 ENCOUNTER — Encounter: Payer: Self-pay | Admitting: Gastroenterology

## 2014-07-11 ENCOUNTER — Other Ambulatory Visit: Payer: Self-pay | Admitting: Dermatology

## 2014-07-11 DIAGNOSIS — L570 Actinic keratosis: Secondary | ICD-10-CM | POA: Diagnosis not present

## 2014-07-11 DIAGNOSIS — L821 Other seborrheic keratosis: Secondary | ICD-10-CM | POA: Diagnosis not present

## 2014-07-11 DIAGNOSIS — C44599 Other specified malignant neoplasm of skin of other part of trunk: Secondary | ICD-10-CM | POA: Diagnosis not present

## 2014-07-11 DIAGNOSIS — B078 Other viral warts: Secondary | ICD-10-CM | POA: Diagnosis not present

## 2014-07-11 DIAGNOSIS — D235 Other benign neoplasm of skin of trunk: Secondary | ICD-10-CM | POA: Diagnosis not present

## 2014-09-11 DIAGNOSIS — L570 Actinic keratosis: Secondary | ICD-10-CM | POA: Diagnosis not present

## 2014-09-11 DIAGNOSIS — D225 Melanocytic nevi of trunk: Secondary | ICD-10-CM | POA: Diagnosis not present

## 2014-09-11 DIAGNOSIS — Z85820 Personal history of malignant melanoma of skin: Secondary | ICD-10-CM | POA: Diagnosis not present

## 2014-09-11 DIAGNOSIS — Z08 Encounter for follow-up examination after completed treatment for malignant neoplasm: Secondary | ICD-10-CM | POA: Diagnosis not present

## 2014-09-11 DIAGNOSIS — L814 Other melanin hyperpigmentation: Secondary | ICD-10-CM | POA: Diagnosis not present

## 2014-10-05 DIAGNOSIS — H00014 Hordeolum externum left upper eyelid: Secondary | ICD-10-CM | POA: Diagnosis not present

## 2014-10-05 DIAGNOSIS — H01004 Unspecified blepharitis left upper eyelid: Secondary | ICD-10-CM | POA: Diagnosis not present

## 2014-10-05 DIAGNOSIS — H01005 Unspecified blepharitis left lower eyelid: Secondary | ICD-10-CM | POA: Diagnosis not present

## 2014-10-05 DIAGNOSIS — H01002 Unspecified blepharitis right lower eyelid: Secondary | ICD-10-CM | POA: Diagnosis not present

## 2014-12-22 ENCOUNTER — Other Ambulatory Visit: Payer: Self-pay | Admitting: Family Medicine

## 2014-12-22 DIAGNOSIS — E785 Hyperlipidemia, unspecified: Secondary | ICD-10-CM

## 2014-12-26 ENCOUNTER — Other Ambulatory Visit (INDEPENDENT_AMBULATORY_CARE_PROVIDER_SITE_OTHER): Payer: Medicare Other

## 2014-12-26 ENCOUNTER — Other Ambulatory Visit: Payer: Self-pay | Admitting: Family Medicine

## 2014-12-26 DIAGNOSIS — E785 Hyperlipidemia, unspecified: Secondary | ICD-10-CM

## 2014-12-26 LAB — LIPID PANEL
Cholesterol: 123 mg/dL (ref 0–200)
HDL: 41.9 mg/dL (ref >39.00)
LDL Cholesterol: 56 mg/dL (ref 0–99)
NonHDL: 81.21
Total CHOL/HDL Ratio: 3
Triglycerides: 126 mg/dL (ref 0.0–149.0)
VLDL: 25.2 mg/dL (ref 0.0–40.0)

## 2014-12-26 LAB — COMPREHENSIVE METABOLIC PANEL
ALT: 27 U/L (ref 0–53)
AST: 29 U/L (ref 0–37)
Albumin: 4.2 g/dL (ref 3.5–5.2)
Alkaline Phosphatase: 45 U/L (ref 39–117)
BUN: 11 mg/dL (ref 6–23)
CO2: 29 mEq/L (ref 19–32)
Calcium: 9.3 mg/dL (ref 8.4–10.5)
Chloride: 105 mEq/L (ref 96–112)
Creatinine, Ser: 0.88 mg/dL (ref 0.40–1.50)
GFR: 87.71 mL/min (ref >60.00)
Glucose, Bld: 96 mg/dL (ref 70–99)
Potassium: 4.2 mEq/L (ref 3.5–5.1)
Sodium: 139 mEq/L (ref 135–145)
Total Bilirubin: 0.6 mg/dL (ref 0.2–1.2)
Total Protein: 6.7 g/dL (ref 6.0–8.3)

## 2015-01-01 ENCOUNTER — Ambulatory Visit (INDEPENDENT_AMBULATORY_CARE_PROVIDER_SITE_OTHER): Payer: Medicare Other | Admitting: Family Medicine

## 2015-01-01 ENCOUNTER — Encounter: Payer: Self-pay | Admitting: Family Medicine

## 2015-01-01 VITALS — BP 132/70 | HR 71 | Temp 97.4°F | Ht 66.0 in | Wt 149.5 lb

## 2015-01-01 DIAGNOSIS — Z23 Encounter for immunization: Secondary | ICD-10-CM | POA: Diagnosis not present

## 2015-01-01 DIAGNOSIS — E785 Hyperlipidemia, unspecified: Secondary | ICD-10-CM

## 2015-01-01 DIAGNOSIS — Z0000 Encounter for general adult medical examination without abnormal findings: Secondary | ICD-10-CM

## 2015-01-01 MED ORDER — PRAVASTATIN SODIUM 40 MG PO TABS: 40.0000 mg | ORAL_TABLET | Freq: Every day | ORAL | Status: DC

## 2015-01-01 NOTE — Patient Instructions (Signed)
Take care.  Don't change your meds for now.  Glad to see you.  I would get a flu shot each fall.   Recheck in about 1 year.   Your memory testing was normal today.

## 2015-01-01 NOTE — Progress Notes (Signed)
Pre visit review using our clinic review tool, if applicable. No additional management support is needed unless otherwise documented below in the visit note.  I have personally reviewed the Medicare Annual Wellness questionnaire and have noted 1. The patient's medical and social history 2. Their use of alcohol, tobacco or illicit drugs 3. Their current medications and supplements 4. The patient's functional ability including ADL's, fall risks, home safety risks and hearing or visual             impairment. 5. Diet and physical activities 6. Evidence for depression or mood disorders  The patients weight, height, BMI have been recorded in the chart and visual acuity is per eye clinic.  I have made referrals, counseling and provided education to the patient based review of the above and I have provided the pt with a written personalized care plan for preventive services.  Provider list updated- see scanned forms.  Routine anticipatory guidance given to patient.  See health maintenance.  Flu encouraged Shingles 2012 PNA updated 2016 Tetanus 2006 Colon NA due to age,  declined due to age.  This is reasonable. D/w pt.  Prostate cancer screening declined due to age.  This is reasonable.  D/w pt.  Advance directive- wife designated if patient were incapacitated.  Cognitive function addressed- see scanned forms- and if abnormal then additional documentation follows.   Elevated Cholesterol: Using medications without problems:yes Muscle aches: no Diet compliance:yes Exercise:yes H/o TIA noted.  Labs d/w pt.   No new neuro sx.   PMH and SH reviewed  Meds, vitals, and allergies reviewed.   ROS: See HPI.  Otherwise negative.    GEN: nad, alert and oriented HEENT: mucous membranes moist NECK: supple w/o LA CV: rrr. PULM: ctab, no inc wob ABD: soft, +bs EXT: no edema SKIN: no acute rash

## 2015-01-02 NOTE — Assessment & Plan Note (Signed)
Flu encouraged Shingles 2012 PNA updated 2016 Tetanus 2006 Colon NA due to age,  declined due to age.  This is reasonable. D/w pt.  Prostate cancer screening declined due to age.  This is reasonable.  D/w pt.  Advance directive- wife designated if patient were incapacitated.  Cognitive function addressed- see scanned forms- and if abnormal then additional documentation follows.

## 2015-01-02 NOTE — Assessment & Plan Note (Signed)
Labs d/w pt. Controlled, continue as is with statin.  Continue diet and exercise.  He agrees.

## 2015-01-10 DIAGNOSIS — H5203 Hypermetropia, bilateral: Secondary | ICD-10-CM | POA: Diagnosis not present

## 2015-01-10 DIAGNOSIS — H01002 Unspecified blepharitis right lower eyelid: Secondary | ICD-10-CM | POA: Diagnosis not present

## 2015-01-10 DIAGNOSIS — H02052 Trichiasis without entropian right lower eyelid: Secondary | ICD-10-CM | POA: Diagnosis not present

## 2015-01-10 DIAGNOSIS — H2513 Age-related nuclear cataract, bilateral: Secondary | ICD-10-CM | POA: Diagnosis not present

## 2015-02-07 ENCOUNTER — Ambulatory Visit (INDEPENDENT_AMBULATORY_CARE_PROVIDER_SITE_OTHER): Payer: Medicare Other

## 2015-02-07 DIAGNOSIS — Z23 Encounter for immunization: Secondary | ICD-10-CM

## 2015-08-07 DIAGNOSIS — H10503 Unspecified blepharoconjunctivitis, bilateral: Secondary | ICD-10-CM | POA: Diagnosis not present

## 2015-09-10 DIAGNOSIS — L814 Other melanin hyperpigmentation: Secondary | ICD-10-CM | POA: Diagnosis not present

## 2015-09-10 DIAGNOSIS — D1801 Hemangioma of skin and subcutaneous tissue: Secondary | ICD-10-CM | POA: Diagnosis not present

## 2015-09-10 DIAGNOSIS — D235 Other benign neoplasm of skin of trunk: Secondary | ICD-10-CM | POA: Diagnosis not present

## 2015-09-10 DIAGNOSIS — L570 Actinic keratosis: Secondary | ICD-10-CM | POA: Diagnosis not present

## 2015-09-10 DIAGNOSIS — L820 Inflamed seborrheic keratosis: Secondary | ICD-10-CM | POA: Diagnosis not present

## 2015-10-02 DIAGNOSIS — H2513 Age-related nuclear cataract, bilateral: Secondary | ICD-10-CM | POA: Diagnosis not present

## 2015-10-02 DIAGNOSIS — H04123 Dry eye syndrome of bilateral lacrimal glands: Secondary | ICD-10-CM | POA: Diagnosis not present

## 2015-10-02 DIAGNOSIS — H25013 Cortical age-related cataract, bilateral: Secondary | ICD-10-CM | POA: Diagnosis not present

## 2015-10-02 DIAGNOSIS — H01001 Unspecified blepharitis right upper eyelid: Secondary | ICD-10-CM | POA: Diagnosis not present

## 2015-11-05 ENCOUNTER — Ambulatory Visit (INDEPENDENT_AMBULATORY_CARE_PROVIDER_SITE_OTHER): Payer: Medicare Other | Admitting: Family Medicine

## 2015-11-05 ENCOUNTER — Encounter: Payer: Self-pay | Admitting: Family Medicine

## 2015-11-05 VITALS — BP 142/64 | HR 75 | Temp 97.5°F | Wt 152.5 lb

## 2015-11-05 DIAGNOSIS — M25572 Pain in left ankle and joints of left foot: Secondary | ICD-10-CM | POA: Diagnosis not present

## 2015-11-05 NOTE — Progress Notes (Signed)
Pre visit review using our clinic review tool, if applicable. No additional management support is needed unless otherwise documented below in the visit note.  L foot complaint.  He got up at night and hit his foot on a chair.  Hit and scraped his L 2nd toe, on the dorsum.  Then the chair whipped around and hit the back of the L foot.  Posterior ankle bruising.  Incident was about 1 week ago.  He didn't fall; he caught himself on the way down.  Pain the L foot is getting some better.  Can still bear weight.  He has noted some clicking with walking.   Meds, vitals, and allergies reviewed.   ROS: Per HPI unless specifically indicated in ROS section   nad Posterior ankle bruised minimally  Small abrasion on the dorsum on L 2nd toe. Med and lat mal not ttp, achilles not ttp.  Normal ankle ROM. Mortise is still intact 5th MT and mid foot in general not ttp Feels better in ASO, placed at [place].

## 2015-11-05 NOTE — Patient Instructions (Signed)
Use the ankle brace for now when up and walking.  Take it off otherwise.  Use an ice pack for 5 minutes as needed for swelling.  Elevate your leg when possible.  Walk as tolerated.  Gradually wean off the brace.  Take care.  Glad to see you.

## 2015-11-07 NOTE — Assessment & Plan Note (Signed)
Feels better in ASO, placed at [place].  Continue use for now.  Low speed injury, given the exam and timeframe, doesn't need imaging at this point.  Update me as needed.  Gradually wean from the brace when feeling better.    Also- he had noted some occ memory lapses and we'll discuss at his upcoming OV.

## 2015-11-11 ENCOUNTER — Other Ambulatory Visit: Payer: Self-pay | Admitting: Family Medicine

## 2015-11-11 DIAGNOSIS — E785 Hyperlipidemia, unspecified: Secondary | ICD-10-CM

## 2015-11-11 DIAGNOSIS — R413 Other amnesia: Secondary | ICD-10-CM

## 2015-12-27 ENCOUNTER — Other Ambulatory Visit (INDEPENDENT_AMBULATORY_CARE_PROVIDER_SITE_OTHER): Payer: Medicare Other

## 2015-12-27 DIAGNOSIS — E785 Hyperlipidemia, unspecified: Secondary | ICD-10-CM

## 2015-12-27 DIAGNOSIS — R413 Other amnesia: Secondary | ICD-10-CM

## 2015-12-27 LAB — CBC WITH DIFFERENTIAL/PLATELET
Basophils Absolute: 0 10*3/uL (ref 0.0–0.1)
Basophils Relative: 0.8 % (ref 0.0–3.0)
Eosinophils Absolute: 0.1 10*3/uL (ref 0.0–0.7)
Eosinophils Relative: 1 % (ref 0.0–5.0)
HCT: 43.9 % (ref 39.0–52.0)
Hemoglobin: 15.2 g/dL (ref 13.0–17.0)
Lymphocytes Relative: 27.5 % (ref 12.0–46.0)
Lymphs Abs: 1.4 10*3/uL (ref 0.7–4.0)
MCHC: 34.6 g/dL (ref 30.0–36.0)
MCV: 93.8 fl (ref 78.0–100.0)
Monocytes Absolute: 0.5 10*3/uL (ref 0.1–1.0)
Monocytes Relative: 10.6 % (ref 3.0–12.0)
Neutro Abs: 3.1 10*3/uL (ref 1.4–7.7)
Neutrophils Relative %: 60.1 % (ref 43.0–77.0)
Platelets: 175 10*3/uL (ref 150.0–400.0)
RBC: 4.68 Mil/uL (ref 4.22–5.81)
RDW: 12.7 % (ref 11.5–15.5)
WBC: 5.2 10*3/uL (ref 4.0–10.5)

## 2015-12-27 LAB — LIPID PANEL
Cholesterol: 136 mg/dL (ref 0–200)
HDL: 43 mg/dL (ref >39.00)
LDL Cholesterol: 55 mg/dL (ref 0–99)
NonHDL: 93.43
Total CHOL/HDL Ratio: 3
Triglycerides: 192 mg/dL — ABNORMAL HIGH (ref 0.0–149.0)
VLDL: 38.4 mg/dL (ref 0.0–40.0)

## 2015-12-27 LAB — COMPREHENSIVE METABOLIC PANEL
ALT: 29 U/L (ref 0–53)
AST: 27 U/L (ref 0–37)
Albumin: 4.3 g/dL (ref 3.5–5.2)
Alkaline Phosphatase: 44 U/L (ref 39–117)
BUN: 11 mg/dL (ref 6–23)
CO2: 27 mEq/L (ref 19–32)
Calcium: 8.9 mg/dL (ref 8.4–10.5)
Chloride: 104 mEq/L (ref 96–112)
Creatinine, Ser: 0.96 mg/dL (ref 0.40–1.50)
GFR: 79.14 mL/min (ref >60.00)
Glucose, Bld: 95 mg/dL (ref 70–99)
Potassium: 4.5 mEq/L (ref 3.5–5.1)
Sodium: 138 mEq/L (ref 135–145)
Total Bilirubin: 0.6 mg/dL (ref 0.2–1.2)
Total Protein: 6.7 g/dL (ref 6.0–8.3)

## 2015-12-27 LAB — TSH: TSH: 1.57 u[IU]/mL (ref 0.35–4.50)

## 2015-12-27 LAB — VITAMIN B12: Vitamin B-12: 677 pg/mL (ref 211–911)

## 2016-01-03 ENCOUNTER — Encounter: Payer: Self-pay | Admitting: Family Medicine

## 2016-01-03 ENCOUNTER — Ambulatory Visit (INDEPENDENT_AMBULATORY_CARE_PROVIDER_SITE_OTHER): Payer: Medicare Other | Admitting: Family Medicine

## 2016-01-03 VITALS — BP 152/70 | HR 68 | Temp 97.7°F | Ht 66.0 in | Wt 154.2 lb

## 2016-01-03 DIAGNOSIS — J309 Allergic rhinitis, unspecified: Secondary | ICD-10-CM | POA: Diagnosis not present

## 2016-01-03 DIAGNOSIS — Z23 Encounter for immunization: Secondary | ICD-10-CM

## 2016-01-03 DIAGNOSIS — E785 Hyperlipidemia, unspecified: Secondary | ICD-10-CM | POA: Diagnosis not present

## 2016-01-03 DIAGNOSIS — Z7189 Other specified counseling: Secondary | ICD-10-CM

## 2016-01-03 DIAGNOSIS — Z0001 Encounter for general adult medical examination with abnormal findings: Secondary | ICD-10-CM

## 2016-01-03 DIAGNOSIS — R413 Other amnesia: Secondary | ICD-10-CM

## 2016-01-03 DIAGNOSIS — Z0000 Encounter for general adult medical examination without abnormal findings: Secondary | ICD-10-CM

## 2016-01-03 HISTORY — DX: Other amnesia: R41.3

## 2016-01-03 MED ORDER — PRAVASTATIN SODIUM 40 MG PO TABS: 40.0000 mg | ORAL_TABLET | Freq: Every day | ORAL | 3 refills | Status: DC

## 2016-01-03 NOTE — Progress Notes (Signed)
I have personally reviewed the Medicare Annual Wellness questionnaire and have noted 1. The patient's medical and social history 2. Their use of alcohol, tobacco or illicit drugs 3. Their current medications and supplements 4. The patient's functional ability including ADL's, fall risks, home safety risks and hearing or visual             impairment. 5. Diet and physical activities 6. Evidence for depression or mood disorders  The patients weight, height, BMI have been recorded in the chart and visual acuity is per eye clinic.  I have made referrals, counseling and provided education to the patient based review of the above and I have provided the pt with a written personalized care plan for preventive services.  Provider list updated- see scanned forms.  Routine anticipatory guidance given to patient.  See health maintenance.  Flu 2017 Shingles 2012 PNA UTD Tetanus 2006 Colonoscopy NA due to age, d/w pt.  He agrees.  Prostate cancer screening NA due to age, d/w pt.  He agrees.  Advance directive -wife designated if patient were incapacitated. We talked about his MOST form in detail. Signed at the office visit, original copy was sent with patient, it was scanned in the chart for reference.Marland Kitchen He was still full scope of treatment, assuming he had a treatable condition. He would not prolonged ongoing futile care. Cognitive function addressed- see scanned forms- and if abnormal then additional documentation follows.   Elevated Cholesterol: Using medications without problems:yes Muscle aches: no Diet compliance:yes Exercise:yes He wasn't sure about his statin dose, d/w pt.  List given to patient.  Labs d/w pt.   Memory change noted by patient.  He has more trouble with short-term recall, with recent events. His long term memory is still good. He has noted the change, his wife has noted a change. Recent labs to evaluate for reversible causes were negative or unremarkable. We discussed getting  a head CT at this point. He has no focal neurologic changes otherwise.  Allergic rhinitis. He is changed over to [facility]. This is helped some. Discussed with patient. No adverse event on medicine.  PMH and SH reviewed  Meds, vitals, and allergies reviewed.   ROS: Per HPI.  Unless specifically indicated otherwise in HPI, the patient denies:  General: fever. Eyes: acute vision changes ENT: sore throat Cardiovascular: chest pain Respiratory: SOB GI: vomiting GU: dysuria Musculoskeletal: acute back pain Derm: acute rash Neuro: acute motor dysfunction Psych: worsening mood Endocrine: polydipsia Heme: bleeding Allergy: hayfever  GEN: nad, alert and oriented-normal brief memory testing at the office visit, 3 out of 3 on recall. HEENT: mucous membranes moist NECK: supple w/o LA CV: rrr. PULM: ctab, no inc wob ABD: soft, +bs EXT: no edema SKIN: no acute rash

## 2016-01-03 NOTE — Patient Instructions (Signed)
Your labs are fine.  I would continue the cholesterol medicine (pravastatin).   Since you have noted memory loss, I would get the head CT done.  Jeffrey [name] will call about your referral. Your memory testing was okay here today.  I wouldn't change anything else yet.   Take care.  Glad to see you.

## 2016-01-03 NOTE — Assessment & Plan Note (Signed)
Advance directive -wife designated if patient were incapacitated. We talked about his MOST form in detail. Signed at the office visit, original copy was sent with patient, it was scanned in the chart for reference.Marland Kitchen He was still full scope of treatment, assuming he had a treatable condition. He would not prolonged ongoing futile care.

## 2016-01-03 NOTE — Assessment & Plan Note (Signed)
Flu 2017 Shingles 2012 PNA UTD Tetanus 2006 Colonoscopy NA due to age, d/w pt.  He agrees.  Prostate cancer screening NA due to age, d/w pt.  He agrees.  Advance directive -wife designated if patient were incapacitated. We talked about his MOST form in detail. Signed at the office visit, original copy was sent with patient, it was scanned in the chart for reference.Marland Kitchen He was still full scope of treatment, assuming he had a treatable condition. He would not prolonged ongoing futile care. Cognitive function addressed- see scanned forms- and if abnormal then additional documentation follows.

## 2016-01-03 NOTE — Assessment & Plan Note (Signed)
I clarified with wife. Patient has still been on statin. Continue as is. Lipids are reasonable. Discussed with patient.

## 2016-01-03 NOTE — Progress Notes (Signed)
Pre visit review using our clinic review tool, if applicable. No additional management support is needed unless otherwise documented below in the visit note. 

## 2016-01-03 NOTE — Assessment & Plan Note (Signed)
At this point, reasonable to get a head CT done. Discussed with patient. I will await that. We will go from there. He does not have severe memory loss, based on his brief testing today. He has no red flag events. Still okay for outpatient follow-up.

## 2016-01-03 NOTE — Assessment & Plan Note (Signed)
Continue Flonase. He agrees.

## 2016-01-04 ENCOUNTER — Encounter: Payer: Self-pay | Admitting: *Deleted

## 2016-01-14 ENCOUNTER — Ambulatory Visit (INDEPENDENT_AMBULATORY_CARE_PROVIDER_SITE_OTHER)
Admission: RE | Admit: 2016-01-14 | Discharge: 2016-01-14 | Disposition: A | Discharge status: Home / Self Care | Payer: Medicare Other | Source: Ambulatory Visit | Attending: Family Medicine | Admitting: Family Medicine

## 2016-01-14 DIAGNOSIS — R413 Other amnesia: Secondary | ICD-10-CM | POA: Diagnosis not present

## 2016-01-18 ENCOUNTER — Encounter: Payer: Self-pay | Admitting: Family Medicine

## 2016-01-18 ENCOUNTER — Ambulatory Visit (INDEPENDENT_AMBULATORY_CARE_PROVIDER_SITE_OTHER): Payer: Medicare Other | Admitting: Family Medicine

## 2016-01-18 DIAGNOSIS — R413 Other amnesia: Secondary | ICD-10-CM | POA: Diagnosis not present

## 2016-01-18 NOTE — Progress Notes (Signed)
Pre visit review using our clinic review tool, if applicable. No additional management support is needed unless otherwise documented below in the visit note. 

## 2016-01-18 NOTE — Patient Instructions (Signed)
Recheck memory at a visit in about 3 months.  Ask for a 30 minute visit.  If you have troubles or concerns in the meantime then let me know.   Take care.  Glad to see you.

## 2016-01-20 NOTE — Assessment & Plan Note (Signed)
He was most worried about his previous scalp surgery. I told him that the blood supply to the scalp was completely separate from the blood supply to his brain. I cannot imagine that procedure would have in any way affected his memory, other than the fact that it was performed under anesthesia. Given that the procedure was a long time ago, I doubt that it is caused his more recent memory symptoms. His symptoms are still mild. He was relieved with the discussion. At this point he did not want more aggressive treatment. We agreed to recheck his memory in about 3 months. See after visit summary. All questions answered. >25 minutes spent in face to face time with patient, >50% spent in counselling or coordination of care.

## 2016-01-20 NOTE — Progress Notes (Signed)
F/u for discussion re: CT and prev w/u re: memory changes.   Patient does admit to some relative decrease in short-term recall. His long-term memory is still intact. No red flag events. Is not getting lost. Not leaving pot on the stove, etc. Previous lab workup for reversible causes was unremarkable. CT with the following findings noted:  1. No definitive explanation for memory changes. No atrophy or ischemic injury. 2. Left convexity and interhemispheric hygroma without significant mass effect. 3. Incidental pericallosal lipoma.  All discussed with patient.  Meds, vitals, and allergies reviewed.   ROS: Per HPI unless specifically indicated in ROS section   GEN: nad, alert and oriented HEENT: mucous membranes moist NECK: supple w/o LA CV: rrr. PULM: ctab, no inc wob ABD: soft, +bs EXT: no edema SKIN: no acute rash, scalp with old scarring noted

## 2016-02-12 DIAGNOSIS — H25013 Cortical age-related cataract, bilateral: Secondary | ICD-10-CM | POA: Diagnosis not present

## 2016-02-12 DIAGNOSIS — H2513 Age-related nuclear cataract, bilateral: Secondary | ICD-10-CM | POA: Diagnosis not present

## 2016-02-12 DIAGNOSIS — H04123 Dry eye syndrome of bilateral lacrimal glands: Secondary | ICD-10-CM | POA: Diagnosis not present

## 2016-02-12 DIAGNOSIS — H524 Presbyopia: Secondary | ICD-10-CM | POA: Diagnosis not present

## 2016-08-22 ENCOUNTER — Encounter: Payer: Self-pay | Admitting: Family Medicine

## 2016-08-22 ENCOUNTER — Ambulatory Visit (INDEPENDENT_AMBULATORY_CARE_PROVIDER_SITE_OTHER): Payer: Medicare Other | Admitting: Family Medicine

## 2016-08-22 DIAGNOSIS — M25511 Pain in right shoulder: Secondary | ICD-10-CM | POA: Diagnosis not present

## 2016-08-22 NOTE — Patient Instructions (Signed)
Use ibuprofen with food, ice or heat, and the exercises without weights.  Update me if not better.
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
## 2016-08-22 NOTE — Progress Notes (Signed)
Pre visit review using our clinic review tool, if applicable. No additional management support is needed unless otherwise documented below in the visit note. 

## 2016-08-24 DIAGNOSIS — M25511 Pain in right shoulder: Secondary | ICD-10-CM

## 2016-08-24 HISTORY — DX: Pain in right shoulder: M25.511

## 2016-08-24 NOTE — Assessment & Plan Note (Addendum)
He has typical rotator cuff symptoms, likely irritation but not full rotator cuff tear. Discussed with patient about anatomy. Handout given regarding exercises and anatomy. All explained in detail. I think he can rehab his shoulder with home exercise program. No imaging needed at this point. If not improved he will let me know. He agrees. Okay to use small doses of ibuprofen with food, or he can take Tylenol, he can try ice and heat. Advised patient not to exercise with weights.

## 2016-09-08 DIAGNOSIS — D225 Melanocytic nevi of trunk: Secondary | ICD-10-CM | POA: Diagnosis not present

## 2016-09-08 DIAGNOSIS — L821 Other seborrheic keratosis: Secondary | ICD-10-CM | POA: Diagnosis not present

## 2016-09-08 DIAGNOSIS — Z85820 Personal history of malignant melanoma of skin: Secondary | ICD-10-CM | POA: Diagnosis not present

## 2016-09-08 DIAGNOSIS — L814 Other melanin hyperpigmentation: Secondary | ICD-10-CM | POA: Diagnosis not present

## 2016-09-08 DIAGNOSIS — D1800 Hemangioma unspecified site: Secondary | ICD-10-CM | POA: Diagnosis not present

## 2016-10-26 ENCOUNTER — Encounter (HOSPITAL_COMMUNITY): Payer: Self-pay | Admitting: Emergency Medicine

## 2016-10-26 ENCOUNTER — Emergency Department (HOSPITAL_COMMUNITY): Payer: Medicare Other

## 2016-10-26 ENCOUNTER — Emergency Department (HOSPITAL_COMMUNITY)
Admission: EM | Admit: 2016-10-26 | Discharge: 2016-10-26 | Disposition: A | Discharge status: Home / Self Care | Payer: Medicare Other | Attending: Emergency Medicine | Admitting: Emergency Medicine

## 2016-10-26 DIAGNOSIS — Z87891 Personal history of nicotine dependence: Secondary | ICD-10-CM | POA: Insufficient documentation

## 2016-10-26 DIAGNOSIS — M25511 Pain in right shoulder: Secondary | ICD-10-CM

## 2016-10-26 DIAGNOSIS — Z79899 Other long term (current) drug therapy: Secondary | ICD-10-CM | POA: Diagnosis not present

## 2016-10-26 DIAGNOSIS — R1310 Dysphagia, unspecified: Secondary | ICD-10-CM | POA: Diagnosis not present

## 2016-10-26 DIAGNOSIS — Z7982 Long term (current) use of aspirin: Secondary | ICD-10-CM | POA: Diagnosis not present

## 2016-10-26 DIAGNOSIS — Z8673 Personal history of transient ischemic attack (TIA), and cerebral infarction without residual deficits: Secondary | ICD-10-CM | POA: Insufficient documentation

## 2016-10-26 DIAGNOSIS — Z85828 Personal history of other malignant neoplasm of skin: Secondary | ICD-10-CM | POA: Diagnosis not present

## 2016-10-26 NOTE — Discharge Instructions (Signed)
X-ray of the right shoulder without any significant bony findings. Follow-up with your primary care doctor for this. May need some physical therapy. For the swallowing difficulties make an appointment with GI medicine. In the meantime chew food carefully cut up into small bites. Return for any new or worse symptoms.

## 2016-10-26 NOTE — ED Provider Notes (Signed)
[facility] DEPT Provider Note   CSN: [id number] Arrival date & time: 10/26/16  0720     History   Chief Complaint Chief Complaint  Patient presents with  . Dysphagia    HPI Jeffrey [name] is a 83 y.o. male.  Patient on Friday choked on a hot dog. Eventually vomited up. Since that time he's been hoarse and had some difficulty swallowing. Patient handling saliva fine liquids fine and not handling soft diet fine. One time in the past few weeks ago he also had a barbecue sandwich they get stuck going down. No feeling of any foreign body at this point in time. Patient also with complaint of right shoulder pain. It seems primary care doctor. Thought maybe there was a mild rotator cuff injury. Patient without any formal follow-up for x-rays. Patient states that it's still bothering him.      Past Medical History:  Diagnosis Date  . Adenomatous polyp of colon 1995    w/ HGD  . Allergic rhinitis   . BPH (benign prostatic hyperplasia)   . Diverticulosis 09/21/2000  . ED (erectile dysfunction)   . Hyperlipemia 05/05/1988  . Schamberg's purpura    2012- eval by Dr. Allyson Sabal  . Sebaceous cyst   . Skin cancer 05/05/1998   hx of Scalp-melanoma  . TIA (transient ischemic attack)     Patient Active Problem List   Diagnosis Date Noted  . Shoulder pain, right 08/24/2016  . Memory change 01/03/2016  . Left ankle pain 11/05/2015  . Advance care planning 12/29/2013  . Allergic rhinitis 12/23/2012  . Medicare annual wellness visit, subsequent 12/22/2011  . History of melanoma 09/22/2011  . Knee pain 08/13/2011  . Paresthesia of foot 08/13/2011  . ED (erectile dysfunction) 11/14/2010  . Schamberg's purpura 08/01/2010  . EXTERNAL HEMORRHOIDS 05/02/2010  . HYPERTROPHY PROSTATE W/UR OBST & OTH LUTS 10/09/2009  . CONSTIPATION, CHRONIC 01/03/2009  . ELEVATED BLOOD PRESSURE WITHOUT DIAGNOSIS OF HYPERTENSION 10/03/2008  . TRANSIENT ISCHEMIC ATTACK 08/24/2007  . COLONIC POLYPS, HX OF  08/24/2006  . DIVERTICULOSIS, COLON 09/21/2000  . HLD (hyperlipidemia) 05/05/1988    Past Surgical History:  Procedure Laterality Date  . Carotid U/S Nml  09/08/2007  . Colon polyp  2011   B9 Int Hemms  . Colonoscopy polyps  10/05/2003   Divertics in hemms  . COLONOSCOPY W/ POLYPECTOMY  08/1993; 09/21/2000;10/05/2003   B9  . Echo (other)  09/08/2007   nml lvf EF 50-55% Mild Diast Dysfctn Triv Ar  . melanoma scalp  07/2003  . SEPTOPLASTY  1995  . Skin revision vertex of scalp  2009   Dr. Luetta Nutting, [place] Medications    Prior to Admission medications   Medication Sig Start Date End Date Taking? Authorizing Provider  acetaminophen (TYLENOL) 650 MG CR tablet Take 1,300 mg by mouth every 8 (eight) hours as needed for pain.   Yes [provider]  Aloe Vera GEL Apply 1 application topically as needed (rectal pain).    Yes [provider]  aspirin (BAYER ASPIRIN EC LOW DOSE) 81 MG EC tablet Take 81 mg by mouth daily.     Yes [provider]  Bioflavonoid Products (ESTER C PO) Take 1,000 mg by mouth daily.    Yes [provider]  Coenzyme Q10 (COQ-10) 100 MG CAPS Take 200 mg by mouth daily.    Yes [provider]  ibuprofen (ADVIL,MOTRIN) 200 MG tablet Take 400 mg  by mouth 3 (three) times daily as needed for mild pain.   Yes [provider]  loratadine (CLARITIN) 10 MG tablet Take 10 mg by mouth daily.    Yes [provider]  Menthol, Topical Analgesic, (ICY HOT EX) Apply 1 application topically as needed (Shoulder pain).   Yes [provider]  Multiple Vitamins-Minerals (MULTIVITAL) tablet Take 1 tablet by mouth daily.     Yes [provider]  NASAL SPRAY SALINE NA Place 1 spray into both nostrils as needed (Congestion).   Yes [provider]  polyethylene glycol powder (MIRALAX) powder Take 17 g by mouth daily.     Yes [provider]  pravastatin (PRAVACHOL) 40 MG tablet  Take 1 tablet (40 mg total) by mouth daily. 01/03/16  Yes Tonia Ghent, MD  Propylene Glycol (SYSTANE BALANCE OP) Place 1 drop into both eyes 4 (four) times daily.   Yes [provider]    Family History Family History  Problem Relation Age of Onset  . Rectal cancer Father   . Colon cancer Paternal Aunt   . Other Paternal Aunt        cancer on back of neck  . Prostate cancer Neg Hx     Social History Social History  Substance Use Topics  . Smoking status: Former Smoker    Years: 5.00    Types: Cigarettes    Quit date: 05/05/1958  . Smokeless tobacco: Never Used  . Alcohol use No     Allergies   Flonase [fluticasone propionate] and Tamsulosin   Review of Systems Review of Systems  Constitutional: Negative for fever.  HENT: Positive for trouble swallowing. Negative for congestion.   Eyes: Negative for redness.  Respiratory: Negative for shortness of breath.   Cardiovascular: Negative for chest pain.  Gastrointestinal: Negative for abdominal pain.  Genitourinary: Negative for dysuria.  Musculoskeletal: Positive for arthralgias. Negative for back pain and neck pain.  Skin: Negative for rash.  Neurological: Negative for headaches.  Hematological: Does not bruise/bleed easily.  Psychiatric/Behavioral: Positive for confusion.     Physical Exam Updated Vital Signs BP (!) 148/67 (BP Location: Left Arm)   Pulse 69   Temp 97.5 F (36.4 C) (Oral)   Resp 16   Ht 1.702 m (5\' 7" )   Wt 66.2 kg (146 lb)   SpO2 98%   BMI 22.87 kg/m   Physical Exam  Constitutional: He is oriented to person, place, and time. He appears well-developed and well-nourished. No distress.  HENT:  Head: Normocephalic and atraumatic.  Mouth/Throat: Oropharynx is clear and moist. No oropharyngeal exudate.  Eyes: Conjunctivae and EOM are normal. Pupils are equal, round, and reactive to light.  Neck: Normal range of motion. Neck supple.  Cardiovascular: Normal rate and regular rhythm.     Pulmonary/Chest: Effort normal and breath sounds normal. No respiratory distress.  Abdominal: Soft. Bowel sounds are normal. There is no tenderness.  Musculoskeletal: Normal range of motion. He exhibits no edema or deformity.  Some back pain with range of motion of the right shoulder. No deformity. No tenderness to palpation. They'll pulse distally is 2+. Sensation intact.  Neurological: He is alert and oriented to person, place, and time. No cranial nerve deficit or sensory deficit. He exhibits normal muscle tone. Coordination normal.  Skin: Skin is warm. No rash noted.  Nursing note and vitals reviewed.    ED Treatments / Results  Labs (all labs ordered are listed, but only abnormal results are displayed) Labs Reviewed -  No data to display  EKG  EKG Interpretation None       Radiology Dg Shoulder Right  Result Date: 10/26/2016 CLINICAL DATA:  Right shoulder pain radiating distally over the upper arm. Symptoms 1 month. No injury. EXAM: RIGHT SHOULDER - 2+ VIEW COMPARISON:  None. FINDINGS: Mild degenerative change of the glenohumeral joint no evidence of acute fracture or dislocation. IMPRESSION: No acute findings. Mild degenerate change of the glenohumeral joint. Electronically Signed   By: Marin Olp M.D.   On: 10/26/2016 10:03    Procedures Procedures (including critical care time)  Medications Ordered in ED Medications - No data to display   Initial Impression / Assessment and Plan / ED Course  I have reviewed the triage vital signs and the nursing notes.  Pertinent labs & imaging results that were available during my care of the patient were reviewed by me and considered in my medical decision making (see chart for details).    Patient was some difficulty swallowing. When handling saliva liquids fine. Also even hot handling soft diet fine. No concerns for an impacted foreign body at this time.  Patient in follow-up with GI medicine for further evaluation.  Recommended soft diet small bites of food. Return for any new or worse symptoms.  Patient also with complaint of right shoulder pain. X-rays here show some mild arthritis. Patient with reasonable range of motion of the right shoulder but with some pain of with movement. Will have him follow back up with his primary care doctor. Physical therapy may be appropriate.   Final Clinical Impressions(s) / ED Diagnoses   Final diagnoses:  Dysphagia, unspecified type  Acute pain of right shoulder    New Prescriptions New Prescriptions   No medications on file     Fredia Sorrow, MD 10/26/16 1058

## 2016-10-26 NOTE — ED Triage Notes (Signed)
Pt. Stated, I was eating yesterday and got something stuck in my esophagus and this is twice this has happened. Its not stuck just concerned.

## 2016-10-27 ENCOUNTER — Telehealth: Payer: Self-pay

## 2016-10-27 NOTE — Telephone Encounter (Signed)
Patient was seen in the ED yesterday for dysphagia. Please refer to the attached team health triage call for details:  Patient Name: Jeffrey [name] Gender: Male DOB: [birth year] Age: 83 Y 70 M Return Phone Number: [phone] (Primary) City/State/Zip: [place] Client [place] Night - Client Client Site [place] Physician Renford Dills - MD Who Is Calling Patient / Member / Family / Caregiver Call Type Triage / Clinical Caller Name Javian Nudd Relationship To Patient Spouse Return Phone Number [phone] (Primary) Chief Complaint Swallowing Difficulty Reason for Call Symptomatic / Request for [place] states that her husband had a choking incident (hot dog) on Friday afternoon. He had some difficulty swallowing solid foods yesterday with some hoarseness and sore throat. He is also having trouble with a torn rotator cuff. Nurse Assessment Nurse: Gildardo Pounds, RN, Amy Date/Time Eilene Ghazi Time): 10/26/2016 6:35:32 AM Confirm and document reason for call. If symptomatic, describe symptoms. ---Caller states that her husband had a choking incident (hot dog) on Friday afternoon. It didn't block his breathing & he coughed it up. He had some difficulty swallowing solid foods yesterday with some hoarseness and sore throat. He has improved this morning but he still has a sore throat & hoarseness. He has had this happen 2 other times before. He is also having trouble with a torn rotator cuff. He has a tear in it. He has seen the doctor for it. He was given exercises for it. Does the PT have any chronic conditions? (i.e. diabetes, asthma, etc.) ---Yes List chronic conditions. ---TIA, melanoma on his head, high cholesterol Guidelines Guideline Title Affirmed Question Swallowing Difficulty [1] Swallowing difficulty AND [2] cause unknown (Exception: difficulty swallowing is a chronic symptom) Disp. Time Eilene Ghazi  Time) Disposition Final User 10/26/2016 6:51:50 AM See Physician within 24 Hours Yes [place], RN, Amy Referrals [facility] - ED Care Advice Given Per Guideline SEE PHYSICIAN WITHIN 24 HOURS: * IF OFFICE WILL BE CLOSED AND NO PCP TRIAGE: You need to be seen within the next 24 hours. An urgent care center is often a good source of care if your doctor's office is closed. CALL BACK IF: * You become worse. CARE ADVICE given per Swallowed Difficulty (Adult) guideline.

## 2016-10-28 NOTE — Telephone Encounter (Signed)
He should have f/u with GI pending.  We can set him up with ortho if needed for shoulder pain.  Please let me know if referral is needed.  Thanks.

## 2016-10-28 NOTE — Telephone Encounter (Signed)
Noted. Thanks.

## 2016-10-28 NOTE — Telephone Encounter (Signed)
Wife returned call.  Patient does have an appt with GI pending and would like to get this issue resolved prior to pursuing the shoulder pain.  ER did an x-ray and it did not show any bone abnormality and they mentioned possibly PT but again, patient wishes to get the GI issue done first.

## 2016-10-28 NOTE — Telephone Encounter (Signed)
Tried to phone patient, no answer, left message to return call.

## 2016-12-01 ENCOUNTER — Ambulatory Visit (INDEPENDENT_AMBULATORY_CARE_PROVIDER_SITE_OTHER): Payer: Medicare Other | Admitting: Gastroenterology

## 2016-12-01 ENCOUNTER — Encounter (INDEPENDENT_AMBULATORY_CARE_PROVIDER_SITE_OTHER): Payer: Self-pay

## 2016-12-01 ENCOUNTER — Encounter: Payer: Self-pay | Admitting: Gastroenterology

## 2016-12-01 VITALS — BP 122/70 | HR 76 | Ht 66.25 in | Wt 151.4 lb

## 2016-12-01 DIAGNOSIS — R1310 Dysphagia, unspecified: Secondary | ICD-10-CM | POA: Diagnosis not present

## 2016-12-01 DIAGNOSIS — K219 Gastro-esophageal reflux disease without esophagitis: Secondary | ICD-10-CM

## 2016-12-01 MED ORDER — OMEPRAZOLE 40 MG PO CPDR: 40.0000 mg | DELAYED_RELEASE_CAPSULE | Freq: Every day | ORAL | 11 refills | Status: DC

## 2016-12-01 NOTE — Progress Notes (Signed)
History of Present Illness: This is an 83 year old male referred by Tonia Ghent, MD for the evaluation of dysphagia. He is accompanied by his wife. He relates 2 episodes of significant dysphagia over the 2 months: 1 when eating a hot dog and 1 when eating a barbecue sandwich. They required self-induced vomiting to clear the symptoms. He was evaluated in the ED on June 24 for dysphagia. He has daily heartburn and regurgitation symptoms as well. Denies weight loss, abdominal pain, constipation, diarrhea, change in stool caliber, melena, hematochezia, nausea, chest pain.    Allergies  Allergen Reactions  . Flonase [Fluticasone Propionate] Other (See Comments)    Nosebleeds.    . Tamsulosin     REACTION: flushed and arms felt stiff   Outpatient Medications Prior to Visit  Medication Sig Dispense Refill  . acetaminophen (TYLENOL) 650 MG CR tablet Take 1,300 mg by mouth every 8 (eight) hours as needed for pain.    . Aloe Vera GEL Apply 1 application topically as needed (rectal pain).     Marland Kitchen aspirin (BAYER ASPIRIN EC LOW DOSE) 81 MG EC tablet Take 81 mg by mouth daily.      Marland Kitchen Bioflavonoid Products (ESTER C PO) Take 1,000 mg by mouth daily.     . Coenzyme Q10 (COQ-10) 100 MG CAPS Take 200 mg by mouth daily.     Marland Kitchen loratadine (CLARITIN) 10 MG tablet Take 10 mg by mouth daily.     . Menthol, Topical Analgesic, (ICY HOT EX) Apply 1 application topically as needed (Shoulder pain).    . Multiple Vitamins-Minerals (MULTIVITAL) tablet Take 1 tablet by mouth daily.      Marland Kitchen NASAL SPRAY SALINE NA Place 1 spray into both nostrils as needed (Congestion).    . polyethylene glycol powder (MIRALAX) powder Take 17 g by mouth daily.      . pravastatin (PRAVACHOL) 40 MG tablet Take 1 tablet (40 mg total) by mouth daily. 90 tablet 3  . Propylene Glycol (SYSTANE BALANCE OP) Place 1 drop into both eyes 4 (four) times daily.    Marland Kitchen ibuprofen (ADVIL,MOTRIN) 200 MG tablet Take 400 mg by mouth 3 (three) times daily as  needed for mild pain.     No facility-administered medications prior to visit.    Past Medical History:  Diagnosis Date  . Adenomatous polyp of colon 1995    w/ HGD  . Allergic rhinitis   . BPH (benign prostatic hyperplasia)   . Diverticulosis 09/21/2000  . ED (erectile dysfunction)   . Hyperlipemia 05/05/1988  . Schamberg's purpura    2012- eval by Dr. Allyson Sabal  . Sebaceous cyst   . Skin cancer 05/05/1998   hx of Scalp-melanoma  . TIA (transient ischemic attack)    Past Surgical History:  Procedure Laterality Date  . Carotid U/S Nml  09/08/2007  . Colon polyp  2011   B9 Int Hemms  . Colonoscopy polyps  10/05/2003   Divertics in hemms  . COLONOSCOPY W/ POLYPECTOMY  08/1993; 09/21/2000;10/05/2003   B9  . Echo (other)  09/08/2007   nml lvf EF 50-55% Mild Diast Dysfctn Triv Ar  . melanoma scalp  07/2003  . SEPTOPLASTY  1995  . Skin revision vertex of scalp  2009   Dr. Luetta Nutting, [place] History   Social History  . Marital status: Married    Spouse name: N/A  . Number of children: 1  . Years of education: N/A  Occupational History  . 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 Smoking status: Former Smoker    Years: 5.00    Types: Cigarettes    Quit date: 05/05/1958  . Smokeless tobacco: Never Used  . Alcohol use No  . Drug use: No  . Sexual activity: Not Asked   Other Topics Concern  . 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 Rectal cancer Father   . Colon cancer Paternal Aunt   . Other Paternal Aunt        cancer on back of neck  . Prostate cancer Neg Hx       Review of Systems: Pertinent positive and negative review of systems were noted in the above HPI section. All other review of systems were  otherwise negative.   Physical Exam: General: Well developed, well nourished, no acute distress Head: Normocephalic and atraumatic Eyes:  sclerae anicteric, EOMI Ears: Normal auditory acuity Mouth: No deformity or lesions Neck: Supple, no masses or thyromegaly Lungs: Clear throughout to auscultation Heart: Regular rate and rhythm; no murmurs, rubs or bruits Abdomen: Soft, non tender and non distended. No masses, hepatosplenomegaly or hernias noted. Normal Bowel sounds Musculoskeletal: Symmetrical with no gross deformities  Skin: No lesions on visible extremities Pulses:  Normal pulses noted Extremities: No clubbing, cyanosis, edema or deformities noted Neurological: Alert oriented x 4, grossly nonfocal Cervical Nodes:  No significant cervical adenopathy Inguinal Nodes: No significant inguinal adenopathy Psychological:  Alert and cooperative. Normal mood and affect  Assessment and Recommendations:  1. Dysphagia and GERD. Rule out esophagitis, stricture, neoplasm. Closely follow all antireflux measures. Begin omeprazole 40 mg by mouth every morning. Schedule barium esophagram. Schedule EGD. The risks (including bleeding, perforation, infection, missed lesions, medication reactions and possible hospitalization or surgery if complications occur), benefits, and alternatives to endoscopy with possible biopsy and possible dilation were discussed with the patient and they consent to proceed.     cc: Tonia Ghent, MD [address] [place], [place] [address]

## 2016-12-01 NOTE — Patient Instructions (Signed)
We have sent the following medications to your pharmacy for you to pick up at your convenience: omeprazole.   Patient advised to avoid spicy, acidic, citrus, chocolate, mints, fruit and fruit juices.  Limit the intake of caffeine, alcohol and Soda.  Don't exercise too soon after eating.  Don't lie down within 3-4 hours of eating.  Elevate the head of your bed.  You have been scheduled for a Barium Esophogram at [facility] (1st floor of the hospital) on 12/17/16 at 9:30am. Please arrive 15 minutes prior to your appointment for registration. Make certain not to have anything to eat or drink 3 hours prior to your test. If you need to reschedule for any reason, please contact radiology at [phone] to do so. __________________________________________________________________ A barium swallow is an examination that concentrates on views of the esophagus. This tends to be a double contrast exam (barium and two liquids which, when combined, create a gas to distend the wall of the oesophagus) or single contrast (non-ionic iodine based). The study is usually tailored to your symptoms so a good history is essential. Attention is paid during the study to the form, structure and configuration of the esophagus, looking for functional disorders (such as aspiration, dysphagia, achalasia, motility and reflux) EXAMINATION You may be asked to change into a gown, depending on the type of swallow being performed. A radiologist and radiographer will perform the procedure. The radiologist will advise you of the type of contrast selected for your procedure and direct you during the exam. You will be asked to stand, sit or lie in several different positions and to hold a small amount of fluid in your mouth before being asked to swallow while the imaging is performed .In some instances you may be asked to swallow barium coated marshmallows to assess the motility of a solid food bolus. The exam can be recorded as a  digital or video fluoroscopy procedure. POST PROCEDURE It will take 1-2 days for the barium to pass through your system. To facilitate this, it is important, unless otherwise directed, to increase your fluids for the next 24-48hrs and to resume your normal diet.  This test typically takes about 30 minutes to perform. _________________________________________________________________________  Jeffrey [name] have been scheduled for an endoscopy. Please follow written instructions given to you at your visit today. If you use inhalers (even only as needed), please bring them with you on the day of your procedure. Your physician has requested that you go to www.startemmi.com and enter the access code given to you at your visit today. This web site gives a general overview about your procedure. However, you should still follow specific instructions given to you by our office regarding your preparation for the procedure.  Thank you for choosing me and [facility].  Pricilla Riffle. Dagoberto Ligas., MD., [facility]

## 2016-12-04 ENCOUNTER — Telehealth: Payer: Self-pay | Admitting: Gastroenterology

## 2016-12-04 NOTE — Telephone Encounter (Signed)
Patients wife notified

## 2016-12-04 NOTE — Telephone Encounter (Signed)
Patient's wife reports that patient has a cold neck and face, very dry mouth, and was not able to swallow due to the dry mouth.  He has been taking Claritin for allergies.  His wife is concerned about his cold neck and face and feels it could be related to omeprazole.  Wife notified unlikely to be related to omeprazole, more likely the dry mouth is related to Claritin.  She is advised he can hold his omeprazole for a few days and see his primary care about concerns of cold neck and face.

## 2016-12-04 NOTE — Telephone Encounter (Signed)
DC Claritin for a few days too. Pt can contact his PCP for alternative to Claritin.

## 2016-12-11 ENCOUNTER — Telehealth: Payer: Self-pay | Admitting: Gastroenterology

## 2016-12-11 NOTE — Telephone Encounter (Signed)
The pt's wife notified that the pt can have his regular diet until 630 am.  She verbalized understanding.

## 2016-12-17 ENCOUNTER — Ambulatory Visit (HOSPITAL_COMMUNITY)
Admission: RE | Admit: 2016-12-17 | Discharge: 2016-12-17 | Disposition: A | Discharge status: Home / Self Care | Payer: Medicare Other | Source: Ambulatory Visit | Attending: Gastroenterology | Admitting: Gastroenterology

## 2016-12-17 DIAGNOSIS — K222 Esophageal obstruction: Secondary | ICD-10-CM | POA: Insufficient documentation

## 2016-12-17 DIAGNOSIS — R1310 Dysphagia, unspecified: Secondary | ICD-10-CM | POA: Diagnosis not present

## 2016-12-17 DIAGNOSIS — K219 Gastro-esophageal reflux disease without esophagitis: Secondary | ICD-10-CM | POA: Insufficient documentation

## 2016-12-22 ENCOUNTER — Telehealth: Payer: Self-pay | Admitting: Gastroenterology

## 2016-12-22 DIAGNOSIS — K219 Gastro-esophageal reflux disease without esophagitis: Secondary | ICD-10-CM

## 2016-12-22 MED ORDER — PANTOPRAZOLE SODIUM 40 MG PO TBEC: 40.0000 mg | DELAYED_RELEASE_TABLET | Freq: Every day | ORAL | 3 refills | Status: DC

## 2016-12-22 NOTE — Telephone Encounter (Signed)
Called patient and l/m for him to return my call.

## 2016-12-22 NOTE — Telephone Encounter (Signed)
See PCP for ankle swelling this is not omeprazole side effect.  Pantoprazole 40 mg po qam. DC omeprazole.  Miralax qd-bid if constipated.

## 2016-12-22 NOTE — Telephone Encounter (Signed)
Patient's wife called and I gave her the instructions that Dr. Fuller Plan gave me.  I sent the prescription to [facility] on [address] for Pantoprazole 40 mg.  Advised her to pick up Rx. Tomorrow after the procedure.   Elizabeth Palau, CMA

## 2016-12-22 NOTE — Telephone Encounter (Signed)
Returned patient's call and spoke to his wife.  She is concerned about his ankles swelling and stomach bloating and tight.  She states this started after starting the Omeprazole.  He took his gas meds Simethicone and he is still having the symptoms.  The wife states that he is going to hold his Omeprazole today and tomorrow until they can speak to Dr. Fuller Plan.  I advised her that I will send this information to Dr. Fuller Plan and call them if there is anything recommended to do differently.  All questions were answered.  Advised wife to call if symptoms worsen.  Elizabeth Palau, CMA-admitting.

## 2016-12-23 ENCOUNTER — Ambulatory Visit (AMBULATORY_SURGERY_CENTER): Payer: Medicare Other | Admitting: Gastroenterology

## 2016-12-23 ENCOUNTER — Encounter: Payer: Self-pay | Admitting: Gastroenterology

## 2016-12-23 VITALS — BP 117/62 | HR 58 | Temp 97.3°F | Resp 23 | Ht 66.25 in | Wt 151.0 lb

## 2016-12-23 DIAGNOSIS — K219 Gastro-esophageal reflux disease without esophagitis: Secondary | ICD-10-CM

## 2016-12-23 DIAGNOSIS — Z8673 Personal history of transient ischemic attack (TIA), and cerebral infarction without residual deficits: Secondary | ICD-10-CM | POA: Diagnosis not present

## 2016-12-23 DIAGNOSIS — K222 Esophageal obstruction: Secondary | ICD-10-CM | POA: Diagnosis not present

## 2016-12-23 DIAGNOSIS — R1310 Dysphagia, unspecified: Secondary | ICD-10-CM | POA: Diagnosis not present

## 2016-12-23 MED ORDER — SODIUM CHLORIDE 0.9 % IV SOLN: 500.0000 mL | INTRAVENOUS | Status: DC

## 2016-12-23 NOTE — Op Note (Addendum)
[facility] Patient Name: Jeffrey [name] Procedure Date: 12/23/2016 7:23 AM MRN: [id number] Endoscopist: Ladene Artist , MD Age: 83 Referring MD:  Date of Birth: 1928/10/20 Gender: Male Account #: [id number] Procedure:                Upper GI endoscopy Indications:              Dysphagia, Gastro-esophageal reflux disease,                            Abnormal barium esophagram Medicines:                Monitored Anesthesia Care Procedure:                Pre-Anesthesia Assessment:                           - Prior to the procedure, a History and Physical                            was performed, and patient medications and                            allergies were reviewed. The patient's tolerance of                            previous anesthesia was also reviewed. The risks                            and benefits of the procedure and the sedation                            options and risks were discussed with the patient.                            All questions were answered, and informed consent                            was obtained. Prior Anticoagulants: The patient has                            taken no previous anticoagulant or antiplatelet                            agents. ASA Grade Assessment: II - A patient with                            mild systemic disease. After reviewing the risks                            and benefits, the patient was deemed in                            satisfactory condition to undergo the procedure.  After obtaining informed consent, the endoscope was                            passed under direct vision. Throughout the                            procedure, the patient's blood pressure, pulse, and                            oxygen saturations were monitored continuously. The                            Endoscope was introduced through the mouth, and                            advanced to the second part of  duodenum. The upper                            GI endoscopy was accomplished without difficulty.                            The patient tolerated the procedure well. Scope In: Scope Out: Findings:                 One moderate benign-appearing, intrinsic stenosis                            was found at the gastroesophageal junction. This                            measured 1.3 cm (inner diameter) and was traversed.                            A guidewire was placed and the scope was withdrawn.                            Dilations were performed with Savary dilators with                            mild resistance at 13 mm, 14 mm and 15 mm. No heme                            noted.                           The exam of the esophagus was otherwise normal.                           A small hiatal hernia was present.                           The duodenal bulb and second portion of the  duodenum were normal.                           The exam of the stomach was otherwise normal. Complications:            No immediate complications. Estimated Blood Loss:     Estimated blood loss: none. Impression:               - Benign-appearing esophageal stenosis. Dilated.                           - Small hiatal hernia.                           - Normal duodenal bulb and second portion of the                            duodenum.                           - No specimens collected. Recommendation:           - Patient has a contact number available for                            emergencies. The signs and symptoms of potential                            delayed complications were discussed with the                            patient. Return to normal activities tomorrow.                            Written discharge instructions were provided to the                            patient.                           - Clear liquid diet for 2 hours, then advance as                             tolerated to soft diet today.                           - Antirelfux measures.                           - Continue present medications.                           - Return to GI office in 6 weeks. Ladene Artist, MD 12/23/2016 7:49:07 AM This report has been signed electronically.

## 2016-12-23 NOTE — Progress Notes (Signed)
Called to room to assist during endoscopic procedure.  Patient ID and intended procedure confirmed with present staff. Received instructions for my participation in the procedure from the performing physician.  

## 2016-12-23 NOTE — Progress Notes (Signed)
No problems noted in the recovery room. maw 

## 2016-12-23 NOTE — Patient Instructions (Addendum)
YOU HAD AN ENDOSCOPIC PROCEDURE TODAY AT [facility]:   Refer to the procedure report that was given to you for any specific questions about what was found during the examination.  If the procedure report does not answer your questions, please call your gastroenterologist to clarify.  If you requested that your care partner not be given the details of your procedure findings, then the procedure report has been included in a sealed envelope for you to review at your convenience later.  YOU SHOULD EXPECT: Some feelings of bloating in the abdomen. Passage of more gas than usual.  Walking can help get rid of the air that was put into your GI tract during the procedure and reduce the bloating. If you had a lower endoscopy (such as a colonoscopy or flexible sigmoidoscopy) you may notice spotting of blood in your stool or on the toilet paper. If you underwent a bowel prep for your procedure, you may not have a normal bowel movement for a few days.  Please Note:  You might notice some irritation and congestion in your nose or some drainage.  This is from the oxygen used during your procedure.  There is no need for concern and it should clear up in a day or so.  SYMPTOMS TO REPORT IMMEDIATELY:   Following upper endoscopy (EGD)  Vomiting of blood or coffee ground material  New chest pain or pain under the shoulder blades  Painful or persistently difficult swallowing  New shortness of breath  Fever of 100F or higher  Black, tarry-looking stools  For urgent or emergent issues, a gastroenterologist can be reached at any hour by calling [phone].   DIET:  Please follow the dilatation diet the rest of today.  Handout was given to your wife.  Drink plenty of fluids but you should avoid alcoholic beverages for 24 hours.  ACTIVITY:  You should plan to take it easy for the rest of today and you should NOT DRIVE or use heavy machinery until tomorrow (because of the sedation medicines used  during the test).    FOLLOW UP: Our staff will call the number listed on your records the next business day following your procedure to check on you and address any questions or concerns that you may have regarding the information given to you following your procedure. If we do not reach you, we will leave a message.  However, if you are feeling well and you are not experiencing any problems, there is no need to return our call.  We will assume that you have returned to your regular daily activities without incident.  If any biopsies were taken you will be contacted by phone or by letter within the next 1-3 weeks.  Please call us at [phone] if you have not heard about the biopsies in 3 weeks.    SIGNATURES/CONFIDENTIALITY: You and/or your care partner have signed paperwork which will be entered into your electronic medical record.  These signatures attest to the fact that that the information above on your After Visit Summary has been reviewed and is understood.  Full responsibility of the confidentiality of this discharge information lies with you and/or your care-partner.   Handouts were given to your care partner on the dilatation diet to follow the rest of today, GERD and a hiatal hernia. You may resume your current medications today. Return to the office in 6 weeks.  The office will call you with this appointment. Please call if any questions or  concerns.

## 2016-12-23 NOTE — Progress Notes (Signed)
Report to PACU, RN, vss, BBS= Clear.  

## 2016-12-24 ENCOUNTER — Telehealth: Payer: Self-pay

## 2016-12-24 NOTE — Telephone Encounter (Signed)
Unable to reach line busy times 2.

## 2016-12-24 NOTE — Telephone Encounter (Signed)
Unable to leave message- line busy.

## 2016-12-29 ENCOUNTER — Other Ambulatory Visit: Payer: Self-pay | Admitting: Family Medicine

## 2016-12-29 ENCOUNTER — Other Ambulatory Visit (INDEPENDENT_AMBULATORY_CARE_PROVIDER_SITE_OTHER): Payer: Medicare Other

## 2016-12-29 DIAGNOSIS — E785 Hyperlipidemia, unspecified: Secondary | ICD-10-CM | POA: Diagnosis not present

## 2016-12-29 LAB — COMPREHENSIVE METABOLIC PANEL
ALT: 28 U/L (ref 0–53)
AST: 24 U/L (ref 0–37)
Albumin: 4.3 g/dL (ref 3.5–5.2)
Alkaline Phosphatase: 41 U/L (ref 39–117)
BUN: 13 mg/dL (ref 6–23)
CO2: 29 mEq/L (ref 19–32)
Calcium: 9.5 mg/dL (ref 8.4–10.5)
Chloride: 104 mEq/L (ref 96–112)
Creatinine, Ser: 0.99 mg/dL (ref 0.40–1.50)
GFR: 76.19 mL/min (ref >60.00)
Glucose, Bld: 93 mg/dL (ref 70–99)
Potassium: 4.4 mEq/L (ref 3.5–5.1)
Sodium: 137 mEq/L (ref 135–145)
Total Bilirubin: 0.6 mg/dL (ref 0.2–1.2)
Total Protein: 6.7 g/dL (ref 6.0–8.3)

## 2016-12-29 LAB — LIPID PANEL
Cholesterol: 122 mg/dL (ref 0–200)
HDL: 42.8 mg/dL (ref >39.00)
LDL Cholesterol: 60 mg/dL (ref 0–99)
NonHDL: 78.95
Total CHOL/HDL Ratio: 3
Triglycerides: 95 mg/dL (ref 0.0–149.0)
VLDL: 19 mg/dL (ref 0.0–40.0)

## 2017-01-08 ENCOUNTER — Encounter: Payer: Self-pay | Admitting: Family Medicine

## 2017-01-08 ENCOUNTER — Ambulatory Visit (INDEPENDENT_AMBULATORY_CARE_PROVIDER_SITE_OTHER): Payer: Medicare Other | Admitting: Family Medicine

## 2017-01-08 VITALS — BP 112/52 | HR 68 | Temp 97.6°F | Ht 66.0 in | Wt 150.0 lb

## 2017-01-08 DIAGNOSIS — K222 Esophageal obstruction: Secondary | ICD-10-CM | POA: Diagnosis not present

## 2017-01-08 DIAGNOSIS — Z23 Encounter for immunization: Secondary | ICD-10-CM | POA: Diagnosis not present

## 2017-01-08 DIAGNOSIS — Z7189 Other specified counseling: Secondary | ICD-10-CM

## 2017-01-08 DIAGNOSIS — E785 Hyperlipidemia, unspecified: Secondary | ICD-10-CM

## 2017-01-08 DIAGNOSIS — Z0000 Encounter for general adult medical examination without abnormal findings: Secondary | ICD-10-CM | POA: Diagnosis not present

## 2017-01-08 MED ORDER — PRAVASTATIN SODIUM 40 MG PO TABS: 40.0000 mg | ORAL_TABLET | Freq: Every day | ORAL | 3 refills | Status: DC

## 2017-01-08 NOTE — Patient Instructions (Signed)
Don't change your meds for now and update [place] as needed.  Take care.  Glad to see you.

## 2017-01-08 NOTE — Progress Notes (Signed)
I have personally reviewed the Medicare Annual Wellness questionnaire and have noted 1. The patient's medical and social history 2. Their use of alcohol, tobacco or illicit drugs 3. Their current medications and supplements 4. The patient's functional ability including ADL's, fall risks, home safety risks and hearing or visual             impairment. 5. Diet and physical activities 6. Evidence for depression or mood disorders  The patients weight, height, BMI have been recorded in the chart and visual acuity is per eye clinic.  I have made referrals, counseling and provided education to the patient based review of the above and I have provided the pt with a written personalized care plan for preventive services.  Provider list updated- see scanned forms.  Routine anticipatory guidance given to patient.  See health maintenance. The possibility exists that previously documented standard health maintenance information may have been brought forward from a previous encounter into this note.  If needed, that same information has been updated to reflect the current situation based on today's encounter.    Flu 2018 Shingles 2012 PNA UTD Tetanus 2006 Colonoscopy NA due to age, d/w pt.  He agrees.  Prostate cancer screening NA due to age, d/w pt.  He agrees.  Advance directive -wife designated if patient were incapacitated. We talked about his MOST form in detail prev. Signed at the office visit prev. He was still full scope of treatment, assuming he had a treatable condition. He would not prolonged ongoing futile care. Cognitive function addressed- see scanned forms- and if abnormal then additional documentation follows.   His R shoulder pain is gradually getting better.    Elevated Cholesterol: Using medications without problems:yes Muscle aches: no Diet compliance: yes Exercise:yes Labs d/w pt.    Swallowing better after EGD noted.  Still on PPI.   Lower BP noted. Not lightheaded.  Not on  BP lower meds.  Drinking adequate fluids.  Weight is stable.    PMH and SH reviewed  Meds, vitals, and allergies reviewed.   ROS: Per HPI.  Unless specifically indicated otherwise in HPI, the patient denies:  General: fever. Eyes: acute vision changes ENT: sore throat Cardiovascular: chest pain Respiratory: SOB GI: vomiting GU: dysuria Musculoskeletal: acute back pain Derm: acute rash Neuro: acute motor dysfunction Psych: worsening mood Endocrine: polydipsia Heme: bleeding Allergy: hayfever  GEN: nad, alert and oriented HEENT: mucous membranes moist NECK: supple w/o LA CV: rrr. PULM: ctab, no inc wob ABD: soft, +bs EXT: no edema SKIN: no acute rash

## 2017-01-09 DIAGNOSIS — K222 Esophageal obstruction: Secondary | ICD-10-CM | POA: Insufficient documentation

## 2017-01-09 NOTE — Assessment & Plan Note (Signed)
Flu 2018 Shingles 2012 PNA UTD Tetanus 2006 Colonoscopy NA due to age, d/w pt.  He agrees.  Prostate cancer screening NA due to age, d/w pt.  He agrees.  Advance directive -wife designated if patient were incapacitated. We talked about his MOST form in detail prev. Signed at the office visit prev. He was still full scope of treatment, assuming he had a treatable condition. He would not prolonged ongoing futile care. Cognitive function addressed- see scanned forms- and if abnormal then additional documentation follows.

## 2017-01-09 NOTE — Assessment & Plan Note (Signed)
Status post dilation. Swallowing well. Still on PPI. Continue as is. He agrees.

## 2017-01-09 NOTE — Assessment & Plan Note (Signed)
Able to tolerate statin. Labs discussed with patient. Continue as is. He agrees.

## 2017-01-09 NOTE — Assessment & Plan Note (Signed)
Advance directive -wife designated if patient were incapacitated. We talked about his MOST form in detail prev. Signed at the office visit prev. He was still full scope of treatment, assuming he had a treatable condition. He would not prolonged ongoing futile care.

## 2017-01-12 ENCOUNTER — Encounter: Payer: Medicare Other | Admitting: Family Medicine

## 2017-04-05 ENCOUNTER — Other Ambulatory Visit: Payer: Self-pay | Admitting: Gastroenterology

## 2017-04-05 DIAGNOSIS — K219 Gastro-esophageal reflux disease without esophagitis: Secondary | ICD-10-CM

## 2017-06-19 DIAGNOSIS — H10503 Unspecified blepharoconjunctivitis, bilateral: Secondary | ICD-10-CM | POA: Diagnosis not present

## 2017-07-27 ENCOUNTER — Other Ambulatory Visit: Payer: Self-pay | Admitting: Gastroenterology

## 2017-07-27 DIAGNOSIS — K219 Gastro-esophageal reflux disease without esophagitis: Secondary | ICD-10-CM

## 2017-09-08 DIAGNOSIS — Z85820 Personal history of malignant melanoma of skin: Secondary | ICD-10-CM | POA: Diagnosis not present

## 2017-09-08 DIAGNOSIS — L570 Actinic keratosis: Secondary | ICD-10-CM | POA: Diagnosis not present

## 2017-09-08 DIAGNOSIS — D1801 Hemangioma of skin and subcutaneous tissue: Secondary | ICD-10-CM | POA: Diagnosis not present

## 2017-09-08 DIAGNOSIS — L821 Other seborrheic keratosis: Secondary | ICD-10-CM | POA: Diagnosis not present

## 2017-09-08 DIAGNOSIS — D229 Melanocytic nevi, unspecified: Secondary | ICD-10-CM | POA: Diagnosis not present

## 2017-09-08 DIAGNOSIS — L814 Other melanin hyperpigmentation: Secondary | ICD-10-CM | POA: Diagnosis not present

## 2017-11-24 ENCOUNTER — Other Ambulatory Visit: Payer: Self-pay | Admitting: Gastroenterology

## 2017-11-24 DIAGNOSIS — K219 Gastro-esophageal reflux disease without esophagitis: Secondary | ICD-10-CM

## 2017-12-29 ENCOUNTER — Other Ambulatory Visit: Payer: Self-pay | Admitting: Gastroenterology

## 2017-12-29 DIAGNOSIS — K219 Gastro-esophageal reflux disease without esophagitis: Secondary | ICD-10-CM

## 2018-01-15 DIAGNOSIS — H25813 Combined forms of age-related cataract, bilateral: Secondary | ICD-10-CM | POA: Diagnosis not present

## 2018-01-15 DIAGNOSIS — H1032 Unspecified acute conjunctivitis, left eye: Secondary | ICD-10-CM | POA: Diagnosis not present

## 2018-01-15 DIAGNOSIS — H10503 Unspecified blepharoconjunctivitis, bilateral: Secondary | ICD-10-CM | POA: Diagnosis not present

## 2018-01-15 DIAGNOSIS — H21531 Iridodialysis, right eye: Secondary | ICD-10-CM | POA: Diagnosis not present

## 2018-01-20 ENCOUNTER — Ambulatory Visit (INDEPENDENT_AMBULATORY_CARE_PROVIDER_SITE_OTHER): Payer: Medicare Other

## 2018-01-20 VITALS — BP 120/70 | HR 69 | Temp 97.5°F | Ht 65.5 in | Wt 145.5 lb

## 2018-01-20 DIAGNOSIS — Z0000 Encounter for general adult medical examination without abnormal findings: Secondary | ICD-10-CM | POA: Diagnosis not present

## 2018-01-20 DIAGNOSIS — Z23 Encounter for immunization: Secondary | ICD-10-CM

## 2018-01-20 DIAGNOSIS — E785 Hyperlipidemia, unspecified: Secondary | ICD-10-CM

## 2018-01-20 LAB — COMPREHENSIVE METABOLIC PANEL
ALT: 22 U/L (ref 0–53)
AST: 23 U/L (ref 0–37)
Albumin: 4.2 g/dL (ref 3.5–5.2)
Alkaline Phosphatase: 57 U/L (ref 39–117)
BUN: 13 mg/dL (ref 6–23)
CO2: 30 mEq/L (ref 19–32)
Calcium: 9.4 mg/dL (ref 8.4–10.5)
Chloride: 103 mEq/L (ref 96–112)
Creatinine, Ser: 0.96 mg/dL (ref 0.40–1.50)
GFR: 78.75 mL/min (ref >60.00)
Glucose, Bld: 87 mg/dL (ref 70–99)
Potassium: 4.7 mEq/L (ref 3.5–5.1)
Sodium: 137 mEq/L (ref 135–145)
Total Bilirubin: 0.5 mg/dL (ref 0.2–1.2)
Total Protein: 6.7 g/dL (ref 6.0–8.3)

## 2018-01-20 LAB — LDL CHOLESTEROL, DIRECT: Direct LDL: 61 mg/dL

## 2018-01-20 LAB — LIPID PANEL
Cholesterol: 121 mg/dL (ref 0–200)
HDL: 39.5 mg/dL (ref >39.00)
NonHDL: 81.56
Total CHOL/HDL Ratio: 3
Triglycerides: 202 mg/dL — ABNORMAL HIGH (ref 0.0–149.0)
VLDL: 40.4 mg/dL — ABNORMAL HIGH (ref 0.0–40.0)

## 2018-01-20 NOTE — Patient Instructions (Signed)
Jeffrey [name] , Thank you for taking time to come for your Medicare Wellness Visit. I appreciate your ongoing commitment to your health goals. Please review the following plan we discussed and let me know if I can assist you in the future.   These are the goals we discussed: Goals    . DIET - INCREASE WATER INTAKE     Starting 01/20/2018, I will continue to drink at least 6-8 glasses of water daily.        This is a list of the screening recommended for you and due dates:  Health Maintenance  Topic Date Due  . Tetanus Vaccine  05/05/2019*  . Flu Shot  Completed  . Pneumonia vaccines  Completed  *Topic was postponed. The date shown is not the original due date.   Preventive Care for Adults  A healthy lifestyle and preventive care can promote health and wellness. Preventive health guidelines for adults include the following key practices.  . A routine yearly physical is a good way to check with your health care provider about your health and preventive screening. It is a chance to share any concerns and updates on your health and to receive a thorough exam.  . Visit your dentist for a routine exam and preventive care every 6 months. Brush your teeth twice a day and floss once a day. Good oral hygiene prevents tooth decay and gum disease.  . The frequency of eye exams is based on your age, health, family medical history, use  of contact lenses, and other factors. Follow your health care provider's recommendations for frequency of eye exams.  . Eat a healthy diet. Foods like vegetables, fruits, whole grains, low-fat dairy products, and lean protein foods contain the nutrients you need without too many calories. Decrease your intake of foods high in solid fats, added sugars, and salt. Eat the right amount of calories for you. Get information about a proper diet from your health care provider, if necessary.  . Regular physical exercise is one of the most important things you can do for your  health. Most adults should get at least 150 minutes of moderate-intensity exercise (any activity that increases your heart rate and causes you to sweat) each week. In addition, most adults need muscle-strengthening exercises on 2 or more days a week.  Silver Sneakers may be a benefit available to you. To determine eligibility, you may visit the website: www.silversneakers.com or contact program at 670-697-6806 Mon-Fri between 8AM-8PM.   . Maintain a healthy weight. The body mass index (BMI) is a screening tool to identify possible weight problems. It provides an estimate of body fat based on height and weight. Your health care provider can find your BMI and can help you achieve or maintain a healthy weight.   For adults 20 years and older: ? A BMI below 18.5 is considered underweight. ? A BMI of 18.5 to 24.9 is normal. ? A BMI of 25 to 29.9 is considered overweight. ? A BMI of 30 and above is considered obese.   . Maintain normal blood lipids and cholesterol levels by exercising and minimizing your intake of saturated fat. Eat a balanced diet with plenty of fruit and vegetables. Blood tests for lipids and cholesterol should begin at age 40 and be repeated every 5 years. If your lipid or cholesterol levels are high, you are over 50, or you are at high risk for heart disease, you may need your cholesterol levels checked more frequently. Ongoing high lipid and  cholesterol levels should be treated with medicines if diet and exercise are not working.  . If you smoke, find out from your health care provider how to quit. If you do not use tobacco, please do not start.  . If you choose to drink alcohol, please do not consume more than 2 drinks per day. One drink is considered to be 12 ounces (355 mL) of beer, 5 ounces (148 mL) of wine, or 1.5 ounces (44 mL) of liquor.  . If you are 58-85 years old, ask your health care provider if you should take aspirin to prevent strokes.  . Use sunscreen. Apply  sunscreen liberally and repeatedly throughout the day. You should seek shade when your shadow is shorter than you. Protect yourself by wearing long sleeves, pants, a wide-brimmed hat, and sunglasses year round, whenever you are outdoors.  . Once a month, do a whole body skin exam, using a mirror to look at the skin on your back. Tell your health care provider of new moles, moles that have irregular borders, moles that are larger than a pencil eraser, or moles that have changed in shape or color.

## 2018-01-24 NOTE — Progress Notes (Signed)
Subjective:   Jeffrey [name] is a 84 y.o. male who presents for Medicare Annual/Subsequent preventive examination.  Review of Systems:  N/A Cardiac Risk Factors include: advanced age (>15men, >54 women);male gender     Objective:    Vitals: BP 120/70 (BP Location: Right Arm, Patient Position: Sitting, Cuff Size: Normal)   Pulse 69   Temp (!) 97.5 F (36.4 C) (Oral)   Ht 5' 5.5" (1.664 m) Comment: no shoes  Wt 145 lb 8 oz (66 kg)   SpO2 98%   BMI 23.84 kg/m   Body mass index is 23.84 kg/m.  Advanced Directives 01/20/2018 10/26/2016  Does Patient Have a Medical Advance Directive? Yes Yes  Type of Paramedic of [place];Living will -  Copy of [place] in Chart? No - copy requested -    Tobacco Social History   Tobacco Use  Smoking Status Former Smoker  . Years: 5.00  . Types: Cigarettes  . Last attempt to quit: 05/05/1958  . Years since quitting: 59.7  Smokeless Tobacco Never Used     Counseling given: No   Clinical Intake:  Pre-visit preparation completed: Yes  Pain : No/denies pain Pain Score: 0-No pain     Nutritional Status: BMI of 19-24  Normal Nutritional Risks: None Diabetes: No  How often do you need to have someone help you when you read instructions, pamphlets, or other written materials from your doctor or pharmacy?: 1 - Never What is the last grade level you completed in school?: Bachelor degree  Interpreter Needed?: No  Comments: pt lives with spouse Information entered by :: LPinson, LPN  Past Medical History:  Diagnosis Date  . Adenomatous polyp of colon 1995    w/ HGD  . Allergic rhinitis   . BPH (benign prostatic hyperplasia)   . Diverticulosis 09/21/2000  . ED (erectile dysfunction)   . Hyperlipemia 05/05/1988  . Schamberg's purpura    2012- eval by Dr. Allyson Sabal  . Sebaceous cyst   . Skin cancer 05/05/1998   hx of Scalp-melanoma  . TIA (transient ischemic attack)    Past Surgical  History:  Procedure Laterality Date  . Carotid U/S Nml  09/08/2007  . Colon polyp  2011   B9 Int Hemms  . Colonoscopy polyps  10/05/2003   Divertics in hemms  . COLONOSCOPY W/ POLYPECTOMY  08/1993; 09/21/2000;10/05/2003   B9  . Echo (other)  09/08/2007   nml lvf EF 50-55% Mild Diast Dysfctn Triv Ar  . melanoma scalp  07/2003  . SEPTOPLASTY  1995  . Skin revision vertex of scalp  2009   Dr. Luetta Nutting, [facility]  . TONSILLECTOMY     Family History  Problem Relation Age of Onset  . Rectal cancer Father   . Colon cancer Paternal Aunt   . Other Paternal Aunt        cancer on back of neck  . Prostate cancer Neg Hx    Social History   Socioeconomic History  . Marital status: Married    Spouse name: Not on file  . Number of children: 1  . Years of education: Not on file  . Highest education level: Not on file  Occupational History  . Occupation: retired-  lab [employer]    Employer: RETIRED  Social Needs  . Financial resource strain: Not on file  . Food insecurity:    Worry: Not on file    Inability: Not on file  . Transportation needs:  Medical: Not on file    Non-medical: Not on file  Tobacco Use  . Smoking status: Former Smoker    Years: 5.00    Types: Cigarettes    Last attempt to quit: 05/05/1958    Years since quitting: 59.7  . Smokeless tobacco: Never Used  Substance and Sexual Activity  . Alcohol use: No  . Drug use: No  . Sexual activity: Not on file  Lifestyle  . Physical activity:    Days per week: Not on file    Minutes per session: Not on file  . Stress: Not on file  Relationships  . Social connections:    Talks on phone: Not on file    Gets together: Not on file    Attends religious service: Not on file    Active member of club or organization: Not on file    Attends meetings of clubs or organizations: Not on file    Relationship status: Not on file  Other Topics Concern  . 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 acetaminophen (TYLENOL) 650 MG CR tablet Take 1,300 mg by mouth every 8 (eight) hours as needed for pain.  . Aloe Vera GEL Apply 1 application topically as needed (rectal pain).   Marland Kitchen aspirin (BAYER ASPIRIN EC LOW DOSE) 81 MG EC tablet Take 81 mg by mouth daily.    Marland Kitchen Bioflavonoid Products (ESTER C PO) Take 1,000 mg by mouth daily.   . Coenzyme Q10 (COQ-10) 100 MG CAPS Take 200 mg by mouth daily.   . famotidine-calcium carbonate-magnesium hydroxide (PEPCID COMPLETE) 10-800-165 MG chewable tablet Chew 1 tablet by mouth daily as needed.  . loratadine (CLARITIN) 10 MG tablet Take 10 mg by mouth daily.   . Menthol, Topical Analgesic, (ICY HOT EX) Apply 1 application topically as needed (Shoulder pain).  . Multiple Vitamins-Minerals (MULTIVITAL) tablet Take 1 tablet by mouth daily.    Marland Kitchen NASAL SPRAY SALINE NA Place 1 spray into both nostrils as needed (Congestion).  Marland Kitchen ofloxacin (OCUFLOX) 0.3 % ophthalmic solution Place 1-2 drops into the left eye 4 (four) times daily.  . pantoprazole (PROTONIX) 40 MG tablet TAKE 1 TABLET BY MOUTH IN THE MORNING .  MUST  HAVE  OFFICE  VISIT  BEFORE  FURTHER  REFILLS.  Marland Kitchen polyethylene glycol powder (MIRALAX) powder Take 17 g by mouth daily.    . pravastatin (PRAVACHOL) 40 MG tablet Take 1 tablet (40 mg total) by mouth daily.  . simethicone (MYLICON) 704 MG chewable tablet Chew 125 mg by mouth every 6 (six) hours as needed for flatulence.  . [DISCONTINUED] Glucosamine Sulfate (GLUCOSAMINE RELIEF) 1000 MG TABS Take 1,000 mg by mouth 2 (two) times daily.   Facility-Administered Encounter Medications as of 01/20/2018  Medication  . 0.9 %  sodium chloride infusion    Activities of Daily Living In your present state of health, do you have any difficulty performing the following  activities: 01/20/2018  Hearing? N  Vision? Y  Difficulty concentrating or making decisions? Y  Walking or climbing stairs? N  Dressing or bathing? N  Doing errands, shopping? N  Preparing Food and eating ? N  Using the Toilet? N  In the past six months, have you  accidently leaked urine? N  Do you have problems with loss of bowel control? N  Managing your Medications? N  Managing your Finances? N  Housekeeping or managing your Housekeeping? N  Some recent data might be hidden    Patient Care Team: Tonia Ghent, MD as PCP - General (Family Medicine)   Assessment:   This is a routine wellness examination for Gwyndolyn Saxon.   Hearing Screening   125Hz  250Hz  500Hz  1000Hz  2000Hz  3000Hz  4000Hz  6000Hz  8000Hz   Right ear:   40 40 40  0    Left ear:   40 40 40  0      Exercise Activities and Dietary recommendations Current Exercise Habits: Home exercise routine, Type of exercise: strength training/weights, Frequency (Times/Week): 7, Intensity: Mild, Exercise limited by: None identified  Goals    . DIET - INCREASE WATER INTAKE     Starting 01/20/2018, I will continue to drink at least 6-8 glasses of water daily.        Fall Risk Fall Risk  01/20/2018 01/08/2017 01/03/2016 01/01/2015 12/29/2013  Falls in the past year? No No Yes Yes Yes  Number falls in past yr: - - - 1 1  Injury with Fall? - - - No Yes   Depression Screen PHQ 2/9 Scores 01/20/2018 01/08/2017 01/03/2016 01/01/2015  PHQ - 2 Score 0 0 0 0  PHQ- 9 Score 0 - - -    Cognitive Function MMSE - Mini Mental State Exam 01/20/2018  Orientation to time 5  Orientation to Place 5  Registration 3  Attention/ Calculation 0  Recall 0  Recall-comments unable to recall 3 of 3 words  Language- name 2 objects 0  Language- repeat 1  Language- follow 3 step command 2  Language- follow 3 step command-comments unable to follow 1 step of 3 step command  Language- read & follow direction 0  Write a sentence 0  Copy design 0  Total score 16      PLEASE NOTE: A Mini-Cog screen was completed. Maximum score is 20. A value of 0 denotes this part of Folstein MMSE was not completed or the patient failed this part of the Mini-Cog screening.   Mini-Cog Screening Orientation to Time - Max 5 pts Orientation to Place - Max 5 pts Registration - Max 3 pts Recall - Max 3 pts Language Repeat - Max 1 pts Language Follow 3 Step Command - Max 3 pts     Immunization History  Administered Date(s) Administered  . Influenza Split 02/05/2012  . Influenza,inj,Quad PF,6+ Mos 02/23/2013, 02/07/2015, 01/03/2016, 01/08/2017, 01/20/2018  . Pneumococcal Conjugate-13 01/01/2015  . Pneumococcal Polysaccharide-23 08/24/2006  . Td 08/13/2004  . Zoster 11/14/2010    Screening Tests Health Maintenance  Topic Date Due  . TETANUS/TDAP  05/05/2019 (Originally 08/14/2014)  . INFLUENZA VACCINE  Completed  . PNA vac Low Risk Adult  Completed     Plan:     I have personally reviewed, addressed, and noted the following in the patient's chart:  A. Medical and social history B. Use of alcohol, tobacco or illicit drugs  C. Current medications and supplements D. Functional ability and status E.  Nutritional status F.  Physical activity G. Advance directives H. List of other physicians I.  Hospitalizations, surgeries, and ER visits in previous 12 months J.  [place] to include hearing, vision, cognitive, depression L. Referrals and appointments - none  In addition, I have reviewed and discussed with patient certain preventive protocols, quality metrics, and best  practice recommendations. A written personalized care plan for preventive services as well as general preventive health recommendations were provided to patient.  See attached scanned questionnaire for additional information.   Signed,   Lindell Noe, MHA, BS, LPN Health Coach

## 2018-01-24 NOTE — Progress Notes (Signed)
PCP notes:   Health maintenance:  Flu vaccine - administered  Abnormal screenings:   Hearing - failed  Hearing Screening   125Hz  250Hz  500Hz  1000Hz  2000Hz  3000Hz  4000Hz  6000Hz  8000Hz   Right ear:   40 40 40  0    Left ear:   40 40 40  0     Mini-Cog score: 16/20 MMSE - Mini Mental State Exam 01/20/2018  Orientation to time 5  Orientation to Place 5  Registration 3  Attention/ Calculation 0  Recall 0  Recall-comments unable to recall 3 of 3 words  Language- name 2 objects 0  Language- repeat 1  Language- follow 3 step command 2  Language- follow 3 step command-comments unable to follow 1 step of 3 step command  Language- read & follow direction 0  Write a sentence 0  Copy design 0  Total score 16    Patient concerns:   None  Nurse concerns:  None  Next PCP appt:   02/01/18 @ 1600

## 2018-01-25 NOTE — Progress Notes (Signed)
   Subjective:    Patient ID: Jeffrey [name], male    DOB: 16-Oct-1928, 84 y.o.   MRN: [id number]  HPI I reviewed health advisor's note, was available for consultation, and agree with documentation and plan.    Review of Systems     Objective:   Physical Exam         Assessment & Plan:

## 2018-02-01 ENCOUNTER — Ambulatory Visit (INDEPENDENT_AMBULATORY_CARE_PROVIDER_SITE_OTHER): Payer: Medicare Other | Admitting: Family Medicine

## 2018-02-01 ENCOUNTER — Encounter: Payer: Self-pay | Admitting: Family Medicine

## 2018-02-01 VITALS — BP 120/70 | HR 69 | Temp 97.5°F | Ht 65.5 in | Wt 145.5 lb

## 2018-02-01 DIAGNOSIS — R413 Other amnesia: Secondary | ICD-10-CM | POA: Diagnosis not present

## 2018-02-01 DIAGNOSIS — E785 Hyperlipidemia, unspecified: Secondary | ICD-10-CM | POA: Diagnosis not present

## 2018-02-01 DIAGNOSIS — J309 Allergic rhinitis, unspecified: Secondary | ICD-10-CM | POA: Diagnosis not present

## 2018-02-01 MED ORDER — PRAVASTATIN SODIUM 40 MG PO TABS: 40.0000 mg | ORAL_TABLET | Freq: Every day | ORAL | 3 refills | Status: DC

## 2018-02-01 NOTE — Patient Instructions (Addendum)
Keep using loratadine daily and use nasal saline. I wouldn't change your meds for now.  I hope that getting the eyelid irritation will help with some of the nasal symptoms.  Go to the lab on the way out.  We'll contact you with your lab report. We may need to check a scan of your brain after the labs are resulted.  Let me get the labs and we'll go from there.  Take care.  Glad to see you.

## 2018-02-01 NOTE — Assessment & Plan Note (Signed)
This is a bigger issue.  He does not know what year it is.  He needs redirection.  His mentation is different compared to previous conversations we have had.  I discussed this with his wife when the patient was having blood drawn.  He needs routine labs done today.  Based on his labs he will likely need follow-up head CT.  At that point will need to either discuss referral to neurology and/or formal testing here along with potential medication treatment. >25 minutes spent in face to face time with patient, >50% spent in counselling or coordination of care.

## 2018-02-01 NOTE — Assessment & Plan Note (Signed)
Continue current allergy medications.  See listed meds.  No change in orders.  Unclear how much treatment for blepharitis would help with excessive tearing and possible rhinorrhea.  No change in meds for now.  I do not want to increase his antihistamine use and potentially make his memory situation worse by inducing sedation.

## 2018-02-01 NOTE — Progress Notes (Signed)
Memory changes.  He has been repeating himself more.  Wife has noted it some.  No episodes of getting lost. No red flag events.   Hearing screen failed.  Declined hearing aids.    GERD.  On PPI per GI clinic.  He is taking mylicon at baseline.  He has a lot of gas.  He tired taking lactaid but still needs simethicone- that seems to help.  He has GI f/u pending.   He "graduated" from colon cancer screening, d/w pt, based on his age.  Elevated Cholesterol: Using medications without problems:yes Muscle aches: no Diet compliance: yes Exercise: limited.   He has noted environmental allergies with rhinorrhea and postnasal drip.  No fevers.  This is a baseline issue for the patient.  Long-standing.  On doxy per outside clinic for blepharitis.  I'll defer.  Recently started.  He is using lid scrubs.   PMH and SH reviewed  Meds, vitals, and allergies reviewed.   ROS: Per HPI unless specifically indicated in ROS section   GEN: nad, alert but not oriented to year.  See below. HEENT: mucous membranes moist, eyelid irritation x4 NECK: supple w/o LA CV: rrr. PULM: ctab, no inc wob ABD: soft, +bs EXT: no edema SKIN: no acute rash  His speech is fluent but he frequently repeats himself.  He is tangential in conversation and needs redirection.  He does not know the year.  When I asked him what year was to test his memory he said, "I did know you were going to surprise me like that."  He has 3 out of 3 attention and 0 out of 3 on recall.

## 2018-02-01 NOTE — Assessment & Plan Note (Signed)
No change in meds at this point.  Labs discussed with patient. 

## 2018-02-02 LAB — CBC WITH DIFFERENTIAL/PLATELET
Basophils Absolute: 0.1 10*3/uL (ref 0.0–0.1)
Basophils Relative: 1.3 % (ref 0.0–3.0)
Eosinophils Absolute: 0.1 10*3/uL (ref 0.0–0.7)
Eosinophils Relative: 0.7 % (ref 0.0–5.0)
HCT: 44.2 % (ref 39.0–52.0)
Hemoglobin: 15.5 g/dL (ref 13.0–17.0)
Lymphocytes Relative: 15.8 % (ref 12.0–46.0)
Lymphs Abs: 1.5 10*3/uL (ref 0.7–4.0)
MCHC: 35.1 g/dL (ref 30.0–36.0)
MCV: 91.9 fl (ref 78.0–100.0)
Monocytes Absolute: 1 10*3/uL (ref 0.1–1.0)
Monocytes Relative: 10.5 % (ref 3.0–12.0)
Neutro Abs: 7 10*3/uL (ref 1.4–7.7)
Neutrophils Relative %: 71.7 % (ref 43.0–77.0)
Platelets: 177 10*3/uL (ref 150.0–400.0)
RBC: 4.81 Mil/uL (ref 4.22–5.81)
RDW: 12.6 % (ref 11.5–15.5)
WBC: 9.7 10*3/uL (ref 4.0–10.5)

## 2018-02-02 LAB — RPR: RPR Ser Ql: NONREACTIVE

## 2018-02-02 LAB — TSH: TSH: 1.81 u[IU]/mL (ref 0.35–4.50)

## 2018-02-02 LAB — VITAMIN B12: Vitamin B-12: 565 pg/mL (ref 211–911)

## 2018-02-03 ENCOUNTER — Other Ambulatory Visit: Payer: Self-pay | Admitting: Family Medicine

## 2018-02-03 DIAGNOSIS — R413 Other amnesia: Secondary | ICD-10-CM

## 2018-02-18 ENCOUNTER — Other Ambulatory Visit: Payer: Self-pay | Admitting: Gastroenterology

## 2018-02-18 ENCOUNTER — Telehealth: Payer: Self-pay | Admitting: Gastroenterology

## 2018-02-18 DIAGNOSIS — K219 Gastro-esophageal reflux disease without esophagitis: Secondary | ICD-10-CM

## 2018-02-18 NOTE — Telephone Encounter (Signed)
Prescription refilled and patient notified to keep appt for further refills.

## 2018-02-19 ENCOUNTER — Ambulatory Visit (INDEPENDENT_AMBULATORY_CARE_PROVIDER_SITE_OTHER)
Admission: RE | Admit: 2018-02-19 | Discharge: 2018-02-19 | Disposition: A | Discharge status: Home / Self Care | Payer: Medicare Other | Source: Ambulatory Visit | Attending: Family Medicine | Admitting: Family Medicine

## 2018-02-19 ENCOUNTER — Encounter (INDEPENDENT_AMBULATORY_CARE_PROVIDER_SITE_OTHER): Payer: Self-pay

## 2018-02-19 DIAGNOSIS — R413 Other amnesia: Secondary | ICD-10-CM

## 2018-02-23 ENCOUNTER — Encounter: Payer: Self-pay | Admitting: Family Medicine

## 2018-02-23 ENCOUNTER — Ambulatory Visit (INDEPENDENT_AMBULATORY_CARE_PROVIDER_SITE_OTHER): Payer: Medicare Other | Admitting: Family Medicine

## 2018-02-23 VITALS — BP 122/60 | HR 74 | Temp 97.8°F | Ht 65.5 in | Wt 149.0 lb

## 2018-02-23 DIAGNOSIS — J309 Allergic rhinitis, unspecified: Secondary | ICD-10-CM

## 2018-02-23 DIAGNOSIS — R413 Other amnesia: Secondary | ICD-10-CM | POA: Diagnosis not present

## 2018-02-23 MED ORDER — MOMETASONE FUROATE 50 MCG/ACT NA SUSP: 1.0000 | NASAL | Status: DC

## 2018-02-23 NOTE — Patient Instructions (Signed)
I remain concerned about your memory.  Please allow me to check your memory on the day of your choosing.  Or we can refer you to the neurology clinic.  I need to find out how much memory loss you are having so that we can try to prevent this from getting worse.   Try using nasonex 1 spray in each nostril twice a week.  See if that helps without causing nosebleeds.    Take care.  Glad to see you.

## 2018-02-23 NOTE — Progress Notes (Signed)
Here today to discuss memory changes.  See previous notes.  He previously did not have good short-term recall or normal orientation at previous visit.  He had unremarkable lab testing.  CT with brain atrophy noted.  Discussed all of that with patient and wife.  His wife had been sick, required inpatient stay.  He was able to navigate the hospital without getting lost.  He is living at home with his wife currently.  He refused memory testing, stating "today is not a good day".  He wanted to talk about his allergy symptoms.  He has been having a lot of runny nose and stuffiness.  He appears to have significant environmental allergies.  He had nosebleeds with Flonase previously.  Still taking loratadine at baseline.  Meds, vitals, and allergies reviewed.   ROS: Per HPI unless specifically indicated in ROS section   nad  He declined memory testing at this point.  I told him and his wife explicitly that he had previous abnormal memory testing, an abnormal brain exam, and I needed to check his memory to find out more about his condition.  He still declined.  I asked him to let me ask him one additional question.  He consented.  I asked him what year it was and he could not tell me.  Eventually he was able to say "today is 02/23/2018"  At that point I asked him what year it was and he still could not answer the question.  His wife looked at him and said "you know it is 2019".  At that point he said "it is 2019".  I asked his wife not to provide answers to the patient during memory testing.  ncat Tm wnl Nasal exam stuffy. Oropharynx within normal limits, mucous membranes moist rrr ctab Ext w/o edema

## 2018-02-24 ENCOUNTER — Ambulatory Visit (INDEPENDENT_AMBULATORY_CARE_PROVIDER_SITE_OTHER): Payer: Medicare Other | Admitting: Gastroenterology

## 2018-02-24 ENCOUNTER — Encounter: Payer: Self-pay | Admitting: Gastroenterology

## 2018-02-24 ENCOUNTER — Telehealth: Payer: Self-pay | Admitting: Gastroenterology

## 2018-02-24 VITALS — BP 110/60 | HR 76 | Ht 65.5 in | Wt 148.6 lb

## 2018-02-24 DIAGNOSIS — R1084 Generalized abdominal pain: Secondary | ICD-10-CM | POA: Diagnosis not present

## 2018-02-24 DIAGNOSIS — K219 Gastro-esophageal reflux disease without esophagitis: Secondary | ICD-10-CM

## 2018-02-24 MED ORDER — PANTOPRAZOLE SODIUM 40 MG PO TBEC: DELAYED_RELEASE_TABLET | ORAL | 11 refills | Status: DC

## 2018-02-24 NOTE — Progress Notes (Signed)
    History of Present Illness: This is an 84 year old male returning for follow-up of GERD he complains of generalized abdominal tightness.  He is recently been evaluated by Dr. Damita Dunnings for memory changes.  He presumably has some form of dementia that has not been fully evaluated.  At yesterday's office visit with Dr. Damita Dunnings the patient declined to complete memory testing or proceed with neurology referral.  Patient repeatedly relates that his abdomen has felt tight and mildly painful for about 1 year.  Symptoms could be be slightly worse after meals but they seem to be present all the time. He is unable to further characterize his symptoms.  He has tried simethicone which may have helped slightly but it is not clear cut.  His reflux symptoms are well controlled.  He and his wife state that he takes Protonix daily as prescribed.  He states he has regular bowel movements.  His appetite is good and his weight is stable. Denies weight loss, constipation, diarrhea, change in stool caliber, melena, hematochezia, nausea, vomiting, dysphagia, reflux symptoms, chest pain.   Current Medications, Allergies, Past Medical History, Past Surgical History, Family History and Social History were reviewed in [employer] record.  Physical Exam: General: Well developed, well nourished, no acute distress Head: Normocephalic and atraumatic Eyes:  sclerae anicteric, EOMI Ears: Normal auditory acuity Mouth: No deformity or lesions Lungs: Clear throughout to auscultation Heart: Regular rate and rhythm; no murmurs, rubs or bruits Abdomen: Soft, non tender and non distended. No masses, hepatosplenomegaly or hernias noted. Normal Bowel sounds Rectal: Not done Musculoskeletal: Symmetrical with no gross deformities  Pulses:  Normal pulses noted Extremities: No clubbing, cyanosis, edema or deformities noted Neurological: Alert oriented x 4, grossly nonfocal Psychological:  Alert and cooperative. Normal  mood and affect   Assessment and Recommendations:  1.  Generalized abdominal tightness and generalized mild abdominal pain. Abdominal exam is benign.  Blood work in September unremarkable.  EGD in August 2018 showed only a small hiatal hernia.  Etiology of his symptoms is unclear.  Schedule abdominal/pelvic CT.   2.  GERD.  Renew pantoprazole 40 mg daily.  Pepcid Complete daily as needed.  Follow standard antireflux measures.

## 2018-02-24 NOTE — Assessment & Plan Note (Signed)
He can try Nasonex 1 spray in each nostril twice a week to see if he can tolerate that without nosebleed and to see if it will help his symptoms.

## 2018-02-24 NOTE — Assessment & Plan Note (Signed)
Unfortunately, I presume he has some form of dementia.  He would not let me test his memory more extensively today.  I cannot force him to do this.  It would likely be counterproductive to argue with the patient.  I asked him and his wife to consider his options and either come back on a "good" day and let me recheck his memory or let me refer him over to neurology.  I asked his wife not to provide any answers during future memory testing.  I told the patient and his wife that he may unfortunately have a progressive memory loss and the sooner he begins treatment for it sooner he may be able to stabilize his condition.  >25 minutes spent in face to face time with patient, >50% spent in counselling or coordination of care

## 2018-02-24 NOTE — Telephone Encounter (Signed)
Informed patient's wife of CT scan scheduled for 03/05/18 at 3:00pm. Went over instructions for scan and also to have patient come by our basement lab to get labs drawn prior to scan. Patient verbalized understanding.

## 2018-02-24 NOTE — Telephone Encounter (Signed)
Pt's wife returned your call, she was not sure if it is something related to her or her husband who saw Dr. Fuller Plan today. Pls call her back.

## 2018-02-24 NOTE — Patient Instructions (Signed)
We have sent the following medications to your pharmacy for you to pick up at your convenience: pantoprazole.   You have been scheduled for a CT scan of the abdomen and pelvis at [facility] ([address]---this is in the same building as [facility]).   You are scheduled on __________ at _________. You should arrive 15 minutes prior to your appointment time for registration. Please follow the written instructions below on the day of your exam:  WARNING: IF YOU ARE ALLERGIC TO IODINE/X-RAY DYE, PLEASE NOTIFY RADIOLOGY IMMEDIATELY AT [phone]! YOU WILL BE GIVEN A 13 HOUR PREMEDICATION PREP.  1) Do not eat or drink anything after ________ (4 hours prior to your test) 2) You have been given 2 bottles of oral contrast to drink. The solution may taste better if refrigerated, but do NOT add ice or any other liquid to this solution. Shake well before drinking.    Drink 1 bottle of contrast @ _______ (2 hours prior to your exam)  Drink 1 bottle of contrast @ _______ (1 hour prior to your exam)  You may take any medications as prescribed with a small amount of water, if necessary. If you take any of the following medications: METFORMIN, GLUCOPHAGE, GLUCOVANCE, AVANDAMET, RIOMET, FORTAMET, [place] MET, JANUMET, GLUMETZA or METAGLIP, you MAY be asked to HOLD this medication 48 hours AFTER the exam.  The purpose of you drinking the oral contrast is to aid in the visualization of your intestinal tract. The contrast solution may cause some diarrhea. Depending on your individual set of symptoms, you may also receive an intravenous injection of x-ray contrast/dye. Plan on being at [facility] for 30 minutes or longer, depending on the type of exam you are having performed.  This test typically takes 30-45 minutes to complete.  If you have any questions regarding your exam or if you need to reschedule, you may call the CT department at [phone] between the hours of 8:00 am  and 5:00 pm, Monday-Friday.  ________________________________________________________________________  Thank you for choosing me and [facility].  Pricilla Riffle. Dagoberto Ligas., MD., [facility] Regal

## 2018-02-25 ENCOUNTER — Other Ambulatory Visit (INDEPENDENT_AMBULATORY_CARE_PROVIDER_SITE_OTHER): Payer: Medicare Other

## 2018-02-25 DIAGNOSIS — K219 Gastro-esophageal reflux disease without esophagitis: Secondary | ICD-10-CM | POA: Diagnosis not present

## 2018-02-25 DIAGNOSIS — R1084 Generalized abdominal pain: Secondary | ICD-10-CM | POA: Diagnosis not present

## 2018-02-25 LAB — CREATININE, SERUM: Creatinine, Ser: 1.07 mg/dL (ref 0.40–1.50)

## 2018-02-25 LAB — BUN: BUN: 14 mg/dL (ref 6–23)

## 2018-03-05 ENCOUNTER — Ambulatory Visit (INDEPENDENT_AMBULATORY_CARE_PROVIDER_SITE_OTHER)
Admission: RE | Admit: 2018-03-05 | Discharge: 2018-03-05 | Disposition: A | Discharge status: Home / Self Care | Payer: Medicare Other | Source: Ambulatory Visit | Attending: Gastroenterology | Admitting: Gastroenterology

## 2018-03-05 DIAGNOSIS — R1084 Generalized abdominal pain: Secondary | ICD-10-CM

## 2018-03-05 DIAGNOSIS — K449 Diaphragmatic hernia without obstruction or gangrene: Secondary | ICD-10-CM | POA: Diagnosis not present

## 2018-03-05 MED ORDER — IOPAMIDOL (ISOVUE-300) INJECTION 61%
100.0000 mL | Freq: Once | INTRAVENOUS | Status: AC | PRN
  Administered 2018-03-05: 100 mL via INTRAVENOUS

## 2018-03-11 ENCOUNTER — Encounter: Payer: Self-pay | Admitting: Family Medicine

## 2018-03-11 ENCOUNTER — Ambulatory Visit (INDEPENDENT_AMBULATORY_CARE_PROVIDER_SITE_OTHER): Payer: Medicare Other | Admitting: Family Medicine

## 2018-03-11 VITALS — BP 138/64 | HR 67 | Temp 97.8°F | Ht 65.5 in | Wt 146.2 lb

## 2018-03-11 DIAGNOSIS — R413 Other amnesia: Secondary | ICD-10-CM

## 2018-03-11 DIAGNOSIS — H6120 Impacted cerumen, unspecified ear: Secondary | ICD-10-CM

## 2018-03-11 DIAGNOSIS — J309 Allergic rhinitis, unspecified: Secondary | ICD-10-CM

## 2018-03-11 MED ORDER — MOMETASONE FUROATE 50 MCG/ACT NA SUSP: 1.0000 | NASAL | 2 refills | Status: DC

## 2018-03-11 NOTE — Patient Instructions (Signed)
Try nasonex.  See if you can tolerate that.   If you are willing to go to the neurology clinic, then we can get you an appointment.  Let us know if you are willing to go.   Rinse your left ear to get the last little bit of wax out.   Take care.  Glad to see you.

## 2018-03-11 NOTE — Progress Notes (Signed)
Previously noted that wife would be designated if patient were incapacitated.  She was at the office visit today as usual.  She is fully aware of all of his medical issues and had a chance to answer questions about any of his conditions.  All questions were answered to the best of my ability.  If the patient is capable of making his own decisions then he could obviously make his own choices.  If he were in capable of making his own choices, his wife would be next in line for decision-making authority.  All of the information discussed in this note was presented to both of them today.  Patient has a lung nodule. He is a distant former smoker. Reasonable to consider follow-up CAT scan in 1 year. I put a reminder in the EMR about that.  Discussed with patient at office visit.  He has prostate enlargement which is expected given his age. He does have some cholesterol deposits in his aorta. His LDL is controlled on the cholesterol medication. I would continue that for now.  Discussed with patient.  Memory loss.  He again has difficulty knowing what year it is.  He finally consented to testing his memory today on a brief exam.  He will stop in the middle of that and began talking about the fact that he has "bad allergies" and he will talk about that as though we have never discussed it before, even though we discussed multiple times previously and again multiple times today.  I told both him and his wife that I suspect he has a significant memory loss that will likely affect his life.  See exam below.  "Allergies."  Discussed with patient about attempted nasal steroid trial with Nasonex.  He did not get it previously.  Discussed about getting medication filled as a prescription if they could not get it over-the-counter.  He goes back throughout the conversation and repeats himself about his allergies.  PMH and SH reviewed  ROS: Per HPI unless specifically indicated in ROS section   Meds, vitals, and  allergies reviewed.   GEN: nad, alert and initially not oriented to the year.  With significant delay he can remember what year it is.  He then goes on to correctly list the date and month, etc.  MMSE 26/30, missing 1 with location, CIN-1 with attention and calculation, and missing 2 with recall.  His true score may be 25 out of 30.  He initially failed drawing interlocking pentagons but was able to draw it more appropriately on the second try. HEENT: mucous membranes moist, significant cerumen impaction in both ear canals removed with curette.  Recheck normal.  He felt better afterwards.  He has minimal residual cerumen in the left canal but it does not need removal at this point. NECK: supple w/o LA CV: rrr. PULM: ctab, no inc wob ABD: soft, +bs EXT: no edema SKIN: no acute rash

## 2018-03-14 ENCOUNTER — Encounter: Payer: Self-pay | Admitting: Family Medicine

## 2018-03-14 DIAGNOSIS — H6120 Impacted cerumen, unspecified ear: Secondary | ICD-10-CM | POA: Insufficient documentation

## 2018-03-14 NOTE — Assessment & Plan Note (Addendum)
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  His wife is aware.  Even if he lost capacity to make informed decisions, then she already knows about his situation.  I have encouraged neurology follow-up.  I cannot make him follow through with this.  I have offered multiple times.  Both he and his wife have had a chance to become aware of the situation.  Routine cautions given. >25 minutes spent in face to face time with patient, >50% spent in counselling or coordination of care.

## 2018-03-14 NOTE — Assessment & Plan Note (Signed)
Resolved with curette.  See after visit summary.

## 2018-03-14 NOTE — Assessment & Plan Note (Signed)
He is fixated on this.  He repeats items in the conversation like he has never told me before.  He can try Nasonex.  Prescription printed.

## 2018-05-13 DIAGNOSIS — H1032 Unspecified acute conjunctivitis, left eye: Secondary | ICD-10-CM | POA: Diagnosis not present

## 2018-05-31 DIAGNOSIS — H25013 Cortical age-related cataract, bilateral: Secondary | ICD-10-CM | POA: Diagnosis not present

## 2018-05-31 DIAGNOSIS — H524 Presbyopia: Secondary | ICD-10-CM | POA: Diagnosis not present

## 2018-05-31 DIAGNOSIS — H2513 Age-related nuclear cataract, bilateral: Secondary | ICD-10-CM | POA: Diagnosis not present

## 2018-05-31 DIAGNOSIS — H43813 Vitreous degeneration, bilateral: Secondary | ICD-10-CM | POA: Diagnosis not present

## 2018-07-19 ENCOUNTER — Telehealth: Payer: Self-pay | Admitting: Family Medicine

## 2018-07-19 DIAGNOSIS — R413 Other amnesia: Secondary | ICD-10-CM

## 2018-07-19 NOTE — Telephone Encounter (Signed)
Patients wife called to request a Neurology consult for his memory loss. They had a previous Referral but declined to accept it and she feels he is ready to go now. They prefer to go to [facility], please place Referral

## 2018-07-19 NOTE — Telephone Encounter (Signed)
Ordered. Thanks

## 2018-10-07 ENCOUNTER — Telehealth: Payer: Self-pay | Admitting: Neurology

## 2018-10-07 NOTE — Telephone Encounter (Signed)
Due to current COVID 19 pandemic, our office is severely reducing in office visits until further notice, in order to minimize the risk to our patients and healthcare providers.   Called patient to offer a virtual visit for 6/10 appointment. I spoke with patient's wife who declined the virtual visit as they do not have the resources to participate this way Patient accepted in office visit. I explained that we are taking necessary precautions to keep patients and staff safe by only allowing just a few patients to come back into the office. Patient understands that when he arrives he will need to pull car up to the entrance and wait for direction from a staff member. He is aware that he will have his temp taken and be asked screening questions about COVID-19.

## 2018-10-13 ENCOUNTER — Encounter: Payer: Self-pay | Admitting: Neurology

## 2018-10-13 ENCOUNTER — Ambulatory Visit (INDEPENDENT_AMBULATORY_CARE_PROVIDER_SITE_OTHER): Payer: Medicare Other | Admitting: Neurology

## 2018-10-13 ENCOUNTER — Other Ambulatory Visit: Payer: Self-pay

## 2018-10-13 VITALS — BP 150/65 | HR 69 | Temp 98.6°F | Ht 67.0 in | Wt 145.3 lb

## 2018-10-13 DIAGNOSIS — R413 Other amnesia: Secondary | ICD-10-CM | POA: Diagnosis not present

## 2018-10-13 MED ORDER — DONEPEZIL HCL 5 MG PO TABS: 5.0000 mg | ORAL_TABLET | Freq: Every day | ORAL | 1 refills | Status: DC

## 2018-10-13 NOTE — Patient Instructions (Signed)
We will start aricept for the memory.  Begin Aricept (donepezil) at 5 mg at night for one month. If this medication is well-tolerated, please call our office and we will call in a prescription for the 10 mg tablets. Look out for side effects that may include nausea, diarrhea, weight loss, or stomach cramps. This medication will also cause a runny nose, therefore there is no need for allergy medications for this purpose.  

## 2018-10-13 NOTE — Progress Notes (Signed)
Reason for visit: Memory disturbance  Referring physician: Dr. Genice Rouge is a 85 y.o. male  History of present illness:  [name] is an 85 year old left-handed white male with a history of a memory disturbance that is been present for about 1 year.  The patient comes in with his wife today.  He has noted some short-term memory issues, he will repeat himself at times.  He reports that he will occasionally misplace things about the house.  He has not given up anything in terms of activities daily living because of memory but he claims that his wife has always managed the medications and appointments.  The patient does some of the finances, he does well with this.  He continues to operate a motor vehicle without difficulty.  He denies any significant issues with getting lost or any safety issues while driving.  The patient denies any significant family history of memory problems.  Both of his parents lived into their 55s.  He denies any numbness or weakness of the extremities or face.  He denies any significant balance issues although he did fall recently in the yard when he tripped over a garden hose.  He is sleeping well at night, he has a good energy level during the day.  He has undergone recent blood work that included an RPR and B12 level that were unremarkable, CT scan of the brain was done showing some cortical atrophy.  The patient is sent to this office for an evaluation.  Past Medical History:  Diagnosis Date   Adenomatous polyp of colon 1995    w/ HGD   Allergic rhinitis    BPH (benign prostatic hyperplasia)    Diverticulosis 09/21/2000   ED (erectile dysfunction)    Hyperlipemia 05/05/1988   Memory loss    Schamberg's purpura    2012- eval by Dr. Allyson Sabal   Sebaceous cyst    Skin cancer 05/05/1998   hx of Scalp-melanoma   TIA (transient ischemic attack)     Past Surgical History:  Procedure Laterality Date   Carotid U/S Nml  09/08/2007   Colon  polyp  2011   B9 Int Hemms   Colonoscopy polyps  10/05/2003   Divertics in hemms   COLONOSCOPY W/ POLYPECTOMY  08/1993; 09/21/2000;10/05/2003   B9   Echo (other)  09/08/2007   nml lvf EF 50-55% Mild Diast Dysfctn Triv Ar   melanoma scalp  07/2003   SEPTOPLASTY  1995   Skin revision vertex of scalp  2009   Dr. Luetta Nutting, [facility]   TONSILLECTOMY      Family History  Problem Relation Age of Onset   Rectal cancer Father    Colon cancer Paternal Aunt    Other Paternal Aunt        cancer on back of neck   Prostate cancer Neg Hx     Social history:  reports that he quit smoking about 60 years ago. His smoking use included cigarettes. He quit after 5.00 years of use. He has never used smokeless tobacco. He reports that he does not drink alcohol or use drugs.  Medications:  Prior to Admission medications   Medication Sig Start Date End Date Taking? Authorizing Provider  acetaminophen (TYLENOL) 650 MG CR tablet Take 1,300 mg by mouth every 8 (eight) hours as needed for pain.   Yes [provider]  Aloe Vera GEL Apply 1 application topically as needed (rectal pain).    Yes [provider]  aspirin (  BAYER ASPIRIN EC LOW DOSE) 81 MG EC tablet Take 81 mg by mouth daily.     Yes [provider]  Coenzyme Q10 (COQ-10) 100 MG CAPS Take 200 mg by mouth daily.    Yes [provider]  loratadine (CLARITIN) 10 MG tablet Take 10 mg by mouth daily.    Yes [provider]  Multiple Vitamins-Minerals (MULTIVITAL) tablet Take 1 tablet by mouth daily.     Yes [provider]  NASAL SPRAY SALINE NA Place 1 spray into both nostrils as needed (Congestion).   Yes [provider]  pantoprazole (PROTONIX) 40 MG tablet TAKE 1 TABLET BY MOUTH ONCE DAILY IN THE MORNING . 02/24/18  Yes Ladene Artist, MD  polyethylene glycol powder (MIRALAX) powder Take 17 g by mouth daily.     Yes [provider]  pravastatin (PRAVACHOL) 40 MG tablet Take 1 tablet  (40 mg total) by mouth daily. 02/01/18  Yes Tonia Ghent, MD  simethicone (MYLICON) 619 MG chewable tablet Chew 125 mg by mouth every 6 (six) hours as needed for flatulence.   Yes [provider]      Allergies  Allergen Reactions   Flonase [Fluticasone Propionate] Other (See Comments)    Nosebleeds.     Tamsulosin     REACTION: flushed and arms felt stiff    ROS:  Out of a complete 14 system review of symptoms, the patient complains only of the following symptoms, and all other reviewed systems are negative.  Memory loss Eye pain Ringing in the ears Allergies  Blood pressure (!) 150/65, pulse 69, temperature 98.6 F (37 C), temperature source Oral, height 5\' 7"  (1.702 m), weight 145 lb 5 oz (65.9 kg).  Physical Exam  General: The patient is alert and cooperative at the time of the examination.  Eyes: Pupils are equal, round, and reactive to light. Discs are flat bilaterally.  Neck: The neck is supple, no carotid bruits are noted.  Respiratory: The respiratory examination is clear.  Cardiovascular: The cardiovascular examination reveals a regular rate and rhythm, no obvious murmurs or rubs are noted.  Skin: Extremities are with 2+ edema below the knee on the right, trace edema in the left foot.  Neurologic Exam  Mental status: The patient is alert and oriented x 3 at the time of the examination. The Mini-Mental status examination done today shows a total score 23/30.  Cranial nerves: Facial symmetry is present. There is good sensation of the face to pinprick and soft touch bilaterally. The strength of the facial muscles and the muscles to head turning and shoulder shrug are normal bilaterally. Speech is well enunciated, no aphasia or dysarthria is noted. Extraocular movements are full. Visual fields are full. The tongue is midline, and the patient has symmetric elevation of the soft palate. No obvious hearing deficits are noted.  Motor: The motor testing  reveals 5 over 5 strength of all 4 extremities. Good symmetric motor tone is noted throughout.  Sensory: Sensory testing is intact to pinprick, soft touch, vibration sensation, and position sense on all 4 extremities. No evidence of extinction is noted.  Coordination: Cerebellar testing reveals good finger-nose-finger and heel-to-shin bilaterally.  Gait and station: Gait is normal. Tandem gait is slightly unsteady. Romberg is negative. No drift is seen.  Reflexes: Deep tendon reflexes are symmetric and normal bilaterally. Toes are downgoing bilaterally.   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  No interval change  or acute process by noncontrast CT.  Incidental peri callosum lipoma.  * CT scan images were reviewed online. I agree with the written report.    Assessment/Plan:  1.  Memory disorder  The patient has developed a memory issue over the last year.  The driving issue will need to be scrutinized closely, if he begins having problems with directions or safety issues, this activity will need to be curtailed.  The patient will be placed on low-dose Aricept, if he does well with this after 1 month, he will call and the medication will be increased to the 10 mg tablet as a maintenance dose.  He will follow-up here in 6 months.  Jill Alexanders MD 10/13/2018 8:26 AM  [facility] [address] [place], [place] [address]  Phone [phone] Fax [phone]

## 2018-11-04 ENCOUNTER — Telehealth: Payer: Self-pay | Admitting: Neurology

## 2018-11-04 MED ORDER — DONEPEZIL HCL 10 MG PO TABS: 10.0000 mg | ORAL_TABLET | Freq: Every day | ORAL | 3 refills | Status: DC

## 2018-11-04 NOTE — Addendum Note (Signed)
Addended by: Verlin Grills T on: 11/04/2018 10:32 AM   Modules accepted: Orders

## 2018-11-04 NOTE — Telephone Encounter (Signed)
Per Dr. Jannifer Franklin ok to increase to 10 mg if 5 mg is tolerated will. 10 mg rx of aricept has been submitted to [facility] for 30 day supply with 3 refills

## 2018-11-04 NOTE — Telephone Encounter (Signed)
Wife has called to report that pt has done well on the donepezil (ARICEPT) 5 MG tablet.  So much so that the wife states they would like to move forward with the increase of the 10mg  [place]

## 2018-12-21 ENCOUNTER — Ambulatory Visit (HOSPITAL_COMMUNITY)
Admission: EM | Admit: 2018-12-21 | Discharge: 2018-12-21 | Disposition: A | Discharge status: Home / Self Care | Payer: Medicare Other | Attending: Family Medicine | Admitting: Family Medicine

## 2018-12-21 ENCOUNTER — Other Ambulatory Visit: Payer: Self-pay

## 2018-12-21 ENCOUNTER — Telehealth: Payer: Self-pay

## 2018-12-21 ENCOUNTER — Encounter (HOSPITAL_COMMUNITY): Payer: Self-pay | Admitting: Emergency Medicine

## 2018-12-21 DIAGNOSIS — S60219A Contusion of unspecified wrist, initial encounter: Secondary | ICD-10-CM

## 2018-12-21 NOTE — Telephone Encounter (Signed)
Mrs Conely (DPR signed) said noticed today a bruise on pts forearm that the bruise continues to spread; pt is not aware of any injury or fall. Mrs Boardley said sometimes will fall up against something but this is an extensive bruise that appears black on forearm from elbow to fingers and on both sides of arm. Pt said arm is tender and swelling on back of hand and wrist. Mrs Makris said pt was writing something and the bruise worsened while pt was using his fingers to write with. Pt only takes a baby aspirin daily. Mrs Torti said they will go to [facility] at [facility]. Now. Dr Damita Dunnings is out of office; will send note to Dr Darnell Level.

## 2018-12-21 NOTE — ED Provider Notes (Signed)
[place]    CSN: [id number] Arrival date & time: 12/21/18  1653      History   Chief Complaint Chief Complaint  Patient presents with  . Bleeding/Bruising    HPI Jeffrey [name] is a 85 y.o. male.   HPI  Patient has a bruise on his left arm.  Is progressing as the day goes on.  He does not remember any injury.  He does have memory impairment and is taking Aricept.  He is worried because of the extensiveness of the bruising.  He is worried about a blood clot.  Past Medical History:  Diagnosis Date  . Adenomatous polyp of colon 1995    w/ HGD  . Allergic rhinitis   . BPH (benign prostatic hyperplasia)   . Diverticulosis 09/21/2000  . ED (erectile dysfunction)   . Hyperlipemia 05/05/1988  . Memory loss   . Schamberg's purpura    2012- eval by Dr. Allyson Sabal  . Sebaceous cyst   . Skin cancer 05/05/1998   hx of Scalp-melanoma  . TIA (transient ischemic attack)     Patient Active Problem List   Diagnosis Date Noted  . Cerumen impaction 03/14/2018  . Esophageal stenosis 01/09/2017  . Shoulder pain, right 08/24/2016  . Memory change 01/03/2016  . Left ankle pain 11/05/2015  . Advance care planning 12/29/2013  . Allergic rhinitis 12/23/2012  . Medicare annual wellness visit, subsequent 12/22/2011  . History of melanoma 09/22/2011  . Knee pain 08/13/2011  . Paresthesia of foot 08/13/2011  . ED (erectile dysfunction) 11/14/2010  . Schamberg's purpura 08/01/2010  . EXTERNAL HEMORRHOIDS 05/02/2010  . HYPERTROPHY PROSTATE W/UR OBST & OTH LUTS 10/09/2009  . CONSTIPATION, CHRONIC 01/03/2009  . ELEVATED BLOOD PRESSURE WITHOUT DIAGNOSIS OF HYPERTENSION 10/03/2008  . TRANSIENT ISCHEMIC ATTACK 08/24/2007  . COLONIC POLYPS, HX OF 08/24/2006  . DIVERTICULOSIS, COLON 09/21/2000  . HLD (hyperlipidemia) 05/05/1988    Past Surgical History:  Procedure Laterality Date  . Carotid U/S Nml  09/08/2007  . Colon polyp  2011   B9 Int Hemms  . Colonoscopy polyps  10/05/2003   Divertics in hemms  . COLONOSCOPY W/ POLYPECTOMY  08/1993; 09/21/2000;10/05/2003   B9  . Echo (other)  09/08/2007   nml lvf EF 50-55% Mild Diast Dysfctn Triv Ar  . melanoma scalp  07/2003  . SEPTOPLASTY  1995  . Skin revision vertex of scalp  2009   Dr. Luetta Nutting, [place] Medications    Prior to Admission medications   Medication Sig Start Date End Date Taking? Authorizing Provider  acetaminophen (TYLENOL) 650 MG CR tablet Take 1,300 mg by mouth every 8 (eight) hours as needed for pain.    [provider]  Aloe Vera GEL Apply 1 application topically as needed (rectal pain).     [provider]  aspirin (BAYER ASPIRIN EC LOW DOSE) 81 MG EC tablet Take 81 mg by mouth daily.      [provider]  Coenzyme Q10 (COQ-10) 100 MG CAPS Take 200 mg by mouth daily.     [provider]  donepezil (ARICEPT) 10 MG tablet Take 1 tablet (10 mg total) by mouth at bedtime. 11/04/18   Kathrynn Ducking, MD  loratadine (CLARITIN) 10 MG tablet Take 10 mg by mouth daily.     [provider]  Multiple Vitamins-Minerals (MULTIVITAL) tablet Take 1 tablet by mouth daily.      [provider]  NASAL SPRAY SALINE NA Place 1 spray into both nostrils as needed (Congestion).    [provider]  pantoprazole (PROTONIX) 40 MG tablet TAKE 1 TABLET BY MOUTH ONCE DAILY IN THE MORNING . 02/24/18   Ladene Artist, MD  polyethylene glycol powder (MIRALAX) powder Take 17 g by mouth daily.      [provider]  pravastatin (PRAVACHOL) 40 MG tablet Take 1 tablet (40 mg total) by mouth daily. 02/01/18   Tonia Ghent, MD  simethicone (MYLICON) 902 MG chewable tablet Chew 125 mg by mouth every 6 (six) hours as needed for flatulence.    [provider]    Family History Family History  Problem Relation Age of Onset  . Rectal cancer Father   . Colon cancer Paternal Aunt   . Other Paternal Aunt        cancer on back of neck  .  Prostate cancer Neg Hx     Social History Social History   Tobacco Use  . Smoking status: Former Smoker    Years: 5.00    Types: Cigarettes    Quit date: 05/05/1958    Years since quitting: 60.6  . Smokeless tobacco: Never Used  Substance Use Topics  . Alcohol use: No  . Drug use: No     Allergies   Flonase [fluticasone propionate] and Tamsulosin   Review of Systems Review of Systems  Constitutional: Negative for chills and fever.  HENT: Negative for ear pain and sore throat.   Eyes: Negative for pain and visual disturbance.  Respiratory: Negative for cough and shortness of breath.   Cardiovascular: Negative for chest pain and palpitations.  Gastrointestinal: Negative for abdominal pain and vomiting.  Genitourinary: Negative for dysuria and hematuria.  Musculoskeletal: Negative for arthralgias and back pain.  Skin: Positive for color change. Negative for rash.  Neurological: Negative for seizures and syncope.  All other systems reviewed and are negative.    Physical Exam Triage Vital Signs ED Triage Vitals  Enc Vitals Group     BP 12/21/18 1711 (!) 166/66     Pulse Rate 12/21/18 1711 68     Resp 12/21/18 1711 18     Temp 12/21/18 1711 98.5 F (36.9 C)     Temp Source 12/21/18 1711 Oral     SpO2 12/21/18 1711 97 %     Weight --      Height --      Head Circumference --      Peak Flow --      Pain Score 12/21/18 1712 0     Pain Loc --      Pain Edu? --      Excl. in [place]? --    No data found.  Updated Vital Signs BP (!) 166/66 (BP Location: Right Arm)   Pulse 68   Temp 98.5 F (36.9 C) (Oral)   Resp 18   SpO2 97%      Physical Exam Constitutional:      General: He is not in acute distress.    Appearance: He is well-developed and normal weight.  HENT:     Head: Normocephalic and atraumatic.  Eyes:     Conjunctiva/sclera: Conjunctivae normal.     Pupils: Pupils are equal, round, and reactive to light.  Neck:     Musculoskeletal: Normal range of  motion.  Cardiovascular:     Rate and Rhythm: Normal rate.  Pulmonary:     Effort: Pulmonary effort is normal. No respiratory  distress.  Abdominal:     General: There is no distension.     Palpations: Abdomen is soft.  Musculoskeletal: Normal range of motion.       Arms:  Skin:    General: Skin is warm and dry.  Neurological:     Mental Status: He is alert.      UC Treatments / Results  Labs (all labs ordered are listed, but only abnormal results are displayed) Labs Reviewed - No data to display  EKG   Radiology No results found.  Procedures Procedures (including critical care time)  Medications Ordered in UC Medications - No data to display  Initial Impression / Assessment and Plan / UC Course  I have reviewed the triage vital signs and the nursing notes.  Pertinent labs & imaging results that were available during my care of the patient were reviewed by me and considered in my medical decision making (see chart for details).     Told the patient that he likely broke 1 of the blood muscles in his hands and had some bleeding.  It is dissipated all through the hand and forearm.  Looks terrible but it is absolutely harmless.  Recommend ice.  May put some pressure on the area with a Ace wrap.  Reassured that there was no chance of a blood clot. Final Clinical Impressions(s) / UC Diagnoses   Final diagnoses:  Ecchymosis of wrist     Discharge Instructions     This will resolve without specific treatment Ice will hep reduce pain and swelling   ED Prescriptions    None     Controlled Substance Prescriptions [place] Controlled Substance Registry consulted? Not Applicable   Raylene Everts, MD 12/21/18 2004

## 2018-12-21 NOTE — Discharge Instructions (Addendum)
This will resolve without specific treatment Ice will hep reduce pain and swelling

## 2018-12-21 NOTE — ED Triage Notes (Signed)
Pt here for bruising to left arm with unknown origin; pt denies injury or anticoagulants

## 2018-12-21 NOTE — Telephone Encounter (Signed)
Agreed, thanks.  Please get update on patient.

## 2018-12-21 NOTE — Telephone Encounter (Signed)
Noted. plz call tomorrow for update on bruising and UCC eval.

## 2018-12-22 NOTE — Telephone Encounter (Signed)
Spoke with pt's wife, [name] (on dpr), asking how pt is doing.  Says he a little sore but good.  Told at [facility] pt had small ruptured blood vessel but circulation is good.  So no danger of blood clots.  Pt is applying ice pack every 2 hours.

## 2018-12-23 NOTE — Telephone Encounter (Signed)
Noted. Thanks. I wouldn't do anything else.  Update [place] as needed.

## 2018-12-23 NOTE — Telephone Encounter (Signed)
Noted. Will send to PCP as FYI for documentation.

## 2018-12-23 NOTE — Telephone Encounter (Signed)
Jeffrey [name] called back stating pt hand is doing better he has been icing it and the bruise is fading

## 2018-12-23 NOTE — Telephone Encounter (Signed)
Thanks

## 2018-12-23 NOTE — Telephone Encounter (Signed)
Left message for patient to call back  

## 2019-01-27 ENCOUNTER — Other Ambulatory Visit: Payer: Self-pay | Admitting: Family Medicine

## 2019-01-27 DIAGNOSIS — E785 Hyperlipidemia, unspecified: Secondary | ICD-10-CM

## 2019-01-28 ENCOUNTER — Ambulatory Visit (INDEPENDENT_AMBULATORY_CARE_PROVIDER_SITE_OTHER): Payer: Medicare Other

## 2019-01-28 ENCOUNTER — Ambulatory Visit: Payer: Medicare Other

## 2019-01-28 ENCOUNTER — Other Ambulatory Visit (INDEPENDENT_AMBULATORY_CARE_PROVIDER_SITE_OTHER): Payer: Medicare Other

## 2019-01-28 ENCOUNTER — Other Ambulatory Visit: Payer: Self-pay

## 2019-01-28 DIAGNOSIS — E785 Hyperlipidemia, unspecified: Secondary | ICD-10-CM

## 2019-01-28 DIAGNOSIS — Z0000 Encounter for general adult medical examination without abnormal findings: Secondary | ICD-10-CM | POA: Diagnosis not present

## 2019-01-28 LAB — COMPREHENSIVE METABOLIC PANEL
ALT: 25 U/L (ref 0–53)
AST: 26 U/L (ref 0–37)
Albumin: 4.1 g/dL (ref 3.5–5.2)
Alkaline Phosphatase: 54 U/L (ref 39–117)
BUN: 14 mg/dL (ref 6–23)
CO2: 26 mEq/L (ref 19–32)
Calcium: 9 mg/dL (ref 8.4–10.5)
Chloride: 104 mEq/L (ref 96–112)
Creatinine, Ser: 1.07 mg/dL (ref 0.40–1.50)
GFR: 65.22 mL/min (ref >60.00)
Glucose, Bld: 93 mg/dL (ref 70–99)
Potassium: 4.2 mEq/L (ref 3.5–5.1)
Sodium: 138 mEq/L (ref 135–145)
Total Bilirubin: 0.5 mg/dL (ref 0.2–1.2)
Total Protein: 6.3 g/dL (ref 6.0–8.3)

## 2019-01-28 LAB — LIPID PANEL
Cholesterol: 132 mg/dL (ref 0–200)
HDL: 60 mg/dL (ref >39.00)
LDL Cholesterol: 32 mg/dL (ref 0–99)
NonHDL: 71.82
Total CHOL/HDL Ratio: 2
Triglycerides: 200 mg/dL — ABNORMAL HIGH (ref 0.0–149.0)
VLDL: 40 mg/dL (ref 0.0–40.0)

## 2019-01-28 NOTE — Patient Instructions (Signed)
[name] , Thank you for taking time to come for your Medicare Wellness Visit. I appreciate your ongoing commitment to your health goals. Please review the following plan we discussed and let me know if I can assist you in the future.   Screening recommendations/referrals: Colonoscopy: no longer required Recommended yearly ophthalmology/optometry visit for glaucoma screening and checkup Recommended yearly dental visit for hygiene and checkup  Vaccinations: Influenza vaccine: will get at office visit next week  Pneumococcal vaccine: series completed  Tdap vaccine: declined Shingles vaccine: declined    Advanced directives: Please bring a copy of your POA (Power of Attorney) and/or Living Will to your next appointment.   Conditions/risks identified: hyperlipidemia  Next appointment: 02/01/2019 @ 8:45 am   Preventive Care 65 Years and Older, Male Preventive care refers to lifestyle choices and visits with your health care provider that can promote health and wellness. What does preventive care include?  A yearly physical exam. This is also called an annual well check.  Dental exams once or twice a year.  Routine eye exams. Ask your health care provider how often you should have your eyes checked.  Personal lifestyle choices, including:  Daily care of your teeth and gums.  Regular physical activity.  Eating a healthy diet.  Avoiding tobacco and drug use.  Limiting alcohol use.  Practicing safe sex.  Taking low doses of aspirin every day.  Taking vitamin and mineral supplements as recommended by your health care provider. What happens during an annual well check? The services and screenings done by your health care provider during your annual well check will depend on your age, overall health, lifestyle risk factors, and family history of disease. Counseling  Your health care provider may ask you questions about your:  Alcohol use.  Tobacco use.  Drug use.   Emotional well-being.  Home and relationship well-being.  Sexual activity.  Eating habits.  History of falls.  Memory and ability to understand (cognition).  Work and work Statistician. Screening  You may have the following tests or measurements:  Height, weight, and BMI.  Blood pressure.  Lipid and cholesterol levels. These may be checked every 5 years, or more frequently if you are over 73 years old.  Skin check.  Lung cancer screening. You may have this screening every year starting at age 32 if you have a 30-pack-year history of smoking and currently smoke or have quit within the past 15 years.  Fecal occult blood test (FOBT) of the stool. You may have this test every year starting at age 88.  Flexible sigmoidoscopy or colonoscopy. You may have a sigmoidoscopy every 5 years or a colonoscopy every 10 years starting at age 64.  Prostate cancer screening. Recommendations will vary depending on your family history and other risks.  Hepatitis C blood test.  Hepatitis B blood test.  Sexually transmitted disease (STD) testing.  Diabetes screening. This is done by checking your blood sugar (glucose) after you have not eaten for a while (fasting). You may have this done every 1-3 years.  Abdominal aortic aneurysm (AAA) screening. You may need this if you are a current or former smoker.  Osteoporosis. You may be screened starting at age 35 if you are at high risk. Talk with your health care provider about your test results, treatment options, and if necessary, the need for more tests. Vaccines  Your health care provider may recommend certain vaccines, such as:  Influenza vaccine. This is recommended every year.  Tetanus, diphtheria, and  acellular pertussis (Tdap, Td) vaccine. You may need a Td booster every 10 years.  Zoster vaccine. You may need this after age 75.  Pneumococcal 13-valent conjugate (PCV13) vaccine. One dose is recommended after age 37.  Pneumococcal  polysaccharide (PPSV23) vaccine. One dose is recommended after age 50. Talk to your health care provider about which screenings and vaccines you need and how often you need them. This information is not intended to replace advice given to you by your health care provider. Make sure you discuss any questions you have with your health care provider. Document Released: 05/18/2015 Document Revised: 01/09/2016 Document Reviewed: 02/20/2015 Elsevier Interactive Patient Education  2017 [place] Prevention in the Home Falls can cause injuries. They can happen to people of all ages. There are many things you can do to make your home safe and to help prevent falls. What can I do on the outside of my home?  Regularly fix the edges of walkways and driveways and fix any cracks.  Remove anything that might make you trip as you walk through a door, such as a raised step or threshold.  Trim any bushes or trees on the path to your home.  Use bright outdoor lighting.  Clear any walking paths of anything that might make someone trip, such as rocks or tools.  Regularly check to see if handrails are loose or broken. Make sure that both sides of any steps have handrails.  Any raised decks and porches should have guardrails on the edges.  Have any leaves, snow, or ice cleared regularly.  Use sand or salt on walking paths during winter.  Clean up any spills in your garage right away. This includes oil or grease spills. What can I do in the bathroom?  Use night lights.  Install grab bars by the toilet and in the tub and shower. Do not use towel bars as grab bars.  Use non-skid mats or decals in the tub or shower.  If you need to sit down in the shower, use a plastic, non-slip stool.  Keep the floor dry. Clean up any water that spills on the floor as soon as it happens.  Remove soap buildup in the tub or shower regularly.  Attach bath mats securely with double-sided non-slip rug tape.   Do not have throw rugs and other things on the floor that can make you trip. What can I do in the bedroom?  Use night lights.  Make sure that you have a light by your bed that is easy to reach.  Do not use any sheets or blankets that are too big for your bed. They should not hang down onto the floor.  Have a firm chair that has side arms. You can use this for support while you get dressed.  Do not have throw rugs and other things on the floor that can make you trip. What can I do in the kitchen?  Clean up any spills right away.  Avoid walking on wet floors.  Keep items that you use a lot in easy-to-reach places.  If you need to reach something above you, use a strong step stool that has a grab bar.  Keep electrical cords out of the way.  Do not use floor polish or wax that makes floors slippery. If you must use wax, use non-skid floor wax.  Do not have throw rugs and other things on the floor that can make you trip. What can I do with my stairs?  Do not leave any items on the stairs.  Make sure that there are handrails on both sides of the stairs and use them. Fix handrails that are broken or loose. Make sure that handrails are as long as the stairways.  Check any carpeting to make sure that it is firmly attached to the stairs. Fix any carpet that is loose or worn.  Avoid having throw rugs at the top or bottom of the stairs. If you do have throw rugs, attach them to the floor with carpet tape.  Make sure that you have a light switch at the top of the stairs and the bottom of the stairs. If you do not have them, ask someone to add them for you. What else can I do to help prevent falls?  Wear shoes that:  Do not have high heels.  Have rubber bottoms.  Are comfortable and fit you well.  Are closed at the toe. Do not wear sandals.  If you use a stepladder:  Make sure that it is fully opened. Do not climb a closed stepladder.  Make sure that both sides of the  stepladder are locked into place.  Ask someone to hold it for you, if possible.  Clearly mark and make sure that you can see:  Any grab bars or handrails.  First and last steps.  Where the edge of each step is.  Use tools that help you move around (mobility aids) if they are needed. These include:  Canes.  Walkers.  Scooters.  Crutches.  Turn on the lights when you go into a dark area. Replace any light bulbs as soon as they burn out.  Set up your furniture so you have a clear path. Avoid moving your furniture around.  If any of your floors are uneven, fix them.  If there are any pets around you, be aware of where they are.  Review your medicines with your doctor. Some medicines can make you feel dizzy. This can increase your chance of falling. Ask your doctor what other things that you can do to help prevent falls. This information is not intended to replace advice given to you by your health care provider. Make sure you discuss any questions you have with your health care provider. Document Released: 02/15/2009 Document Revised: 09/27/2015 Document Reviewed: 05/26/2014 Elsevier Interactive Patient Education  2017 [employer].

## 2019-01-28 NOTE — Progress Notes (Signed)
PCP notes: none  Health Maintenance: Patient declined Tdap and Shingrix. Patient will receive the flu vaccine at his physical next week.     Abnormal Screenings: none    Patient concerns: none    Nurse concerns: none    Next PCP appt.: 02/01/2019 @ 8:45 am

## 2019-01-28 NOTE — Progress Notes (Signed)
Subjective:   Jeffrey [name] is a 85 y.o. male who presents for Medicare Annual/Subsequent preventive examination.  Review of Systems:    This visit is being conducted through telemedicine via telephone at the nurse health advisor's home address due to the COVID-19 pandemic. This patient has given me verbal consent via doximity to conduct this visit, patient states they are participating from their home address. Some vital signs may be absent or patient reported.    Patient identification: identified by name, DOB, and current address  Cardiac Risk Factors include: advanced age (>34men, >65 women);dyslipidemia;male gender     Objective:    Vitals: There were no vitals taken for this visit.  There is no height or weight on file to calculate BMI.  Advanced Directives 01/28/2019 01/20/2018 10/26/2016  Does Patient Have a Medical Advance Directive? Yes Yes Yes  Type of Paramedic of [place];Living will [place];Living will -  Copy of [place] in Chart? No - copy requested No - copy requested -    Tobacco Social History   Tobacco Use  Smoking Status Former Smoker  . Years: 5.00  . Types: Cigarettes  . Quit date: 05/05/1958  . Years since quitting: 60.7  Smokeless Tobacco Never Used     Counseling given: Not Answered   Clinical Intake:  Pre-visit preparation completed: Yes  Pain : No/denies pain     Nutritional Risks: None Diabetes: No  How often do you need to have someone help you when you read instructions, pamphlets, or other written materials from your doctor or pharmacy?: 1 - Never What is the last grade level you completed in school?: college degree  Interpreter Needed?: No  Information entered by :: CJohnson, LPN  Past Medical History:  Diagnosis Date  . Adenomatous polyp of colon 1995    w/ HGD  . Allergic rhinitis   . BPH (benign prostatic hyperplasia)   . Diverticulosis 09/21/2000  . ED  (erectile dysfunction)   . Hyperlipemia 05/05/1988  . Memory loss   . Schamberg's purpura    2012- eval by Dr. Allyson Sabal  . Sebaceous cyst   . Skin cancer 05/05/1998   hx of Scalp-melanoma  . TIA (transient ischemic attack)    Past Surgical History:  Procedure Laterality Date  . Carotid U/S Nml  09/08/2007  . Colon polyp  2011   B9 Int Hemms  . Colonoscopy polyps  10/05/2003   Divertics in hemms  . COLONOSCOPY W/ POLYPECTOMY  08/1993; 09/21/2000;10/05/2003   B9  . Echo (other)  09/08/2007   nml lvf EF 50-55% Mild Diast Dysfctn Triv Ar  . melanoma scalp  07/2003  . SEPTOPLASTY  1995  . Skin revision vertex of scalp  2009   Dr. Luetta Nutting, [facility]  . TONSILLECTOMY     Family History  Problem Relation Age of Onset  . Rectal cancer Father   . Colon cancer Paternal Aunt   . Other Paternal Aunt        cancer on back of neck  . Prostate cancer Neg Hx    Social History   Socioeconomic History  . Marital status: Married    Spouse name: Not on file  . Number of children: 1  . Years of education: Not on file  . Highest education level: Bachelor's degree (e.g., BA, AB, BS)  Occupational History  . Occupation: retired-  lab [employer]    Employer: RETIRED  Social Needs  . Financial resource strain:  Not hard at all  . Food insecurity    Worry: Never true    Inability: Never true  . Transportation needs    Medical: No    Non-medical: No  Tobacco Use  . Smoking status: Former Smoker    Years: 5.00    Types: Cigarettes    Quit date: 05/05/1958    Years since quitting: 60.7  . Smokeless tobacco: Never Used  Substance and Sexual Activity  . Alcohol use: No  . Drug use: No  . Sexual activity: Not on file  Lifestyle  . Physical activity    Days per week: 7 days    Minutes per session: 10 min  . Stress: Not at all  Relationships  . Social Herbalist on phone: Not on file    Gets together: Not on file    Attends religious service: Not on file    Active member of club or  organization: Not on file    Attends meetings of clubs or organizations: Not on file    Relationship status: Not on file  Other Topics Concern  . 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    Artist- paints/charcoal   Left handed    Outpatient Encounter Medications as of 01/28/2019  Medication Sig  . acetaminophen (TYLENOL) 650 MG CR tablet Take 1,300 mg by mouth every 8 (eight) hours as needed for pain.  . Aloe Vera GEL Apply 1 application topically as needed (rectal pain).   Marland Kitchen aspirin (BAYER ASPIRIN EC LOW DOSE) 81 MG EC tablet Take 81 mg by mouth daily.    . Coenzyme Q10 (COQ-10) 100 MG CAPS Take 200 mg by mouth daily.   Marland Kitchen donepezil (ARICEPT) 10 MG tablet Take 1 tablet (10 mg total) by mouth at bedtime.  Marland Kitchen loratadine (CLARITIN) 10 MG tablet Take 10 mg by mouth daily.   . Multiple Vitamins-Minerals (MULTIVITAL) tablet Take 1 tablet by mouth daily.    Marland Kitchen NASAL SPRAY SALINE NA Place 1 spray into both nostrils as needed (Congestion).  . pantoprazole (PROTONIX) 40 MG tablet TAKE 1 TABLET BY MOUTH ONCE DAILY IN THE MORNING .  . polyethylene glycol powder (MIRALAX) powder Take 17 g by mouth daily.    . pravastatin (PRAVACHOL) 40 MG tablet Take 1 tablet (40 mg total) by mouth daily.  . simethicone (MYLICON) 0000000 MG chewable tablet Chew 125 mg by mouth every 6 (six) hours as needed for flatulence.   Facility-Administered Encounter Medications as of 01/28/2019  Medication  . 0.9 %  sodium chloride infusion    Activities of Daily Living In your present state of health, do you have any difficulty performing the following activities: 01/28/2019  Hearing? N  Vision? N  Difficulty concentrating or making decisions? Y  Comment some memory loss, currently taking medication for memory loss  Walking or climbing stairs? N  Dressing or  bathing? N  Doing errands, shopping? N  Preparing Food and eating ? N  Using the Toilet? N  In the past six months, have you accidently leaked urine? N  Do you have problems with loss of bowel control? N  Managing your Medications? N  Managing your Finances? N  Housekeeping or managing your Housekeeping? N  Some recent data might be hidden    Patient  Care Team: Tonia Ghent, MD as PCP - General (Family Medicine)   Assessment:   This is a routine wellness examination for Gwyndolyn Saxon.  Exercise Activities and Dietary recommendations Current Exercise Habits: Home exercise routine, Type of exercise: strength training/weights, Time (Minutes): 10, Frequency (Times/Week): 7, Weekly Exercise (Minutes/Week): 70, Intensity: Mild  Goals    . DIET - INCREASE WATER INTAKE     Starting 01/20/2018, I will continue to drink at least 6-8 glasses of water daily.     . Patient Stated     01/28/2019, Patient wants to maintain and continue medications as prescribed.        Fall Risk Fall Risk  01/28/2019 01/20/2018 01/08/2017 01/03/2016 01/01/2015  Falls in the past year? 1 No No Yes Yes  Number falls in past yr: 0 - - - 1  Injury with Fall? 0 - - - No  Risk for fall due to : Medication side effect;Impaired balance/gait - - - -  Follow up Falls evaluation completed;Falls prevention discussed - - - -   Is the patient's home free of loose throw rugs in walkways, pet beds, electrical cords, etc?   yes      Grab bars in the bathroom? no      Handrails on the stairs?   no      Adequate lighting?   yes  Timed Get Up and Go Performed: n/a  Depression Screen PHQ 2/9 Scores 01/28/2019 01/20/2018 01/08/2017 01/03/2016  PHQ - 2 Score 0 0 0 0  PHQ- 9 Score 0 0 - -    Cognitive Function MMSE - Mini Mental State Exam 01/28/2019 10/13/2018 01/20/2018  Not completed: Refused;Unable to complete - -  Orientation to time - 2 5  Orientation to Place - 4 5  Registration - 3 3  Attention/ Calculation - 5 0  Recall -  1 0  Recall-comments - - unable to recall 3 of 3 words  Language- name 2 objects - 2 0  Language- repeat - 0 1  Language- follow 3 step command - 3 2  Language- follow 3 step command-comments - - unable to follow 1 step of 3 step command  Language- read & follow direction - 1 0  Write a sentence - 1 0  Copy design - 1 0  Total score - 23 16  Mini Cog  Mini-Cog screen was not completed. Patient refused to complete test. He takes medication daily for his memory loss. Maximum score is 22. A value of 0 denotes this part of the MMSE was not completed or the patient failed this part of the Mini-Cog screening.      Immunization History  Administered Date(s) Administered  . Influenza Split 02/05/2012  . Influenza,inj,Quad PF,6+ Mos 02/23/2013, 02/07/2015, 01/03/2016, 01/08/2017, 01/20/2018  . Pneumococcal Conjugate-13 01/01/2015  . Pneumococcal Polysaccharide-23 08/24/2006  . Td 08/13/2004  . Zoster 11/14/2010    Qualifies for Shingles Vaccine?  yes  Screening Tests Health Maintenance  Topic Date Due  . INFLUENZA VACCINE  12/04/2018  . TETANUS/TDAP  05/05/2019 (Originally 08/14/2014)  . PNA vac Low Risk Adult  Completed   Cancer Screenings: Lung: Low Dose CT Chest recommended if Age 57-80 years, 30 pack-year currently smoking OR have quit w/in 15years. Patient does not qualify. Colorectal: no longer required  Additional Screenings:  Hepatitis C Screening: n/a      Plan:    Patient wants to maintain and continue medications as prescribed.   I have personally reviewed and noted the following  in the patient's chart:   . Medical and social history . Use of alcohol, tobacco or illicit drugs  . Current medications and supplements . Functional ability and status . Nutritional status . Physical activity . Advanced directives . List of other physicians . Hospitalizations, surgeries, and ER visits in previous 12 months . Vitals . Screenings to include cognitive, depression, and  falls . Referrals and appointments  In addition, I have reviewed and discussed with patient certain preventive protocols, quality metrics, and best practice recommendations. A written personalized care plan for preventive services as well as general preventive health recommendations were provided to patient.     Andrez Grime, LPN  [phone]

## 2019-02-01 ENCOUNTER — Other Ambulatory Visit: Payer: Self-pay

## 2019-02-01 ENCOUNTER — Ambulatory Visit (INDEPENDENT_AMBULATORY_CARE_PROVIDER_SITE_OTHER): Payer: Medicare Other | Admitting: Family Medicine

## 2019-02-01 ENCOUNTER — Encounter: Payer: Self-pay | Admitting: Family Medicine

## 2019-02-01 VITALS — BP 122/60 | HR 97 | Temp 97.6°F | Ht 67.0 in

## 2019-02-01 DIAGNOSIS — Z7189 Other specified counseling: Secondary | ICD-10-CM

## 2019-02-01 DIAGNOSIS — R413 Other amnesia: Secondary | ICD-10-CM

## 2019-02-01 DIAGNOSIS — Z634 Disappearance and death of family member: Secondary | ICD-10-CM

## 2019-02-01 DIAGNOSIS — Z23 Encounter for immunization: Secondary | ICD-10-CM | POA: Diagnosis not present

## 2019-02-01 DIAGNOSIS — R911 Solitary pulmonary nodule: Secondary | ICD-10-CM | POA: Diagnosis not present

## 2019-02-01 DIAGNOSIS — E785 Hyperlipidemia, unspecified: Secondary | ICD-10-CM | POA: Diagnosis not present

## 2019-02-01 MED ORDER — PRAVASTATIN SODIUM 40 MG PO TABS: 40.0000 mg | ORAL_TABLET | Freq: Every day | ORAL | 3 refills | Status: DC
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
## 2019-02-01 NOTE — Patient Instructions (Addendum)
Ask the front about getting papers done so we can call your son if needed.  Don't change your meds for now.  Take care.  Glad to see you.

## 2019-02-03 ENCOUNTER — Telehealth: Payer: Self-pay | Admitting: Family Medicine

## 2019-02-03 DIAGNOSIS — Z634 Disappearance and death of family member: Secondary | ICD-10-CM | POA: Insufficient documentation

## 2019-02-03 DIAGNOSIS — R911 Solitary pulmonary nodule: Secondary | ICD-10-CM

## 2019-02-03 HISTORY — DX: Solitary pulmonary nodule: R91.1

## 2019-02-03 NOTE — Assessment & Plan Note (Signed)
Son designated if patient were incapacitated.    

## 2019-02-03 NOTE — Telephone Encounter (Signed)
Please check with patient's son.  Patient was okay with [place] calling him as needed.  Since the patient's wife has died, he is now living at home alone.  Please check with son about his thoughts on the patient's functional status.  He has a history of memory troubles but he did appear improved with the most recent office visit.  I would like to know what his son has noticed in the meantime.  And please give my condolences about the death of Mrs. Allums.   Thanks.

## 2019-02-03 NOTE — Telephone Encounter (Signed)
Son says that he was stunned to find out that his memory is as bad as it is.  Son states that it was mentioned in passing that he was taking a medication for his memory but when Ms. Cawthorn was alive, they had elected not to say much about it.  Son [name]) says the patient is more functional in the mornings, especially after a good night's sleep but as the day progresses, he becomes more confused and disoriented.  [name] says he is in the process of retiring to be available to help his Dad more and spend more time with him and will be attending his OV's with him and would like to be kept informed of any issues in the meantime.
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
## 2019-02-03 NOTE — Assessment & Plan Note (Signed)
No adverse effect on medication.  Continue as is.  He agrees.  Labs discussed with patient.

## 2019-02-03 NOTE — Assessment & Plan Note (Signed)
On aricept.  Compliant.  He has seen neurology.  No ADE on med.  He has talked with neuro about driving.  No MVAs.   Appears to be improved compared to previous office visits.  We will update neurology and his son.

## 2019-02-03 NOTE — Assessment & Plan Note (Signed)
Patient has a lung nodule. He is a distant former smoker. Reasonable to consider follow-up CAT scan in 03/2019.  D/w pt.

## 2019-02-06 NOTE — Telephone Encounter (Signed)
Duly noted.  Please update his son-the patient has follow-up scheduled for neurology in December.  If his son notices changes in the meantime -especially related to safety at home or driving- then please let neurology know.  Thanks.

## 2019-02-07 NOTE — Telephone Encounter (Signed)
Son advised

## 2019-03-08 ENCOUNTER — Encounter: Payer: Self-pay | Admitting: Family Medicine

## 2019-03-08 ENCOUNTER — Ambulatory Visit (INDEPENDENT_AMBULATORY_CARE_PROVIDER_SITE_OTHER): Payer: Medicare Other | Admitting: Family Medicine

## 2019-03-08 DIAGNOSIS — R0981 Nasal congestion: Secondary | ICD-10-CM | POA: Insufficient documentation

## 2019-03-08 HISTORY — DX: Nasal congestion: R09.81

## 2019-03-08 NOTE — Assessment & Plan Note (Signed)
Difficult history and exam not available. Mentions "swelling up" but also poor drainage. Suspect congestion and told to get OTC decongestant w/o pseudoephedrine. If symptoms do not improve, recommend in office appointment to further evaluate "swelling up" complaint.

## 2019-03-08 NOTE — Progress Notes (Signed)
Virtual Visit via Telephone Note  I connected with Jeffrey [name] on 03/08/19 at 10:40 AM EST by telephone and verified that I am speaking with the correct person using two identifiers.   I discussed the limitations, risks, security and privacy concerns of performing an evaluation and management service by telephone and the availability of in person appointments. I also discussed with the patient that there may be a patient responsible charge related to this service. The patient expressed understanding and agreed to proceed.  Patient location: Home Provider Location: [place] Participants: Lesleigh Noe and Jeffrey [name] and [name] (son)    History of Present Illness: Chief Complaint  Patient presents with  . Sinus Problem    nasal congestion and runny nose, nose feels really sore. symptoms x several weeks. has used nasal spray   Sinus Problem This is a new problem. The current episode started 1 to 4 weeks ago. The problem has been gradually worsening since onset. There has been no fever. Associated symptoms include congestion. Pertinent negatives include no coughing, headaches, shortness of breath, sinus pressure or sore throat. (Sore in the nose) Past treatments include saline sprays (antihistamines). The treatment provided moderate relief.   Nose gets dry and painful Nose swells and gets red  Using saline spray w/o improvement Difficulty breathing  Has not tried decongestants or saline rinse  Review of Systems  HENT: Positive for congestion. Negative for sinus pressure and sore throat.        Rhinorrhea  Respiratory: Negative for cough and shortness of breath.   Neurological: Negative for headaches.      Observations/Objective: There were no vitals taken for this visit.  Unable to check vitals at home  Patient speaking in complete sentences No distress Alert and oriented Normal mood Repetitive during interview. Son provides some details  Son notes that  is nothing he sees  Assessment and Plan: Problem List Items Addressed This Visit      Respiratory   Nasal sinus congestion - Primary    Difficult history and exam not available. Mentions "swelling up" but also poor drainage. Suspect congestion and told to get OTC decongestant w/o pseudoephedrine. If symptoms do not improve, recommend in office appointment to further evaluate "swelling up" complaint.           Follow Up Instructions:  Return if symptoms worsen or fail to improve.   I discussed the assessment and treatment plan with the patient. The patient was provided an opportunity to ask questions and all were answered. The patient agreed with the plan and demonstrated an understanding of the instructions.   The patient was advised to call back or seek an in-person evaluation if the symptoms worsen or if the condition fails to improve as anticipated.  I provided 18 minutes of non-face-to-face time during this encounter. Start 10:35 Stop 10:53  Lesleigh Noe, MD

## 2019-03-09 ENCOUNTER — Telehealth: Payer: Self-pay

## 2019-03-09 NOTE — Telephone Encounter (Signed)
Pt brought in Vicks nasal spray decongestant w/o pseudoephedrine. Pt could not see the instructions on the container. Read instructions on container not to tilt his head back;to use 2 sprays in each nostril every 10 - 12 hrs as needed. Pt had difficulty getting the child proof cap off and the container was glass so to get the mist out pt had to push down on rim of container to release the mist while the nasal tip was in his nose. After several demonstrations and few attempts by pt to get mist out of container; pt placed the nasal tip inside his nose and put 2 sprays in each nostril. Advised pt to get a magnifying glass to help him read the print on the nasal spray box if cannot remember how many sprays to use and how often. Pt's son lives out of town and pt will let his son know if he has problems with administering the nasal spray. Also pt was reminded from virtual visit on 03/08/19 Dr Einar Pheasant said if symptoms do not improve pt should make an in office appt to further eval the "swelling up feeling". Pt voiced understanding and was appreciative for the help and instructions. Nothing further needed at this time. FYI to Dr Einar Pheasant.

## 2019-03-12 ENCOUNTER — Other Ambulatory Visit: Payer: Self-pay | Admitting: Neurology

## 2019-03-13 ENCOUNTER — Other Ambulatory Visit: Payer: Self-pay | Admitting: Family Medicine

## 2019-03-13 DIAGNOSIS — R911 Solitary pulmonary nodule: Secondary | ICD-10-CM

## 2019-03-13 DIAGNOSIS — R918 Other nonspecific abnormal finding of lung field: Secondary | ICD-10-CM

## 2019-03-13 NOTE — Progress Notes (Addendum)
Unable to reach son, no answer, no VM x 2 L. Fuller Plan, [place]  03/18/2019   Spoke with son and he is willing to take the patient for the CAT scan but when they called the patient previously, he cancelled the appointment because he did not remember anything about a lung nodule.  Patient's wife used to take care of all of the medical issues for both of them and she recently passed away.  Patient's memory is rapidly declining and he was thinking that this appointment may have been for his recently deceased wife.  Please get CAT scan reschedlued and call the patient's son to inform of the date and time.[phone] (Tim)  L. Fuller Plan, [place]  03/17/2019    Please check with patient and if needed please check with his son also.  Patient has a history of a lung nodule and would be due for a follow-up CAT scan this month.  Please see if the patient is willing to go through with this.  I went ahead and put in the order in the meantime, just so it would already be in the EMR.  Thanks.  Spoke with son, [name] Corporate investment banker) who said someone had called with an appointment but Mr. Cardella didn't know anything about it and thought it may have been for his wife, who has passed away and so he cancelled the appointment.  Son will call to see if he can reschedule it at a time that he can take him for the appointment and if he is not able to do that, will call back to this office for help.  Mike Craze, CMA  03/13/2019

## 2019-03-14 NOTE — Progress Notes (Signed)
Pt came by office to ask about this. He was wondering when this is a follow up from. He doesn't remember the lung nodule and he states his wife used to take care of all of his appointments. He wants to know if it is something that you think he needs to follow up on.

## 2019-03-16 ENCOUNTER — Other Ambulatory Visit: Payer: Medicare Other

## 2019-03-16 NOTE — Progress Notes (Signed)
See below.  Please check with his son about getting this scheduled.  Thanks.

## 2019-03-18 ENCOUNTER — Other Ambulatory Visit: Payer: Self-pay | Admitting: Gastroenterology

## 2019-03-18 DIAGNOSIS — K219 Gastro-esophageal reflux disease without esophagitis: Secondary | ICD-10-CM

## 2019-03-18 NOTE — Addendum Note (Signed)
Addended by: Tonia Ghent on: 03/18/2019 09:23 AM   Modules accepted: Orders

## 2019-03-18 NOTE — Progress Notes (Signed)
Noted. Thanks.

## 2019-03-18 NOTE — Progress Notes (Signed)
Tim (son) advised to make sure patient follows up with Dr. Jannifer Franklin (neuro).  Patient has appt on 04/14/2019 and son plans to attend this appointment with the patient.  Mike Craze, CMA  03/18/2019

## 2019-03-18 NOTE — Progress Notes (Signed)
Please call his son about getting CT scheduled.  I put in the order.  I think it makes sense from a prognostic stance to find out about the pulmonary nodules.   And I think it makes sense for the son to help the patient get f/u scheduled with Dr. Jannifer Franklin with neuro about memory eval/changes.  Please encourage that.  Thanks.

## 2019-03-20 ENCOUNTER — Encounter (HOSPITAL_COMMUNITY): Payer: Self-pay

## 2019-03-20 ENCOUNTER — Other Ambulatory Visit: Payer: Self-pay

## 2019-03-20 ENCOUNTER — Ambulatory Visit (HOSPITAL_COMMUNITY)
Admission: EM | Admit: 2019-03-20 | Discharge: 2019-03-20 | Disposition: A | Discharge status: Home / Self Care | Payer: Medicare Other | Attending: Family Medicine | Admitting: Family Medicine

## 2019-03-20 DIAGNOSIS — J3489 Other specified disorders of nose and nasal sinuses: Secondary | ICD-10-CM

## 2019-03-20 DIAGNOSIS — J309 Allergic rhinitis, unspecified: Secondary | ICD-10-CM

## 2019-03-20 MED ORDER — MUPIROCIN 2 % EX OINT: 1.0000 "application " | TOPICAL_OINTMENT | Freq: Three times a day (TID) | CUTANEOUS | 1 refills | Status: DC

## 2019-03-20 NOTE — Discharge Instructions (Addendum)
Start mupirocin ointment 3 times daily topically inner nares to decreases nasal dryness.   Schedule an appointment with Dr. Damita Dunnings to follow-up on your current symptoms of nasal dryness if no improvement.  I recommend purchasing humidifier to add moisture to the air to prevent nasal drying.

## 2019-03-20 NOTE — ED Provider Notes (Signed)
[place]    CSN: [id number] Arrival date & time: 03/20/19  1036      History   Chief Complaint Chief Complaint  Patient presents with  . Nasal Congestion    HPI SHLOME GARRIDO is a 85 y.o. male.   HPI  Patient presents with a complaint of nasal congestion x 2 weeks. Patient has a history if allergic rhinitis currently prescribed chronic anti-histamine therapy. Endorses recent increase in heater use related to decrease in temperature which has worsened nares drying. Patient has attempted relief with petroleum jelly and VICKS without significant relief of symptoms. Denies any symptoms of facial pressure, congestion, epistaxis or cough.    Past Medical History:  Diagnosis Date  . Adenomatous polyp of colon 1995    w/ HGD  . Allergic rhinitis   . BPH (benign prostatic hyperplasia)   . Diverticulosis 09/21/2000  . ED (erectile dysfunction)   . Hyperlipemia 05/05/1988  . Memory loss   . Schamberg's purpura    2012- eval by Dr. Allyson Sabal  . Sebaceous cyst   . Skin cancer 05/05/1998   hx of Scalp-melanoma  . TIA (transient ischemic attack)     Patient Active Problem List   Diagnosis Date Noted  . Nasal sinus congestion 03/08/2019  . Lung nodule 02-24-19  . Death of wife 24-Feb-2019  . Esophageal stenosis 01/09/2017  . Shoulder pain, right 08/24/2016  . Memory change 01/03/2016  . Left ankle pain 11/05/2015  . Advance care planning 12/29/2013  . Allergic rhinitis 12/23/2012  . Medicare annual wellness visit, subsequent 12/22/2011  . History of melanoma 09/22/2011  . Knee pain 08/13/2011  . Paresthesia of foot 08/13/2011  . ED (erectile dysfunction) 11/14/2010  . Schamberg's purpura 08/01/2010  . EXTERNAL HEMORRHOIDS 05/02/2010  . HYPERTROPHY PROSTATE W/UR OBST & OTH LUTS 10/09/2009  . CONSTIPATION, CHRONIC 01/03/2009  . ELEVATED BLOOD PRESSURE WITHOUT DIAGNOSIS OF HYPERTENSION 10/03/2008  . TRANSIENT ISCHEMIC ATTACK 08/24/2007  . COLONIC POLYPS, HX OF  08/24/2006  . DIVERTICULOSIS, COLON 09/21/2000  . HLD (hyperlipidemia) 05/05/1988    Past Surgical History:  Procedure Laterality Date  . Carotid U/S Nml  09/08/2007  . Colon polyp  2011   B9 Int Hemms  . Colonoscopy polyps  10/05/2003   Divertics in hemms  . COLONOSCOPY W/ POLYPECTOMY  08/1993; 09/21/2000;10/05/2003   B9  . Echo (other)  09/08/2007   nml lvf EF 50-55% Mild Diast Dysfctn Triv Ar  . melanoma scalp  07/2003  . SEPTOPLASTY  1995  . Skin revision vertex of scalp  2009   Dr. Luetta Nutting, [place] Medications    Prior to Admission medications   Medication Sig Start Date End Date Taking? Authorizing Provider  acetaminophen (TYLENOL) 650 MG CR tablet Take 1,300 mg by mouth every 8 (eight) hours as needed for pain.    [provider]  aspirin (BAYER ASPIRIN EC LOW DOSE) 81 MG EC tablet Take 81 mg by mouth daily.      [provider]  Coenzyme Q10 (COQ-10) 100 MG CAPS Take 200 mg by mouth daily.     [provider]  donepezil (ARICEPT) 10 MG tablet TAKE 1 TABLET BY MOUTH AT BEDTIME 03/14/19   Kathrynn Ducking, MD  loratadine (CLARITIN) 10 MG tablet Take 10 mg by mouth daily.     [provider]  Multiple Vitamins-Minerals (MULTIVITAL) tablet Take 1 tablet by mouth daily.  [provider]  NASAL SPRAY SALINE NA Place 1 spray into both nostrils as needed (Congestion).    [provider]  NON Megan Mans Bond Foot Cream    [provider]  pantoprazole (PROTONIX) 40 MG tablet TAKE 1 TABLET BY MOUTH IN THE MORNING 03/18/19   Ladene Artist, MD  polyethylene glycol powder (MIRALAX) powder Take 17 g by mouth daily.      [provider]  pravastatin (PRAVACHOL) 40 MG tablet Take 1 tablet (40 mg total) by mouth daily. 02/01/19   Tonia Ghent, MD  simethicone (MYLICON) 0000000 MG chewable tablet Chew 125 mg by mouth every 6 (six) hours as needed for flatulence.    [provider]     Family History Family History  Problem Relation Age of Onset  . Rectal cancer Father   . Colon cancer Paternal Aunt   . Other Paternal Aunt        cancer on back of neck  . Prostate cancer Neg Hx     Social History Social History   Tobacco Use  . Smoking status: Former Smoker    Years: 5.00    Types: Cigarettes    Quit date: 05/05/1958    Years since quitting: 60.9  . Smokeless tobacco: Never Used  Substance Use Topics  . Alcohol use: No  . Drug use: No     Allergies   Flonase [fluticasone propionate] and Tamsulosin   Review of Systems Review of Systems Pertinent negatives listed in HPI  Physical Exam Triage Vital Signs ED Triage Vitals  Enc Vitals Group     BP 03/20/19 1132 140/68     Pulse Rate 03/20/19 1132 75     Resp 03/20/19 1132 17     Temp 03/20/19 1132 98.2 F (36.8 C)     Temp Source 03/20/19 1132 Oral     SpO2 03/20/19 1132 100 %     Weight 03/20/19 1135 142 lb (64.4 kg)     Height --      Head Circumference --      Peak Flow --      Pain Score 03/20/19 1135 3     Pain Loc --      Pain Edu? --      Excl. in [place]? --    No data found.  Updated Vital Signs BP 140/68 (BP Location: Right Arm)   Pulse 75   Temp 98.2 F (36.8 C) (Oral)   Resp 17   Wt 142 lb (64.4 kg)   SpO2 100%   BMI 22.24 kg/m   Visual Acuity Right Eye Distance:   Left Eye Distance:   Bilateral Distance:    Right Eye Near:   Left Eye Near:    Bilateral Near:     Physical Exam Constitutional:      General: He is not in acute distress. HENT:     Nose: Congestion present.     Comments: Erythema bilateral nares and enlarged turbinates  Cardiovascular:     Rate and Rhythm: Normal rate.  Pulmonary:     Effort: Pulmonary effort is normal.  Skin:    General: Skin is warm.  Neurological:     Mental Status: He is alert.      UC Treatments / Results  Labs (all labs ordered are listed, but only abnormal results are displayed) Labs Reviewed - No data to  display  EKG   Radiology No results found.  Procedures Procedures (including critical care time)  Medications Ordered in UC Medications - No data to display  Initial Impression / Assessment and Plan / UC Course  I have reviewed the triage vital signs and the nursing notes.  Pertinent labs & imaging results that were available during my care of the patient were reviewed by me and considered in my medical decision making (see chart for details).   Nasal congestion and dryness accompanied by inflammation of bilateral nares.Will trial mupirocin to relieve inner nares irritation and provide lubrication. Recommend purchasing and consist use of humidifier to reduce nasal drying. Final Clinical Impressions(s) / UC Diagnoses   Final diagnoses:  Allergic rhinitis, unspecified seasonality, unspecified trigger  Nasal dryness     Discharge Instructions     Start mupirocin ointment 3 times daily topically inner nares to decreases nasal dryness.   Schedule an appointment with Dr. Damita Dunnings to follow-up on your current symptoms of nasal dryness if no improvement.  I recommend purchasing humidifier to add moisture to the air to prevent nasal drying.       ED Prescriptions    Medication Sig Dispense Auth. Provider   mupirocin ointment (BACTROBAN) 2 % Apply 1 application topically 3 (three) times daily. 22 g Scot Jun, FNP     PDMP not reviewed this encounter.   Scot Jun, FNP 03/21/19 2125

## 2019-03-20 NOTE — ED Triage Notes (Signed)
Pt. States he was having nasal congestion for 2 weeks now, he's congestion has went away but has left him with a sore nose, it hurts and it swollen/tender.

## 2019-03-21 ENCOUNTER — Telehealth: Payer: Self-pay | Admitting: Family Medicine

## 2019-03-21 NOTE — Telephone Encounter (Signed)
Okay to schedule when son would be available.  Okay with me to do virtual visit if that is easier.  Thanks.

## 2019-03-21 NOTE — Telephone Encounter (Signed)
Spoke with son he wanted pt to come in office I schedule for Monday 11/23.  He stated pt wasn't having sinus issues.  He said pt had a sore nose and they gave him cream for this at er.  He stated pt was more concerned about his meds

## 2019-03-21 NOTE — Telephone Encounter (Signed)
Pt called he went to er 11/15 for sinus issues and was told to call and make appointment regarding his meds.  When can I schedule this patient

## 2019-03-22 NOTE — Telephone Encounter (Signed)
Noted. Thanks.

## 2019-03-28 ENCOUNTER — Encounter: Payer: Self-pay | Admitting: Family Medicine

## 2019-03-28 ENCOUNTER — Other Ambulatory Visit: Payer: Self-pay

## 2019-03-28 ENCOUNTER — Ambulatory Visit (INDEPENDENT_AMBULATORY_CARE_PROVIDER_SITE_OTHER): Payer: Medicare Other | Admitting: Family Medicine

## 2019-03-28 DIAGNOSIS — R413 Other amnesia: Secondary | ICD-10-CM

## 2019-03-28 DIAGNOSIS — R911 Solitary pulmonary nodule: Secondary | ICD-10-CM | POA: Diagnosis not present

## 2019-03-28 DIAGNOSIS — L84 Corns and callosities: Secondary | ICD-10-CM | POA: Diagnosis not present

## 2019-03-28 NOTE — Patient Instructions (Signed)
Don't change your meds for now.  Get replacement soft inserts and see if that helps with the foot troubles.  I'll await the CT report and the neurology notes.  Take care.  Glad to see you.

## 2019-03-28 NOTE — Progress Notes (Signed)
This visit occurred during the SARS-CoV-2 public health emergency.  Safety protocols were in place, including screening questions prior to the visit, additional usage of staff PPE, and extensive cleaning of exam room while observing appropriate contact time as indicated for disinfecting solutions.   CT pending. D/w pt. rationale for follow-up discussed with patient and son.  Neuro f/u pending.  Here today with his son.  Memory loss d/w pt and son. Social changes noted- not having a cell phone, prev using a map and not GPS for directions, changes due to covid.  All of this has been a difficult adjustment for the patient.  In addition, his wife died this year.  Condolences offered.  Discussed.  He has had some foot discomfort.  He has a callus at the right first metatarsal head noted.  PMH and SH reviewed  ROS: Per HPI unless specifically indicated in ROS section   Meds, vitals, and allergies reviewed.   GEN: nad, alert and oriented, pleasant in conversation but he repeats himself. HEENT:ncat NECK: supple w/o LA CV: rrr.  PULM: ctab, no inc wob ABD: soft, +bs EXT: no edema SKIN: no acute rash Right foot with a small nonulcerated callus at the distal right first metatarsal.  Based on the wear pattern for his shoe insert, he has a heavy strike at that point when weightbearing.

## 2019-03-29 ENCOUNTER — Ambulatory Visit
Admission: RE | Admit: 2019-03-29 | Discharge: 2019-03-29 | Disposition: A | Discharge status: Home / Self Care | Payer: Medicare Other | Source: Ambulatory Visit | Attending: Family Medicine | Admitting: Family Medicine

## 2019-03-29 DIAGNOSIS — R918 Other nonspecific abnormal finding of lung field: Secondary | ICD-10-CM | POA: Diagnosis not present

## 2019-03-29 DIAGNOSIS — R911 Solitary pulmonary nodule: Secondary | ICD-10-CM

## 2019-03-30 DIAGNOSIS — L84 Corns and callosities: Secondary | ICD-10-CM | POA: Insufficient documentation

## 2019-03-30 NOTE — Assessment & Plan Note (Signed)
He likely has baseline memory loss that is compounded by social changes described above.  He has neurology follow-up pending.  Discussed with his son.  His son is helping get the patient's to appointments, etc.  Patient is clearly okay with information being given to his son.  I will await neurology input.

## 2019-03-30 NOTE — Assessment & Plan Note (Signed)
He can get a soft replacement insert and that should help cushion the area.  Update me as needed.  Patient and son agree.

## 2019-03-30 NOTE — Assessment & Plan Note (Signed)
Follow-up CT pending. >25 minutes spent in face to face time with patient, >50% spent in counselling or coordination of care.

## 2019-04-13 NOTE — Progress Notes (Addendum)
PATIENT: Jeffrey [name] DOB: 09-05-1928  REASON FOR VISIT: follow up HISTORY FROM: patient  HISTORY OF PRESENT ILLNESS: Today 04/14/19  Jeffrey [name] is an 85 year old male with history of memory disturbance. He remains on Aricept 10 mg daily.  Unfortunately, his wife passed away this year.  This year has been a difficult adjustment for him. His last MMSE was 23/30.  He reports he is trying to adjust to his new normal.  He is learning to do a lot of things that his wife used to do, apparently she handled the household affairs, technology, medications, and his overall health.  He is learning to [employer], manage medications, cook, and run their household.  So far, he has done fairly well to adapt to the tasks.  He does drive a car, but only short distances.  He has not gotten lost or had any accident.  Since his wife died, he has complained of a dryness sensation to his feet.  There have been few times that he has had his feet draw up during the night.  The dryness to his feet is relieved by applying Cetaphil lotion.  He says he is not sleeping that well after the loss of his wife. He denies any falls.  He has gotten new inserts for his shoes.  His son lives in [place], has not been that involved with the patient, up until now.  He presents today for evaluation accompanied by her son.  HISTORY 10/13/2018 Jeffrey [name]: Jeffrey [name] is an 85 year old left-handed white male with a history of a memory disturbance that is been present for about 1 year.  The patient comes in with his wife today.  He has noted some short-term memory issues, he will repeat himself at times.  He reports that he will occasionally misplace things about the house.  He has not given up anything in terms of activities daily living because of memory but he claims that his wife has always managed the medications and appointments.  The patient does some of the finances, he does well with this.  He continues to operate a motor  vehicle without difficulty.  He denies any significant issues with getting lost or any safety issues while driving.  The patient denies any significant family history of memory problems.  Both of his parents lived into their 71s.  He denies any numbness or weakness of the extremities or face.  He denies any significant balance issues although he did fall recently in the yard when he tripped over a garden hose.  He is sleeping well at night, he has a good energy level during the day.  He has undergone recent blood work that included an RPR and B12 level that were unremarkable, CT scan of the brain was done showing some cortical atrophy.  The patient is sent to this office for an evaluation.  REVIEW OF SYSTEMS: Out of a complete 14 system review of symptoms, the patient complains only of the following symptoms, and all other reviewed systems are negative.  Memory loss  ALLERGIES: Allergies  Allergen Reactions  . Flonase [Fluticasone Propionate] Other (See Comments)    Nosebleeds.    . Tamsulosin     REACTION: flushed and arms felt stiff    HOME MEDICATIONS: Outpatient Medications Prior to Visit  Medication Sig Dispense Refill  . aspirin (BAYER ASPIRIN EC LOW DOSE) 81 MG EC tablet Take 81 mg by mouth daily.      . Coenzyme Q10 (COQ-10) 100 MG  CAPS Take 200 mg by mouth daily.     Jeffrey [name] donepezil (ARICEPT) 10 MG tablet TAKE 1 TABLET BY MOUTH AT BEDTIME 30 tablet 0  . loratadine (CLARITIN) 10 MG tablet Take 10 mg by mouth daily.     . Multiple Vitamins-Minerals (MULTIVITAL) tablet Take 1 tablet by mouth daily.      Jeffrey [name] NASAL SPRAY SALINE NA Place 1 spray into both nostrils as needed (Congestion).    . NON FORMULARY Gold Bond Foot Cream    . pantoprazole (PROTONIX) 40 MG tablet TAKE 1 TABLET BY MOUTH IN THE MORNING 30 tablet 0  . polyethylene glycol powder (MIRALAX) powder Take 17 g by mouth daily.      . pravastatin (PRAVACHOL) 40 MG tablet Take 1 tablet (40 mg total) by mouth daily. 90 tablet 3  .  simethicone (MYLICON) 0000000 MG chewable tablet Chew 125 mg by mouth every 6 (six) hours as needed for flatulence.    Jeffrey [name] acetaminophen (TYLENOL) 650 MG CR tablet Take 1,300 mg by mouth every 8 (eight) hours as needed for pain.    . Citicoline (COGNITIVE HEALTH) 500 MG CAPS Take by mouth.    . mupirocin ointment (BACTROBAN) 2 % Apply 1 application topically 3 (three) times daily. (Patient not taking: Reported on 04/14/2019) 22 g 1   No facility-administered medications prior to visit.    PAST MEDICAL HISTORY: Past Medical History:  Diagnosis Date  . Adenomatous polyp of colon 1995    w/ HGD  . Allergic rhinitis   . BPH (benign prostatic hyperplasia)   . Diverticulosis 09/21/2000  . ED (erectile dysfunction)   . Hyperlipemia 05/05/1988  . Memory loss   . Schamberg's purpura    2012- eval by Dr. Allyson Sabal  . Sebaceous cyst   . Skin cancer 05/05/1998   hx of Scalp-melanoma  . TIA (transient ischemic attack)     PAST SURGICAL HISTORY: Past Surgical History:  Procedure Laterality Date  . Carotid U/S Nml  09/08/2007  . Colon polyp  2011   B9 Int Hemms  . Colonoscopy polyps  10/05/2003   Divertics in hemms  . COLONOSCOPY W/ POLYPECTOMY  08/1993; 09/21/2000;10/05/2003   B9  . Echo (other)  09/08/2007   nml lvf EF 50-55% Mild Diast Dysfctn Triv Ar  . melanoma scalp  07/2003  . SEPTOPLASTY  1995  . Skin revision vertex of scalp  2009   Dr. Luetta Nutting, [facility]  . TONSILLECTOMY      FAMILY HISTORY: Family History  Problem Relation Age of Onset  . Rectal cancer Father   . Colon cancer Paternal Aunt   . Other Paternal Aunt        cancer on back of neck  . Prostate cancer Neg Hx     SOCIAL HISTORY: Social History   Socioeconomic History  . Marital status: Married    Spouse name: Not on file  . Number of children: 1  . Years of education: Not on file  . Highest education level: Bachelor's degree (e.g., BA, AB, BS)  Occupational History  . Occupation: retired-  lab [employer]    Employer:  RETIRED  Tobacco Use  . Smoking status: Former Smoker    Years: 5.00    Types: Cigarettes    Quit date: 05/05/1958    Years since quitting: 60.9  . Smokeless tobacco: Never Used  Substance and Sexual Activity  . Alcohol use: No  . Drug use: No  . Sexual activity: Not on file  Other Topics  Concern  . 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    Artist- paints/charcoal   Left handed   Social Determinants of Health   Financial Resource Strain: Low Risk   . Difficulty of Paying Living Expenses: Not hard at all  Food Insecurity: No Food Insecurity  . Worried About Charity fundraiser in the Last Year: Never true  . Ran Out of Food in the Last Year: Never true  Transportation Needs: No Transportation Needs  . Lack of Transportation (Medical): No  . Lack of Transportation (Non-Medical): No  Physical Activity: Insufficiently Active  . Days of Exercise per Week: 7 days  . Minutes of Exercise per Session: 10 min  Stress: No Stress Concern Present  . Feeling of Stress : Not at all  Social Connections:   . Frequency of Communication with Friends and Family: Not on file  . Frequency of Social Gatherings with Friends and Family: Not on file  . Attends Religious Services: Not on file  . Active Member of Clubs or Organizations: Not on file  . Attends Archivist Meetings: Not on file  . Marital Status: Not on file  Intimate Partner Violence: Not At Risk  . Fear of Current or Ex-Partner: No  . Emotionally Abused: No  . Physically Abused: No  . Sexually Abused: No   PHYSICAL EXAM  Vitals:   04/14/19 1040  BP: (!) 154/70  Pulse: 68  Temp: (!) 97.5 F (36.4 C)  Weight: 142 lb 9.6 oz (64.7 kg)  Height: 5\' 8"  (1.727 m)   Body mass index is 21.68 kg/m.  Generalized:  Well developed, in no acute distress  MMSE - Mini Mental State Exam 04/14/2019 01/28/2019 10/13/2018  Not completed: - Refused;Unable to complete -  Orientation to time 2 - 2  Orientation to Place 4 - 4  Registration 3 - 3  Attention/ Calculation 1 - 5  Recall 1 - 1  Recall-comments - - -  Language- name 2 objects 2 - 2  Language- repeat 1 - 0  Language- follow 3 step command 3 - 3  Language- follow 3 step command-comments - - -  Language- read & follow direction 1 - 1  Write a sentence 1 - 1  Copy design 1 - 1  Total score 20 - 23    Neurological examination  Mentation: Alert oriented to time, place, history taking. Follows all commands speech and language fluent, repetitive questioning  Cranial nerve II-XII: Pupils were equal round reactive to light. Extraocular movements were full, visual field were full on confrontational test. Facial sensation and strength were normal.  Head turning and shoulder shrug  were normal and symmetric. Motor: The motor testing reveals 5 over 5 strength of all 4 extremities. Good symmetric motor tone is noted throughout.  Sensory: Sensory testing is intact to soft touch on all 4 extremities, pinprick, vibration sensation intact to LE. No evidence of extinction is noted.  Coordination: Cerebellar testing reveals good finger-nose-finger and heel-to-shin bilaterally.  Gait and station: Gait is normal. Reflexes: Deep tendon reflexes are symmetric and normal bilaterally.   DIAGNOSTIC DATA (LABS, IMAGING, TESTING) - I reviewed patient records, labs, notes, testing and imaging myself where available.  Lab Results  Component Value Date  WBC 9.7 02/01/2018   HGB 15.5 02/01/2018   HCT 44.2 02/01/2018   MCV 91.9 02/01/2018   PLT 177.0 02/01/2018      Component Value Date/Time   NA 138 01/28/2019 0809   K 4.2 01/28/2019 0809   CL 104 01/28/2019 0809   CO2 26 01/28/2019 0809   GLUCOSE 93 01/28/2019 0809   BUN 14 01/28/2019 0809   CREATININE 1.07  01/28/2019 0809   CALCIUM 9.0 01/28/2019 0809   PROT 6.3 01/28/2019 0809   ALBUMIN 4.1 01/28/2019 0809   AST 26 01/28/2019 0809   ALT 25 01/28/2019 0809   ALKPHOS 54 01/28/2019 0809   BILITOT 0.5 01/28/2019 0809   GFRNONAA >60 01/04/2011 1621   GFRAA >60 01/04/2011 1621   Lab Results  Component Value Date   CHOL 132 01/28/2019   HDL 60.00 01/28/2019   LDLCALC 32 01/28/2019   LDLDIRECT 61.0 01/20/2018   TRIG 200.0 (H) 01/28/2019   CHOLHDL 2 01/28/2019   No results found for: HGBA1C Lab Results  Component Value Date   VITAMINB12 565 02/01/2018   Lab Results  Component Value Date   TSH 1.81 02/01/2018   ASSESSMENT AND PLAN 85 y.o. year old male  has a past medical history of Adenomatous polyp of colon (1995 ), Allergic rhinitis, BPH (benign prostatic hyperplasia), Diverticulosis (09/21/2000), ED (erectile dysfunction), Hyperlipemia (05/05/1988), Memory loss, Schamberg's purpura, Sebaceous cyst, Skin cancer (05/05/1998), and TIA (transient ischemic attack). here with:  1.  Memory loss 2.  Paresthesia  We spent a great deal of time talking about the sudden loss of his wife in October, and how this has affected him.  He is now having to learn how to do all the tasks his wife used to do in regards to managing his household, affairs, medications, and overall health.  Of course, this has been quite a change to him and has affected him greatly.  He has had some decline in his memory score, 20/30 today.  He will remain on Aricept, I will add Namenda titration.  We have discussed the likely need for his son to become more involved, and closely monitor the patient's driving.  His son is getting up to speed on his father's health condition, as the patient's wife managed this up until recently.  He complains of a dryness sensation to his feet at night, this may represent a neuropathy, however unsure how long this has been going on.  The sensation is relieved by application of Cetaphil, he will  continue doing this, continue with his new shoe inserts.  I will see the patient back in 3 months for further evaluation.  We may consider low-dose gabapentin at bedtime or EMG nerve conduction evaluation.  There is no report of pain, only sensation of dryness. Vitamin B12, RPR, in 2019 were normal.  I did advise if his symptoms worsen or if he develops any new symptoms he should let us know.  I spent 25 minutes with the patient. 50% of this time was spent discussing his plan of care.  Butler Denmark, AGNP-C, DNP 04/14/2019, 10:52 AM [facility] [address], [place], [place] [address] [phone]  I have read the note, and I agree with the clinical assessment and plan.  Kathrynn Ducking

## 2019-04-14 ENCOUNTER — Encounter: Payer: Self-pay | Admitting: Neurology

## 2019-04-14 ENCOUNTER — Other Ambulatory Visit: Payer: Self-pay

## 2019-04-14 ENCOUNTER — Ambulatory Visit (INDEPENDENT_AMBULATORY_CARE_PROVIDER_SITE_OTHER): Payer: Medicare Other | Admitting: Neurology

## 2019-04-14 VITALS — BP 154/70 | HR 68 | Temp 97.5°F | Ht 68.0 in | Wt 142.6 lb

## 2019-04-14 DIAGNOSIS — R413 Other amnesia: Secondary | ICD-10-CM

## 2019-04-14 DIAGNOSIS — R202 Paresthesia of skin: Secondary | ICD-10-CM

## 2019-04-14 MED ORDER — DONEPEZIL HCL 10 MG PO TABS: 10.0000 mg | ORAL_TABLET | Freq: Every day | ORAL | 1 refills | Status: DC

## 2019-04-14 MED ORDER — MEMANTINE HCL 5 MG PO TABS: ORAL_TABLET | ORAL | 0 refills | Status: DC

## 2019-04-14 NOTE — Patient Instructions (Signed)
Continue Aricept, Start Namenda    Take 1 tablet daily for one week, then take 1 tablet twice daily for one week, then take 1 tablet in the morning and 2 in the evening for one week, then take 2 tablets twice daily  Once you complete the namenda titration, let me know and we will increase the prescription to 10 mg twice a day  Return in 3 months or sooner if needed  Keep follow-up with PCP

## 2019-04-18 ENCOUNTER — Other Ambulatory Visit: Payer: Self-pay | Admitting: Gastroenterology

## 2019-04-18 ENCOUNTER — Telehealth: Payer: Self-pay | Admitting: Gastroenterology

## 2019-04-18 DIAGNOSIS — K219 Gastro-esophageal reflux disease without esophagitis: Secondary | ICD-10-CM

## 2019-04-18 MED ORDER — PANTOPRAZOLE SODIUM 40 MG PO TBEC: DELAYED_RELEASE_TABLET | ORAL | 1 refills | Status: DC

## 2019-04-18 NOTE — Telephone Encounter (Signed)
Prescription refilled until upcoming appt.

## 2019-04-24 ENCOUNTER — Telehealth: Payer: Self-pay | Admitting: Family Medicine

## 2019-04-24 DIAGNOSIS — E041 Nontoxic single thyroid nodule: Secondary | ICD-10-CM

## 2019-04-24 NOTE — Telephone Encounter (Signed)
Please check with patient/son.  We will likely need input from his son.  He also has a 2.6 cm thyroid nodule incidentally noted on previous CT. It would be reasonable to consider/recommended to get a follow-up thyroid ultrasound to further evaluate this.  This is assuming the patient would be able to tolerate going for the ultrasound and given all considerations regarding the Covid pandemic.  I wanted to wait until the patient got through the neurology evaluation.  Does the patient/does the son think he would be able to go for this?  Please let me know.  Thanks.

## 2019-04-25 NOTE — Telephone Encounter (Signed)
Son advised and thinks patient would be willing and able to have the ultrasound.  Please order and be sure the referral coordinators know to call the son for scheduling.

## 2019-04-26 NOTE — Addendum Note (Signed)
Addended by: Tonia Ghent on: 04/26/2019 06:56 AM   Modules accepted: Orders

## 2019-04-26 NOTE — Telephone Encounter (Signed)
Ordered.  Thanks. Routed to [facility] as FYI.

## 2019-05-11 ENCOUNTER — Encounter: Payer: Self-pay | Admitting: Family Medicine

## 2019-05-11 ENCOUNTER — Other Ambulatory Visit: Payer: Self-pay

## 2019-05-11 ENCOUNTER — Telehealth: Payer: Self-pay | Admitting: Gastroenterology

## 2019-05-11 ENCOUNTER — Ambulatory Visit (INDEPENDENT_AMBULATORY_CARE_PROVIDER_SITE_OTHER): Payer: Medicare Other | Admitting: Family Medicine

## 2019-05-11 ENCOUNTER — Ambulatory Visit (INDEPENDENT_AMBULATORY_CARE_PROVIDER_SITE_OTHER)
Admission: RE | Admit: 2019-05-11 | Discharge: 2019-05-11 | Disposition: A | Discharge status: Home / Self Care | Payer: Medicare Other | Source: Ambulatory Visit | Attending: Family Medicine | Admitting: Family Medicine

## 2019-05-11 VITALS — BP 126/70 | HR 71 | Temp 97.1°F | Wt 146.0 lb

## 2019-05-11 DIAGNOSIS — K5900 Constipation, unspecified: Secondary | ICD-10-CM

## 2019-05-11 DIAGNOSIS — K5641 Fecal impaction: Secondary | ICD-10-CM | POA: Diagnosis not present

## 2019-05-11 LAB — POC URINALSYSI DIPSTICK (AUTOMATED)
Bilirubin, UA: NEGATIVE
Blood, UA: NEGATIVE
Glucose, UA: NEGATIVE
Ketones, UA: NEGATIVE
Leukocytes, UA: NEGATIVE
Nitrite, UA: NEGATIVE
Protein, UA: NEGATIVE
Spec Grav, UA: 1.015 (ref 1.010–1.025)
Urobilinogen, UA: 0.2 E.U./dL
pH, UA: 7 (ref 5.0–8.0)

## 2019-05-11 MED ORDER — POLYETHYLENE GLYCOL 3350 17 GM/SCOOP PO POWD: 17.0000 g | Freq: Every day | ORAL | 1 refills | Status: DC

## 2019-05-11 MED ORDER — DOCUSATE SODIUM 100 MG PO CAPS: 100.0000 mg | ORAL_CAPSULE | Freq: Two times a day (BID) | ORAL | 3 refills | Status: DC

## 2019-05-11 NOTE — Progress Notes (Addendum)
This visit was conducted in person.  BP 126/70 (BP Location: Left Arm, Patient Position: Sitting, Cuff Size: Normal)   Pulse 71   Temp (!) 97.1 F (36.2 C) (Temporal)   Wt 146 lb (66.2 kg)   SpO2 98%   BMI 22.20 kg/m    CC: constipation Subjective:    Patient ID: Jeffrey [name], male    DOB: [birth year], 86 y.o.   MRN: [id number]  HPI: Jeffrey [name] is a 86 y.o. male presenting on 05/11/2019 for Constipation and Urinary Incontinence   Increasing constipation noted over the last few months. Has noted stool getting harder/drier, increased straining. Now over the past 1+ week having more trouble passing stool, associated with some difficulty passing gas. Some urinary incontinence.   No abd pain, no fevers, no weight loss (actually up 4 lbs since last visit). No nausea/vomiting. Last BM was yesterday.  He's tried psyllium husks "colon cleanser" 2-3 times a day with juice.  Tried laxatives as well.   Has miralax listed on med list but not currently taking.   Wife passed away end of 01-11-19. Has struggled adjusting to this, diet change as well - less yogurts, salads, milk/cereal.  Son has started helping him out more (lives in [place]).   Current diet:  breakfast - boiled eggs, cheese crackers, banana and water  Lunch - with son - chicken sandwich, sweet potatoes, veggies, or [employer] fish sandwich  Dinner - similar to lunch  Feels he drinks plenty of water.   H/o dementia on aricept, namenda (started 04/2019).     Relevant past medical, surgical, family and social history reviewed and updated as indicated. Interim medical history since our last visit reviewed. Allergies and medications reviewed and updated. Outpatient Medications Prior to Visit  Medication Sig Dispense Refill  . acetaminophen (TYLENOL) 650 MG CR tablet Take 1,300 mg by mouth every 8 (eight) hours as needed for pain.    Marland Kitchen aspirin (BAYER ASPIRIN EC LOW DOSE) 81 MG EC tablet Take 81 mg by mouth daily.      .  Citicoline (COGNITIVE HEALTH) 500 MG CAPS Take by mouth.    . Coenzyme Q10 (COQ-10) 100 MG CAPS Take 200 mg by mouth daily.     Marland Kitchen donepezil (ARICEPT) 10 MG tablet Take 1 tablet (10 mg total) by mouth at bedtime. 90 tablet 1  . loratadine (CLARITIN) 10 MG tablet Take 10 mg by mouth daily.     . memantine (NAMENDA) 5 MG tablet Take 1 tablet daily for one week, then take 1 tablet twice daily for one week, then take 1 tablet in the morning and 2 in the evening for one week, then take 2 tablets twice daily 120 tablet 0  . Multiple Vitamins-Minerals (MULTIVITAL) tablet Take 1 tablet by mouth daily.      . mupirocin ointment (BACTROBAN) 2 % Apply 1 application topically 3 (three) times daily. 22 g 1  . NASAL SPRAY SALINE NA Place 1 spray into both nostrils as needed (Congestion).    . NON FORMULARY Gold Bond Foot Cream    . pantoprazole (PROTONIX) 40 MG tablet TAKE 1 TABLET BY MOUTH IN THE MORNING 30 tablet 1  . pravastatin (PRAVACHOL) 40 MG tablet Take 1 tablet (40 mg total) by mouth daily. 90 tablet 3  . simethicone (MYLICON) 0000000 MG chewable tablet Chew 125 mg by mouth every 6 (six) hours as needed for flatulence.    . polyethylene glycol powder (MIRALAX) powder Take 17 g by mouth  daily.       No facility-administered medications prior to visit.     Per HPI unless specifically indicated in ROS section below Review of Systems Objective:    BP 126/70 (BP Location: Left Arm, Patient Position: Sitting, Cuff Size: Normal)   Pulse 71   Temp (!) 97.1 F (36.2 C) (Temporal)   Wt 146 lb (66.2 kg)   SpO2 98%   BMI 22.20 kg/m   Wt Readings from Last 3 Encounters:  05/11/19 146 lb (66.2 kg)  04/14/19 142 lb 9.6 oz (64.7 kg)  03/28/19 142 lb 9 oz (64.7 kg)    Physical Exam Vitals and nursing note reviewed.  Constitutional:      Appearance: Normal appearance. He is not ill-appearing.  Cardiovascular:     Rate and Rhythm: Normal rate and regular rhythm.     Pulses: Normal pulses.     Heart  sounds: Normal heart sounds. No murmur.  Pulmonary:     Effort: Pulmonary effort is normal. No respiratory distress.     Breath sounds: Normal breath sounds. No wheezing, rhonchi or rales.  Abdominal:     General: Abdomen is flat. Bowel sounds are normal. There is no distension.     Palpations: Abdomen is soft. There is no mass.     Tenderness: There is abdominal tenderness (mild) in the right lower quadrant and left lower quadrant. There is no right CVA tenderness, left CVA tenderness, guarding or rebound. Negative signs include Murphy's sign.     Hernia: No hernia is present.     Comments: Some palpable stool bilateral lower quadrants  Neurological:     Mental Status: He is alert.  Psychiatric:        Mood and Affect: Mood normal.        Behavior: Behavior normal.       Results for orders placed or performed in visit on 05/11/19  POCT Urinalysis Dipstick (Automated)  Result Value Ref Range   Color, UA Yellow    Clarity, UA Clear    Glucose, UA Negative Negative   Bilirubin, UA Neg    Ketones, UA Neg    Spec Grav, UA 1.015 1.010 - 1.025   Blood, UA Neg    pH, UA 7.0 5.0 - 8.0   Protein, UA Negative Negative   Urobilinogen, UA 0.2 0.2 or 1.0 E.U./dL   Nitrite, UA Neg    Leukocytes, UA Negative Negative   Lab Results  Component Value Date   TSH 1.81 02/01/2018    Assessment & Plan:  This visit occurred during the SARS-CoV-2 public health emergency.  Safety protocols were in place, including screening questions prior to the visit, additional usage of staff PPE, and extensive cleaning of exam room while observing appropriate contact time as indicated for disinfecting solutions.   Problem List Items Addressed This Visit    Fecal impaction in rectum ([place]) - Primary    Acute episode, progressively worsening, likely related to recent poor diet changes since wife's death. Reviewed importance of healthy high fiber diet once feeling better, continue good water intake.  Anticipate  psyllium husk may have worsened discomfort so advised stop this.  No red flags, but given no BM today and poorly passing gas, will check 2 view abd to r/o obstruction.  Disimpaction performed today, pt tolerated well.  If normal, rec start miralax 17gm daily or BID + colace stool softener daily. Red flags to seek further care reviewed. Pt agrees with plan.  Relevant Orders   POCT Urinalysis Dipstick (Automated) (Completed)       Meds ordered this encounter  Medications  . docusate sodium (COLACE) 100 MG capsule    Sig: Take 1 capsule (100 mg total) by mouth 2 (two) times daily.    Dispense:  60 capsule    Refill:  3  . polyethylene glycol powder (MIRALAX) 17 GM/SCOOP powder    Sig: Take 17 g by mouth daily.    Dispense:  850 g    Refill:  1   Orders Placed This Encounter  Procedures  . DG Abd 2 Views    Standing Status:   Future    Number of Occurrences:   1    Standing Expiration Date:   07/08/2020    Order Specific Question:   Reason for Exam (SYMPTOM  OR DIAGNOSIS REQUIRED)    Answer:   severe constipation    Order Specific Question:   Preferred imaging location?    Answer:   [facility]    Order Specific Question:   Radiology Contrast Protocol - do NOT remove file path    Answer:   [email or web address]  . POCT Urinalysis Dipstick (Automated)    Patient instructions: I think you have bad case of constipation, likely due to recent changes in diet, leading to fecal impaction. Some stool removed today. See handout below.  Xray today to rule out obstruction - looking ok.  Urine looked ok today.  Stop psyllium husk. Start colace 100mg  once daily (stool softener).  Start miralax 1 capful once daily - if no benefit, may increase to 1 capful twice daily.  Continue good water intake. Increase fruits/vegetables in the diet.   Follow up plan: No follow-ups on file.  Ria Bush, MD

## 2019-05-11 NOTE — Telephone Encounter (Signed)
I reviewed the instructions that Dr. Chryl Heck provided to him at the office visit he had with him today.  He seemed confused as to who Dr. Fuller Plan was.  I explained that we are from GI and that if he has continued problems with constipation he will need to call back here for an office visit.

## 2019-05-11 NOTE — Assessment & Plan Note (Addendum)
Acute episode, progressively worsening, likely related to recent poor diet changes since wife's death. Reviewed importance of healthy high fiber diet once feeling better, continue good water intake.  Anticipate psyllium husk may have worsened discomfort so advised stop this.  No red flags, but given no BM today and poorly passing gas, will check 2 view abd to r/o obstruction.  Disimpaction performed today, pt tolerated well.  If normal, rec start miralax 17gm daily or BID + colace stool softener daily. Red flags to seek further care reviewed. Pt agrees with plan.

## 2019-05-11 NOTE — Addendum Note (Signed)
Addended by: Sandria Bales B on: 05/11/2019 05:23 PM   Modules accepted: Orders

## 2019-05-11 NOTE — Patient Instructions (Addendum)
I think you have bad case of constipation, likely due to recent changes in diet, leading to fecal impaction. Some stool removed today. See handout below.  Xray today to rule out obstruction - looking ok.  Urine looked ok today.  Stop psyllium husk. Start colace 100mg  once daily (stool softener).  Start miralax 1 capful once daily - if no benefit, may increase to 1 capful twice daily.  Continue good water intake. Increase fruits/vegetables in the diet.   Fecal Impaction  A fecal impaction is a large, firm amount of stool (feces) that will not pass out of the body. A fecal impaction usually happens in the rectum, which is in the end of the large intestine. A fecal impaction can block the large intestine and cause significant problems. What are the causes? This condition may be caused by anything that slows down bowel movements, including:  The long-term use of laxatives, which are medicines that help you have a bowel movement.  Constipation.  Pain in the rectum. Fecal impaction can occur if you avoid having bowel movements due to the pain. Pain in the rectum can result from a medical condition, such as hemorrhoids or anal fissures.  Narcotic pain-relieving medicines, such as methadone, morphine, or codeine.  Not drinking enough fluids.  Being inactive for a long period of time.  Diseases of the brain or nervous system that damage nerves that control the muscles of the intestines. This condition may also be caused by dementia. What are the signs or symptoms? Common symptoms of this condition include:  A sense of fullness in the rectum but being unable to pass stool.  Changes in bowel patterns. This may include going to the bathroom less often or not at all.  Not having a normal number of bowel movements.  Thin, watery discharge from the rectum.  Pain or cramps in the abdominal area. These often happen after meals. Other symptoms include:  Urinating often.  Nausea, vomiting,  and dehydration.  Dizziness and confusion.  Rapid heartbeat.  Fever and sweating.  Changes in blood pressure.  Breathing problems. How is this diagnosed? This condition may be diagnosed based on your symptoms and an exam of your rectum. Sometimes, X-rays or lab tests are done to confirm the diagnosis and to check for other problems. How is this treated? This condition may be treated by:  Having your health care provider remove the stool using a gloved finger.  Taking medicine.  Using a suppository or enema in the rectum to soften the stool, which can stimulate a bowel movement. Follow these instructions at home: Eating and drinking   Drink enough fluid to keep your urine pale yellow.  Eat foods that are high in fiber, such as beans, whole grains, and fresh fruits and vegetables.  If you begin to get constipated, increase the amount of fiber in your diet. General instructions  Develop bowel habits. An example of a bowel habit is having a bowel movement right after breakfast every day. Be sure to give yourself enough time on the toilet. This may require using enemas, bowel softeners, or suppositories at home, as directed by your health care provider. It may also include using mineral oil or olive oil.  Do exercises as told by your health care provider.  Take over-the-counter and prescription medicines only as told by your health care provider.  Keep all follow-up visits as told by your health care provider. This is important. Contact a health care provider if you:  Have ongoing pain  in your rectum.  Need to use an enema or a suppository more than 2 times a week.  Have rectal bleeding.  Continue to have problems. The problems may include not being able to go to the bathroom and having long-term constipation.  Have pain in your abdomen.  Have thin, pencil-like stools. Get help right away if:  You have black or tarry stools. Summary  A fecal impaction is a large,  firm amount of stool (feces) that will not pass out of the body. A fecal impaction can block the large intestine and cause significant problems.  This condition may be caused by anything that slows down bowel movements.  This condition may be treated by having your health care provider remove the stool using a gloved finger, taking medicine, or using a suppository or enema in the rectum to soften the stool.  Develop bowel habits and eat foods that are high in fiber, such as beans, whole grains, and fresh fruits and vegetables.  Contact a health care provider if you continue to have problems. The problems may include not being able to go to the bathroom and having long-term constipation. This information is not intended to replace advice given to you by your health care provider. Make sure you discuss any questions you have with your health care provider. Document Revised: 10/19/2018 Document Reviewed: 10/19/2018 Elsevier Patient Education  [place].

## 2019-05-11 NOTE — Telephone Encounter (Signed)
Pt reported that he has regained appetite since his wife passed away but is experiencing constipation.  Please advise.

## 2019-05-12 ENCOUNTER — Ambulatory Visit
Admission: RE | Admit: 2019-05-12 | Discharge: 2019-05-12 | Disposition: A | Discharge status: Home / Self Care | Payer: Medicare Other | Source: Ambulatory Visit | Attending: Family Medicine | Admitting: Family Medicine

## 2019-05-12 DIAGNOSIS — E042 Nontoxic multinodular goiter: Secondary | ICD-10-CM | POA: Diagnosis not present

## 2019-05-12 DIAGNOSIS — E041 Nontoxic single thyroid nodule: Secondary | ICD-10-CM

## 2019-05-25 ENCOUNTER — Telehealth: Payer: Self-pay | Admitting: Neurology

## 2019-05-25 MED ORDER — MEMANTINE HCL 10 MG PO TABS: 10.0000 mg | ORAL_TABLET | Freq: Two times a day (BID) | ORAL | 10 refills | Status: DC

## 2019-05-25 NOTE — Telephone Encounter (Signed)
memantine 10mg  po BID #[id number] RF sent to [facility].

## 2019-05-25 NOTE — Telephone Encounter (Signed)
Patient has completed the 5mg  titration of Namenda and is requesting the Namenda 10mg  BID to be sent to the [facility], [place].

## 2019-05-31 ENCOUNTER — Other Ambulatory Visit: Payer: Self-pay

## 2019-05-31 ENCOUNTER — Encounter: Payer: Self-pay | Admitting: Gastroenterology

## 2019-05-31 ENCOUNTER — Ambulatory Visit (INDEPENDENT_AMBULATORY_CARE_PROVIDER_SITE_OTHER): Payer: Medicare Other | Admitting: Gastroenterology

## 2019-05-31 VITALS — BP 138/66 | HR 60 | Temp 98.5°F | Ht 68.0 in | Wt 143.2 lb

## 2019-05-31 DIAGNOSIS — K219 Gastro-esophageal reflux disease without esophagitis: Secondary | ICD-10-CM | POA: Diagnosis not present

## 2019-05-31 DIAGNOSIS — K5904 Chronic idiopathic constipation: Secondary | ICD-10-CM

## 2019-05-31 MED ORDER — PANTOPRAZOLE SODIUM 40 MG PO TBEC: DELAYED_RELEASE_TABLET | ORAL | 11 refills | Status: DC

## 2019-05-31 NOTE — Progress Notes (Signed)
    History of Present Illness: This is an 86 year old male GERD with a history of esophageal stricture and constipation.  States his constipation responds well to his current regimen of MiraLAX and Colace.  His reflux symptoms are under very good control.  He denies dysphagia.  He relates his wife passed away in 31-Dec-2022 and that has been a very difficult few months for him.  Current Medications, Allergies, Past Medical History, Past Surgical History, Family History and Social History were reviewed in [employer] record.   Physical Exam: General: Well developed, well nourished, elderly, no acute distress Head: Normocephalic and atraumatic Eyes:  sclerae anicteric, EOMI Ears: Normal auditory acuity Mouth: No deformity or lesions Lungs: Clear throughout to auscultation Heart: Regular rate and rhythm; no murmurs, rubs or bruits Abdomen: Soft, non tender and non distended. No masses, hepatosplenomegaly or hernias noted. Normal Bowel sounds Rectal: Not done Musculoskeletal: Symmetrical with no gross deformities  Pulses:  Normal pulses noted Extremities: No clubbing, cyanosis, edema or deformities noted Neurological: Alert oriented x 4, grossly nonfocal Psychological:  Alert and cooperative. Normal mood and affect   Assessment and Recommendations:  1.  GERD with a history of esophageal stricture.  EGD with dilation performed in 12/30/2016.  Follow antireflux measures.  Continue pantoprazole 40 mg po daily long-term. REV in 1 year.  2.  Chronic idiopathic constipation.  MiraLAX 1 scoop daily and Colace daily.  If constipation persists increase MiraLAX to twice daily.  If stools are loose he can reduce MiraLAX to one half or three quarters of a scoop daily.

## 2019-05-31 NOTE — Patient Instructions (Signed)
We have sent the following medications to your pharmacy for you to pick up at your convenience: pantoprazole.   Take over the counter Miralax daily as needed for constipation symptoms.  Please go this website to sign up to be on waitlist for covid 19 vaccine. [email or web address]  Thank you for choosing me and [facility].  Pricilla Riffle. Dagoberto Ligas., MD., [facility]

## 2019-06-09 ENCOUNTER — Emergency Department (HOSPITAL_COMMUNITY): Payer: Medicare Other

## 2019-06-09 ENCOUNTER — Telehealth: Payer: Self-pay

## 2019-06-09 ENCOUNTER — Emergency Department (HOSPITAL_COMMUNITY)
Admission: EM | Admit: 2019-06-09 | Discharge: 2019-06-13 | Disposition: A | Discharge status: Other Acute Inpatient Hospital | Payer: Medicare Other | Attending: Emergency Medicine | Admitting: Emergency Medicine

## 2019-06-09 ENCOUNTER — Other Ambulatory Visit: Payer: Self-pay

## 2019-06-09 ENCOUNTER — Encounter (HOSPITAL_COMMUNITY): Payer: Self-pay

## 2019-06-09 DIAGNOSIS — Z79899 Other long term (current) drug therapy: Secondary | ICD-10-CM | POA: Diagnosis not present

## 2019-06-09 DIAGNOSIS — Z7982 Long term (current) use of aspirin: Secondary | ICD-10-CM | POA: Diagnosis not present

## 2019-06-09 DIAGNOSIS — F0390 Unspecified dementia without behavioral disturbance: Secondary | ICD-10-CM | POA: Insufficient documentation

## 2019-06-09 DIAGNOSIS — R4182 Altered mental status, unspecified: Secondary | ICD-10-CM | POA: Diagnosis not present

## 2019-06-09 DIAGNOSIS — Z20822 Contact with and (suspected) exposure to covid-19: Secondary | ICD-10-CM | POA: Diagnosis not present

## 2019-06-09 DIAGNOSIS — Z046 Encounter for general psychiatric examination, requested by authority: Secondary | ICD-10-CM | POA: Diagnosis present

## 2019-06-09 DIAGNOSIS — R410 Disorientation, unspecified: Secondary | ICD-10-CM | POA: Insufficient documentation

## 2019-06-09 DIAGNOSIS — Z87891 Personal history of nicotine dependence: Secondary | ICD-10-CM | POA: Insufficient documentation

## 2019-06-09 DIAGNOSIS — F0391 Unspecified dementia with behavioral disturbance: Secondary | ICD-10-CM | POA: Diagnosis not present

## 2019-06-09 DIAGNOSIS — R451 Restlessness and agitation: Secondary | ICD-10-CM | POA: Diagnosis not present

## 2019-06-09 LAB — CBC WITH DIFFERENTIAL/PLATELET
Abs Immature Granulocytes: 0.02 10*3/uL (ref 0.00–0.07)
Basophils Absolute: 0.1 10*3/uL (ref 0.0–0.1)
Basophils Relative: 1 %
Eosinophils Absolute: 0.1 10*3/uL (ref 0.0–0.5)
Eosinophils Relative: 1 %
HCT: 41.7 % (ref 39.0–52.0)
Hemoglobin: 15.1 g/dL (ref 13.0–17.0)
Immature Granulocytes: 0 %
Lymphocytes Relative: 18 %
Lymphs Abs: 1.3 10*3/uL (ref 0.7–4.0)
MCH: 32.9 pg (ref 26.0–34.0)
MCHC: 36.2 g/dL — ABNORMAL HIGH (ref 30.0–36.0)
MCV: 90.8 fL (ref 80.0–100.0)
Monocytes Absolute: 0.9 10*3/uL (ref 0.1–1.0)
Monocytes Relative: 13 %
Neutro Abs: 4.7 10*3/uL (ref 1.7–7.7)
Neutrophils Relative %: 67 %
Platelets: 170 10*3/uL (ref 150–400)
RBC: 4.59 MIL/uL (ref 4.22–5.81)
RDW: 12.6 % (ref 11.5–15.5)
WBC: 7 10*3/uL (ref 4.0–10.5)
nRBC: 0 % (ref 0.0–0.2)

## 2019-06-09 LAB — COMPREHENSIVE METABOLIC PANEL
ALT: 24 U/L (ref 0–44)
AST: 28 U/L (ref 15–41)
Albumin: 4 g/dL (ref 3.5–5.0)
Alkaline Phosphatase: 57 U/L (ref 38–126)
Anion gap: 9 (ref 5–15)
BUN: 20 mg/dL (ref 8–23)
CO2: 22 mmol/L (ref 22–32)
Calcium: 8.9 mg/dL (ref 8.9–10.3)
Chloride: 107 mmol/L (ref 98–111)
Creatinine, Ser: 1 mg/dL (ref 0.61–1.24)
GFR calc Af Amer: >60 mL/min (ref >60)
GFR calc non Af Amer: >60 mL/min (ref >60)
Glucose, Bld: 115 mg/dL — ABNORMAL HIGH (ref 70–99)
Potassium: 3.7 mmol/L (ref 3.5–5.1)
Sodium: 138 mmol/L (ref 135–145)
Total Bilirubin: 0.3 mg/dL (ref 0.3–1.2)
Total Protein: 6.7 g/dL (ref 6.5–8.1)

## 2019-06-09 LAB — RAPID URINE DRUG SCREEN, HOSP PERFORMED
Amphetamines: NOT DETECTED
Barbiturates: NOT DETECTED
Benzodiazepines: NOT DETECTED
Cocaine: NOT DETECTED
Opiates: NOT DETECTED
Tetrahydrocannabinol: NOT DETECTED

## 2019-06-09 LAB — SALICYLATE LEVEL: Salicylate Lvl: <7 mg/dL — ABNORMAL LOW (ref 7.0–30.0)

## 2019-06-09 LAB — ACETAMINOPHEN LEVEL: Acetaminophen (Tylenol), Serum: <10 ug/mL — ABNORMAL LOW (ref 10–30)

## 2019-06-09 LAB — URINALYSIS, ROUTINE W REFLEX MICROSCOPIC
Bilirubin Urine: NEGATIVE
Glucose, UA: NEGATIVE mg/dL
Hgb urine dipstick: NEGATIVE
Ketones, ur: NEGATIVE mg/dL
Leukocytes,Ua: NEGATIVE
Nitrite: NEGATIVE
Protein, ur: NEGATIVE mg/dL
Specific Gravity, Urine: 1.008 (ref 1.005–1.030)
pH: 7 (ref 5.0–8.0)

## 2019-06-09 LAB — TSH: TSH: 3.438 u[IU]/mL (ref 0.350–4.500)

## 2019-06-09 LAB — ETHANOL: Alcohol, Ethyl (B): <10 mg/dL

## 2019-06-09 MED ORDER — LORAZEPAM 1 MG PO TABS
1.0000 mg | ORAL_TABLET | ORAL | Status: DC | PRN
  Administered 2019-06-10 – 2019-06-11 (×3): 1 mg via ORAL
  Filled 2019-06-09 (×3): qty 1

## 2019-06-09 MED ORDER — PANTOPRAZOLE SODIUM 40 MG PO TBEC
40.0000 mg | DELAYED_RELEASE_TABLET | Freq: Every day | ORAL | Status: DC
  Administered 2019-06-10 – 2019-06-13 (×4): 40 mg via ORAL
  Filled 2019-06-09 (×4): qty 1

## 2019-06-09 MED ORDER — DOCUSATE SODIUM 100 MG PO CAPS
100.0000 mg | ORAL_CAPSULE | Freq: Two times a day (BID) | ORAL | Status: DC
  Administered 2019-06-09 – 2019-06-13 (×8): 100 mg via ORAL
  Filled 2019-06-09 (×8): qty 1

## 2019-06-09 MED ORDER — DONEPEZIL HCL 5 MG PO TABS
10.0000 mg | ORAL_TABLET | Freq: Every day | ORAL | Status: DC
  Administered 2019-06-09 – 2019-06-12 (×4): 10 mg via ORAL
  Filled 2019-06-09 (×5): qty 2

## 2019-06-09 MED ORDER — ASPIRIN EC 81 MG PO TBEC
81.0000 mg | DELAYED_RELEASE_TABLET | Freq: Every day | ORAL | Status: DC
  Administered 2019-06-10 – 2019-06-13 (×4): 81 mg via ORAL
  Filled 2019-06-09 (×4): qty 1

## 2019-06-09 MED ORDER — LORATADINE 10 MG PO TABS
10.0000 mg | ORAL_TABLET | Freq: Every day | ORAL | Status: DC
  Administered 2019-06-10 – 2019-06-13 (×4): 10 mg via ORAL
  Filled 2019-06-09 (×4): qty 1

## 2019-06-09 NOTE — Telephone Encounter (Signed)
Spoke with Dorma Russell, He is struggling with what the "right thing" is to do in this situation. He is not wanting to do anything that is going to cause the situation to escalate unnecessarily and cause harm to anyone involved. Son was emotional stating that he is fighting within himself the right thing to do and the best thing to do in this situation. Tim states that he knows that keeping his Father safe is the best course of action but he has strong feeling of guilt and feeling like he is a Wellsite geologist for having his Father involuntarily committed. I apologized to the son for not being able to give him more peace of mind and give more guidance in what the "right thing" is to do. I told him that I completely understand his feelings and where he is having a hard time but I told him that ultimately its about helping his Father and keeping him safe in the process. He stated that he is not going to go down to his Father's himself but that he may go to the [facility] office to file for IVC but he is not sure if legally he can do that even though he is direct next of kin (patient's wife is deceased); he not a POA for the patient.   Pt is requesting that Dr Damita Dunnings call him to further discuss. Tim states that he would feel better discussing with his Father's provider personally. I advised that I can tell his hesitation and I feel that speaking with Dr Damita Dunnings may also be the best option.   Will send to Dr Damita Dunnings.
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
## 2019-06-09 NOTE — Telephone Encounter (Signed)
If there is any concern for safety (and in this case there is given the access to guns), then please dial 911/advise son to dial 911 for sheriff for safety eval and ER eval for possible IVC.  Thanks.
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
## 2019-06-09 NOTE — BHH Counselor (Signed)
At 2211, clinician spoke to Jackelyn Poling, Retail banker to fax pt's IVC paperwork.   Once IVC paperwork has been received and reviewed clinician will complete pt's TTS assessment.    Vertell Novak, MS, [facility], [facility] Triage Specialist [phone].

## 2019-06-09 NOTE — ED Triage Notes (Signed)
Pt BIB officers due to being IVC'd by son because of dementia and confusion. Per IVC paperwork pt refuses to take medications and has "access to firearms. 

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
## 2019-06-09 NOTE — ED Notes (Signed)
EKG delayed due to equipment issue Charge RN Mortimer Fries and [place] made aware Will complete EKG when new equipment arrives

## 2019-06-10 ENCOUNTER — Telehealth: Payer: Self-pay

## 2019-06-10 LAB — URINALYSIS, ROUTINE W REFLEX MICROSCOPIC
Bilirubin Urine: NEGATIVE
Glucose, UA: NEGATIVE mg/dL
Hgb urine dipstick: NEGATIVE
Ketones, ur: NEGATIVE mg/dL
Leukocytes,Ua: NEGATIVE
Nitrite: NEGATIVE
Protein, ur: NEGATIVE mg/dL
Specific Gravity, Urine: 1.006 (ref 1.005–1.030)
pH: 7 (ref 5.0–8.0)

## 2019-06-10 NOTE — ED Notes (Signed)
Pt has been calm, cooperative and very pleasant all day. Able to do ADLs independently.

## 2019-06-10 NOTE — BH Assessment (Addendum)
[facility] Assessment Progress Note  Per Hampton Abbot, MD, this pt requires psychiatric hospitalization at this time.  Pt presents under IVC initiated by pt's son and upheld by Shirlyn Goltz, MD.  The following facilities have been contacted to seek placement for this pt, with results as noted:  Beds available, information sent, decision pending: [facility]  Not referred: [facility] (dementia is exclusionary) [facility] (dementia is exclusionary) [facility] (dementia is exclusionary) [facility] (dementia is exclusionary) [facility] (geriatric unit currently closed due to [place]) [facility] (geriatric unit currently closed for renovation)  Unable to reach: Rosana Hoes (left message at 14:31) [place] (left message at 14:32)   At capacity: [place], [place] Coordinator [phone]

## 2019-06-10 NOTE — Telephone Encounter (Signed)
Patient advised.

## 2019-06-10 NOTE — ED Notes (Signed)
Pt forgot that he had his medications this morning and told his sitter, "I have not had my medications since I have been here."  This Probation officer gently reminded him that this Probation officer gave him his medication this morning.  He is anxious about being here and would like to leave.  He seems to understand that he is under IVC and will be admitted to a hospital for further treatment.
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
## 2019-06-10 NOTE — BH Assessment (Signed)
Tele Assessment Note   Patient Name: Jeffrey [name] MRN: [id number] Referring Physician: Dr. Shirlyn Goltz Location of Patient: [facility] Location of Provider: [place]  DAVANTA JUERGENSEN is an 86 y.o. male brought in by officers due to  Being IVC'd by son because of dementia and confusion. Per IVC paperwork, patient refuses to take medications and has "access to fire moms". Patient denied SI, HI and psychosis. Patient also denied dementia, stating "there is nothing medically wrong with me, I am doing just fine, I am still handling all of my affairs". Patient was threatening his son today and his son was concerned so filled out IVC paperwork.  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  However he was concerned that his son may be against him since his first wife left him and his son may have held a grudge against him.  He was IVC by his son stating that patient has access to guns at home and is threatening him. Patient stated, according to paperwork, "if I do not get my way, I will go back to my military ways". Patient reported a lot about the death of his wife. During assessment patient spoke about his wife and what they used to do.   PER EDP NOTE: Patient reported that his wife has about $1 million in the bank and when he tried to check on it, there was nothing in the safe.  He saw that his son may have change the lock and have temper with it.    Patient was calm, talkative and cooperative during assessment. Patient was informing of life events and telling stories that happened in his life with his wife. Patient reported that him being in the hospital was a misunderstanding and how he keeps checks and balancing and keeps up with his own benefits.   Collateral Contact: Alroy Dust, Son, [phone] Patient gave permission, however unable to make contact at this time  Diagnosis:   Past Medical History:  Past  Medical History:  Diagnosis Date  . Adenomatous polyp of colon 1995    w/ HGD  . Allergic rhinitis   . BPH (benign prostatic hyperplasia)   . Diverticulosis 09/21/2000  . ED (erectile dysfunction)   . Hyperlipemia 05/05/1988  . Memory loss   . Schamberg's purpura    2012- eval by Dr. Allyson Sabal  . Sebaceous cyst   . Skin cancer 05/05/1998   hx of Scalp-melanoma  . TIA (transient ischemic attack)     Past Surgical History:  Procedure Laterality Date  . Carotid U/S Nml  09/08/2007  . Colon polyp  2011   B9 Int Hemms  . Colonoscopy polyps  10/05/2003   Divertics in hemms  . COLONOSCOPY W/ POLYPECTOMY  08/1993; 09/21/2000;10/05/2003   B9  . Echo (other)  09/08/2007   nml lvf EF 50-55% Mild Diast Dysfctn Triv Ar  . melanoma scalp  07/2003  . SEPTOPLASTY  1995  . Skin revision vertex of scalp  2009   Dr. Luetta Nutting, [facility]  . TONSILLECTOMY      Family History:  Family History  Problem Relation Age of Onset  . Rectal cancer Father   . Colon cancer Paternal Aunt   . Other Paternal Aunt        cancer on back of neck  . Prostate cancer Neg Hx     Social History:  reports that he quit smoking about 61 years ago. His  smoking use included cigarettes. He quit after 5.00 years of use. He has never used smokeless tobacco. He reports that he does not drink alcohol or use drugs.  Additional Social History:  Alcohol / Drug Use Pain Medications: see MAR Prescriptions: see MAR Over the Counter: see MAR  CIWA: CIWA-Ar BP: (!) 156/70 Pulse Rate: 77 COWS:    Allergies:  Allergies  Allergen Reactions  . Flonase [Fluticasone Propionate] Other (See Comments)    Nosebleeds.    . Tamsulosin     REACTION: flushed and arms felt stiff    Home Medications: (Not in a hospital admission)   OB/GYN Status:  No LMP for male patient.  General Assessment Data Assessment unable to be completed: Yes Reason for not completing assessment: At 2211, clinician spoke to Jackelyn Poling, Retail banker to fax pt's IVC  paperwork. Once IVC paperwork has been received and reviewed clinician will complete pt's TTS assessment Location of Assessment: [facility] TTS Assessment: In system Is this a Tele or Face-to-Face Assessment?: Tele Assessment Is this an Initial Assessment or a Re-assessment for this encounter?: Initial Assessment Patient Accompanied by:: N/A Language Other than English: No Living Arrangements: (family home) What gender do you identify as?: Male Marital status: Widowed Living Arrangements: Alone Can pt return to current living arrangement?: Yes Admission Status: Involuntary Is patient capable of signing voluntary admission?: Yes Referral Source: Self/Family/Friend  Crisis Care Plan Living Arrangements: Alone Legal Guardian: Other:(self) Name of Psychiatrist: (none) Name of Therapist: (none)  Education Status Is patient currently in school?: No Is the patient employed, unemployed or receiving disability?: Receiving disability income  Risk to self with the past 6 months Suicidal Ideation: No Has patient been a risk to self within the past 6 months prior to admission? : No Suicidal Intent: No Has patient had any suicidal intent within the past 6 months prior to admission? : No Is patient at risk for suicide?: No Suicidal Plan?: No Has patient had any suicidal plan within the past 6 months prior to admission? : No Access to Means: No What has been your use of drugs/alcohol within the last 12 months?: (none) Previous Attempts/Gestures: No How many times?: (0) Other Self Harm Risks: (none) Triggers for Past Attempts: (n/a) Intentional Self Injurious Behavior: None Family Suicide History: No Recent stressful life event(s): Conflict (Comment)(with son ) Persecutory voices/beliefs?: No Depression: No Depression Symptoms: (none) Substance abuse history and/or treatment for substance abuse?: No Suicide prevention information given to non-admitted patients: Not applicable  Risk to  Others within the past 6 months Homicidal Ideation: No Does patient have any lifetime risk of violence toward others beyond the six months prior to admission? : No Thoughts of Harm to Others: No Current Homicidal Intent: No Current Homicidal Plan: No Access to Homicidal Means: No Identified Victim: (0) History of harm to others?: No Assessment of Violence: None Noted Violent Behavior Description: (none) Does patient have access to weapons?: No Criminal Charges Pending?: No Does patient have a court date: No Is patient on probation?: No  Psychosis Hallucinations: None noted Delusions: None noted  Mental Status Report Appearance/Hygiene: Unremarkable Eye Contact: Fair Motor Activity: Freedom of movement Speech: Logical/coherent Level of Consciousness: Alert Mood: Anxious Affect: Anxious Anxiety Level: Moderate Thought Processes: Coherent Judgement: Partial Orientation: Person, Place, Time, Situation Obsessive Compulsive Thoughts/Behaviors: None  Cognitive Functioning Concentration: Poor Memory: Recent Intact Is patient IDD: No Insight: Fair Impulse Control: Poor Appetite: Fair Sleep: No Change Total Hours of Sleep: (unable to assess) Vegetative Symptoms: None  ADLScreening [facility] Assessment Services) Patient's cognitive ability adequate to safely complete daily activities?: Yes Patient able to express need for assistance with ADLs?: Yes Independently performs ADLs?: Yes (appropriate for developmental age)  ADL Screening (condition at time of admission) Patient's cognitive ability adequate to safely complete daily activities?: Yes Patient able to express need for assistance with ADLs?: Yes Independently performs ADLs?: Yes (appropriate for developmental age)  Advance Directives (For Healthcare) Does Patient Have a Medical Advance Directive?: No Would patient like information on creating a medical advance directive?: No - Patient declined   Disposition:   Disposition Initial Assessment Completed for this Encounter: Yes  Disposition pending collateral contact.  This service was provided via telemedicine using a 2-way, interactive audio and video technology.  Names of all persons participating in this telemedicine service and their role in this encounter. Name: Jeani Hawking Role: Patient  Name: Kirtland Bouchard Role: TTS Clinician  Name:  Role:   Name:  Role:     Venora Maples 06/10/2019 4:18 AM

## 2019-06-10 NOTE — Progress Notes (Signed)
Received Jeffrey [name] from the main ED, alert and able to follow directions. He was changed into scrubs and his personal clothes locked into his locker. He received an Ativan, 1 mg, from the transporting nurse and resting quietly in bed. He slept throughout the morning.

## 2019-06-10 NOTE — Consult Note (Signed)
Telepsych Consultation   Reason for Consult:  Involuntary Commitment Referring Physician:  Elvina Sidle Emergency Department Physician Location of Patient:  Location of Provider: [facility]  Patient Identification: Jeffrey [name] MRN:  [id number] Principal Diagnosis: <principal problem not specified> Diagnosis:  Active Problems:   * No active hospital problems. *   Total Time spent with patient: 30 minutes  Subjective:   Jeffrey [name] is a 86 y.o. male patient.  Patient assessed by nurse practitioner, along with Dr. Dwyane Dee.  Patient alert, oriented to self only at this time.  Patient states "I came in because my feet and legs were feeling stiff but they seem to be doing better now." Patient denies suicidal and homicidal ideations.  Patient denies history of self-harm.  Patient ruminates on death of his wife, states "my wife passed away in Jan 07, 2023."  Patient states several times during assessment "I was so happy with my wife."  Patient denies auditory and visual hallucinations.  Patient appears paranoid at times during assessment.  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 Patient reports he currently lives alone.  Patient reports challenges in managing living alone.  Patient reports he is independent of his activities of daily living.   Patient gives verbal consent to speak with his son TM.  HPI: Patient involuntarily committed, petition states the patient "refuses medications and has access to firearms."  Past Psychiatric History: Denies  Risk to Self: Suicidal Ideation: No Suicidal Intent: No Is patient at risk for suicide?: No Suicidal Plan?: No Access to Means: No What has been your use of drugs/alcohol within the last 12 months?: (none) How many times?: (0) Other Self Harm Risks: (none) Triggers for Past Attempts: (n/a) Intentional Self Injurious Behavior:  None Risk to Others: Homicidal Ideation: No Thoughts of Harm to Others: No Current Homicidal Intent: No Current Homicidal Plan: No Access to Homicidal Means: No Identified Victim: (0) History of harm to others?: No Assessment of Violence: None Noted Violent Behavior Description: (none) Does patient have access to weapons?: No Criminal Charges Pending?: No Does patient have a court date: No Prior Inpatient Therapy:   Prior Outpatient Therapy:    Past Medical History:  Past Medical History:  Diagnosis Date  . Adenomatous polyp of colon 1995    w/ HGD  . Allergic rhinitis   . BPH (benign prostatic hyperplasia)   . Diverticulosis 09/21/2000  . ED (erectile dysfunction)   . Hyperlipemia 05/05/1988  . Memory loss   . Schamberg's purpura    2012- eval by Dr. Allyson Sabal  . Sebaceous cyst   . Skin cancer 05/05/1998   hx of Scalp-melanoma  . TIA (transient ischemic attack)     Past Surgical History:  Procedure Laterality Date  . Carotid U/S Nml  09/08/2007  . Colon polyp  2011   B9 Int Hemms  . Colonoscopy polyps  10/05/2003   Divertics in hemms  . COLONOSCOPY W/ POLYPECTOMY  08/1993; 09/21/2000;10/05/2003   B9  . Echo (other)  09/08/2007   nml lvf EF 50-55% Mild Diast Dysfctn Triv Ar  . melanoma scalp  07/2003  . SEPTOPLASTY  1995  . Skin revision vertex of scalp  2009   Dr. Luetta Nutting, [facility]  . TONSILLECTOMY     Family History:  Family History  Problem Relation Age of Onset  . Rectal cancer Father   . Colon cancer Paternal Aunt   .  Other Paternal Aunt        cancer on back of neck  . Prostate cancer Neg Hx    Family Psychiatric  History: Unknown Social History:  Social History   Substance and Sexual Activity  Alcohol Use No     Social History   Substance and Sexual Activity  Drug Use No    Social History   Socioeconomic History  . Marital status: Married    Spouse name: Not on file  . Number of children: 1  . Years of education: Not on file  . Highest education level:  Bachelor's degree (e.g., BA, AB, BS)  Occupational History  . Occupation: retired-  lab [employer]    Employer: RETIRED  Tobacco Use  . Smoking status: Former Smoker    Years: 5.00    Types: Cigarettes    Quit date: 05/05/1958    Years since quitting: 61.1  . Smokeless tobacco: Never Used  Substance and Sexual Activity  . Alcohol use: No  . Drug use: No  . Sexual activity: Not on file  Other Topics Concern  . 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    Artist- paints/charcoal   Left handed   Social Determinants of Health   Financial Resource Strain: Low Risk   . Difficulty of Paying Living Expenses: Not hard at all  Food Insecurity: No Food Insecurity  . Worried About Charity fundraiser in the Last Year: Never true  . Ran Out of Food in the Last Year: Never true  Transportation Needs: No Transportation Needs  . Lack of Transportation (Medical): No  . Lack of Transportation (Non-Medical): No  Physical Activity: Insufficiently Active  . Days of Exercise per Week: 7 days  . Minutes of Exercise per Session: 10 min  Stress: No Stress Concern Present  . Feeling of Stress : Not at all  Social Connections:   . Frequency of Communication with Friends and Family: Not on file  . Frequency of Social Gatherings with Friends and Family: Not on file  . Attends Religious Services: Not on file  . Active Member of Clubs or Organizations: Not on file  . Attends Archivist Meetings: Not on file  . Marital Status: Not on file   Additional Social History:    Allergies:   Allergies  Allergen Reactions  . Flonase [Fluticasone Propionate] Other (See Comments)    Nosebleeds.    . Tamsulosin     REACTION: flushed and arms felt stiff    Labs:  Results for orders placed  or performed during the hospital encounter of 06/09/19 (from the past 48 hour(s))  CBC with Differential/Platelet     Status: Abnormal   Collection Time: 06/09/19  8:44 PM  Result Value Ref Range   WBC 7.0 4.0 - 10.5 K/uL   RBC 4.59 4.22 - 5.81 MIL/uL   Hemoglobin 15.1 13.0 - 17.0 g/dL   HCT 41.7 39.0 - 52.0 %   MCV 90.8 80.0 - 100.0 fL   MCH 32.9 26.0 - 34.0 pg   MCHC 36.2 (H) 30.0 - 36.0 g/dL   RDW 12.6 11.5 - 15.5 %   Platelets 170 150 - 400 K/uL   nRBC 0.0 0.0 - 0.2 %  Neutrophils Relative % 67 %   Neutro Abs 4.7 1.7 - 7.7 K/uL   Lymphocytes Relative 18 %   Lymphs Abs 1.3 0.7 - 4.0 K/uL   Monocytes Relative 13 %   Monocytes Absolute 0.9 0.1 - 1.0 K/uL   Eosinophils Relative 1 %   Eosinophils Absolute 0.1 0.0 - 0.5 K/uL   Basophils Relative 1 %   Basophils Absolute 0.1 0.0 - 0.1 K/uL   Immature Granulocytes 0 %   Abs Immature Granulocytes 0.02 0.00 - 0.07 K/uL    Comment: Performed at [facility], [place] [address], [place], [place] [address]  Comprehensive metabolic panel     Status: Abnormal   Collection Time: 06/09/19  8:44 PM  Result Value Ref Range   Sodium 138 135 - 145 mmol/L   Potassium 3.7 3.5 - 5.1 mmol/L   Chloride 107 98 - 111 mmol/L   CO2 22 22 - 32 mmol/L   Glucose, Bld 115 (H) 70 - 99 mg/dL   BUN 20 8 - 23 mg/dL   Creatinine, Ser 1.00 0.61 - 1.24 mg/dL   Calcium 8.9 8.9 - 10.3 mg/dL   Total Protein 6.7 6.5 - 8.1 g/dL   Albumin 4.0 3.5 - 5.0 g/dL   AST 28 15 - 41 U/L   ALT 24 0 - 44 U/L   Alkaline Phosphatase 57 38 - 126 U/L   Total Bilirubin 0.3 0.3 - 1.2 mg/dL   GFR calc non Af Amer >60 >60 mL/min   GFR calc Af Amer >60 >60 mL/min   Anion gap 9 5 - 15    Comment: Performed at [facility], [place] [address] [place], [place] [address]  Ethanol     Status: None   Collection Time: 06/09/19  8:44 PM  Result Value Ref Range   Alcohol, Ethyl (B) <10 <10 mg/dL    Comment: (NOTE) Lowest detectable limit for serum  alcohol is 10 mg/dL. For medical purposes only. Performed at [facility], [place] [address], [place], [place] [id number]   Salicylate level     Status: Abnormal   Collection Time: 06/09/19  8:44 PM  Result Value Ref Range   Salicylate Lvl [id number] (L) 7.0 - 30.0 mg/dL    Comment: Performed at [facility], [place] [address], [place], [place] [address]  Acetaminophen level     Status: Abnormal   Collection Time: 06/09/19  8:44 PM  Result Value Ref Range   Acetaminophen (Tylenol), Serum <10 (L) 10 - 30 ug/mL    Comment: (NOTE) Therapeutic concentrations vary significantly. A range of 10-30 ug/mL  may be an effective concentration for many patients. However, some  are best treated at concentrations outside of this range. Acetaminophen concentrations >150 ug/mL at 4 hours after ingestion  and >50 ug/mL at 12 hours after ingestion are often associated with  toxic reactions. Performed at [facility], [place] [address] [place], [place] [address]   Rapid urine drug screen (hospital performed)     Status: None   Collection Time: 06/09/19  8:44 PM  Result Value Ref Range   Opiates NONE DETECTED NONE DETECTED   Cocaine NONE DETECTED NONE DETECTED   Benzodiazepines NONE DETECTED NONE DETECTED   Amphetamines NONE DETECTED NONE DETECTED   Tetrahydrocannabinol NONE DETECTED NONE DETECTED   Barbiturates NONE DETECTED NONE DETECTED    Comment: (NOTE) DRUG SCREEN FOR MEDICAL PURPOSES ONLY.  IF CONFIRMATION IS NEEDED FOR ANY PURPOSE, NOTIFY LAB WITHIN  5 DAYS. LOWEST DETECTABLE LIMITS FOR URINE DRUG SCREEN Drug Class                     Cutoff (ng/mL) Amphetamine and metabolites    1000 Barbiturate and metabolites    200 Benzodiazepine                 [id number] Tricyclics and metabolites     300 Opiates and metabolites        300 Cocaine and metabolites        300 THC                            50 Performed at [facility], [place]  [address] [place], [place] [address]   Urinalysis, Routine w reflex microscopic     Status: Abnormal   Collection Time: 06/09/19  8:44 PM  Result Value Ref Range   Color, Urine STRAW (A) YELLOW   APPearance CLEAR CLEAR   Specific Gravity, Urine 1.008 1.005 - 1.030   pH 7.0 5.0 - 8.0   Glucose, UA NEGATIVE NEGATIVE mg/dL   Hgb urine dipstick NEGATIVE NEGATIVE   Bilirubin Urine NEGATIVE NEGATIVE   Ketones, ur NEGATIVE NEGATIVE mg/dL   Protein, ur NEGATIVE NEGATIVE mg/dL   Nitrite NEGATIVE NEGATIVE   Leukocytes,Ua NEGATIVE NEGATIVE    Comment: Performed at [facility] [address], [place], [place] [address]  TSH     Status: None   Collection Time: 06/09/19  8:50 PM  Result Value Ref Range   TSH 3.438 0.350 - 4.500 uIU/mL    Comment: Performed by a 3rd Generation assay with a functional sensitivity of <=0.01 uIU/mL. Performed at [facility], [place] [address] [place], [place] [address]   Urinalysis, Routine w reflex microscopic     Status: Abnormal   Collection Time: 06/10/19 10:39 AM  Result Value Ref Range   Color, Urine STRAW (A) YELLOW   APPearance CLEAR CLEAR   Specific Gravity, Urine 1.006 1.005 - 1.030   pH 7.0 5.0 - 8.0   Glucose, UA NEGATIVE NEGATIVE mg/dL   Hgb urine dipstick NEGATIVE NEGATIVE   Bilirubin Urine NEGATIVE NEGATIVE   Ketones, ur NEGATIVE NEGATIVE mg/dL   Protein, ur NEGATIVE NEGATIVE mg/dL   Nitrite NEGATIVE NEGATIVE   Leukocytes,Ua NEGATIVE NEGATIVE    Comment: Performed at [facility] [address] [place], [place] [address]    Medications:  Current Facility-Administered Medications  Medication Dose Route Frequency Provider Last Rate Last Admin  . aspirin EC tablet 81 mg  81 mg Oral Daily Drenda Freeze, MD   81 mg at 06/10/19 0901  . docusate sodium (COLACE) capsule 100 mg  100 mg Oral BID Drenda Freeze, MD   100 mg at 06/10/19 [id number]  . donepezil (ARICEPT) tablet 10 mg  10 mg  Oral QHS Drenda Freeze, MD   10 mg at 06/09/19 2352  . loratadine (CLARITIN) tablet 10 mg  10 mg Oral Daily Drenda Freeze, MD   10 mg at 06/10/19 0901  . LORazepam (ATIVAN) tablet 1 mg  1 mg Oral Q4H PRN Drenda Freeze, MD   1 mg at 06/10/19 0155  . pantoprazole (PROTONIX) EC tablet 40 mg  40 mg Oral Daily Drenda Freeze, MD   40 mg at 06/10/19 0901   Current Outpatient Medications  Medication Sig Dispense Refill  . acetaminophen (TYLENOL)  650 MG CR tablet Take 1,300 mg by mouth every 8 (eight) hours as needed for pain.    Marland Kitchen aspirin (BAYER ASPIRIN EC LOW DOSE) 81 MG EC tablet Take 81 mg by mouth daily.      . Citicoline (COGNITIVE HEALTH) 500 MG CAPS Take by mouth.    . Multiple Vitamins-Minerals (MULTIVITAL) tablet Take 1 tablet by mouth daily.      . pantoprazole (PROTONIX) 40 MG tablet TAKE 1 TABLET BY MOUTH IN THE MORNING (Patient taking differently: Take 40 mg by mouth daily. ) 30 tablet 11  . pravastatin (PRAVACHOL) 40 MG tablet Take 1 tablet (40 mg total) by mouth daily. 90 tablet 3  . simethicone (MYLICON) 0000000 MG chewable tablet Chew 125 mg by mouth every 6 (six) hours as needed for flatulence.    . Coenzyme Q10 (COQ-10) 100 MG CAPS Take 200 mg by mouth daily.     Marland Kitchen docusate sodium (COLACE) 100 MG capsule Take 1 capsule (100 mg total) by mouth 2 (two) times daily. 60 capsule 3  . donepezil (ARICEPT) 10 MG tablet Take 1 tablet (10 mg total) by mouth at bedtime. 90 tablet 1  . loratadine (CLARITIN) 10 MG tablet Take 10 mg by mouth daily.     . memantine (NAMENDA) 10 MG tablet Take 1 tablet (10 mg total) by mouth 2 (two) times daily. 60 tablet 10  . mupirocin ointment (BACTROBAN) 2 % Apply 1 application topically 3 (three) times daily. 22 g 1  . NASAL SPRAY SALINE NA Place 1 spray into both nostrils as needed (Congestion).    . NON FORMULARY Gold Bond Foot Cream    . polyethylene glycol powder (MIRALAX) 17 GM/SCOOP powder Take 17 g by mouth daily. 850 g 1     Musculoskeletal: Strength & Muscle Tone: within normal limits Gait & Station: normal Patient leans: N/A  Psychiatric Specialty Exam: Physical Exam  Nursing note and vitals reviewed. Constitutional: He appears well-developed.  HENT:  Head: Normocephalic.  Cardiovascular: Normal rate.  Respiratory: Effort normal.  Neurological: He is alert.  Psychiatric: He has a normal mood and affect. His behavior is normal.    Review of Systems  Constitutional: Negative.   HENT: Negative.   Eyes: Negative.   Respiratory: Negative.   Cardiovascular: Negative.   Gastrointestinal: Negative.   Genitourinary: Negative.   Musculoskeletal: Negative.   Skin: Negative.   Neurological: Negative.     Blood pressure (!) 126/57, pulse 69, temperature 97.7 F (36.5 C), temperature source Oral, resp. rate 16, height 5\' 8"  (1.727 m), weight 65 kg, SpO2 100 %.Body mass index is 21.79 kg/m.  General Appearance: Casual and Fairly Groomed  Eye Contact:  Fair  Speech:  Clear and Coherent and Normal Rate  Volume:  Normal  Mood:  Depressed  Affect:  Depressed  Thought Process:  Coherent, Goal Directed and Descriptions of Associations: Intact  Orientation:  Other:  self only  Thought Content:  Rumination  Suicidal Thoughts:  No  Homicidal Thoughts:  No  Memory:  Immediate;   Fair Remote;   Fair  Judgement:  Impaired  Insight:  Lacking  Psychomotor Activity:  Normal  Concentration:  Concentration: Good  Recall:  Good  Fund of Knowledge:  Good  Language:  Good  Akathisia:  No  Handed:  Right  AIMS (if indicated):     Assets:  Communication Skills Desire for Improvement Financial Resources/Insurance Housing Leisure Time Physical Health Social Support  ADL's:  Intact  Cognition:  Impaired,  Mild  Sleep:        Treatment Plan Summary: Daily contact with patient to assess and evaluate symptoms and progress in treatment  Disposition: Recommend psychiatric Inpatient admission when  medically cleared. Supportive therapy provided about ongoing stressors. Discussed crisis plan, support from social network, calling 911, coming to the Emergency Department, and calling Suicide Hotline.  This service was provided via telemedicine using a 2-way, interactive audio and video technology.  Names of all persons participating in this telemedicine service and their role in this encounter. Name: Jeani Hawking Role: Patient  Name: Letitia Libra Role: [place], [place] 06/10/2019 2:50 PM

## 2019-06-10 NOTE — ED Notes (Signed)
Son, [name], [phone], reports that pt has been anxious, angry, paranoid, believing that some is trying to get in his house, steal his money, accusing his son of stealing things. 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 Said that he might have to change the locks. Reported a break-in that did not happen.

## 2019-06-10 NOTE — Telephone Encounter (Signed)
Please notify son.  In ER currently with assessment ongoing.  I'm awaiting final report from ER.  Thanks.

## 2019-06-10 NOTE — Telephone Encounter (Signed)
Addendum, late entry.  I called 911 yesterday due to the report of the patient's son.  The patient was reportedly increasingly agitated and had known access to handguns.  The patient was increasingly confused according to son's report.  His son thought that if he went to the patient's house then the patient's agitation/behavior change could get worse and potentially put someone at risk.  I called 911 and discussed with the dispatcher.  I subsequently got a call from [employer].  My understanding is that he declined to evaluate or intervene on the patient at that point unless the IVC was already done.  I explicitly stated my concern was that the patient could be at risk otherwise in the meantime and I needed them to check on the patient.  Per the sheriff's deputy "I can't take his guns."  I explained that I do not need or expect the guns to be removed from the home at the time of the phone call.  I need the patient transported to the emergency room for medical evaluation and possible IVC.  This was explained to the officer.  I do not know if the sheriff's deputy will go to the house now.   I did call his son back and explained the situation.  I am in clinic at [facility] and it would likely be quicker for his son to go to the magistrate and fill out the IVC papers.  At that point the sheriff's deputy should be able to escort the patient to the emergency room.  Patient's son agreed and he said he would go directly to have the paperwork completed.

## 2019-06-10 NOTE — Telephone Encounter (Signed)
See below.  I see that the patient was seen in the emergency room and placed under involuntary commitment.  Please get update from patient's son about the situation.  Please tell the son that I thank him for his effort.  Thanks.

## 2019-06-11 DIAGNOSIS — F0391 Unspecified dementia with behavioral disturbance: Secondary | ICD-10-CM | POA: Diagnosis not present

## 2019-06-11 LAB — RESPIRATORY PANEL BY RT PCR (FLU A&B, COVID)
Influenza A by PCR: NEGATIVE
Influenza B by PCR: NEGATIVE
SARS Coronavirus 2 by RT PCR: NEGATIVE

## 2019-06-11 MED ORDER — MEMANTINE HCL 10 MG PO TABS
10.0000 mg | ORAL_TABLET | Freq: Two times a day (BID) | ORAL | Status: DC
  Administered 2019-06-11 – 2019-06-13 (×5): 10 mg via ORAL
  Filled 2019-06-11 (×5): qty 1

## 2019-06-11 NOTE — Progress Notes (Signed)
Patient meets criteria for inpatient treatment per Ricky Ala, NP. No appropriate beds at [facility] currently. CSW faxed referrals to the following facilities for review:  [facility]   [facility]   [facility]     TTS will continue to seek bed placement.     Darletta Moll MSW, [place] Worker Disposition  [facility] Ph: [phone] Fax: [phone] 06/11/2019 2:16 PM

## 2019-06-11 NOTE — ED Notes (Signed)
Pt resting in bed comfortably.

## 2019-06-11 NOTE — ED Notes (Signed)
Pt alert. Pt confused and disorganized. Pt need continuous redirection and reassurance. Pt worried and anxious about who is taking care of house and money.

## 2019-06-11 NOTE — Progress Notes (Signed)
[facility] MD Progress Note  06/11/2019 10:21 AM Jeffrey [name]  MRN:  [id number]   Subjective:  Jeffrey [name] was seen and evaluated by nurse practitioner.  Case staffed with attending psychiatrist [place].  Patient continues to present with confusion.  He is awake alert and oriented to self.  Patient states the year is 84.  Continues to reminisce with the passing of his wife.  Patient reports " why am I here."  NP attempted to reorient patient.  Cites his son is after his money.  States he has a Chief Executive Officer and is eager to get back home.  He denies suicidal or homicidal ideations.  Denies auditory or visual hallucinations.  NP will restart home medication. Continue seeking inpatient admission.  Support, encouragement and reassurance.  History: Jeffrey [name] is an 86 y.o. male brought in by officers due to  Being IVC'd by son because of dementia and confusion. Per IVC paperwork, patient refuses to take medications and has "access to fire moms". Patient denied SI, HI and psychosis. Patient also denied dementia, stating "there is nothing medically wrong with me, I am doing just fine, I am still handling all of my affairs". Patient was threatening his son today and his son was concerned so filled out IVC paperwork. 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 However he was concerned that his son may be against him since his first wife left him and his son may have held a grudge against him. He was IVC by his son stating that patient has access to guns at home and is threatening him. Patient stated, according to paperwork, "if I do not get my way, I will go back to my military ways". Patient reported a lot about the death of his wife. During assessment patient spoke about his wife and what they used to do.   Principal Problem: <principal problem not specified> Diagnosis: Active Problems:   * No active hospital problems. *  Total Time spent with  patient: 15 minutes  Past Psychiatric History:  Past Medical History:  Past Medical History:  Diagnosis Date  . Adenomatous polyp of colon 1995    w/ HGD  . Allergic rhinitis   . BPH (benign prostatic hyperplasia)   . Diverticulosis 09/21/2000  . ED (erectile dysfunction)   . Hyperlipemia 05/05/1988  . Memory loss   . Schamberg's purpura    2012- eval by Dr. Allyson Sabal  . Sebaceous cyst   . Skin cancer 05/05/1998   hx of Scalp-melanoma  . TIA (transient ischemic attack)     Past Surgical History:  Procedure Laterality Date  . Carotid U/S Nml  09/08/2007  . Colon polyp  2011   B9 Int Hemms  . Colonoscopy polyps  10/05/2003   Divertics in hemms  . COLONOSCOPY W/ POLYPECTOMY  08/1993; 09/21/2000;10/05/2003   B9  . Echo (other)  09/08/2007   nml lvf EF 50-55% Mild Diast Dysfctn Triv Ar  . melanoma scalp  07/2003  . SEPTOPLASTY  1995  . Skin revision vertex of scalp  2009   Dr. Luetta Nutting, [facility]  . TONSILLECTOMY     Family History:  Family History  Problem Relation Age of Onset  . Rectal cancer Father   . Colon cancer Paternal Aunt   . Other Paternal Aunt        cancer on back of neck  . Prostate cancer Neg Hx    Family Psychiatric  History:  Social History:  Social History   Substance and Sexual Activity  Alcohol Use No     Social History   Substance and Sexual Activity  Drug Use No    Social History   Socioeconomic History  . Marital status: Married    Spouse name: Not on file  . Number of children: 1  . Years of education: Not on file  . Highest education level: Bachelor's degree (e.g., BA, AB, BS)  Occupational History  . Occupation: retired-  lab [employer]    Employer: RETIRED  Tobacco Use  . Smoking status: Former Smoker    Years: 5.00    Types: Cigarettes    Quit date: 05/05/1958    Years since quitting: 61.1  . Smokeless tobacco: Never Used  Substance and Sexual Activity  . Alcohol use: No  . Drug use: No  . Sexual activity: Not on file  Other Topics  Concern  . 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    Artist- paints/charcoal   Left handed   Social Determinants of Health   Financial Resource Strain: Low Risk   . Difficulty of Paying Living Expenses: Not hard at all  Food Insecurity: No Food Insecurity  . Worried About Charity fundraiser in the Last Year: Never true  . Ran Out of Food in the Last Year: Never true  Transportation Needs: No Transportation Needs  . Lack of Transportation (Medical): No  . Lack of Transportation (Non-Medical): No  Physical Activity: Insufficiently Active  . Days of Exercise per Week: 7 days  . Minutes of Exercise per Session: 10 min  Stress: No Stress Concern Present  . Feeling of Stress : Not at all  Social Connections:   . Frequency of Communication with Friends and Family: Not on file  . Frequency of Social Gatherings with Friends and Family: Not on file  . Attends Religious Services: Not on file  . Active Member of Clubs or Organizations: Not on file  . Attends Archivist Meetings: Not on file  . Marital Status: Not on file   Additional Social History:    Pain Medications: see MAR Prescriptions: see MAR Over the Counter: see MAR                    Sleep: Fair  Appetite:  Fair  Current Medications: Current Facility-Administered Medications  Medication Dose Route Frequency Provider Last Rate Last Admin  . aspirin EC tablet 81 mg  81 mg Oral Daily Drenda Freeze, MD   81 mg at 06/10/19 0901  . docusate sodium (COLACE) capsule 100 mg  100 mg Oral BID Drenda Freeze, MD   100 mg at 06/10/19 Aug 04, 2250  . donepezil (ARICEPT) tablet 10 mg  10 mg Oral QHS Drenda Freeze, MD   10 mg at 06/10/19 2250/08/04  . loratadine (CLARITIN) tablet 10 mg  10 mg Oral Daily Drenda Freeze, MD   10 mg at 06/10/19 0901  . LORazepam (ATIVAN) tablet 1 mg  1 mg Oral Q4H PRN Drenda Freeze, MD   1 mg at 06/10/19 Aug 04, 2250  . pantoprazole (PROTONIX) EC tablet 40 mg  40 mg Oral Daily Drenda Freeze, MD   40 mg  at 06/10/19 0901   Current Outpatient Medications  Medication Sig Dispense Refill  . acetaminophen (TYLENOL) 650 MG CR tablet Take 1,300 mg by mouth every 8 (eight) hours as needed for pain.    Marland Kitchen aspirin (BAYER ASPIRIN EC LOW DOSE) 81 MG EC tablet Take 81 mg by mouth daily.      . Citicoline (COGNITIVE HEALTH) 500 MG CAPS Take by mouth.    . Multiple Vitamins-Minerals (MULTIVITAL) tablet Take 1 tablet by mouth daily.      . pantoprazole (PROTONIX) 40 MG tablet TAKE 1 TABLET BY MOUTH IN THE MORNING (Patient taking differently: Take 40 mg by mouth daily. ) 30 tablet 11  . pravastatin (PRAVACHOL) 40 MG tablet Take 1 tablet (40 mg total) by mouth daily. 90 tablet 3  . simethicone (MYLICON) 0000000 MG chewable tablet Chew 125 mg by mouth every 6 (six) hours as needed for flatulence.    . Coenzyme Q10 (COQ-10) 100 MG CAPS Take 200 mg by mouth daily.     Marland Kitchen docusate sodium (COLACE) 100 MG capsule Take 1 capsule (100 mg total) by mouth 2 (two) times daily. 60 capsule 3  . donepezil (ARICEPT) 10 MG tablet Take 1 tablet (10 mg total) by mouth at bedtime. 90 tablet 1  . loratadine (CLARITIN) 10 MG tablet Take 10 mg by mouth daily.     . memantine (NAMENDA) 10 MG tablet Take 1 tablet (10 mg total) by mouth 2 (two) times daily. 60 tablet 10  . mupirocin ointment (BACTROBAN) 2 % Apply 1 application topically 3 (three) times daily. 22 g 1  . NASAL SPRAY SALINE NA Place 1 spray into both nostrils as needed (Congestion).    . NON FORMULARY Gold Bond Foot Cream    . polyethylene glycol powder (MIRALAX) 17 GM/SCOOP powder Take 17 g by mouth daily. 850 g 1    Lab Results:  Results for orders placed or performed during the hospital encounter of 06/09/19 (from the past 48 hour(s))  CBC  with Differential/Platelet     Status: Abnormal   Collection Time: 06/09/19  8:44 PM  Result Value Ref Range   WBC 7.0 4.0 - 10.5 K/uL   RBC 4.59 4.22 - 5.81 MIL/uL   Hemoglobin 15.1 13.0 - 17.0 g/dL   HCT 41.7 39.0 - 52.0 %   MCV 90.8 80.0 - 100.0 fL   MCH 32.9 26.0 - 34.0 pg   MCHC 36.2 (H) 30.0 - 36.0 g/dL   RDW 12.6 11.5 - 15.5 %   Platelets 170 150 - 400 K/uL   nRBC 0.0 0.0 - 0.2 %   Neutrophils Relative % 67 %   Neutro Abs 4.7 1.7 - 7.7 K/uL   Lymphocytes Relative 18 %   Lymphs Abs 1.3 0.7 - 4.0 K/uL   Monocytes Relative 13 %   Monocytes Absolute 0.9 0.1 - 1.0 K/uL   Eosinophils Relative 1 %   Eosinophils Absolute 0.1 0.0 - 0.5 K/uL   Basophils Relative 1 %   Basophils Absolute 0.1 0.0 - 0.1 K/uL   Immature Granulocytes 0 %   Abs Immature Granulocytes 0.02 0.00 - 0.07 K/uL    Comment: Performed at [facility], [place] [address] [place],  [address]  Comprehensive metabolic panel     Status: Abnormal   Collection Time: 06/09/19  8:44 PM  Result Value Ref Range   Sodium 138 135 - 145 mmol/L   Potassium 3.7 3.5 - 5.1 mmol/L   Chloride 107 98 -  111 mmol/L   CO2 22 22 - 32 mmol/L   Glucose, Bld 115 (H) 70 - 99 mg/dL   BUN 20 8 - 23 mg/dL   Creatinine, Ser 1.00 0.61 - 1.24 mg/dL   Calcium 8.9 8.9 - 10.3 mg/dL   Total Protein 6.7 6.5 - 8.1 g/dL   Albumin 4.0 3.5 - 5.0 g/dL   AST 28 15 - 41 U/L   ALT 24 0 - 44 U/L   Alkaline Phosphatase 57 38 - 126 U/L   Total Bilirubin 0.3 0.3 - 1.2 mg/dL   GFR calc non Af Amer >60 >60 mL/min   GFR calc Af Amer >60 >60 mL/min   Anion gap 9 5 - 15    Comment: Performed at [facility], [place] [address], [place], [place] [address]  Ethanol     Status: None   Collection Time: 06/09/19  8:44 PM  Result Value Ref Range   Alcohol, Ethyl (B) <10 <10 mg/dL    Comment: (NOTE) Lowest detectable limit for serum alcohol is 10 mg/dL. For medical purposes only. Performed at [facility], [place] [address], [place], [place] [id number]   Salicylate level     Status: Abnormal   Collection Time: 06/09/19  8:44 PM  Result Value Ref Range   Salicylate Lvl [id number] (L) 7.0 - 30.0 mg/dL    Comment: Performed at [facility], [place] [address], [place], [place] [address]  Acetaminophen level     Status: Abnormal   Collection Time: 06/09/19  8:44 PM  Result Value Ref Range   Acetaminophen (Tylenol), Serum <10 (L) 10 - 30 ug/mL    Comment: (NOTE) Therapeutic concentrations vary significantly. A range of 10-30 ug/mL  may be an effective concentration for many patients. However, some  are best treated at concentrations outside of this range. Acetaminophen concentrations >150 ug/mL at 4 hours after ingestion  and >50 ug/mL at 12 hours after ingestion are often associated with  toxic reactions. Performed at [facility], [place] [address] [place], [place] [address]   Rapid urine drug screen (hospital performed)     Status: None   Collection Time: 06/09/19  8:44 PM  Result Value Ref Range   Opiates NONE DETECTED NONE DETECTED   Cocaine NONE DETECTED NONE DETECTED   Benzodiazepines NONE DETECTED NONE DETECTED   Amphetamines NONE DETECTED NONE DETECTED   Tetrahydrocannabinol NONE DETECTED NONE DETECTED   Barbiturates NONE DETECTED NONE DETECTED    Comment: (NOTE) DRUG SCREEN FOR MEDICAL PURPOSES ONLY.  IF CONFIRMATION IS NEEDED FOR ANY PURPOSE, NOTIFY LAB WITHIN 5 DAYS. LOWEST DETECTABLE LIMITS FOR URINE DRUG SCREEN Drug Class                     Cutoff (ng/mL) Amphetamine and metabolites    1000 Barbiturate and metabolites    200 Benzodiazepine                 [id number] Tricyclics and metabolites     300 Opiates and metabolites        300 Cocaine and metabolites        300 THC                            50 Performed at [facility], [place] [address] [place], [place] [address]   Urinalysis, Routine w reflex microscopic      Status: Abnormal  Collection Time: 06/09/19  8:44 PM  Result Value Ref Range   Color, Urine STRAW (A) YELLOW   APPearance CLEAR CLEAR   Specific Gravity, Urine 1.008 1.005 - 1.030   pH 7.0 5.0 - 8.0   Glucose, UA NEGATIVE NEGATIVE mg/dL   Hgb urine dipstick NEGATIVE NEGATIVE   Bilirubin Urine NEGATIVE NEGATIVE   Ketones, ur NEGATIVE NEGATIVE mg/dL   Protein, ur NEGATIVE NEGATIVE mg/dL   Nitrite NEGATIVE NEGATIVE   Leukocytes,Ua NEGATIVE NEGATIVE    Comment: Performed at [place] [address] [place], [place] [address]  TSH     Status: None   Collection Time: 06/09/19  8:50 PM  Result Value Ref Range   TSH 3.438 0.350 - 4.500 uIU/mL    Comment: Performed by a 3rd Generation assay with a functional sensitivity of <=0.01 uIU/mL. Performed at [facility], [place] [address] [place], [place] [address]   Urinalysis, Routine w reflex microscopic     Status: Abnormal   Collection Time: 06/10/19 10:39 AM  Result Value Ref Range   Color, Urine STRAW (A) YELLOW   APPearance CLEAR CLEAR   Specific Gravity, Urine 1.006 1.005 - 1.030   pH 7.0 5.0 - 8.0   Glucose, UA NEGATIVE NEGATIVE mg/dL   Hgb urine dipstick NEGATIVE NEGATIVE   Bilirubin Urine NEGATIVE NEGATIVE   Ketones, ur NEGATIVE NEGATIVE mg/dL   Protein, ur NEGATIVE NEGATIVE mg/dL   Nitrite NEGATIVE NEGATIVE   Leukocytes,Ua NEGATIVE NEGATIVE    Comment: Performed at [facility] [address], [place], [place] [address]    Blood Alcohol level:  Lab Results  Component Value Date   ETH <10 [id number]    Metabolic Disorder Labs: No results found for: HGBA1C, MPG No results found for: PROLACTIN Lab Results  Component Value Date   CHOL 132 01/28/2019   TRIG 200.0 (H) 01/28/2019   HDL 60.00 01/28/2019   CHOLHDL 2 01/28/2019   VLDL 40.0 01/28/2019   LDLCALC 32 01/28/2019   LDLCALC 60 12/29/2016    Physical Findings: AIMS:  , ,  ,  ,    CIWA:    COWS:      Musculoskeletal:  Psychiatric Specialty Exam: Physical Exam  Vitals reviewed. Constitutional: He appears well-developed.  Psychiatric: He has a normal mood and affect. His behavior is normal.    Review of Systems  All other systems reviewed and are negative.   Blood pressure 132/69, pulse 66, temperature 98.3 F (36.8 C), temperature source Oral, resp. rate 15, height 5\' 8"  (1.727 m), weight 65 kg, SpO2 95 %.Body mass index is 21.79 kg/m.  General Appearance: Casual  Eye Contact:  Good  Speech:  Clear and Coherent  Volume:  Normal  Mood:  Anxious  Affect:  Congruent  Thought Process:  Coherent  Orientation:  Other:  person and place  Thought Content:  Rumination  Suicidal Thoughts:  No  Homicidal Thoughts:  No  Memory:  Immediate;   Poor Recent;   Poor  Judgement:  Impaired  Insight:  Lacking  Psychomotor Activity:  resting in bed  Concentration:  Concentration: Fair  Recall:  [employer] of Knowledge:  Poor  Language:  Good  Akathisia:  No  Handed:  Right  AIMS (if indicated):     Assets:  Communication Skills Desire for Improvement Resilience Social Support  ADL's:  Intact  Cognition:  Impaired,  Mild  Sleep:        Treatment Plan Summary: Daily  contact with patient to assess and evaluate symptoms and progress in treatment and Medication management   Restarted Namenda 10mg  po BID - home medicaitons Continue seeking inpatient admission   D[place]ill [place], NP 06/11/2019, 10:21 AM

## 2019-06-11 NOTE — BHH Counselor (Signed)
Jeffrey [name] from [facility] in [place], [place] is interested in placement of the Patient and is requesting copy of IVC paperwork and COVID-19 testing results.  Jeffrey [name] requests information be faxed to "[phone]."

## 2019-06-11 NOTE — ED Provider Notes (Signed)
Patient seen on rounds this morning.  He is without specific new acute complaint.  No new reported issues overnight per nursing.  IVC active.  Patient is pending inpatient psychiatric placement.     Valarie Merino, MD 06/11/19 [phone]

## 2019-06-12 NOTE — Consult Note (Signed)
Jeffrey [name] seen and evaluated by nurse practitioner.  Continues to deny suicidal or homicidal ideations.  Denies auditory or visual hallucinations.  Patient is pleasantly confused.  Continues to report he feels as if his son is trying to take all his assets.  Patient has been accepted to [facility]. Pending bed placement. Support, encouragement and reassurance was provided.

## 2019-06-12 NOTE — ED Provider Notes (Signed)
Patient seen on ED rounds this morning.  No acute events overnight per nursing staff.  Patient appears to be comfortable and is without specific new complaint.  Placement pending.     Valarie Merino, MD 06/12/19 [phone]

## 2019-06-12 NOTE — Progress Notes (Signed)
06/12/2019  [place] [phone] Left message to see if patient could be transported today after 7pm or tomorrow 2/8 after 7am. Waiting for [facility] to call back to schedule transport.

## 2019-06-12 NOTE — Progress Notes (Signed)
Pt accepted to [facility] is the [facility] provider.  Call report to [phone] Pt is IVC   Pt may be transported by [facility].  Pt scheduled to arrive at [facility] after 7pm on 2/7 or anytime on 2/8   Darletta Moll MSW, [place] [facility] Ph: [phone] Fax: [phone]  06/12/2019 10:16 AM

## 2019-06-12 NOTE — Progress Notes (Signed)
CSW faxed COVID results and IVC paperwork to [facility].   Darletta Moll MSW, [facility] Worker Disposition  [facility] Ph: [phone] Fax: [phone]

## 2019-06-13 DIAGNOSIS — F0151 Vascular dementia with behavioral disturbance: Secondary | ICD-10-CM | POA: Diagnosis not present

## 2019-06-13 DIAGNOSIS — Z130 Encounter for screening for diseases of the blood and blood-forming organs and certain disorders involving the immune mechanism: Secondary | ICD-10-CM | POA: Diagnosis not present

## 2019-06-13 DIAGNOSIS — E559 Vitamin D deficiency, unspecified: Secondary | ICD-10-CM | POA: Diagnosis not present

## 2019-06-13 DIAGNOSIS — Z13228 Encounter for screening for other metabolic disorders: Secondary | ICD-10-CM | POA: Diagnosis not present

## 2019-06-13 DIAGNOSIS — Z131 Encounter for screening for diabetes mellitus: Secondary | ICD-10-CM | POA: Diagnosis not present

## 2019-06-13 DIAGNOSIS — R451 Restlessness and agitation: Secondary | ICD-10-CM | POA: Diagnosis not present

## 2019-06-13 DIAGNOSIS — E8881 Metabolic syndrome: Secondary | ICD-10-CM | POA: Diagnosis not present

## 2019-06-13 DIAGNOSIS — I10 Essential (primary) hypertension: Secondary | ICD-10-CM | POA: Diagnosis not present

## 2019-06-13 DIAGNOSIS — Z7982 Long term (current) use of aspirin: Secondary | ICD-10-CM | POA: Diagnosis not present

## 2019-06-13 DIAGNOSIS — F0390 Unspecified dementia without behavioral disturbance: Secondary | ICD-10-CM | POA: Diagnosis present

## 2019-06-13 DIAGNOSIS — F323 Major depressive disorder, single episode, severe with psychotic features: Secondary | ICD-10-CM | POA: Diagnosis present

## 2019-06-13 DIAGNOSIS — R946 Abnormal results of thyroid function studies: Secondary | ICD-10-CM | POA: Diagnosis not present

## 2019-06-13 DIAGNOSIS — K5900 Constipation, unspecified: Secondary | ICD-10-CM | POA: Diagnosis not present

## 2019-06-13 DIAGNOSIS — D510 Vitamin B12 deficiency anemia due to intrinsic factor deficiency: Secondary | ICD-10-CM | POA: Diagnosis not present

## 2019-06-13 DIAGNOSIS — Z20822 Contact with and (suspected) exposure to covid-19: Secondary | ICD-10-CM | POA: Diagnosis not present

## 2019-06-13 DIAGNOSIS — Z049 Encounter for examination and observation for unspecified reason: Secondary | ICD-10-CM | POA: Diagnosis not present

## 2019-06-13 DIAGNOSIS — F23 Brief psychotic disorder: Secondary | ICD-10-CM | POA: Diagnosis not present

## 2019-06-13 DIAGNOSIS — K5909 Other constipation: Secondary | ICD-10-CM | POA: Diagnosis not present

## 2019-06-13 DIAGNOSIS — R238 Other skin changes: Secondary | ICD-10-CM | POA: Diagnosis not present

## 2019-06-13 DIAGNOSIS — I6782 Cerebral ischemia: Secondary | ICD-10-CM | POA: Diagnosis not present

## 2019-06-13 DIAGNOSIS — R8290 Unspecified abnormal findings in urine: Secondary | ICD-10-CM | POA: Diagnosis not present

## 2019-06-13 DIAGNOSIS — F0391 Unspecified dementia with behavioral disturbance: Secondary | ICD-10-CM | POA: Diagnosis not present

## 2019-06-13 DIAGNOSIS — R030 Elevated blood-pressure reading, without diagnosis of hypertension: Secondary | ICD-10-CM | POA: Diagnosis not present

## 2019-06-13 DIAGNOSIS — Z712 Person consulting for explanation of examination or test findings: Secondary | ICD-10-CM | POA: Diagnosis not present

## 2019-06-13 DIAGNOSIS — R208 Other disturbances of skin sensation: Secondary | ICD-10-CM | POA: Diagnosis not present

## 2019-06-13 DIAGNOSIS — Z5181 Encounter for therapeutic drug level monitoring: Secondary | ICD-10-CM | POA: Diagnosis not present

## 2019-06-13 DIAGNOSIS — F29 Unspecified psychosis not due to a substance or known physiological condition: Secondary | ICD-10-CM | POA: Diagnosis not present

## 2019-06-13 DIAGNOSIS — R410 Disorientation, unspecified: Secondary | ICD-10-CM | POA: Diagnosis not present

## 2019-06-13 DIAGNOSIS — Z20828 Contact with and (suspected) exposure to other viral communicable diseases: Secondary | ICD-10-CM | POA: Diagnosis not present

## 2019-06-13 DIAGNOSIS — F419 Anxiety disorder, unspecified: Secondary | ICD-10-CM | POA: Diagnosis present

## 2019-06-13 DIAGNOSIS — R7309 Other abnormal glucose: Secondary | ICD-10-CM | POA: Diagnosis not present

## 2019-06-13 DIAGNOSIS — K582 Mixed irritable bowel syndrome: Secondary | ICD-10-CM | POA: Diagnosis not present

## 2019-06-13 DIAGNOSIS — F200 Paranoid schizophrenia: Secondary | ICD-10-CM | POA: Diagnosis not present

## 2019-06-13 DIAGNOSIS — Z79899 Other long term (current) drug therapy: Secondary | ICD-10-CM | POA: Diagnosis not present

## 2019-06-13 DIAGNOSIS — Z13220 Encounter for screening for lipoid disorders: Secondary | ICD-10-CM | POA: Diagnosis not present

## 2019-06-13 DIAGNOSIS — E785 Hyperlipidemia, unspecified: Secondary | ICD-10-CM | POA: Diagnosis not present

## 2019-06-13 NOTE — ED Notes (Signed)
Called report to Naval architect at [facility] in [place].

## 2019-06-13 NOTE — Discharge Summary (Signed)
  Patient to be transferred to [facility] for inpatient psychiatric treat

## 2019-06-13 NOTE — ED Notes (Signed)
Tried to call report; tried to call admissions again. No answer. Sheriff dept called and stated that they are on the way to pick pt up to go to [facility] based on a plan that they received from night shift.

## 2019-06-13 NOTE — ED Notes (Signed)
Tried to call report to [facility] and there was no answer at the extension given.

## 2019-06-13 NOTE — ED Notes (Signed)
Transported to [facility] in [place] by [facility]. All belongings given to the sheriff. Pt was cooperative with the transport.

## 2019-06-14 DIAGNOSIS — R030 Elevated blood-pressure reading, without diagnosis of hypertension: Secondary | ICD-10-CM | POA: Diagnosis not present

## 2019-06-14 DIAGNOSIS — Z130 Encounter for screening for diseases of the blood and blood-forming organs and certain disorders involving the immune mechanism: Secondary | ICD-10-CM | POA: Diagnosis not present

## 2019-06-14 DIAGNOSIS — Z79899 Other long term (current) drug therapy: Secondary | ICD-10-CM | POA: Diagnosis not present

## 2019-06-14 DIAGNOSIS — Z5181 Encounter for therapeutic drug level monitoring: Secondary | ICD-10-CM | POA: Diagnosis not present

## 2019-06-14 DIAGNOSIS — Z13228 Encounter for screening for other metabolic disorders: Secondary | ICD-10-CM | POA: Diagnosis not present

## 2019-06-14 DIAGNOSIS — Z131 Encounter for screening for diabetes mellitus: Secondary | ICD-10-CM | POA: Diagnosis not present

## 2019-06-14 DIAGNOSIS — R208 Other disturbances of skin sensation: Secondary | ICD-10-CM | POA: Diagnosis not present

## 2019-06-14 DIAGNOSIS — K582 Mixed irritable bowel syndrome: Secondary | ICD-10-CM | POA: Diagnosis not present

## 2019-06-14 DIAGNOSIS — Z13220 Encounter for screening for lipoid disorders: Secondary | ICD-10-CM | POA: Diagnosis not present

## 2019-06-14 DIAGNOSIS — F29 Unspecified psychosis not due to a substance or known physiological condition: Secondary | ICD-10-CM | POA: Diagnosis not present

## 2019-06-14 DIAGNOSIS — E785 Hyperlipidemia, unspecified: Secondary | ICD-10-CM | POA: Diagnosis not present

## 2019-06-14 DIAGNOSIS — D510 Vitamin B12 deficiency anemia due to intrinsic factor deficiency: Secondary | ICD-10-CM | POA: Diagnosis not present

## 2019-06-14 DIAGNOSIS — E559 Vitamin D deficiency, unspecified: Secondary | ICD-10-CM | POA: Diagnosis not present

## 2019-06-14 DIAGNOSIS — R946 Abnormal results of thyroid function studies: Secondary | ICD-10-CM | POA: Diagnosis not present

## 2019-06-14 DIAGNOSIS — R7309 Other abnormal glucose: Secondary | ICD-10-CM | POA: Diagnosis not present

## 2019-06-14 DIAGNOSIS — I6782 Cerebral ischemia: Secondary | ICD-10-CM | POA: Diagnosis not present

## 2019-07-27 ENCOUNTER — Ambulatory Visit: Payer: Medicare Other | Admitting: Neurology

## 2019-08-22 ENCOUNTER — Ambulatory Visit (INDEPENDENT_AMBULATORY_CARE_PROVIDER_SITE_OTHER): Payer: Medicare Other | Admitting: Family Medicine

## 2019-08-22 ENCOUNTER — Encounter: Payer: Self-pay | Admitting: Family Medicine

## 2019-08-22 ENCOUNTER — Other Ambulatory Visit: Payer: Self-pay

## 2019-08-22 VITALS — BP 142/66 | HR 81 | Temp 97.2°F | Ht 68.0 in | Wt 142.5 lb

## 2019-08-22 DIAGNOSIS — R4189 Other symptoms and signs involving cognitive functions and awareness: Secondary | ICD-10-CM | POA: Diagnosis not present

## 2019-08-22 DIAGNOSIS — F0390 Unspecified dementia without behavioral disturbance: Secondary | ICD-10-CM

## 2019-08-22 DIAGNOSIS — R413 Other amnesia: Secondary | ICD-10-CM | POA: Diagnosis not present

## 2019-08-22 NOTE — Progress Notes (Signed)
This visit occurred during the SARS-CoV-2 public health emergency.  Safety protocols were in place, including screening questions prior to the visit, additional usage of staff PPE, and extensive cleaning of exam room while observing appropriate contact time as indicated for disinfecting solutions.  Memory follow-up.  Previously seen by neurology. "I'm having to go and do everything since my wife died."   Condolences offered about the death of his wife.  "My memory seems to be okay except for all of the new things that I've got to do." I reviewed med list with patient.  "I'm still taking two things for memory" presumably donepezil and namenda.  Med list reviewed with patient.  His watch was 1 hour behind.  It looks like it hadn't been set for daylight savings.  I reset his watch for him.  He is living alone.  He is cooking a little but picking up meals.  He is still doing his chores at baseline, ie laundry, etc. he showed up at the correct time for the office visit based on his watch but that was 1 hour off.  We talked about neurology f/u.    He repeats himself frequently during conversation.    Meds, vitals, and allergies reviewed.   ROS: Per HPI unless specifically indicated in ROS section   GEN: nad, alert and oriented to year month and day. HEENT: ncat NECK: supple w/o LA CV: rrr.  PULM: ctab, no inc wob ABD: soft, +bs EXT: no edema SKIN: no acute rash 3 out of 3 attention, 2 out of 3 on recall.  He has difficulty spelling world backward.  He can do basic math and read a watch. He is pleasant conversation.

## 2019-08-22 NOTE — Patient Instructions (Signed)
Don't change your meds for now.  I'll update the neurology clinic and we'll go from there.  Take care.  Glad to see you.

## 2019-08-25 NOTE — Assessment & Plan Note (Signed)
His memory testing was better today than at previous office visits.  He had difficulty spelling world backward but he still got 2 out of 3 on recall after distracting him.  He was alert and oriented.  He is pleasant conversation but he repeats himself.  I still think he has some memory loss but it does appear to be improved today, compared to previous conversations in the past.  I am going to update neurology about his situation.  I appreciate the help of all involved. At least 30 minutes were devoted to patient care in this encounter (this can potentially include time spent reviewing the patient's file/history, interviewing and examining the patient, counseling/reviewing plan with patient, ordering referrals, ordering tests, reviewing relevant laboratory or x-ray data, and documenting the encounter).

## 2019-08-26 ENCOUNTER — Telehealth: Payer: Self-pay

## 2019-08-26 ENCOUNTER — Other Ambulatory Visit: Payer: Self-pay

## 2019-08-26 NOTE — Telephone Encounter (Signed)
Pt stopped at the clinic with two medication lists. He was quite confused and was asking for help. Pt's wife passed away last year and has always taken care of his medication.  Reviewed the med list and they did not all match the med list in his chart. Contacted pharmacist, Dustin at [facility], and he advised of two additions, olanzapine and lexapro. Pt reports he picked up some new meds but did not know what to do with them so he has just left them on the side until today. Pt reports he keeps up with his meds by grouping the pill bottles on the counter, one group for morning, lunch, supper and night. He reported he added the two new meds this morning but was not sure if he has them correct.  Inquired if he had any family to help. He said he has a son but he would not know his medications and does not keep up with him. He thinks his son does not care about him and says he is estranged.  Pt asked if he could go home and get his bottles. This nurse was worried if he brought his bottles in it would totally confuse him and he would not be able to return the bottles to his system of when he should take them. Asked if pt could use a pill box. He was very confused about that and started to talk about his system of pill bottles again.  Inquired with Leafy Ro, RN, practice admin, for available resources. She contacted the clinic pharmacist and she will go to the pt's house on Monday at 11:30. At that point we will see if pt will need nursing to come in to further help with medication adherence. Advised pt to keep taking meds as he has been and that the pharmacist would be out on Monday. Pt verbalized understanding.  After further discussion with [facility] concerning the pt's hx, the pharmacist would not be the best person to send to the pt's house. [facility] was contacted and they said they could see the pt at the beginning of the week next week.

## 2019-08-26 NOTE — Telephone Encounter (Signed)
Received a letter from pt that was somewhat confusing. It was about medication and his wife passing away. From the note it was unclear if pt needed anything.  Contacted pt and inquired if he needed anything. Pt denies needing anything. Inquired if pt is ok with med refills. Pt denied needing any refills. Advised pt the msg would be give to Dr. Damita Dunnings and if he needed anything to contact the number. The pt wrote the office number down. Pt verbalized understanding.

## 2019-08-26 NOTE — Telephone Encounter (Signed)
Noted. Agree with this. Thanks.  

## 2019-08-26 NOTE — Telephone Encounter (Addendum)
After reviewing patients situation thoroughly, I spoke with [facility] regarding concerns.  I have given them verbal order okay per Dr. Danise Mina to go ahead with services for nursing and social work.  Nurse to contact me on Monday to review further.   Home health referral request for medication management and home safety/social work, nursing support to get involved and see if they can help with any care gaps.   If unsafe, they may determine the need to involve APS but will defer to them for any concerns.  Son is aware and supportive.  Patient also made aware and agreeable to someone coming to the home to help get things back on track.    Thanks.

## 2019-08-28 NOTE — Telephone Encounter (Signed)
Noted. Thanks.

## 2019-08-29 NOTE — Telephone Encounter (Signed)
Spoke with [facility] nurse, Jeffrey [name] today: phone - [phone].   They are starting services with him tomorrow but will need [place] to get him in with Dr. Damita Dunnings in the next 30 days to address memory decline and hopefully gain a dx to support cognitive impairment to support caregiver need.   I will follow up with patient and get him on Dr. Josefine Class schedule in the next couple of weeks for PCP to address.

## 2019-08-30 DIAGNOSIS — F0390 Unspecified dementia without behavioral disturbance: Secondary | ICD-10-CM | POA: Diagnosis not present

## 2019-08-30 DIAGNOSIS — Z79899 Other long term (current) drug therapy: Secondary | ICD-10-CM | POA: Diagnosis not present

## 2019-08-30 DIAGNOSIS — G459 Transient cerebral ischemic attack, unspecified: Secondary | ICD-10-CM | POA: Diagnosis not present

## 2019-08-30 DIAGNOSIS — K5641 Fecal impaction: Secondary | ICD-10-CM | POA: Diagnosis not present

## 2019-08-30 DIAGNOSIS — R413 Other amnesia: Secondary | ICD-10-CM | POA: Diagnosis not present

## 2019-08-30 DIAGNOSIS — Z9181 History of falling: Secondary | ICD-10-CM | POA: Diagnosis not present

## 2019-09-01 DIAGNOSIS — R413 Other amnesia: Secondary | ICD-10-CM | POA: Diagnosis not present

## 2019-09-01 DIAGNOSIS — G459 Transient cerebral ischemic attack, unspecified: Secondary | ICD-10-CM | POA: Diagnosis not present

## 2019-09-01 DIAGNOSIS — K5641 Fecal impaction: Secondary | ICD-10-CM | POA: Diagnosis not present

## 2019-09-01 DIAGNOSIS — Z79899 Other long term (current) drug therapy: Secondary | ICD-10-CM | POA: Diagnosis not present

## 2019-09-01 DIAGNOSIS — Z9181 History of falling: Secondary | ICD-10-CM | POA: Diagnosis not present

## 2019-09-01 DIAGNOSIS — F0390 Unspecified dementia without behavioral disturbance: Secondary | ICD-10-CM | POA: Diagnosis not present

## 2019-09-06 DIAGNOSIS — F0390 Unspecified dementia without behavioral disturbance: Secondary | ICD-10-CM | POA: Diagnosis not present

## 2019-09-06 DIAGNOSIS — R413 Other amnesia: Secondary | ICD-10-CM | POA: Diagnosis not present

## 2019-09-06 DIAGNOSIS — Z79899 Other long term (current) drug therapy: Secondary | ICD-10-CM | POA: Diagnosis not present

## 2019-09-06 DIAGNOSIS — G459 Transient cerebral ischemic attack, unspecified: Secondary | ICD-10-CM | POA: Diagnosis not present

## 2019-09-06 DIAGNOSIS — Z9181 History of falling: Secondary | ICD-10-CM | POA: Diagnosis not present

## 2019-09-06 DIAGNOSIS — K5641 Fecal impaction: Secondary | ICD-10-CM | POA: Diagnosis not present

## 2019-09-19 ENCOUNTER — Ambulatory Visit (INDEPENDENT_AMBULATORY_CARE_PROVIDER_SITE_OTHER): Payer: Medicare Other | Admitting: Family Medicine

## 2019-09-19 ENCOUNTER — Other Ambulatory Visit: Payer: Self-pay

## 2019-09-19 VITALS — BP 118/64 | HR 57 | Temp 96.6°F | Ht 67.0 in | Wt 143.5 lb

## 2019-09-19 DIAGNOSIS — Z79899 Other long term (current) drug therapy: Secondary | ICD-10-CM | POA: Diagnosis not present

## 2019-09-19 DIAGNOSIS — K5909 Other constipation: Secondary | ICD-10-CM | POA: Diagnosis not present

## 2019-09-19 DIAGNOSIS — R4189 Other symptoms and signs involving cognitive functions and awareness: Secondary | ICD-10-CM

## 2019-09-19 DIAGNOSIS — Z9181 History of falling: Secondary | ICD-10-CM | POA: Diagnosis not present

## 2019-09-19 DIAGNOSIS — K5641 Fecal impaction: Secondary | ICD-10-CM | POA: Diagnosis not present

## 2019-09-19 DIAGNOSIS — R413 Other amnesia: Secondary | ICD-10-CM | POA: Diagnosis not present

## 2019-09-19 DIAGNOSIS — G459 Transient cerebral ischemic attack, unspecified: Secondary | ICD-10-CM | POA: Diagnosis not present

## 2019-09-19 MED ORDER — GLYCERIN (ADULT) 2 G RE SUPP: 1.0000 | RECTAL | 0 refills | Status: DC | PRN

## 2019-09-19 NOTE — Assessment & Plan Note (Signed)
PCP is following, but seemed to limit his understanding of plan so feel close follow-up appropriate. He also does not seem to have a lot of social support and with his wife gone, may benefit from seeing if he could have someone to help come to appointments and make sure he understands what he needs to do. Clearly written instructions today which he seemed to understand.

## 2019-09-19 NOTE — Progress Notes (Signed)
Subjective:     Jeffrey [name] is a 86 y.o. male presenting for Constipation (has had small amounts of stool come out and last time was about 1 to 2 days ago. Took ex lax yesterday, has taking OTC stool softners to help. Some adbominal tightness.)     Constipation This is a recurrent problem. The problem has been gradually worsening since onset. The stool is described as watery and blood tinged. The patient is not on a high fiber diet. Associated symptoms include bloating, difficulty urinating, flatus and hematochezia. Pertinent negatives include no diarrhea, fecal incontinence, fever, nausea or vomiting. He has tried laxatives and stool softeners for the symptoms. The treatment provided no relief.   Diet changes - has been eating out more recently  Manual disimpaction in January 2021 - reports he did fine - has been taking a small laxative - told to stop psyllium husk - was only taking stool softeners "as needed" to have a BM  Review of Systems  Constitutional: Negative for fever.  Gastrointestinal: Positive for bloating, constipation, flatus and hematochezia. Negative for diarrhea, nausea and vomiting.  Genitourinary: Positive for difficulty urinating.     Social History   Tobacco Use  Smoking Status Former Smoker  . Years: 5.00  . Types: Cigarettes  . Quit date: 05/05/1958  . Years since quitting: 61.4  Smokeless Tobacco Never Used        Objective:    BP Readings from Last 3 Encounters:  09/19/19 118/64  08/22/19 (!) 142/66  06/13/19 128/64   Wt Readings from Last 3 Encounters:  09/19/19 143 lb 8 oz (65.1 kg)  08/22/19 142 lb 8 oz (64.6 kg)  06/09/19 143 lb 4.8 oz (65 kg)    BP 118/64   Pulse (!) 57   Temp (!) 96.6 F (35.9 C)   Ht 5\' 7"  (1.702 m)   Wt 143 lb 8 oz (65.1 kg)   SpO2 100%   BMI 22.48 kg/m    Physical Exam Constitutional:      Appearance: Normal appearance. He is not ill-appearing or diaphoretic.  HENT:     Right Ear: External  ear normal.     Left Ear: External ear normal.     Nose: Nose normal.  Eyes:     General: No scleral icterus.    Extraocular Movements: Extraocular movements intact.     Conjunctiva/sclera: Conjunctivae normal.  Cardiovascular:     Rate and Rhythm: Normal rate and regular rhythm.  Pulmonary:     Effort: Pulmonary effort is normal.     Breath sounds: Normal breath sounds.  Abdominal:     General: Abdomen is flat. Bowel sounds are normal. There is no distension.     Palpations: Abdomen is soft.     Tenderness: There is no abdominal tenderness. There is no guarding or rebound.  Musculoskeletal:     Cervical back: Neck supple.  Skin:    General: Skin is warm and dry.  Neurological:     Mental Status: He is alert. Mental status is at baseline.  Psychiatric:        Mood and Affect: Mood normal.           Assessment & Plan:   Problem List Items Addressed This Visit      Digestive   Chronic constipation - Primary    Reviewed KUB from 05/2019 with significant stool burden. Cognitive impairment made history difficult, though it seems he has only been taking stool softeners prn since his  disimpaction. When initially asked about suppository he said no, then later thought yes, then again thought no. Given that it seemed he confused, decided to trial suppository to see if that would provide symptoms relief. Return for disimpaction if not resolved in 2-3 days. Follow-up with PCP in 2-3 weeks primarily to make sure he is continuing to follow treatment plan for chronic constipation to avoid future office visits for disimpaction.       Relevant Medications   glycerin adult 2 g suppository     Other   Cognitive impairment    PCP is following, but seemed to limit his understanding of plan so feel close follow-up appropriate. He also does not seem to have a lot of social support and with his wife gone, may benefit from seeing if he could have someone to help come to appointments and make sure  he understands what he needs to do. Clearly written instructions today which he seemed to understand.           Return in about 3 days (around 09/22/2019), or if symptoms worsen or fail to improve.  Lesleigh Noe, MD

## 2019-09-19 NOTE — Patient Instructions (Addendum)
Constipation   1) Pick up suppository and try this today 2) Can take daily as needed 3) If no relief or worsening symptoms return Wednesday or Thursday 4) Continue taking stool softener (Colace) every day --- You should take enough colace or miralax to have regular soft bowel movements    What you can do to treat your symptoms 1) Fiber -- Eat more fiber rich foods: beans, broccoli, berries, avocados, popcorn, pear/apple, green peas, turnip greens, brussels sprouts, whole grains (barley, bran, quinoa, oatmeal) -- Could also eat Prunes daily  2) Hydration  -- Drink more water: Try to drink 64 oz of water per day  3) Exercise -- Moderate exercise (walking, jogging, biking) for 30 minutes, 5 days a week  4) Dedicate time for Bowel movements - do not delay  5) Stool Softener  - Docusate Sodium (Colace) 100 mg daily or twice daily as needed    Treating chronic constipation is often about finding the right amount of medication and fiber to keep you regular and comfortable. For some people that may be daily metamucil and colace every other day. For others it may be Metamucil and colace twice daily and Miralax 3 times a week. The goal is to go slow and listen to your body. And normal can be anywhere from 2-3 soft bowel movements a day to 1 bowel movement every 2-3 days.

## 2019-09-19 NOTE — Assessment & Plan Note (Addendum)
Reviewed KUB from 05/2019 with significant stool burden. Cognitive impairment made history difficult, though it seems he has only been taking stool softeners prn since his disimpaction. When initially asked about suppository he said no, then later thought yes, then again thought no. Given that it seemed he confused, decided to trial suppository to see if that would provide symptoms relief. Return for disimpaction if not resolved in 2-3 days. Follow-up with PCP in 2-3 weeks primarily to make sure he is continuing to follow treatment plan for chronic constipation to avoid future office visits for disimpaction.

## 2019-09-21 ENCOUNTER — Telehealth: Payer: Self-pay | Admitting: Family Medicine

## 2019-09-21 DIAGNOSIS — K5641 Fecal impaction: Secondary | ICD-10-CM | POA: Diagnosis not present

## 2019-09-21 DIAGNOSIS — R413 Other amnesia: Secondary | ICD-10-CM | POA: Diagnosis not present

## 2019-09-21 DIAGNOSIS — F0390 Unspecified dementia without behavioral disturbance: Secondary | ICD-10-CM | POA: Diagnosis not present

## 2019-09-21 DIAGNOSIS — G459 Transient cerebral ischemic attack, unspecified: Secondary | ICD-10-CM | POA: Diagnosis not present

## 2019-09-21 DIAGNOSIS — Z9181 History of falling: Secondary | ICD-10-CM | POA: Diagnosis not present

## 2019-09-21 DIAGNOSIS — Z79899 Other long term (current) drug therapy: Secondary | ICD-10-CM | POA: Diagnosis not present

## 2019-09-21 NOTE — Telephone Encounter (Signed)
Please check with patient's son.  Patient has a history of pulmonary nodules noted and there is a consideration for follow-up CT if the patient is up to it at this point.  Given all that he is going on I do not know if it would be appropriate to proceed with the repeat imaging.  I would like input from his son.  We may need to get the patient back in the clinic to discuss options.  The patient may need help getting to the imaging site.  Thanks.

## 2019-09-23 NOTE — Telephone Encounter (Signed)
Spoke with son [name]) who says the patient will probably do fine with the CT but at the last scan, they said nothing had really changed.  Son says he will be happy to take the patient for the scan.  Does patient need an OV here at the clinic first?

## 2019-09-23 NOTE — Telephone Encounter (Signed)
Left detailed message on voicemail of son [name])

## 2019-09-25 NOTE — Telephone Encounter (Signed)
30 min OV would likely be useful.  If patient wants to do this as a phone/virtual visit then that is okay with me.  Either way (in person visit or other), would likely be helpful for patient's son to be involved.  Thanks.

## 2019-09-26 ENCOUNTER — Other Ambulatory Visit: Payer: Self-pay

## 2019-09-26 ENCOUNTER — Encounter: Payer: Self-pay | Admitting: Internal Medicine

## 2019-09-26 ENCOUNTER — Ambulatory Visit (INDEPENDENT_AMBULATORY_CARE_PROVIDER_SITE_OTHER): Payer: Medicare Other | Admitting: Internal Medicine

## 2019-09-26 DIAGNOSIS — K625 Hemorrhage of anus and rectum: Secondary | ICD-10-CM | POA: Diagnosis not present

## 2019-09-26 HISTORY — DX: Hemorrhage of anus and rectum: K62.5

## 2019-09-26 MED ORDER — HYDROCORTISONE 2.5 % EX CREA
TOPICAL_CREAM | Freq: Three times a day (TID) | CUTANEOUS | 3 refills | Status: DC | PRN
Start: 2019-09-26 — End: 2019-10-25

## 2019-09-26 NOTE — Telephone Encounter (Signed)
LVM for pt's son

## 2019-09-26 NOTE — Progress Notes (Signed)
Subjective:    Patient ID: Jeffrey [name], male    DOB: October 25, 1928, 86 y.o.   MRN: [id number]  HPI  Here again for the constipation This visit occurred during the SARS-CoV-2 public health emergency.  Safety protocols were in place, including screening questions prior to the visit, additional usage of staff PPE, and extensive cleaning of exam room while observing appropriate contact time as indicated for disinfecting solutions.   Ongoing issues with constipation Has tried stool softeners--"it is breaking me down" but still has some bleeding Having soreness in perirectal area  Feels the constipation goes back a while---but he only states a week  No abdominal pain Has uses anything for the rectal area  Current Outpatient Medications on File Prior to Visit  Medication Sig Dispense Refill  . acetaminophen (TYLENOL) 650 MG CR tablet Take 1,300 mg by mouth every 8 (eight) hours as needed for pain.    Marland Kitchen aspirin (BAYER ASPIRIN EC LOW DOSE) 81 MG EC tablet Take 81 mg by mouth daily.      . Citicoline (COGNITIVE HEALTH) 500 MG CAPS Take by mouth.    . Coenzyme Q10 (COQ-10) 100 MG CAPS Take 200 mg by mouth daily.     Marland Kitchen docusate sodium (COLACE) 100 MG capsule Take 1 capsule (100 mg total) by mouth 2 (two) times daily. 60 capsule 3  . donepezil (ARICEPT) 10 MG tablet Take 1 tablet (10 mg total) by mouth at bedtime. 90 tablet 1  . escitalopram (LEXAPRO) 20 MG tablet Take 20 mg by mouth every morning.    . loratadine (CLARITIN) 10 MG tablet Take 10 mg by mouth daily.     . memantine (NAMENDA) 10 MG tablet Take 1 tablet (10 mg total) by mouth 2 (two) times daily. 60 tablet 10  . Multiple Vitamins-Minerals (MULTIVITAL) tablet Take 1 tablet by mouth daily.      Marland Kitchen NASAL SPRAY SALINE NA Place 1 spray into both nostrils as needed (Congestion).    . OLANZapine (ZYPREXA) 5 MG tablet Take 5 mg by mouth at bedtime.    . pantoprazole (PROTONIX) 40 MG tablet TAKE 1 TABLET BY MOUTH IN THE MORNING 30 tablet 11   . polyethylene glycol powder (MIRALAX) 17 GM/SCOOP powder Take 17 g by mouth daily. 850 g 1  . pravastatin (PRAVACHOL) 40 MG tablet Take 1 tablet (40 mg total) by mouth daily. 90 tablet 3  . simethicone (MYLICON) 0000000 MG chewable tablet Chew 125 mg by mouth every 6 (six) hours as needed for flatulence.    Marland Kitchen glycerin adult 2 g suppository Place 1 suppository rectally as needed for constipation. (Patient not taking: Reported on 09/26/2019) 12 suppository 0   No current facility-administered medications on file prior to visit.    Allergies  Allergen Reactions  . Flonase [Fluticasone Propionate] Other (See Comments)    Nosebleeds.    . Tamsulosin     REACTION: flushed and arms felt stiff    Past Medical History:  Diagnosis Date  . Adenomatous polyp of colon 1995    w/ HGD  . Allergic rhinitis   . BPH (benign prostatic hyperplasia)   . Diverticulosis 09/21/2000  . ED (erectile dysfunction)   . Hyperlipemia 05/05/1988  . Memory loss   . Schamberg's purpura    2012- eval by Dr. Allyson Sabal  . Sebaceous cyst   . Skin cancer 05/05/1998   hx of Scalp-melanoma  . TIA (transient ischemic attack)     Past Surgical History:  Procedure Laterality Date  .  Carotid U/S Nml  09/08/2007  . Colon polyp  2009/07/31   B9 Int Hemms  . Colonoscopy polyps  10/05/2003   Divertics in hemms  . COLONOSCOPY W/ POLYPECTOMY  08/1993; 09/21/2000;10/05/2003   B9  . Echo (other)  09/08/2007   nml lvf EF 50-55% Mild Diast Dysfctn Triv Ar  . melanoma scalp  August 01, 2003  . SEPTOPLASTY  1995  . Skin revision vertex of scalp  08-01-07   Dr. Luetta Nutting, [facility]  . TONSILLECTOMY      Family History  Problem Relation Age of Onset  . Rectal cancer Father   . Colon cancer Paternal Aunt   . Other Paternal Aunt        cancer on back of neck  . Prostate cancer Neg Hx     Social History   Socioeconomic History  . Marital status: Married    Spouse name: Not on file  . Number of children: 1  . Years of education: Not on file  . Highest  education level: Bachelor's degree (e.g., BA, AB, BS)  Occupational History  . Occupation: retired-  lab [employer]    Employer: RETIRED  Tobacco Use  . Smoking status: Former Smoker    Years: 5.00    Types: Cigarettes    Quit date: 05/05/1958    Years since quitting: 61.4  . Smokeless tobacco: Never Used  Substance and Sexual Activity  . Alcohol use: No  . Drug use: No  . Sexual activity: Not on file  Other Topics Concern  . 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    Artist- paints/charcoal   Left handed   Social Determinants of Health   Financial Resource Strain: Low Risk   . Difficulty of Paying Living Expenses: Not hard at all  Food Insecurity: No Food Insecurity  . Worried About Charity fundraiser in the Last Year: Never true  . Ran Out of Food in the Last Year: Never true  Transportation Needs: No Transportation Needs  . Lack of Transportation (Medical): No  . Lack of Transportation (Non-Medical): No  Physical Activity: Insufficiently Active  . Days of Exercise per Week: 7 days  . Minutes of Exercise per Session: 10 min  Stress: No Stress Concern Present  . Feeling of Stress : Not at all  Social Connections:   . Frequency of Communication with Friends and Family:   . Frequency of Social Gatherings with Friends and Family:   . Attends Religious Services:   . Active Member of Clubs or Organizations:   . Attends Archivist Meetings:   Marland Kitchen Marital Status:   Intimate Partner Violence: Not At Risk  . Fear of Current or Ex-Partner: No  . Emotionally Abused: No  . Physically Abused: No  . Sexually Abused: No   Review of Systems  Appetite is good Weight is stable No N/V     Objective:   Physical Exam  GI: Soft. He exhibits no distension. There is  no abdominal tenderness. There is no rebound and no guarding.  Genitourinary:    Genitourinary Comments: Mild perirectal inflammation. Small non thrombosed hemorrhoid Large amount soft brown stool in rectum--heme negative            Assessment & Plan:

## 2019-09-26 NOTE — Assessment & Plan Note (Signed)
Appears to be perirectal irritation and a small hemorrhoid Will try 2.5% HC cream for the irritation Suggested change to 2 senna-s for his bowels (instead of just the stool softener) Don't think GI evaluation is needed at this point--no worrisome features for mass or IBD (stool is heme negative)

## 2019-09-26 NOTE — Patient Instructions (Signed)
Please stop the stool softener and take senna-s ---2 tabs daily instead. Try the prescription cream for the soreness around your rectum

## 2019-09-27 NOTE — Telephone Encounter (Signed)
[name] contacted the office back. He states there have ben lots of issues going on with his dads dementia, and it is causing issues between their relationship. The son states that patient gets very agitated and mean, and the son is fearful for himself, as well as his father. Tim states that he does check on his father and tries to keep in contact with him, but the patient acts out when he is around or tries to make contact, and the son does not know how to approach this, as well as keep him up with these appointments and scans.  Tim would like Dr. Damita Dunnings to give him a call personally to discuss this when he has a moment. He can be reached at [phone].

## 2019-09-29 DIAGNOSIS — G459 Transient cerebral ischemic attack, unspecified: Secondary | ICD-10-CM | POA: Diagnosis not present

## 2019-09-29 DIAGNOSIS — Z9181 History of falling: Secondary | ICD-10-CM | POA: Diagnosis not present

## 2019-09-29 DIAGNOSIS — F0390 Unspecified dementia without behavioral disturbance: Secondary | ICD-10-CM | POA: Diagnosis not present

## 2019-09-29 DIAGNOSIS — K5641 Fecal impaction: Secondary | ICD-10-CM | POA: Diagnosis not present

## 2019-09-29 DIAGNOSIS — Z79899 Other long term (current) drug therapy: Secondary | ICD-10-CM | POA: Diagnosis not present

## 2019-09-29 DIAGNOSIS — R413 Other amnesia: Secondary | ICD-10-CM | POA: Diagnosis not present

## 2019-09-29 NOTE — Telephone Encounter (Signed)
Called and LMOVM, no confidential info.  Asked him to call back at the clinic.

## 2019-09-30 NOTE — Telephone Encounter (Signed)
[name] Returned your call   Best C/b# [phone]

## 2019-10-03 NOTE — Telephone Encounter (Signed)
Late entry.  I talked with the patient's son on 09/30/2019.  I thanked him for taking my call.  We talked about the patient's situation.  The patient has a tendency to get more agitated if his son is actively involved.  It is worth stating for the record that I have no evidence of any ill will or maltreatment from the son directed at the patient.  In my experience his son has been supportive and helpful to try to care for the patient.  The patient's memory difficulties complicates the situation.  We thought it was most appropriate for [place] to check with home health to at least get a copy of his current medications as he has been taking them at home and also see what other information we can get about his current situation at home.  We will go from there.  Lugene-please check with home health.  See if they can get a report about how the patient has been taking his medications and his situation at home/his status.  Thanks.

## 2019-10-06 DIAGNOSIS — F0390 Unspecified dementia without behavioral disturbance: Secondary | ICD-10-CM | POA: Diagnosis not present

## 2019-10-06 DIAGNOSIS — Z9181 History of falling: Secondary | ICD-10-CM | POA: Diagnosis not present

## 2019-10-06 DIAGNOSIS — G459 Transient cerebral ischemic attack, unspecified: Secondary | ICD-10-CM | POA: Diagnosis not present

## 2019-10-06 DIAGNOSIS — K5641 Fecal impaction: Secondary | ICD-10-CM | POA: Diagnosis not present

## 2019-10-06 DIAGNOSIS — Z79899 Other long term (current) drug therapy: Secondary | ICD-10-CM | POA: Diagnosis not present

## 2019-10-06 DIAGNOSIS — R413 Other amnesia: Secondary | ICD-10-CM | POA: Diagnosis not present

## 2019-10-06 NOTE — Telephone Encounter (Signed)
Spoke with receptionist at [facility] who says the patient was discharged today but it was referred to [facility].  I asked for a copy of the note and it was faxed and in Dr. Josefine Class In Box.

## 2019-10-07 NOTE — Telephone Encounter (Signed)
I will await update from [facility].

## 2019-10-24 ENCOUNTER — Telehealth: Payer: Self-pay

## 2019-10-24 NOTE — Telephone Encounter (Signed)
Contacted [facility] and spoke with [employer]. She reported pt was d/c from [facility] on 6/3 due to no longer needing service. She reports they cannot keep the pt on [facility] just for medication assistance. She reports the pt's social worker, Holland Commons has now made a referral to [facility]. There is no update from [facility] and Millie will be out of the office all week. Number for Holland Commons is [phone]

## 2019-10-24 NOTE — Telephone Encounter (Signed)
Pt walked in with constipation; pts bowels not moving without take a laxative for probably 1 year. Pt said last normal BM was approx 1 yr ago. When pt has problem with constipation pt takes  Equate gentle laxative but when pt has urge to have BM pt does not have a lot of time to get to bathroom. Pt had BM 2-3 days ago after taking laxative and BM was soft with no blood or pus. Pt has no N&V, no fever and no problem urinating. Pt has no covid symptoms, no travel and no known exposure to + covid. Pt has been taking all prescribed meds as instructed. Since pts wife died 01-27-19 pts diet has changed to mostly fast food. Pt also has a lot of stress taking care of his home and taking care of bills. Pt lives alone and really misses his wife. Pt said his only son from his first marriage, Tim who lives in [place] does not help pt and pt found out 2 - 4 wks ago [name] accused pt of making threats and the police came by pts home and drove him to [place] where he stayed 8 days, pt is having family issues with his only son. Pt does not have CP,SOB,H/A,dizziness, or weakness. Pt did fall 3-4 wks ago in his bedroom after tripping with no known injury except scraped lt upper abd and under lt arm. Pt saw Dr Silvio Pate for constipation 09/26/19. Pt said he is in no distress at this time and has already scheduled in office appt to see Dr Damita Dunnings on 10/25/19 at 9 AM. Pt will bring his meds with him to this appt. UC & ED precautions given and pt voiced understanding.  FYI to Dr Damita Dunnings.

## 2019-10-24 NOTE — Telephone Encounter (Signed)
Noted. Thanks. Will see tomorrow.  

## 2019-10-25 ENCOUNTER — Other Ambulatory Visit: Payer: Self-pay

## 2019-10-25 ENCOUNTER — Encounter: Payer: Self-pay | Admitting: Family Medicine

## 2019-10-25 ENCOUNTER — Ambulatory Visit (INDEPENDENT_AMBULATORY_CARE_PROVIDER_SITE_OTHER): Payer: Medicare Other | Admitting: Family Medicine

## 2019-10-25 DIAGNOSIS — K5909 Other constipation: Secondary | ICD-10-CM | POA: Diagnosis not present

## 2019-10-25 DIAGNOSIS — R413 Other amnesia: Secondary | ICD-10-CM | POA: Diagnosis not present

## 2019-10-25 NOTE — Patient Instructions (Signed)
I would try taking miralax daily.  That is likely the safest option to get your bowels moving.   If you keep having trouble then let me know.  Take care.  Glad to see you.

## 2019-10-25 NOTE — Progress Notes (Signed)
This visit occurred during the SARS-CoV-2 public health emergency.  Safety protocols were in place, including screening questions prior to the visit, additional usage of staff PPE, and extensive cleaning of exam room while observing appropriate contact time as indicated for disinfecting solutions.  Med list d/w pt.   He is off zyprexa and miralax but he is still on baseline meds o/w.    Current Outpatient Medications on File Prior to Visit  Medication Sig Dispense Refill  . aspirin (BAYER ASPIRIN EC LOW DOSE) 81 MG EC tablet Take 81 mg by mouth daily.      . Coenzyme Q10 (COQ-10) 100 MG CAPS Take 200 mg by mouth daily.     Marland Kitchen donepezil (ARICEPT) 10 MG tablet Take 1 tablet (10 mg total) by mouth at bedtime. 90 tablet 1  . loratadine (CLARITIN) 10 MG tablet Take 10 mg by mouth daily.     . Multiple Vitamins-Minerals (MULTIVITAL) tablet Take 1 tablet by mouth daily.      . pantoprazole (PROTONIX) 40 MG tablet TAKE 1 TABLET BY MOUTH IN THE MORNING 30 tablet 11  . polyethylene glycol powder (MIRALAX) 17 GM/SCOOP powder Take 17 g by mouth daily. (Patient taking differently: Take 17 g by mouth daily as needed. ) 850 g 1  . pravastatin (PRAVACHOL) 40 MG tablet Take 1 tablet (40 mg total) by mouth daily. 90 tablet 3  . sennosides-docusate sodium (SENOKOT-S) 8.6-50 MG tablet Take 2 tablets by mouth daily as needed.     . simethicone (MYLICON) 782 MG chewable tablet Chew 125 mg by mouth in the morning, at noon, and at bedtime.     Marland Kitchen OLANZapine (ZYPREXA) 5 MG tablet Take 5 mg by mouth at bedtime. (Patient not taking: Reported on 10/25/2019)     No current facility-administered medications on file prior to visit.    We talked about constipation.  He would have a time w/o BM then have fecal urgency and a BM after med use.  He is off miralax in the meantime.  No fevers.  No blood in stool.  No abd pain.  He has some miralax at his house.    Mood d/w pt.  Living situation d/w pt.  Still on donepezil but off  namenda.  He is still living at home and "it's been terrible since my wife died."  He told me that he had a lawyer but that her estate isn't settled.    He is also worried that some of his paper have been moved around.  We talked about his prev IVC.  Per patient "[name] (his son) had told people I had been threatening people."  He reports these details to me and then he repeats items in the midst of the history.  He reports he had hired another Chief Executive Officer in the meantime.  He is living alone now.  He is eating most meals out of the house since he isn't cooking much.  No SI/HI.    We talked about his memory, with retesting today.   He knows the year but the month is difficult for him to recall- he does recall June eventually.  He had to check his watch to know it was the 22nd.   3/3 attention.  1/3 recall.   He knows Barbette Or is Software engineer.    Meds, vitals, and allergies reviewed.   ROS: Per HPI unless specifically indicated in ROS section   GEN: nad, alert and oriented to person but he has difficulty with date. HEENT: ncat  NECK: supple w/o LA CV: rrr PULM: ctab, no inc wob ABD: soft, +bs EXT: no edema SKIN: no acute rash  At least 30 minutes were devoted to patient care in this encounter (this can potentially include time spent reviewing the patient's file/history, interviewing and examining the patient, counseling/reviewing plan with patient, ordering referrals, ordering tests, reviewing relevant laboratory or x-ray data, and documenting the encounter).

## 2019-10-27 ENCOUNTER — Other Ambulatory Visit: Payer: Self-pay | Admitting: Neurology

## 2019-10-27 NOTE — Assessment & Plan Note (Signed)
The issue remains about his previous memory loss and his situation at home. At this point there is no imminent danger to himself or anyone else and it appears that he is okay for outpatient follow-up. All to consider options in the meantime. We did not change his medications at this point.

## 2019-10-27 NOTE — Assessment & Plan Note (Signed)
Discussed using MiraLAX daily as needed and he'll update me as needed. This is likely the best option in terms of medications for now. Discussed. He agreed.

## 2019-11-02 ENCOUNTER — Other Ambulatory Visit: Payer: Self-pay | Admitting: *Deleted

## 2019-11-02 MED ORDER — DONEPEZIL HCL 10 MG PO TABS: 10.0000 mg | ORAL_TABLET | Freq: Every day | ORAL | 1 refills | Status: DC

## 2020-01-04 ENCOUNTER — Telehealth: Payer: Self-pay | Admitting: Family Medicine

## 2020-01-04 NOTE — Telephone Encounter (Signed)
I received a letter from Jeffrey [name] who is a lawyer representing this patient requesting a call back.  He also sent a general power of attorney where the patient had designated Jeffrey. Jeffrey [name] as his agent.  I called Jeffrey. Jeffrey [name] back and had a pleasant conversation.  Jeffrey [name] is concerned about the patient's memory and thought it would be beneficial for the patient to have follow-up here in the clinic.  We will work to arrange this.  I did not disclose any specific or confidential information in this conversation.  I did thank him for his effort and he said he appreciated the phone call.

## 2020-01-04 NOTE — Telephone Encounter (Signed)
Please talk to me about this before calling the patient.  Please call patient and see about setting up a 30-minute office visit for a checkup.  Thanks.

## 2020-01-06 NOTE — Telephone Encounter (Addendum)
Appointment scheduled. Patient says he has a list of his medications but he doesn't have the bottles to bring with him.  He has a CPX scheduled in early October.  Do you still want to see him before that?

## 2020-01-09 NOTE — Telephone Encounter (Signed)
I would like to see him prior to October and please have him bring any bottles or list that he has.  Thanks.

## 2020-01-10 NOTE — Telephone Encounter (Signed)
He is scheduled for Thursday, 01/12/2020.

## 2020-01-11 NOTE — Telephone Encounter (Signed)
Noted. Thanks.

## 2020-01-12 ENCOUNTER — Encounter: Payer: Self-pay | Admitting: Family Medicine

## 2020-01-12 ENCOUNTER — Ambulatory Visit (INDEPENDENT_AMBULATORY_CARE_PROVIDER_SITE_OTHER): Payer: Medicare Other | Admitting: Family Medicine

## 2020-01-12 ENCOUNTER — Other Ambulatory Visit: Payer: Self-pay

## 2020-01-12 VITALS — BP 122/54 | HR 68 | Temp 96.6°F | Ht 67.0 in | Wt 138.5 lb

## 2020-01-12 DIAGNOSIS — R413 Other amnesia: Secondary | ICD-10-CM

## 2020-01-12 LAB — COMPREHENSIVE METABOLIC PANEL
ALT: 18 U/L (ref 0–53)
AST: 23 U/L (ref 0–37)
Albumin: 4.2 g/dL (ref 3.5–5.2)
Alkaline Phosphatase: 63 U/L (ref 39–117)
BUN: 13 mg/dL (ref 6–23)
CO2: 26 mEq/L (ref 19–32)
Calcium: 9.1 mg/dL (ref 8.4–10.5)
Chloride: 105 mEq/L (ref 96–112)
Creatinine, Ser: 0.99 mg/dL (ref 0.40–1.50)
GFR: 71.19 mL/min (ref >60.00)
Glucose, Bld: 83 mg/dL (ref 70–99)
Potassium: 4.3 mEq/L (ref 3.5–5.1)
Sodium: 139 mEq/L (ref 135–145)
Total Bilirubin: 0.5 mg/dL (ref 0.2–1.2)
Total Protein: 6.3 g/dL (ref 6.0–8.3)

## 2020-01-12 LAB — CBC WITH DIFFERENTIAL/PLATELET
Basophils Absolute: 0.1 10*3/uL (ref 0.0–0.1)
Basophils Relative: 1 % (ref 0.0–3.0)
Eosinophils Absolute: 0.1 10*3/uL (ref 0.0–0.7)
Eosinophils Relative: 1.3 % (ref 0.0–5.0)
HCT: 42.6 % (ref 39.0–52.0)
Hemoglobin: 14.7 g/dL (ref 13.0–17.0)
Lymphocytes Relative: 22.2 % (ref 12.0–46.0)
Lymphs Abs: 1.6 10*3/uL (ref 0.7–4.0)
MCHC: 34.5 g/dL (ref 30.0–36.0)
MCV: 93.6 fl (ref 78.0–100.0)
Monocytes Absolute: 0.8 10*3/uL (ref 0.1–1.0)
Monocytes Relative: 10.9 % (ref 3.0–12.0)
Neutro Abs: 4.6 10*3/uL (ref 1.4–7.7)
Neutrophils Relative %: 64.6 % (ref 43.0–77.0)
Platelets: 182 10*3/uL (ref 150.0–400.0)
RBC: 4.56 Mil/uL (ref 4.22–5.81)
RDW: 13.3 % (ref 11.5–15.5)
WBC: 7.2 10*3/uL (ref 4.0–10.5)

## 2020-01-12 LAB — TSH: TSH: 1.62 u[IU]/mL (ref 0.35–4.50)

## 2020-01-12 NOTE — Progress Notes (Signed)
This visit occurred during the SARS-CoV-2 public health emergency.  Safety protocols were in place, including screening questions prior to the visit, additional usage of staff PPE, and extensive cleaning of exam room while observing appropriate contact time as indicated for disinfecting solutions.  Memory follow up. Living at home.  Wife had died, d/w pt. "My lawyer called me yesterday and told me I needed assistance."  He called the police yesterday.  He is worried about others stealing from him.  He is tangential in conversation and repeats himself.  He is worried about laywers stealing from him and his "papers being gone."  "Somebody opened and emptied my lock box."  He does not make threats to anyone at this point and does not have plans for self-harm or harm directed towards anyone specifically.  Per patient, he would want his sister designated if patient were incapacitated.  [employer], in [place], [place].    He began an unprompted conversation about conspiracies related to Covid vaccination, where vaccination was not useful, "they do not work".  He is of the opinion that [employer] is a valid source for medical information.  According to the patient this network is broadcasting that patients should not get vaccinated.  Per patient, current med list is accurate.    Current Outpatient Medications on File Prior to Visit  Medication Sig Dispense Refill  . aspirin (BAYER ASPIRIN EC LOW DOSE) 81 MG EC tablet Take 81 mg by mouth daily.      . Coenzyme Q10 (COQ-10) 100 MG CAPS Take 200 mg by mouth 3 (three) times daily.     Marland Kitchen donepezil (ARICEPT) 10 MG tablet Take 1 tablet (10 mg total) by mouth at bedtime. 90 tablet 1  . lactase (LACTAID) 3000 units tablet Take by mouth daily.    Marland Kitchen loratadine (CLARITIN) 10 MG tablet Take 10 mg by mouth daily.     . Multiple Vitamins-Minerals (MULTIVITAL) tablet Take 1 tablet by mouth daily.      . pantoprazole (PROTONIX) 40 MG tablet TAKE 1 TABLET  BY MOUTH IN THE MORNING 30 tablet 11  . pravastatin (PRAVACHOL) 40 MG tablet Take 1 tablet (40 mg total) by mouth daily. 90 tablet 3  . simethicone (MYLICON) 539 MG chewable tablet Chew 125 mg by mouth in the morning, at noon, and at bedtime.     . polyethylene glycol powder (MIRALAX) 17 GM/SCOOP powder Take 17 g by mouth daily. (Patient not taking: Reported on 10/25/2019) 850 g 1   No current facility-administered medications on file prior to visit.   Meds, vitals, and allergies reviewed.   ROS: Per HPI unless specifically indicated in ROS section   nad ncat Neck supple, no LA rrr ctab abd soft, not ttp Ext w/o edema.   Gait symmetric He is pleasant conversation but he requires redirection  Memory testing done.  He thought it was August 9th, then corrected to September 9th.  Oriented to year on repeated questioning.  But tangential in conversation and needs redirection. 3/3 attention.   Can do math and read a clock.  2/3 recall.  "WOURLD"---> "DL no I can't"    Prev CT with  1. Stable generalized cerebral atrophy. 2. No acute intracranial abnormality.

## 2020-01-12 NOTE — Patient Instructions (Addendum)
Go to the lab on the way out.   If you have mychart we'll likely use that to update you.    Don't change your meds for now.  Let me see what I can set up to get you some help.  Take care.  Glad to see you.

## 2020-01-18 ENCOUNTER — Telehealth: Payer: Self-pay | Admitting: Family Medicine

## 2020-01-18 NOTE — Assessment & Plan Note (Signed)
He has memory trouble with short-term recall but according to patient he is able to validate and comply with his current med list.  There is no active threat either to or from the patient that I know of.  I advised him to get good medical advice from reliable medical sources not just broadcast TV.  He is worried that others are stealing from him.  I do not have any evidence to support this.  He does not appear to be in a situation where he would necessitate involuntary commitment or referral to Adult Protective Services.  According the patient, he does not trust his son.  We opted not to change his medications at this point, check his labs, and we will consider options in the meantime.  At least 30 minutes were devoted to patient care in this encounter (this can potentially include time spent reviewing the patient's file/history, interviewing and examining the patient, counseling/reviewing plan with patient, ordering referrals, ordering tests, reviewing relevant laboratory or x-ray data, and documenting the encounter).

## 2020-01-18 NOTE — Telephone Encounter (Signed)
Note opened in error.

## 2020-01-19 ENCOUNTER — Ambulatory Visit (INDEPENDENT_AMBULATORY_CARE_PROVIDER_SITE_OTHER): Payer: Medicare Other | Admitting: Family Medicine

## 2020-01-19 ENCOUNTER — Telehealth: Payer: Self-pay | Admitting: Family Medicine

## 2020-01-19 ENCOUNTER — Other Ambulatory Visit: Payer: Self-pay

## 2020-01-19 ENCOUNTER — Encounter: Payer: Self-pay | Admitting: Family Medicine

## 2020-01-19 VITALS — BP 116/58 | HR 73 | Temp 96.9°F | Ht 67.0 in | Wt 139.1 lb

## 2020-01-19 DIAGNOSIS — L84 Corns and callosities: Secondary | ICD-10-CM | POA: Diagnosis not present

## 2020-01-19 DIAGNOSIS — L0390 Cellulitis, unspecified: Secondary | ICD-10-CM | POA: Insufficient documentation

## 2020-01-19 DIAGNOSIS — L03312 Cellulitis of back [any part except buttock]: Secondary | ICD-10-CM

## 2020-01-19 MED ORDER — DOXYCYCLINE HYCLATE 100 MG PO TABS
100.0000 mg | ORAL_TABLET | Freq: Two times a day (BID) | ORAL | 0 refills | Status: DC
Start: 2020-01-19 — End: 2020-01-26

## 2020-01-19 NOTE — Telephone Encounter (Signed)
Tim returned your call.

## 2020-01-19 NOTE — Progress Notes (Signed)
Subjective:    Patient ID: Jeffrey [name], male    DOB: 1928/12/13, 86 y.o.   MRN: [id number]  This visit occurred during the SARS-CoV-2 public health emergency.  Safety protocols were in place, including screening questions prior to the visit, additional usage of staff PPE, and extensive cleaning of exam room while observing appropriate contact time as indicated for disinfecting solutions.    HPI 86 yo pt of Dr Damita Dunnings (h/o dementia symptoms/memory change) Presents for a skin area of concern on back and dry skin on feet  Wt Readings from Last 3 Encounters:  01/19/20 139 lb 1 oz (63.1 kg)  01/12/20 138 lb 8 oz (62.8 kg)  10/25/19 138 lb 9 oz (62.9 kg)   21.78 kg/m  Feet- very dry skin  Uses cetaphil moisturizing cream  Also uses on back and other areas  Toe nails give him problems  Thick and discolored  Has some calluses  He cut the medial R 2nd toe today   He has a sore area on his back  May have bumped it /sitting in chair- lower back  A little swollen  ? 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 Cellulitis 01/19/2020  . Rectal bleeding 09/26/2019  . Chronic constipation 09/19/2019  . Callus of foot 03/30/2019  . Nasal sinus congestion 03/08/2019  . Lung nodule 02-24-2019  . Death of wife Feb 24, 2019  . Esophageal stenosis 01/09/2017  . Shoulder pain, right 08/24/2016  . Memory change 01/03/2016  . Left ankle pain 11/05/2015  . Advance care planning 12/29/2013  . Allergic rhinitis 12/23/2012  . Medicare annual wellness visit, subsequent 12/22/2011  . History of melanoma 09/22/2011  . Knee pain 08/13/2011  . Paresthesia of foot 08/13/2011  . ED (erectile dysfunction) 11/14/2010  . Schamberg's purpura 08/01/2010  . EXTERNAL HEMORRHOIDS 05/02/2010  . HYPERTROPHY  PROSTATE W/UR OBST & OTH LUTS 10/09/2009  . Fecal impaction in rectum ([place]) 01/03/2009  . ELEVATED BLOOD PRESSURE WITHOUT DIAGNOSIS OF HYPERTENSION 10/03/2008  . TRANSIENT ISCHEMIC ATTACK 08/24/2007  . COLONIC POLYPS, HX OF 08/24/2006  . DIVERTICULOSIS, COLON 09/21/2000  . HLD (hyperlipidemia) 05/05/1988   Past Medical History:  Diagnosis Date  . Adenomatous polyp of colon 1995    w/ HGD  . Allergic rhinitis   . BPH (benign prostatic hyperplasia)   . Diverticulosis 09/21/2000  . ED (erectile dysfunction)   . Hyperlipemia 05/05/1988  . Memory loss   . Schamberg's purpura    2012- eval by Dr. Allyson Sabal  . Sebaceous cyst   . Skin cancer 05/05/1998   hx of Scalp-melanoma  . TIA (transient ischemic attack)    Past Surgical History:  Procedure Laterality Date  . Carotid U/S Nml  09/08/2007  . Colon polyp  2011   B9 Int Hemms  . Colonoscopy polyps  10/05/2003   Divertics in hemms  . COLONOSCOPY W/ POLYPECTOMY  08/1993; 09/21/2000;10/05/2003   B9  . Echo (other)  09/08/2007   nml lvf EF 50-55% Mild Diast Dysfctn Triv Ar  . melanoma scalp  07/2003  . SEPTOPLASTY  1995  . Skin revision vertex of scalp  2009   Dr. Luetta Nutting, [place] History   Tobacco Use  . Smoking status: Former  Smoker    Years: 5.00    Types: Cigarettes    Quit date: 05/05/1958    Years since quitting: 61.7  . Smokeless tobacco: Never Used  Vaping Use  . Vaping Use: Never used  Substance Use Topics  . Alcohol use: No  . Drug use: No   Family History  Problem Relation Age of Onset  . Rectal cancer Father   . Colon cancer Paternal Aunt   . Other Paternal Aunt        cancer on back of neck  . Prostate cancer Neg Hx    Allergies  Allergen Reactions  . Flonase [Fluticasone Propionate] Other (See Comments)    Nosebleeds.    . Tamsulosin     REACTION: flushed and arms felt stiff   Current Outpatient Medications on File Prior to Visit  Medication Sig Dispense Refill  . aspirin (BAYER ASPIRIN EC  LOW DOSE) 81 MG EC tablet Take 81 mg by mouth daily.      . Coenzyme Q10 (COQ-10) 100 MG CAPS Take 200 mg by mouth 3 (three) times daily.     Marland Kitchen donepezil (ARICEPT) 10 MG tablet Take 1 tablet (10 mg total) by mouth at bedtime. 90 tablet 1  . lactase (LACTAID) 3000 units tablet Take by mouth daily.    Marland Kitchen loratadine (CLARITIN) 10 MG tablet Take 10 mg by mouth daily.     . Multiple Vitamins-Minerals (MULTIVITAL) tablet Take 1 tablet by mouth daily.      . pantoprazole (PROTONIX) 40 MG tablet TAKE 1 TABLET BY MOUTH IN THE MORNING 30 tablet 11  . polyethylene glycol powder (MIRALAX) 17 GM/SCOOP powder Take 17 g by mouth daily. 850 g 1  . pravastatin (PRAVACHOL) 40 MG tablet Take 1 tablet (40 mg total) by mouth daily. 90 tablet 3  . simethicone (MYLICON) 161 MG chewable tablet Chew 125 mg by mouth in the morning, at noon, and at bedtime.      No current facility-administered medications on file prior to visit.    Review of Systems  Constitutional: Negative for activity change, appetite change, fatigue, fever and unexpected weight change.  HENT: Negative for congestion, rhinorrhea, sore throat and trouble swallowing.   Eyes: Negative for pain, redness, itching and visual disturbance.  Respiratory: Negative for cough, chest tightness, shortness of breath and wheezing.   Cardiovascular: Negative for chest pain and palpitations.  Gastrointestinal: Negative for abdominal pain, blood in stool, constipation, diarrhea and nausea.  Endocrine: Negative for cold intolerance, heat intolerance, polydipsia and polyuria.  Genitourinary: Negative for difficulty urinating, dysuria, frequency and urgency.  Musculoskeletal: Negative for arthralgias, joint swelling and myalgias.  Skin: Positive for color change. Negative for pallor, rash and wound.       ? wound  Neurological: Negative for dizziness, tremors, weakness, numbness and headaches.  Hematological: Negative for adenopathy. Does not bruise/bleed easily.    Psychiatric/Behavioral: Negative for decreased concentration and dysphoric mood. The patient is not nervous/anxious.        Objective:   Physical Exam Constitutional:      General: He is not in acute distress.    Appearance: Normal appearance. He is normal weight. He is not ill-appearing.  Eyes:     Conjunctiva/sclera: Conjunctivae normal.     Pupils: Pupils are equal, round, and reactive to light.  Cardiovascular:     Rate and Rhythm: Normal rate and regular rhythm.     Pulses: Normal pulses.  Pulmonary:     Effort: No respiratory distress.  Musculoskeletal:     Cervical back: Normal range of motion.  Skin:    General: Skin is warm and dry.     Comments: 4 by 6 cm area of erythema and induration over mid back  Firm area in the center is consistent with sebaceous cyst  Not fluctuant and no drainage Mild tenderness  Skin on feet is mildly dry  No maceration between toes  Callus between R 1, 2nd toes is abraded and recently bled    Neurological:     Mental Status: He is alert.     Sensory: No sensory deficit.  Psychiatric:        Mood and Affect: Mood normal.           Assessment & Plan:   Problem List Items Addressed This Visit      Musculoskeletal and Integument   Callus of foot    Between first and 2nd toes on R foot (medial 2nd toe)-appears he tried to cut callus and it is abraded  Recommend soap/water cleanse and abx oint of choice Recommend a toe cushion/pad/separator to help off load pressure  He was px doxycycline for another infection that should help prevent infx        Other   Cellulitis - Primary    6 by 4 cm oval area of bright redness and induration-mid back  Suspect there is a seb cyst within it  Not draining or fluctuant  tx with doxycycline inst soap and water cleanse /protect and obs  F/u planned with pcp  He may eventually need a seb cyst removed (may have had this before per pt) inst warm compress / call if worse before f/u and watch  for drainage

## 2020-01-19 NOTE — Patient Instructions (Addendum)
You have a skin infection on your back  (there may be a cyst that has become infected)  Keep clean with soap and water  Try not to bump it  It if drains-that is ok /use gauze  A gentle warm compress may also help for 10-15 minutes at a time  Take the doxycycline (antibiotic) twice daily - go get it now and start it right away   For foot- keep clean with soap and water You can use an antibiotic ointment like polysporin or triple antibiotic on that toe callus that was cut  Also look for a a toe pad or separator to protect the area   Follow up with Dr Damita Dunnings next for a re check

## 2020-01-19 NOTE — Assessment & Plan Note (Signed)
Between first and 2nd toes on R foot (medial 2nd toe)-appears he tried to cut callus and it is abraded  Recommend soap/water cleanse and abx oint of choice Recommend a toe cushion/pad/separator to help off load pressure  He was px doxycycline for another infection that should help prevent infx

## 2020-01-19 NOTE — Assessment & Plan Note (Addendum)
6 by 4 cm oval area of bright redness and induration-mid back  Suspect there is a seb cyst within it  Not draining or fluctuant  tx with doxycycline inst soap and water cleanse /protect and obs  F/u planned with pcp  He may eventually need a seb cyst removed (may have had this before per pt) inst warm compress / call if worse before f/u and watch for drainage

## 2020-01-20 NOTE — Telephone Encounter (Signed)
Late entry.  I talked to [name], the patient's son, yesterday.  Tim would like to help out but the patient reports not trusting him.  Tim reports that his father has been hesitant to let him help at home/etc.  I do not have any evidence of Tim doing anything that is unhelpful for malicious to the patient.  It appears that the patient has conspiracy theories, where he believes that others are conspiring against him.  I have no evidence that the patient's concerns are true.  Unfortunately it is possible for a patient to have a belief in false conspiracy theories but still be competent to make their own medical decisions (example, a patient can decline vaccination due to the false belief that they are not effective).  This appears to be the case here where the patient does have some memory loss but is still able to function at a high enough level to apparently make his own decisions.  In this particular case if the patient is resistant to getting help here at the clinic, then I am unable at this point to make significant changes in his care plan.  It appears that observation is the best option at this point.  I explained to the son that I have to work within guidelines regarding patient autonomy and since there is no direct threat toward or from the patient, observation is reasonable for now.  Patient's son understood and thank me for the call.  He will update me as needed.

## 2020-01-23 NOTE — Telephone Encounter (Signed)
Please talk to me about this when possible.  Thanks.

## 2020-01-23 NOTE — Telephone Encounter (Signed)
Melinda from attorney's office called asking to speak with Dr. Damita Dunnings for follow up. Practice admin [facility]) has been made aware and will discuss this further with Dr. Damita Dunnings prior to him returning call to attorney. Melinda left [phone] as callback number.

## 2020-01-24 ENCOUNTER — Encounter: Payer: Self-pay | Admitting: Family Medicine

## 2020-01-24 ENCOUNTER — Telehealth: Payer: Self-pay

## 2020-01-24 ENCOUNTER — Ambulatory Visit (INDEPENDENT_AMBULATORY_CARE_PROVIDER_SITE_OTHER): Payer: Medicare Other | Admitting: Family Medicine

## 2020-01-24 DIAGNOSIS — L089 Local infection of the skin and subcutaneous tissue, unspecified: Secondary | ICD-10-CM | POA: Diagnosis not present

## 2020-01-24 DIAGNOSIS — L723 Sebaceous cyst: Secondary | ICD-10-CM

## 2020-01-24 NOTE — Patient Instructions (Signed)
Keep the area covered.   Leave the packing in for now.  Keep the appointment in 2 days.  We can remove the packing at that point.   Keep taking your antibiotics.  Update [place] as needed.  Take care.  Glad to see you.

## 2020-01-24 NOTE — Telephone Encounter (Signed)
Pt came to clinic today c/o back pain x 3 days which is caused from cyst which was treated on 9/16 by Dr. Glori Bickers with doxycycline, BID, #14. Pt reports he has not been able to sit or sleep due to the pain and he cannot take the pain any longer. He rates the pain a 10 on a scale of 0-10 and is requesting an apt with PCP or he reports he will go to the ER. Pt also reports exudate and warm to touch area from cyst.  Pt has also reports it is hard for him to remember anything since his wife died 1 yr ago and that his lawyers are trying to steal everything he has and what his wife had. Also that his son, [name], has been stealing from him also. Pt has brought these accusations up in past conversations at this office.  Pt reports also that he contacted the office yesterday about 4pm c/o of the pain but no one called him back. He cannot remember who he talked to. He also said he had an apt a few days ago and cannot remember who he had that apt with. Advised pt that there is not a note in his chart that he contacted the office and that his last apt was with Dr. Glori Bickers on 9/16. He said he was prescribed some pain medicine for his back pain but he cannot remember the name. No pain medications were on his med list or med hx. Inquired if the medication was an abx and pt confirmed the med was an abx and that he still has one day left to take.  Advised pt this nurse would discuss his concerns of back pain with Dr. Damita Dunnings.  Discussed with Dr. Damita Dunnings and pt was placed on his schedule. Pt was seen by Dr. Damita Dunnings.

## 2020-01-24 NOTE — Progress Notes (Signed)
This visit occurred during the SARS-CoV-2 public health emergency.  Safety protocols were in place, including screening questions prior to the visit, additional usage of staff PPE, and extensive cleaning of exam room while observing appropriate contact time as indicated for disinfecting solutions.  He mentioned that he was concerned about his lawyer/son, as we discussed before.  His back was a more pressing issue so we tabled further conversation to address the issues below.  He was in agreement with that and he was grateful for the help regarding his back.  It was a pleasant conversation with the patient.  He has already been on doxycycline but has not yet finished his course.  No fevers.  No other complaints other than local symptoms with his back.  I&D  Meds, vitals, and allergies reviewed.   Indication: suspect abscess  Pt complaints of: erythema, pain, swelling  Location: Lower back.  Size: 8x14cm area of erythema with central 4 cm central inflamed sebaceous cyst.  Informed consent obtained.  Pt aware of risks not limited to but including infection, bleeding, damage to near by organs.  Prep: etoh/betadine  Anesthesia: 1%lidocaine with epi, good effect  Incision made with #11 blade  Wound explored and loculations removed  Wound packed with iodoform gauze  Tolerated well

## 2020-01-25 ENCOUNTER — Encounter: Payer: Self-pay | Admitting: Family Medicine

## 2020-01-25 DIAGNOSIS — L089 Local infection of the skin and subcutaneous tissue, unspecified: Secondary | ICD-10-CM | POA: Insufficient documentation

## 2020-01-25 DIAGNOSIS — L723 Sebaceous cyst: Secondary | ICD-10-CM | POA: Insufficient documentation

## 2020-01-25 HISTORY — DX: Local infection of the skin and subcutaneous tissue, unspecified: L08.9

## 2020-01-25 NOTE — Assessment & Plan Note (Signed)
Incision and drainage done without complication.  Tolerated well.  He clearly felt better Routine postprocedure instructions d/w pt. he will try to leave the packing in for now and follow-up with me in 48 hours.  Continue his doxycycline prescription for now.  Keep area clean and bandaged, follow up if concerns/spreading erythema/pain.  He agrees with plan.  Okay for outpatient follow-up.

## 2020-01-25 NOTE — Telephone Encounter (Signed)
Thanks.  See OV note.

## 2020-01-26 ENCOUNTER — Other Ambulatory Visit: Payer: Self-pay

## 2020-01-26 ENCOUNTER — Ambulatory Visit (INDEPENDENT_AMBULATORY_CARE_PROVIDER_SITE_OTHER): Payer: Medicare Other | Admitting: Family Medicine

## 2020-01-26 ENCOUNTER — Encounter: Payer: Self-pay | Admitting: Family Medicine

## 2020-01-26 VITALS — BP 114/62 | HR 71 | Temp 96.1°F | Ht 67.0 in | Wt 138.0 lb

## 2020-01-26 DIAGNOSIS — L03312 Cellulitis of back [any part except buttock]: Secondary | ICD-10-CM

## 2020-01-26 MED ORDER — DOXYCYCLINE HYCLATE 100 MG PO TABS
100.0000 mg | ORAL_TABLET | Freq: Two times a day (BID) | ORAL | 0 refills | Status: DC
Start: 2020-01-26 — End: 2020-02-01

## 2020-01-26 NOTE — Progress Notes (Signed)
This visit occurred during the SARS-CoV-2 public health emergency.  Safety protocols were in place, including screening questions prior to the visit, additional usage of staff PPE, and extensive cleaning of exam room while observing appropriate contact time as indicated for disinfecting solutions.  Recheck cellulitis.  Packing removed from sebaceous cyst today at office visit.  Still on doxycycline.  No fevers.  He does not feel unwell.  Meds, vitals, and allergies reviewed.   ROS: Per HPI unless specifically indicated in ROS section   nad ncat rrr ctab Back with 6 x 9 cm area of erythema.  The erythema is less pronounced compared to presentation at previous office visit.  The total area that is affected is smaller from last office visit.  The sebaceous site is still draining some serosanguineous fluid but not tender.  Area dressed per routine but not needed to repack at this point.

## 2020-01-26 NOTE — Telephone Encounter (Signed)
Dr. Damita Dunnings and I have spoken multiple times over past couple of days regarding patient and this situation.

## 2020-01-26 NOTE — Patient Instructions (Signed)
I'll check on getting home health set up.  Keep taking doxycycline.  I want to recheck you early next week.  Take care.  Glad to see you.

## 2020-01-29 ENCOUNTER — Other Ambulatory Visit: Payer: Self-pay | Admitting: Family Medicine

## 2020-01-29 DIAGNOSIS — E785 Hyperlipidemia, unspecified: Secondary | ICD-10-CM

## 2020-01-29 NOTE — Assessment & Plan Note (Signed)
Improved.  The lesion is still draining which is appropriate.  Area dressed.  Continue doxycycline.  New prescription sent.  He consented to home health.  Ordered.  Recheck early next week.  Routine cautions given to patient.  He agrees with plan.  He does feel better compared to previous office visit.

## 2020-01-31 ENCOUNTER — Ambulatory Visit: Payer: Medicare Other

## 2020-01-31 ENCOUNTER — Telehealth: Payer: Self-pay

## 2020-01-31 ENCOUNTER — Telehealth: Payer: Self-pay | Admitting: *Deleted

## 2020-01-31 ENCOUNTER — Other Ambulatory Visit: Payer: Self-pay

## 2020-01-31 ENCOUNTER — Other Ambulatory Visit (INDEPENDENT_AMBULATORY_CARE_PROVIDER_SITE_OTHER): Payer: Medicare Other

## 2020-01-31 DIAGNOSIS — E785 Hyperlipidemia, unspecified: Secondary | ICD-10-CM

## 2020-01-31 LAB — LIPID PANEL
Cholesterol: 135 mg/dL (ref 0–200)
HDL: 58.4 mg/dL (ref >39.00)
LDL Cholesterol: 44 mg/dL (ref 0–99)
NonHDL: 76.34
Total CHOL/HDL Ratio: 2
Triglycerides: 160 mg/dL — ABNORMAL HIGH (ref 0.0–149.0)
VLDL: 32 mg/dL (ref 0.0–40.0)

## 2020-01-31 NOTE — Telephone Encounter (Signed)
Did you catch the part that he was not fasting for his labs?

## 2020-01-31 NOTE — Telephone Encounter (Signed)
Yes, we'll get the results and go from there.

## 2020-01-31 NOTE — Telephone Encounter (Signed)
If he can take the bandage off then proceed.  If not then it can get done tomorrow at the visit.  Thanks.

## 2020-01-31 NOTE — Telephone Encounter (Signed)
Noted. Thanks.

## 2020-01-31 NOTE — Telephone Encounter (Signed)
Okay I will check it tomorrow morning at the appt

## 2020-01-31 NOTE — Telephone Encounter (Signed)
Pt called Triage saying that no one has looked at his back since his surgery and he was here for labs today and he thought this appt was for a recheck of his surgery site. I asked pt what surgery he was talking about he said Dr. Damita Dunnings removed a cyst (01/19/20) and no one has looked at it since his I&D. I did advise pt I see a recheck on 01/26/20, pt said he didn't remember coming in for that appt but he said since that last appt he hasn't changed bandage or had anyone look at it and hasn't had it cleaned at all. Spoke with Dr. Damita Dunnings and he said place pt in 1st available appt with another provider since we are booked for today and he is off tomorrow. appt scheduled with Dr. Silvio Pate tomorrow at [place] pt cancelled his AWV phone call today with nurse because he said he wants to only focus on wound check for now, I did advise pt he should keep his AWV with Dr. Damita Dunnings next week (02/07/20) because he may want to look at his wound again also.

## 2020-01-31 NOTE — Telephone Encounter (Signed)
Patient concerned abt the bandage on his back. Wants to know how long should he leave the bandage on? Please call patient. Ok to leave vmail.  Patient also had a fasting lab today. Says he was unaware he wasn't suppose to eat.

## 2020-02-01 ENCOUNTER — Other Ambulatory Visit: Payer: Self-pay

## 2020-02-01 ENCOUNTER — Ambulatory Visit (INDEPENDENT_AMBULATORY_CARE_PROVIDER_SITE_OTHER): Payer: Medicare Other | Admitting: Internal Medicine

## 2020-02-01 ENCOUNTER — Telehealth: Payer: Self-pay | Admitting: *Deleted

## 2020-02-01 ENCOUNTER — Encounter: Payer: Self-pay | Admitting: Internal Medicine

## 2020-02-01 DIAGNOSIS — L03312 Cellulitis of back [any part except buttock]: Secondary | ICD-10-CM | POA: Diagnosis not present

## 2020-02-01 MED ORDER — AMOXICILLIN-POT CLAVULANATE 875-125 MG PO TABS: 1.0000 | ORAL_TABLET | Freq: Two times a day (BID) | ORAL | 1 refills | Status: DC

## 2020-02-01 NOTE — Progress Notes (Signed)
Subjective:    Patient ID: Jeffrey [name], male    DOB: [birth year], 86 y.o.   MRN: [id number]  HPI Here for follow up of infected cyst on back and cellulitis This visit occurred during the SARS-CoV-2 public health emergency.  Safety protocols were in place, including screening questions prior to the visit, additional usage of staff PPE, and extensive cleaning of exam room while observing appropriate contact time as indicated for disinfecting solutions.   Had I&D on 9/21 Then recheck 9/23 Has left original bandage on Hasn't showered since then  No obvious drainage Some soreness and tenderness now Not really painful  Current Outpatient Medications on File Prior to Visit  Medication Sig Dispense Refill  . aspirin (BAYER ASPIRIN EC LOW DOSE) 81 MG EC tablet Take 81 mg by mouth daily.      . Coenzyme Q10 (COQ-10) 100 MG CAPS Take 200 mg by mouth 3 (three) times daily.     Marland Kitchen donepezil (ARICEPT) 10 MG tablet Take 1 tablet (10 mg total) by mouth at bedtime. 90 tablet 1  . doxycycline (VIBRA-TABS) 100 MG tablet Take 1 tablet (100 mg total) by mouth 2 (two) times daily. 14 tablet 0  . lactase (LACTAID) 3000 units tablet Take by mouth daily.    Marland Kitchen loratadine (CLARITIN) 10 MG tablet Take 10 mg by mouth daily.     . Multiple Vitamins-Minerals (MULTIVITAL) tablet Take 1 tablet by mouth daily.      . pantoprazole (PROTONIX) 40 MG tablet TAKE 1 TABLET BY MOUTH IN THE MORNING 30 tablet 11  . polyethylene glycol powder (MIRALAX) 17 GM/SCOOP powder Take 17 g by mouth daily. 850 g 1  . pravastatin (PRAVACHOL) 40 MG tablet Take 1 tablet (40 mg total) by mouth daily. 90 tablet 3  . simethicone (MYLICON) 062 MG chewable tablet Chew 125 mg by mouth in the morning, at noon, and at bedtime.      No current facility-administered medications on file prior to visit.    Allergies  Allergen Reactions  . Flonase [Fluticasone Propionate] Other (See Comments)    Nosebleeds.    . Tamsulosin     REACTION:  flushed and arms felt stiff    Past Medical History:  Diagnosis Date  . Adenomatous polyp of colon 1995    w/ HGD  . Allergic rhinitis   . BPH (benign prostatic hyperplasia)   . Diverticulosis 09/21/2000  . ED (erectile dysfunction)   . Hyperlipemia 05/05/1988  . Memory loss   . Schamberg's purpura    2012- eval by Dr. Allyson Sabal  . Sebaceous cyst   . Skin cancer 05/05/1998   hx of Scalp-melanoma  . TIA (transient ischemic attack)     Past Surgical History:  Procedure Laterality Date  . Carotid U/S Nml  09/08/2007  . Colon polyp  2011   B9 Int Hemms  . Colonoscopy polyps  10/05/2003   Divertics in hemms  . COLONOSCOPY W/ POLYPECTOMY  08/1993; 09/21/2000;10/05/2003   B9  . Echo (other)  09/08/2007   nml lvf EF 50-55% Mild Diast Dysfctn Triv Ar  . melanoma scalp  07/2003  . SEPTOPLASTY  1995  . Skin revision vertex of scalp  2009   Dr. Luetta Nutting, [facility]  . TONSILLECTOMY      Family History  Problem Relation Age of Onset  . Rectal cancer Father   . Colon cancer Paternal Aunt   . Other Paternal Aunt        cancer on back of neck  .  Prostate cancer Neg Hx     Social History   Socioeconomic History  . Marital status: Married    Spouse name: Not on file  . Number of children: 1  . Years of education: Not on file  . Highest education level: Bachelor's degree (e.g., BA, AB, BS)  Occupational History  . Occupation: retired-  lab [employer]    Employer: RETIRED  Tobacco Use  . Smoking status: Former Smoker    Years: 5.00    Types: Cigarettes    Quit date: 05/05/1958    Years since quitting: 61.7  . Smokeless tobacco: Never Used  Vaping Use  . Vaping Use: Never used  Substance and Sexual Activity  . Alcohol use: No  . Drug use: No  . Sexual activity: Not on file  Other Topics Concern  . 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    Artist- paints/charcoal   Left handed   Social Determinants of Health   Financial Resource Strain:   . Difficulty of Paying Living Expenses: Not on file  Food Insecurity:   . Worried About Charity fundraiser in the Last Year: Not on file  . Ran Out of Food in the Last Year: Not on file  Transportation Needs:   . Lack of Transportation (Medical): Not on file  . Lack of Transportation (Non-Medical): Not on file  Physical Activity:   . Days of Exercise per Week: Not on file  . Minutes of Exercise per Session: Not on file  Stress:   . Feeling of Stress : Not on file  Social Connections:   . Frequency of Communication with Friends and Family: Not on file  . Frequency of Social Gatherings with Friends and Family: Not on file  . Attends Religious Services: Not on file  . Active Member of Clubs or Organizations: Not on file  . Attends Archivist Meetings: Not on file  . Marital Status: Not on file  Intimate Partner Violence:   . Fear of Current or Ex-Partner: Not on file  . Emotionally Abused: Not on file  . Physically Abused: Not on file  . Sexually Abused: Not on file   Review of Systems  No fever Doesn't feel sick     Objective:   Physical Exam Skin:    Comments: Still with raised inflamed area on mid lower back ~20mm open area---just slight whitish discharge Redness spreads out at least 6cm with warmth and tenderness             Assessment & Plan:

## 2020-02-01 NOTE — Telephone Encounter (Signed)
Patient left a voicemail stating that he was seen today. Patient stated that the doctor put a piece of tape on his back. Patient wants to know what he should do if he continues to have problems? Patient stated that he is not sure how he is to care for the area that the tape was put over.

## 2020-02-01 NOTE — Assessment & Plan Note (Signed)
Still infected The cyst has not healed over yet due to this Will stop the doxy Change to augmentin for a week and then recheck

## 2020-02-02 ENCOUNTER — Ambulatory Visit (INDEPENDENT_AMBULATORY_CARE_PROVIDER_SITE_OTHER): Payer: Medicare Other | Admitting: Family Medicine

## 2020-02-02 ENCOUNTER — Encounter: Payer: Self-pay | Admitting: Family Medicine

## 2020-02-02 DIAGNOSIS — Z8673 Personal history of transient ischemic attack (TIA), and cerebral infarction without residual deficits: Secondary | ICD-10-CM | POA: Diagnosis not present

## 2020-02-02 DIAGNOSIS — N529 Male erectile dysfunction, unspecified: Secondary | ICD-10-CM | POA: Diagnosis not present

## 2020-02-02 DIAGNOSIS — K5790 Diverticulosis of intestine, part unspecified, without perforation or abscess without bleeding: Secondary | ICD-10-CM | POA: Diagnosis not present

## 2020-02-02 DIAGNOSIS — Z85828 Personal history of other malignant neoplasm of skin: Secondary | ICD-10-CM | POA: Diagnosis not present

## 2020-02-02 DIAGNOSIS — R413 Other amnesia: Secondary | ICD-10-CM

## 2020-02-02 DIAGNOSIS — J309 Allergic rhinitis, unspecified: Secondary | ICD-10-CM | POA: Diagnosis not present

## 2020-02-02 DIAGNOSIS — Z7982 Long term (current) use of aspirin: Secondary | ICD-10-CM | POA: Diagnosis not present

## 2020-02-02 DIAGNOSIS — L089 Local infection of the skin and subcutaneous tissue, unspecified: Secondary | ICD-10-CM | POA: Diagnosis not present

## 2020-02-02 DIAGNOSIS — N400 Enlarged prostate without lower urinary tract symptoms: Secondary | ICD-10-CM | POA: Diagnosis not present

## 2020-02-02 DIAGNOSIS — Z4800 Encounter for change or removal of nonsurgical wound dressing: Secondary | ICD-10-CM | POA: Diagnosis not present

## 2020-02-02 DIAGNOSIS — L03312 Cellulitis of back [any part except buttock]: Secondary | ICD-10-CM | POA: Diagnosis not present

## 2020-02-02 DIAGNOSIS — Z9181 History of falling: Secondary | ICD-10-CM | POA: Diagnosis not present

## 2020-02-02 DIAGNOSIS — Z87891 Personal history of nicotine dependence: Secondary | ICD-10-CM | POA: Diagnosis not present

## 2020-02-02 DIAGNOSIS — E785 Hyperlipidemia, unspecified: Secondary | ICD-10-CM | POA: Diagnosis not present

## 2020-02-02 DIAGNOSIS — L817 Pigmented purpuric dermatosis: Secondary | ICD-10-CM | POA: Diagnosis not present

## 2020-02-02 DIAGNOSIS — Z9089 Acquired absence of other organs: Secondary | ICD-10-CM | POA: Diagnosis not present

## 2020-02-02 DIAGNOSIS — L723 Sebaceous cyst: Secondary | ICD-10-CM | POA: Diagnosis not present

## 2020-02-02 NOTE — Progress Notes (Signed)
This visit occurred during the SARS-CoV-2 public health emergency.  Safety protocols were in place, including screening questions prior to the visit, additional usage of staff PPE, and extensive cleaning of exam room while observing appropriate contact time as indicated for disinfecting solutions.  Cellulitis f/u.  Seen yesterday.  abx changed to augmentin.  No fevers.  Walked in for recheck.  Worked into clinic schedule. He complains of patient but lesion isn't ttp on exam and that is clearly better compared to presentation.  Clearly less redness, smaller area affected from prior exam and less irritated appearing.  Clearly better from initial presentation.  Minimal drainage.  Bandage changed.    "My memory is fine." d/w pt. he reportedly did not remember coming in for the initial recheck when I remove the packing.  We talked about it and he began talking about his log your and his son and other issues.  I redirected him back to his medical record which I showed him about the previous rechecks.  I told him that his back was better and we talked about routine follow-up here.  Meds, vitals, and allergies reviewed.   ROS: Per HPI unless specifically indicated in ROS section   nad He perseverates on his memory being fine and his lawyer not helping him.  He is redirected during the exam.   Back with 6x6cm mild erythema with central lesion, minimal drainage.  Redressed today.  Not ttp.

## 2020-02-02 NOTE — Telephone Encounter (Signed)
Let him know that I just put on a bandaid. He should keep that on till it falls off---then he should be able to just leave it alone. It is okay to take a shower

## 2020-02-02 NOTE — Patient Instructions (Signed)
Your back clearly looks better. I would continue augmenting and we'll check on getting home health set up to recheck the spot.   Take care.  Glad to see you.

## 2020-02-03 ENCOUNTER — Telehealth: Payer: Self-pay | Admitting: Family Medicine

## 2020-02-03 NOTE — Telephone Encounter (Signed)
Called son back. He said it was an automated call. He had seen a missed call and called [place] back.  Called pt. Advised him what Dr Silvio Pate said about the wound care.

## 2020-02-03 NOTE — Telephone Encounter (Signed)
Home Health Verbal Orders - Caller/Agency: [facility] Number: [phone] Requesting OT/PT/Skilled Nursing/Social Work/Speech Therapy: Skilled Nursing for wound care Frequency: 1w1, 2w4, 1w4, with 2PRN

## 2020-02-03 NOTE — Telephone Encounter (Signed)
Jeffrey [name] (son) returned your call [facility] number [phone]

## 2020-02-03 NOTE — Telephone Encounter (Signed)
Please give the order.  Thanks.   

## 2020-02-06 NOTE — Assessment & Plan Note (Addendum)
Clearly improved.  Cover with a bandage.  Continue Augmentin.  We can recheck here in the clinic.  We are getting home health set up to help with monitoring/recheck.

## 2020-02-06 NOTE — Telephone Encounter (Signed)
Verbal orders given to [employer] as instructed by telephone.

## 2020-02-06 NOTE — Assessment & Plan Note (Signed)
He perseverates regarding his lawyer and family.  He was redirected.  We can address this at follow-up.  Per patient, he does not believe he has any memory loss.

## 2020-02-07 ENCOUNTER — Other Ambulatory Visit: Payer: Self-pay

## 2020-02-07 ENCOUNTER — Ambulatory Visit (INDEPENDENT_AMBULATORY_CARE_PROVIDER_SITE_OTHER): Payer: Medicare Other | Admitting: Family Medicine

## 2020-02-07 ENCOUNTER — Encounter: Payer: Self-pay | Admitting: Family Medicine

## 2020-02-07 VITALS — BP 126/58 | HR 72 | Temp 96.0°F | Ht 67.0 in | Wt 138.4 lb

## 2020-02-07 DIAGNOSIS — L089 Local infection of the skin and subcutaneous tissue, unspecified: Secondary | ICD-10-CM | POA: Diagnosis not present

## 2020-02-07 DIAGNOSIS — R413 Other amnesia: Secondary | ICD-10-CM

## 2020-02-07 DIAGNOSIS — Z0000 Encounter for general adult medical examination without abnormal findings: Secondary | ICD-10-CM

## 2020-02-07 DIAGNOSIS — E785 Hyperlipidemia, unspecified: Secondary | ICD-10-CM | POA: Diagnosis not present

## 2020-02-07 DIAGNOSIS — L723 Sebaceous cyst: Secondary | ICD-10-CM

## 2020-02-07 DIAGNOSIS — Z7189 Other specified counseling: Secondary | ICD-10-CM

## 2020-02-07 MED ORDER — PRAVASTATIN SODIUM 40 MG PO TABS
40.0000 mg | ORAL_TABLET | Freq: Every day | ORAL | 3 refills | Status: DC
Start: 2020-02-07 — End: 2020-02-10

## 2020-02-07 NOTE — Progress Notes (Signed)
This visit occurred during the SARS-CoV-2 public health emergency.  Safety protocols were in place, including screening questions prior to the visit, additional usage of staff PPE, and extensive cleaning of exam room while observing appropriate contact time as indicated for disinfecting solutions.  I have personally reviewed the Medicare Annual Wellness questionnaire and have noted 1. The patient's medical and social history 2. Their use of alcohol, tobacco or illicit drugs 3. Their current medications and supplements 4. The patient's functional ability including ADL's, fall risks, home safety risks and hearing or visual             impairment. 5. Diet and physical activities 6. Evidence for depression or mood disorders  The patients weight, height, BMI have been recorded in the chart and visual acuity is per eye clinic.  I have made referrals, counseling and provided education to the patient based review of the above and I have provided the pt with a written personalized care plan for preventive services.  Provider list updated- see scanned forms.  Routine anticipatory guidance given to patient.  See health maintenance. The possibility exists that previously documented standard health maintenance information may have been brought forward from a previous encounter into this note.  If needed, that same information has been updated to reflect the current situation based on today's encounter.    Flu declined by patient Shingles previously done with Zostavax. PNA up-to-date Tetanus 2006, discussed with patient. Covid vaccine declined by patient. Colon cancer screening not due given his age. Prostate cancer screening not due given his age. Advance directive- Sister Delton [place] designated patient were incapacitated. Cognitive function addressed- see scanned forms- and if abnormal then additional documentation follows.   Elevated Cholesterol: Using medications without problems:  yes Muscle aches: no Diet compliance: d/w pt.  Exercise: limited, d/w pt.  Labs d/w pt.  On pravastatin.    Cellulitis/infected sebaceous cyst.  We set up [facility].  Per patient they came to his house.  Still on augmentin per patient report.  He has less pain.  No fevers.  Still on Augmentin.  Memory loss.  See scanned forms.  He perseverates that his son and his lawyer are not helping him.  He is not oriented to the year or the month.  He does know that today is Tuesday but he thinks it is February 1971.  He has 1 out of 3 short-term recall.  He can still do basic math and read a watch face.  He denies that he has any memory loss.  PMH and SH reviewed  Meds, vitals, and allergies reviewed.   ROS: Per HPI.  Unless specifically indicated otherwise in HPI, the patient denies:  General: fever. Eyes: acute vision changes ENT: sore throat Cardiovascular: chest pain Respiratory: SOB GI: vomiting GU: dysuria Musculoskeletal: acute back pain Derm: acute rash Neuro: acute motor dysfunction Psych: worsening mood Endocrine: polydipsia Heme: bleeding Allergy: hayfever  GEN: nad, alert HEENT: ncat NECK: supple w/o LA CV: rrr. PULM: ctab, no inc wob ABD: soft, +bs EXT: no edema SKIN: no acute rash but he has resolving cellulitis on his back.  He clearly looks better than before.  He has a pinkish rim around the edge of the cyst but he has a 3 x 4 cm central area that is not tender and has minimal drainage.  This is darker than the surrounding area but overall improved compared to presentation and from last office visit.

## 2020-02-07 NOTE — Patient Instructions (Signed)
Your back looks better.  Finish the antibiotics.   Take care.  Glad to see you. Update me as needed.

## 2020-02-08 ENCOUNTER — Telehealth: Payer: Self-pay | Admitting: *Deleted

## 2020-02-08 DIAGNOSIS — J309 Allergic rhinitis, unspecified: Secondary | ICD-10-CM | POA: Diagnosis not present

## 2020-02-08 DIAGNOSIS — L817 Pigmented purpuric dermatosis: Secondary | ICD-10-CM | POA: Diagnosis not present

## 2020-02-08 DIAGNOSIS — N400 Enlarged prostate without lower urinary tract symptoms: Secondary | ICD-10-CM | POA: Diagnosis not present

## 2020-02-08 DIAGNOSIS — L723 Sebaceous cyst: Secondary | ICD-10-CM | POA: Diagnosis not present

## 2020-02-08 DIAGNOSIS — E785 Hyperlipidemia, unspecified: Secondary | ICD-10-CM | POA: Diagnosis not present

## 2020-02-08 DIAGNOSIS — L03312 Cellulitis of back [any part except buttock]: Secondary | ICD-10-CM | POA: Diagnosis not present

## 2020-02-08 MED ORDER — AMOXICILLIN-POT CLAVULANATE 875-125 MG PO TABS
1.0000 | ORAL_TABLET | Freq: Two times a day (BID) | ORAL | 0 refills | Status: DC
Start: 2020-02-08 — End: 2020-02-09

## 2020-02-08 NOTE — Telephone Encounter (Signed)
Amy nurse with [facility] left a voicemail stating that she saw the patient for the first time today. Amy stated that his wound has some warmth and redness to it. Amy stated that she took the band-aid off and the wound has some redness at the site. Amy stated that redness area measures 8.5 cm by 9.5 cm. Amy stated that she feels that the patient needs to be on an antibiotic. Amy stated that she put foam on the wound because she felt that it needed more that a band-aid. Amy wants to know if Dr. Damita Dunnings wants her to put some sliver alginate on the wound?  Amy wants to know if she can get an order for wound care?

## 2020-02-08 NOTE — Assessment & Plan Note (Addendum)
Advance directive- Sister [place]ton [place] designated patient were incapacitated.  Per patient report, "she is the only one I trust".

## 2020-02-08 NOTE — Assessment & Plan Note (Signed)
Redressed.  Continue current Augmentin prescription.  We are getting home health set up.

## 2020-02-08 NOTE — Telephone Encounter (Signed)
I greatly appreciate help from home health.  If they see the need for Adult Protective Services then I appreciate their input and I will defer to/support their judgment.  Please have them update me after they have seen patient again in the home.  Thank you.

## 2020-02-08 NOTE — Telephone Encounter (Signed)
Amy notified as instructed by telephone and verbal order was given for wound care. Amy stated that since this is the first time she is seeing him she does not have anything to compare the wound with previously. Amy stated that patient must have finished all of his antibiotic because he had none left today. Amy stated that she took a picture of the wound and will try to e-mail it to the office which I gave her the e-mail address. Amy stated that she is concerned that the patient is driving. Amy is wondering if they should send out Adult Protective Services in the future. Amy stated that she is going to visit him a few times before she may make that recommendation. Amy stated that she is on vacation Friday but will be sending another nurse out Friday to do his wound care. Amy stated that she will plan on seeing the patient Monday again herself.

## 2020-02-08 NOTE — Assessment & Plan Note (Signed)
Flu declined by patient Shingles previously done with Zostavax. PNA up-to-date Tetanus 2006, discussed with patient. Covid vaccine declined by patient. Colon cancer screening not due given his age. Prostate cancer screening not due given his age. Advance directive- Sister [place] designated patient were incapacitated. Cognitive function addressed- see scanned forms- and if abnormal then additional documentation follows.

## 2020-02-08 NOTE — Assessment & Plan Note (Signed)
See above.  He is pleasant conversation but perseverates about his lawyer and his son.  He clearly has difficulty with some short-term recall.  He is resistant to talking about memory loss.  He denies that he has any memory loss.  He is not at a point where I know of any pending threat to himself or someone else so I do not have evidence to direct referral.  He is not yet interested in treatment.  We will monitor him in the meantime.  It may be that home health can shed some light on his situation.

## 2020-02-08 NOTE — Assessment & Plan Note (Signed)
Labs discussed with patient.  Would continue pravastatin.

## 2020-02-08 NOTE — Telephone Encounter (Signed)
I appreciate home health help. Please give them my thanks.  I need some clarification here. There may be some pink tissue that extends out farther than the central reddish area. The central reddish area had clearly gotten better previously. It had gotten less tender and smaller. The periphery was previously fading and clearly improving.  If the central reddish area has clearly gotten worse and he has bright red erythema that does not fade at all in the periphery, then we also need to recheck him at the clinic.  Please have her verify that he is taking his current antibiotic. I sent a refill for Augmentin in the meantime.  Please give an order for wound care. Reasonable to use silver alginate.  I thank all involved.

## 2020-02-09 ENCOUNTER — Other Ambulatory Visit: Payer: Self-pay | Admitting: *Deleted

## 2020-02-09 MED ORDER — AMOXICILLIN-POT CLAVULANATE 875-125 MG PO TABS: 1.0000 | ORAL_TABLET | Freq: Two times a day (BID) | ORAL | 0 refills | Status: DC

## 2020-02-09 NOTE — Telephone Encounter (Signed)
Left detailed message on Amy's voicemail.

## 2020-02-10 ENCOUNTER — Other Ambulatory Visit: Payer: Self-pay | Admitting: Neurology

## 2020-02-10 ENCOUNTER — Other Ambulatory Visit: Payer: Self-pay | Admitting: Family Medicine

## 2020-02-10 ENCOUNTER — Telehealth: Payer: Self-pay

## 2020-02-10 DIAGNOSIS — J309 Allergic rhinitis, unspecified: Secondary | ICD-10-CM | POA: Diagnosis not present

## 2020-02-10 DIAGNOSIS — N400 Enlarged prostate without lower urinary tract symptoms: Secondary | ICD-10-CM | POA: Diagnosis not present

## 2020-02-10 DIAGNOSIS — L817 Pigmented purpuric dermatosis: Secondary | ICD-10-CM | POA: Diagnosis not present

## 2020-02-10 DIAGNOSIS — L723 Sebaceous cyst: Secondary | ICD-10-CM | POA: Diagnosis not present

## 2020-02-10 DIAGNOSIS — L03312 Cellulitis of back [any part except buttock]: Secondary | ICD-10-CM | POA: Diagnosis not present

## 2020-02-10 DIAGNOSIS — E785 Hyperlipidemia, unspecified: Secondary | ICD-10-CM | POA: Diagnosis not present

## 2020-02-10 NOTE — Telephone Encounter (Addendum)
Tim pts son (DPR signed) left v/m requesting cb from Dr Damita Dunnings about pt. I left v/m requesting cb from [place] for more information about call. Tim called back and [name] said he got a couple of calls from [employer] heating and air conditioning that pt was at [facility] complaining about the heating system; Tim said pt having problems operating the thermostat. And [name] said that he got a call from [place] that pt was calling sheriff dept about heating and a/c system and people coming in house. Tim said that there was a [facility] nurse there this morning. [name] has a question for Dr Damita Dunnings; sheriff said that pt had called 911 nine - ten  times since Feb.; when Tim spoke with Dr Damita Dunnings previously pt was doing well enough could not take privileges such as driving etc. [name] was advised to ck on guardianship and was advised IVC involuntary commitment form and [name] does not want to do that unless very good reason to do that; Tim said he had done that before(IVC) and nothing really came of it and that is why pt is upset with his son now.Tim trying to contact [facility] nurse to see how she thought pt was doing this morning. Tim request cb from DR Damita Dunnings when he can; [name] has not been to pts home due to pt not wanting his son there. UC & ED precautions given and Tim voiced understanding.

## 2020-02-10 NOTE — Telephone Encounter (Signed)
Noted. Thanks.

## 2020-02-10 NOTE — Telephone Encounter (Signed)
Dr. Damita Dunnings,    I just wanted to follow up with you regarding communication with Law office on this patient.  In order to discuss care details we must receive a signed legal document from the law office with patient's expressed consent to allow discussion.  If law office calls back, we must have the faxed consent before we can discuss his medical care with them, even if they are the POA.  As an attorney's office they should be aware of this.   Thanks.

## 2020-02-10 NOTE — Telephone Encounter (Signed)
Called and LMOVM.  I didn't leave confidential info.

## 2020-02-12 IMAGING — CT CT HEAD W/O CM
3 series · 15 of 33 positions shown, 18 images · non-contrast
Comparison: 01/20/2016

CLINICAL DATA: Progressive memory loss, forgetfulness

EXAM:
CT HEAD WITHOUT CONTRAST
TECHNIQUE: Contiguous axial images were obtained from the base of the skull
through the vertex without intravenous contrast.

[Series 2: head 5.0 h37s · axial · 0.43mm/px · z∈[+113,+233]mm · 7 of 30 slices shown, 9 images]
[im 3/30  soft-tissue]
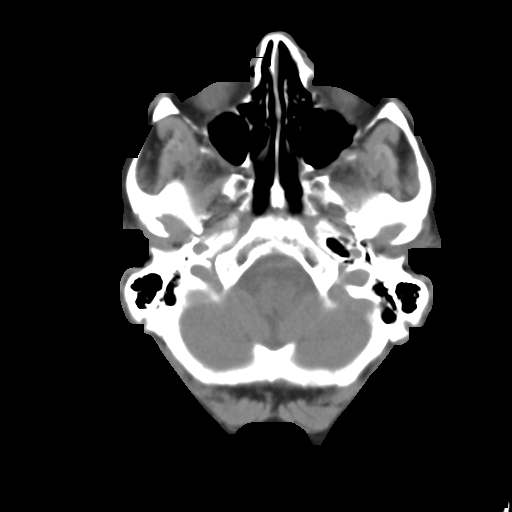
[im 3/30  bone]
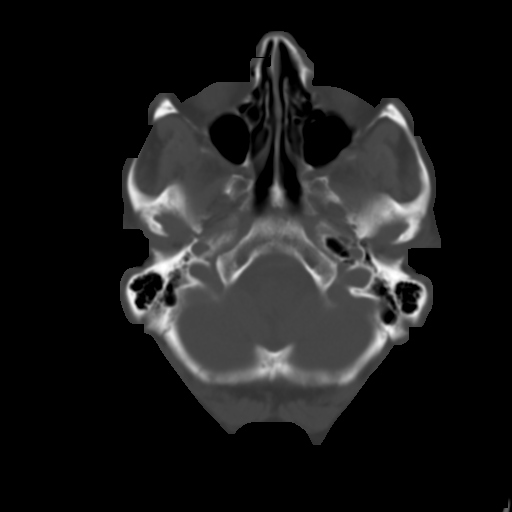
[im 7/30  bone]
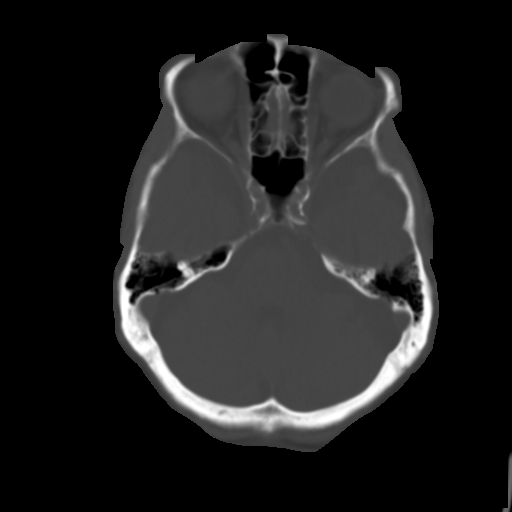
[im 12/30  bone]
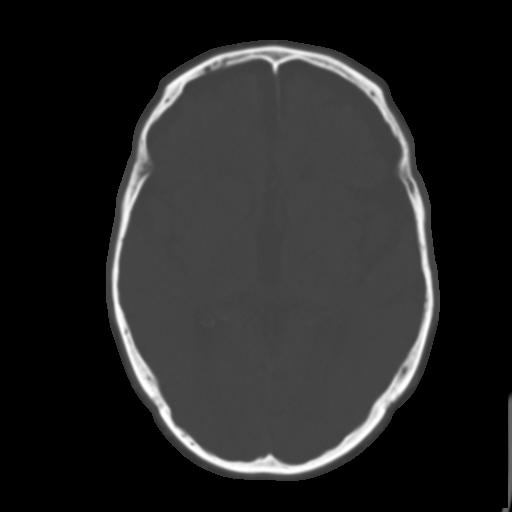
[im 16/30  bone]
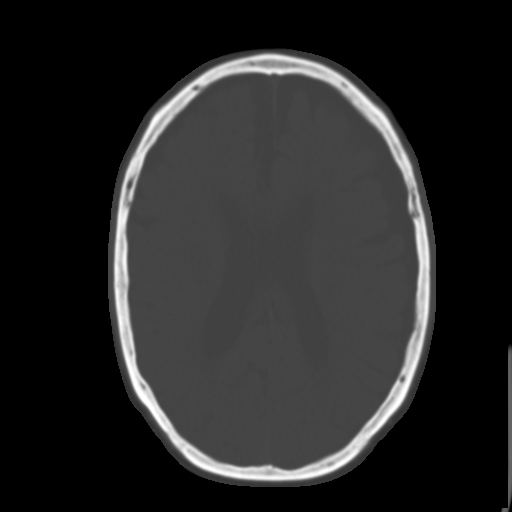
[im 18/30  soft-tissue]
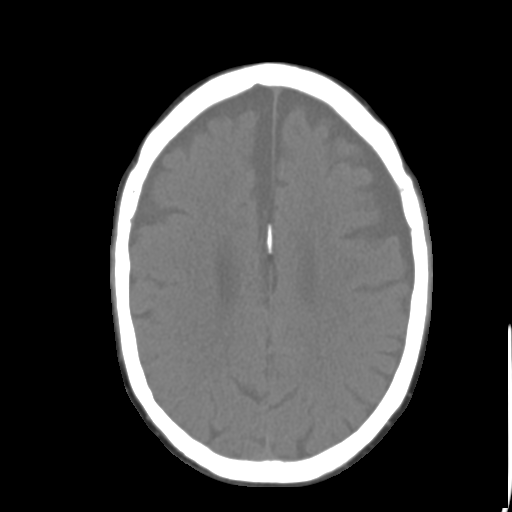
[im 18/30  bone]
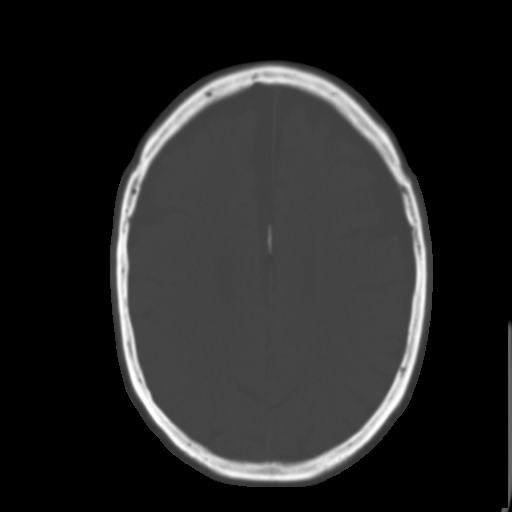
[im 23/30  bone]
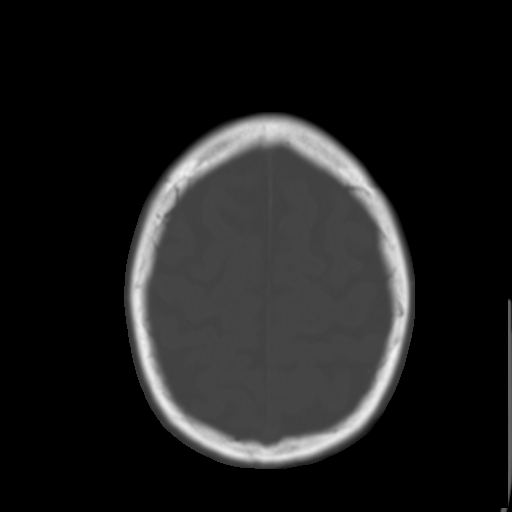
[im 27/30  bone]
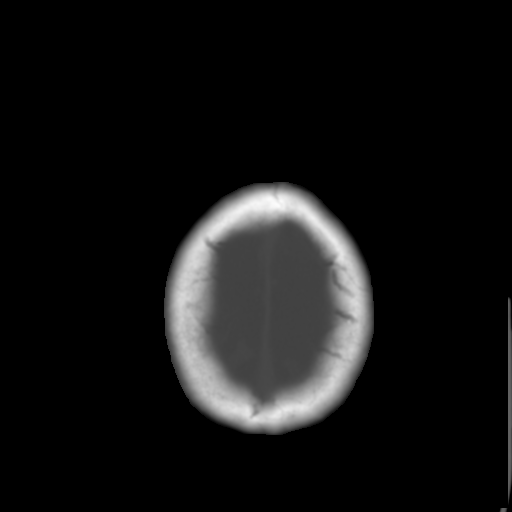

[Series 4: head 3.0 mpr cor · coronal · 0.28mm/px · 3 of 65 slices shown]
[im 13/65  bone]
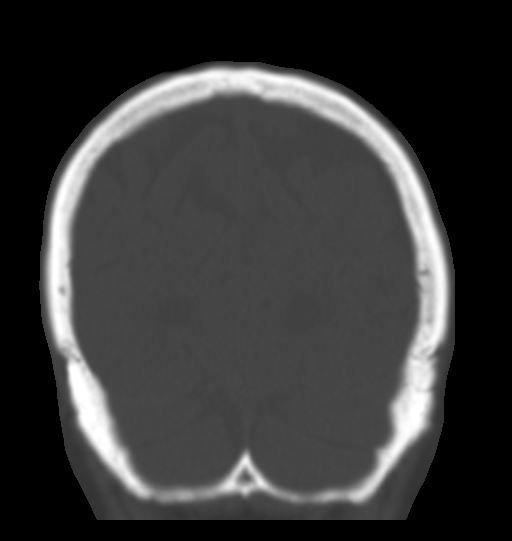
[im 26/65  bone]
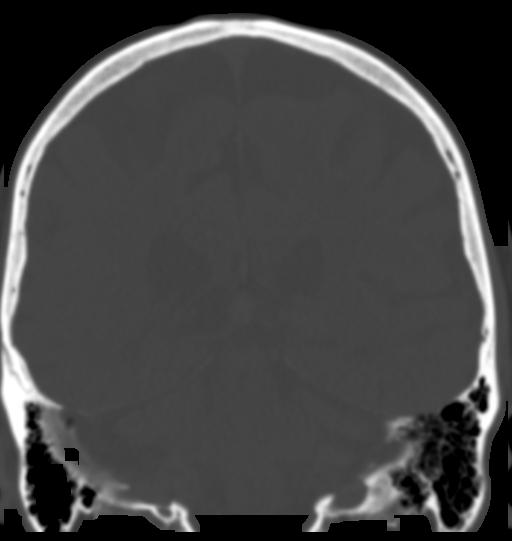
[im 39/65  bone]
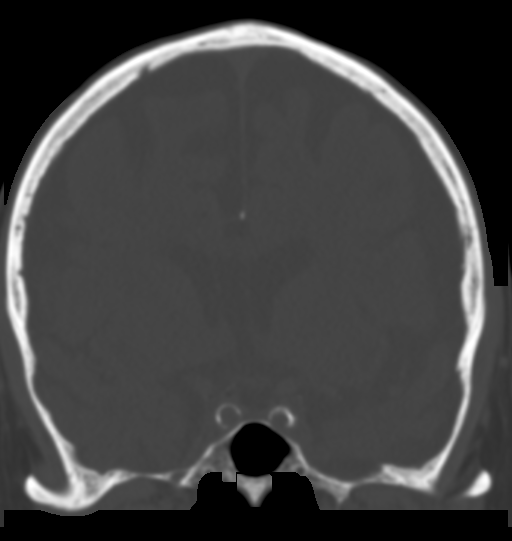

[Series 5: head 3.0 mpr sag · sagittal · 0.29mm/px · 5 of 50 slices shown, 6 images]
[im 17/50  bone]
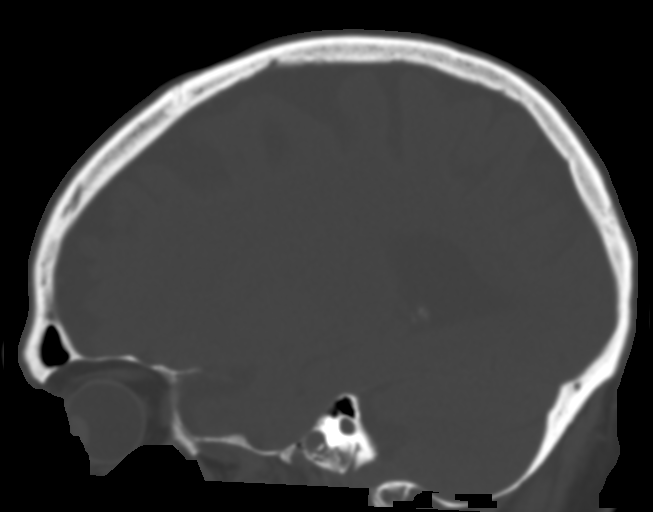
[im 21/50  bone]
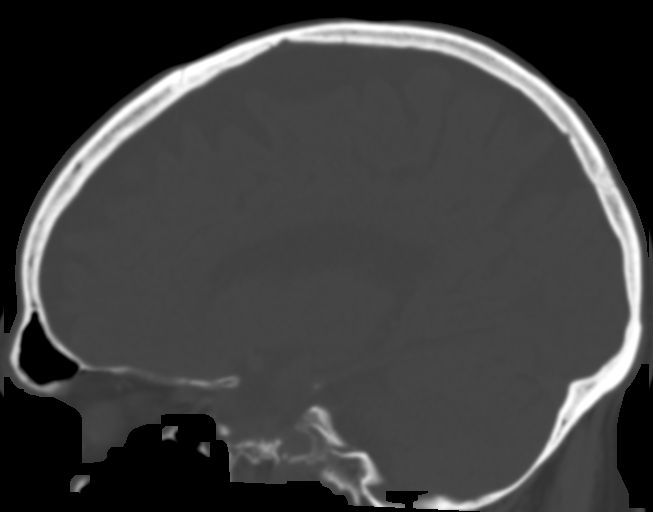
[im 25/50  soft-tissue]
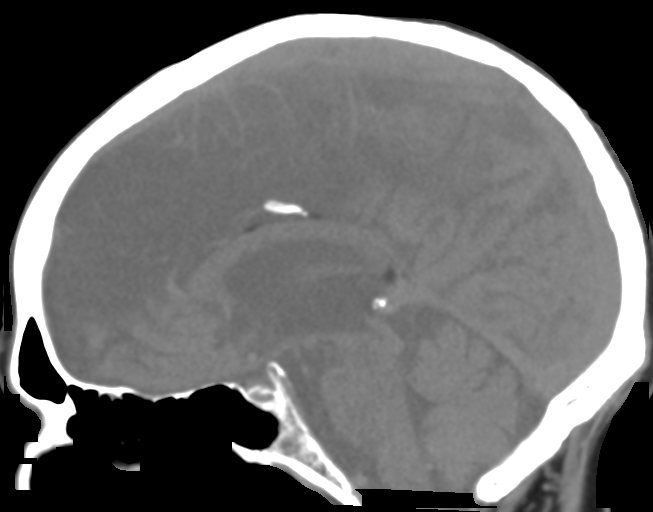
[im 25/50  bone]
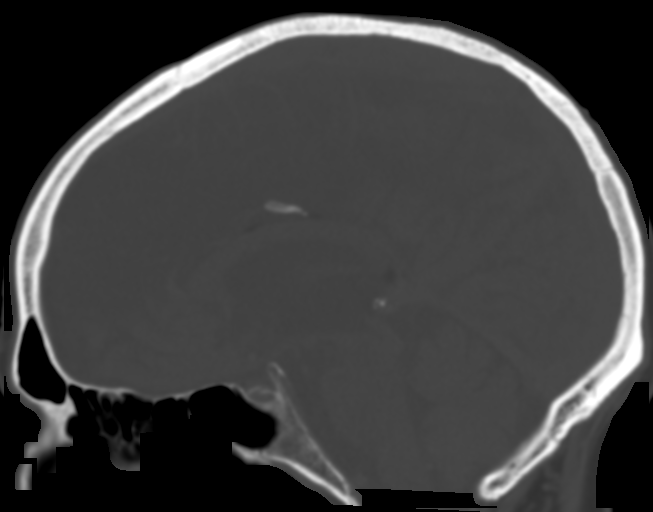
[im 29/50  bone]
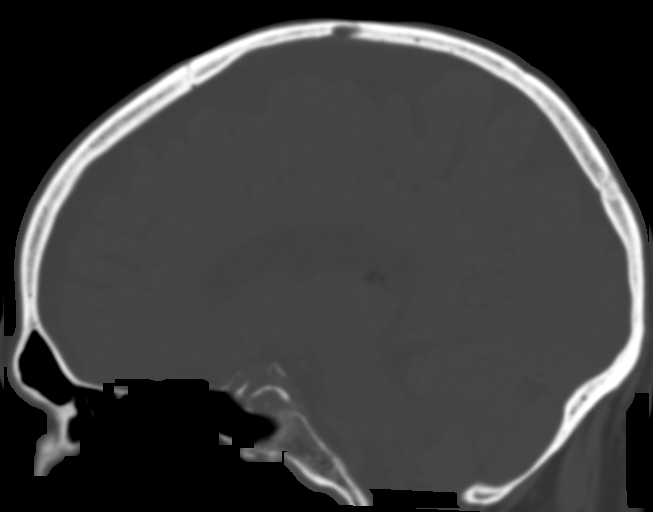
[im 33/50  bone]
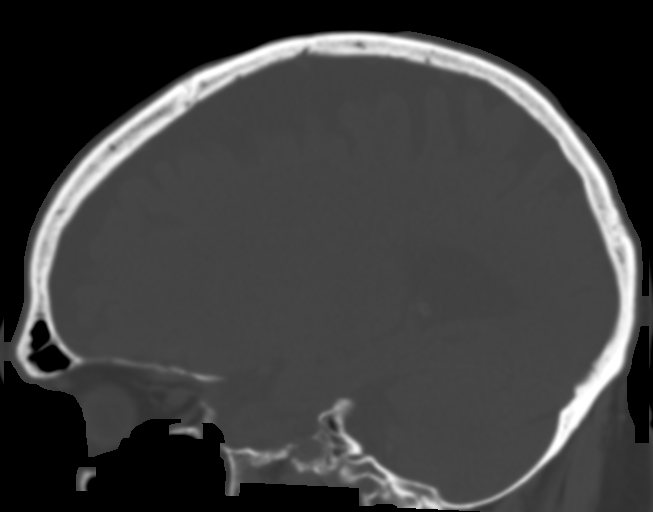

[15 of 33 positions shown; findings below may reference images not displayed]

FINDINGS: Brain: Stable atrophy pattern and chronic white matter microvascular
ischemic changes throughout both cerebral hemispheres. Similar
anterior frontal CSF attenuation fluid accumulation, left greater
than right compatible with chronic frontal subdural hygromas.

No acute intracranial hemorrhage, new mass lesion, infarction,
midline shift, hydrocephalus. Cisterns are patent. Cerebellar
atrophy as well.

Vascular: Intracranial atherosclerosis.  No hyperdense vessel.

Skull: Normal. Negative for fracture or focal lesion.

Sinuses/Orbits: No acute finding.

Other: None.
IMPRESSION: Stable atrophy and chronic subdural hygromas, larger on the left.

No interval change or acute process by noncontrast CT.

Incidental peri callosum lipoma.

## 2020-02-13 DIAGNOSIS — E785 Hyperlipidemia, unspecified: Secondary | ICD-10-CM | POA: Diagnosis not present

## 2020-02-13 DIAGNOSIS — L03312 Cellulitis of back [any part except buttock]: Secondary | ICD-10-CM | POA: Diagnosis not present

## 2020-02-13 DIAGNOSIS — L817 Pigmented purpuric dermatosis: Secondary | ICD-10-CM | POA: Diagnosis not present

## 2020-02-13 DIAGNOSIS — L723 Sebaceous cyst: Secondary | ICD-10-CM | POA: Diagnosis not present

## 2020-02-13 DIAGNOSIS — N400 Enlarged prostate without lower urinary tract symptoms: Secondary | ICD-10-CM | POA: Diagnosis not present

## 2020-02-13 DIAGNOSIS — J309 Allergic rhinitis, unspecified: Secondary | ICD-10-CM | POA: Diagnosis not present

## 2020-02-15 NOTE — Telephone Encounter (Signed)
Called and LMOVM.  I didn't leave confidential info.    Please see if you can get any extra information from home health about this patient's status.  Thanks.

## 2020-02-16 ENCOUNTER — Telehealth: Payer: Self-pay

## 2020-02-16 DIAGNOSIS — L817 Pigmented purpuric dermatosis: Secondary | ICD-10-CM | POA: Diagnosis not present

## 2020-02-16 DIAGNOSIS — L03312 Cellulitis of back [any part except buttock]: Secondary | ICD-10-CM | POA: Diagnosis not present

## 2020-02-16 DIAGNOSIS — J309 Allergic rhinitis, unspecified: Secondary | ICD-10-CM | POA: Diagnosis not present

## 2020-02-16 DIAGNOSIS — E785 Hyperlipidemia, unspecified: Secondary | ICD-10-CM | POA: Diagnosis not present

## 2020-02-16 DIAGNOSIS — L723 Sebaceous cyst: Secondary | ICD-10-CM | POA: Diagnosis not present

## 2020-02-16 DIAGNOSIS — N400 Enlarged prostate without lower urinary tract symptoms: Secondary | ICD-10-CM | POA: Diagnosis not present

## 2020-02-16 NOTE — Telephone Encounter (Signed)
If patient consents for palliative care eval, then I support that.   Please ask her to attempt treatment of granulation tissue with silver nitrate at home.   Please update [place] as needed.  Thanks.

## 2020-02-16 NOTE — Telephone Encounter (Signed)
Amy notified of Dr. Josefine Class comments. Amy said she is going to see how pt does with the palliative eval because pt is only focused on trying to get his son charged with stealing from him that any other help Amy offers he declines but Amy said she will have to d/c him soon once wound heals and if she can get palliative on board then someone will at least be checking in on him regularly because he has no other help or family checking on him.   Amy also said she will try the silver nitrate at home but she asked if PCP thinks that he needs to come back in for a f/u to recheck the wound soon. She said for pt to have only 2 days left of abx his wound is still pretty redish/purple and warm to the touch. She said she can tell it's a little better because the swelling has went down but it still looks "angry"

## 2020-02-16 NOTE — Telephone Encounter (Signed)
1. Palliative care will reaching out for orders for an evaluation. This will help keep a NP, RN, and SW around when she has to discharge him. He is not talking to his son.  2. His wound looks a little. Redness and swelling has gone down some but it still looks angry. Anything is an improvement, though. The wound has hypergranulated. Silver nitrate would help it heal. Drainage was greenish/yellow. Would Dr Damita Dunnings want her to try that in the home if she could get some or have him come in the office to have it done?  Vitals are good. Taking his Augmentin. Has 2 days left.  Please advise Amy at [phone].

## 2020-02-17 NOTE — Telephone Encounter (Signed)
I think it makes sense to get him rechecked early next week when he has finished current rx for abx.  Would need recheck sooner if worse in the meantime.  Thanks.

## 2020-02-17 NOTE — Telephone Encounter (Signed)
Work-in appt scheduled pending Dr. Josefine Class approval.

## 2020-02-17 NOTE — Telephone Encounter (Signed)
[facility] nurse (Amy) says the patient appears to be eating and drinking and taking his medications appropriately.  She questions if he should be driving, worrying that he may drive off and forget his way.

## 2020-02-20 ENCOUNTER — Telehealth: Payer: Self-pay

## 2020-02-20 DIAGNOSIS — L817 Pigmented purpuric dermatosis: Secondary | ICD-10-CM | POA: Diagnosis not present

## 2020-02-20 DIAGNOSIS — J309 Allergic rhinitis, unspecified: Secondary | ICD-10-CM | POA: Diagnosis not present

## 2020-02-20 DIAGNOSIS — E785 Hyperlipidemia, unspecified: Secondary | ICD-10-CM | POA: Diagnosis not present

## 2020-02-20 DIAGNOSIS — L723 Sebaceous cyst: Secondary | ICD-10-CM | POA: Diagnosis not present

## 2020-02-20 DIAGNOSIS — L03312 Cellulitis of back [any part except buttock]: Secondary | ICD-10-CM | POA: Diagnosis not present

## 2020-02-20 DIAGNOSIS — N400 Enlarged prostate without lower urinary tract symptoms: Secondary | ICD-10-CM | POA: Diagnosis not present

## 2020-02-20 NOTE — Telephone Encounter (Signed)
Please keep patient on schedule for 12:45 tomorrow.  Thanks.  We can discuss then.

## 2020-02-20 NOTE — Telephone Encounter (Signed)
1236 pm.  Phone call made to both listed #'s.  I was able to leave a message for patient on the home phone requesting a call back.  Cell # is the number for [name].

## 2020-02-21 ENCOUNTER — Ambulatory Visit (INDEPENDENT_AMBULATORY_CARE_PROVIDER_SITE_OTHER): Payer: Medicare Other | Admitting: Family Medicine

## 2020-02-21 ENCOUNTER — Other Ambulatory Visit: Payer: Self-pay

## 2020-02-21 ENCOUNTER — Encounter: Payer: Self-pay | Admitting: Family Medicine

## 2020-02-21 DIAGNOSIS — L089 Local infection of the skin and subcutaneous tissue, unspecified: Secondary | ICD-10-CM

## 2020-02-21 DIAGNOSIS — R413 Other amnesia: Secondary | ICD-10-CM

## 2020-02-21 DIAGNOSIS — L723 Sebaceous cyst: Secondary | ICD-10-CM

## 2020-02-21 NOTE — Patient Instructions (Signed)
Finish your antibiotics and update me as needed.  I would continue as is.   No change for the visit.  Take care.  Glad to see you.

## 2020-02-21 NOTE — Progress Notes (Signed)
This visit occurred during the SARS-CoV-2 public health emergency.  Safety protocols were in place, including screening questions prior to the visit, additional usage of staff PPE, and extensive cleaning of exam room while observing appropriate contact time as indicated for disinfecting solutions.  F/u re: back lesion.  "'It's way better and not painful. It feels 100% better."    Memory d/w pt. he did not want to engage in a conversation about his memory.  He accused [place] of taking his copy of his med list at his last office visit and not giving it back to him.  We did not take any med list or any other item from him at any point without returning it.  I had to redirect him back to the conversation about his skin lesion on his back.  Meds, vitals, and allergies reviewed.   ROS: Per HPI unless specifically indicated in ROS section   nad He seemed irritable initially discussing his med list as above but he became pleasant when he was talking about his back. Back with minimal local irritation w/o fluctuant mass.  No drainage.  Areas dressed.Marland Kitchen

## 2020-02-22 ENCOUNTER — Telehealth: Payer: Self-pay | Admitting: Family Medicine

## 2020-02-22 ENCOUNTER — Telehealth: Payer: Self-pay

## 2020-02-22 NOTE — Telephone Encounter (Signed)
905 am.  Phone call made to Jeffrey [name] to schedule an appointment with the Palliative Care team.  Patient answered call and I explained Palliative Care services.  He is agreeable to meet with the team Friday at 9 am.

## 2020-02-22 NOTE — Assessment & Plan Note (Signed)
He did not want to discuss this today.

## 2020-02-22 NOTE — Telephone Encounter (Signed)
Given the patient's situation with his memory I called his son to [name] today.  We talked about his situation.  Tim sent extra information regarding the patient.  The patient had reportedly been calling the heating and air company with complaints about his thermostat.  He was having trouble working his thermostat and getting irritable with that staff.  He had made multiple 911 calls.  His memory is getting worse.  The patient has cut off communication with Tim.  I have no record or evidence of abuse or neglect from [place] towards his father.  My conversations with [name] have always been reasonable and to [name] states his only concern is to get good and appropriate care for his father.  Given all of this, Tim and I agreed that it was reasonable for me to call Adult Protective Services to see what options were available.  I do not know of any imminent threat or immediate danger to or from the patient.  I do not know of any specific instance of abuse or neglect, but I suspect that his memory will get worse as time passes and I would like him to get good care so that he does not enter into a potentially hazardous situation.  I do not suspect any volitional ill will on the part of the patient.  When I talked to Adult Protective Services I identified myself and described the situation and they agreed that if there was no imminent threat it was reasonable for me to call Clarita Crane at [phone] at [employer].  I did call and leave a message and I will await return call from her.  He does have home health coming out currently which is useful.  When his back completely heals up he may be able to transition over to palliative care which may also be helpful.  I appreciate the help of all involved helping care for this kind and elderly gentleman who through no apparent fault of his own is having progressive memory changes.

## 2020-02-22 NOTE — Assessment & Plan Note (Signed)
Clearly improved.  Not tender.  No fevers.  Erythema is resolving.  The lesion looks significantly better each time I have seen it here in the clinic.  I would finish his current prescription of antibiotics, dress the lesion as needed and then update me as needed.

## 2020-02-24 ENCOUNTER — Other Ambulatory Visit: Payer: Medicare Other

## 2020-02-24 ENCOUNTER — Other Ambulatory Visit: Payer: Self-pay

## 2020-02-24 VITALS — BP 130/62 | HR 66 | Temp 97.4°F | Resp 20

## 2020-02-24 DIAGNOSIS — L723 Sebaceous cyst: Secondary | ICD-10-CM | POA: Diagnosis not present

## 2020-02-24 DIAGNOSIS — L817 Pigmented purpuric dermatosis: Secondary | ICD-10-CM | POA: Diagnosis not present

## 2020-02-24 DIAGNOSIS — Z515 Encounter for palliative care: Secondary | ICD-10-CM

## 2020-02-24 DIAGNOSIS — E785 Hyperlipidemia, unspecified: Secondary | ICD-10-CM | POA: Diagnosis not present

## 2020-02-24 DIAGNOSIS — N400 Enlarged prostate without lower urinary tract symptoms: Secondary | ICD-10-CM | POA: Diagnosis not present

## 2020-02-24 DIAGNOSIS — J309 Allergic rhinitis, unspecified: Secondary | ICD-10-CM | POA: Diagnosis not present

## 2020-02-24 DIAGNOSIS — L03312 Cellulitis of back [any part except buttock]: Secondary | ICD-10-CM | POA: Diagnosis not present

## 2020-02-24 NOTE — Progress Notes (Signed)
COMMUNITY PALLIATIVE CARE SW NOTE  PATIENT NAME: CORDAY WYKA DOB: 03-16-1929 MRN: [id number]  PRIMARY CARE PROVIDER: Tonia Ghent, MD  RESPONSIBLE PARTY:  Acct ID - Guarantor Home Phone Work Phone Relationship Acct Type  [id number] NAFTULA, [phone]  Self P/F     [address], [place], [place] [address]     PLAN OF CARE and INTERVENTIONS:             1. GOALS OF CARE/ ADVANCE CARE PLANNING:  Patient is a Full code. Code status discussed in detail with patient. Patient's goal is to remain in the home as independent.  2. SOCIAL/EMOTIONAL/SPIRITUAL ASSESSMENT/ INTERVENTIONS:  SW and RN met with patient in his home, Amie, RN with [facility] present as well. Patient lives independent in a one story home. Patient is a widow, wife deceased 2019-03-06. Patient provided update on his medical status. Patient shared that he feels he ahs been doing well. Advance RN placed new dressing to lesion on patients back, area appears to be almost healed. Patient shares that he manages his own medications. Patient eats 2 meals a day. Patient shared that he enjoys going to [employer] and ordering a fish fillet sandwich and sweet tea everyday. No pain reported. Patient states that he sleeps pretty good, but slept better when his wife was living. 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 Patient also shared some information on legal issues that he is dealing with. SW inquired if patient would be open to having someone come into his home and offer support (talking, grocery shopping, light house keeping etc) Patient denied these services at this time. RN checked vitals and reviewed meds. SW, discussed goals, reviewed care plan, provided emotional support, used active and reflective listening. Patient is open to  palliative care services visiting him. Will continue to follow for symptom management and grief support.    3. PATIENT/CAREGIVER EDUCATION/ COPING:  Patient A&O and engaged in discussion with SW. Patient shared that he has memory issues and can't remember things sometimes. Patient appears to be still grieving the loss of his wife. Patient continues going through grieving process. Patient has a sister who he talks to daily, and a son whom he says their relationship is estranged at this time, due to legal issues. 4. PERSONAL EMERGENCY PLAN:  Patient to call 911 for emergencies. 5. COMMUNITY RESOURCES COORDINATION/ HEALTH CARE NAVIGATION:  Patient manages his own care.  6. FINANCIAL/LEGAL CONCERNS/INTERVENTIONS:  None.      SOCIAL HX:  Social History   Tobacco Use  . Smoking status: Former Smoker    Years: 5.00    Types: Cigarettes    Quit date: 05/05/1958    Years since quitting: 61.8  . Smokeless tobacco: Never Used  Substance Use Topics  . Alcohol use: No    CODE STATUS: Full code  ADVANCED DIRECTIVES: Y MOST FORM COMPLETE:  N HOSPICE EDUCATION PROVIDED: N  PPS: Patient is independent with all ADL's and still drives.    Time Spent: 1 hr.       Doreene Eland, LCSW

## 2020-02-24 NOTE — Progress Notes (Signed)
PATIENT NAME: Jeffrey [name] DOB: 05/02/1929 MRN: [id number]  PRIMARY CARE PROVIDER: Tonia Ghent, MD  RESPONSIBLE PARTY:  Acct ID - Guarantor Home Phone Work Phone Relationship Acct Type  [id number] Jeffrey [name], [name]  Self P/F     [place] [address], [place], [place] [address]    PLAN OF CARE and INTERVENTIONS:               1.  GOALS OF CARE/ ADVANCE CARE PLANNING:  Patient desires to remain home and independent.               2.  PATIENT/CAREGIVER EDUCATION:  Palliative Care Services               3.  DISEASE STATUS:  Joint visit with Jeffrey [name], SW and Jeffrey [name], Jeffrey [name], sports with [facility].  Patient greeted team at the door.  He is independent without assistive devices.  Patient denies any pain.  No falls are reported.  Patient provides an update on his situation over the last year including the loss of his wife almost a year ago.  Patient reports sleeping well during the night and denies napping during the day.  He is still driving and goes to [employer] daily for a fish sandwich and tea.  Appetite is good and snacks during the day.  He reports eating 2 meals a day. He is managing his own medications at this time. Patient spent a good amount of time reflecting on his life and time in the [employer].   Explained Palliative Care services and patient is agreeable to continue with services.  Patient has been seen by Jeffrey Denmark, NP with neurology and PCP is Dr. Elsie [name].    HISTORY OF PRESENT ILLNESS:  86 year old male who has a h/o of TIA and memory changes.  He is being followed by Palliative Care monthly and PRN.  CODE STATUS: Full ADVANCED DIRECTIVES: Yes MOST FORM: No PPS: 60%   PHYSICAL EXAM:   VITALS: TEMP 97.4 F  BP 130/62 P 66 R 20 O2 Sats 98% on RA LUNGS: CTA CARDIAC:  HRR EXTREMITIES: No edema present SKIN:  Small wound to back that is is almost healed.  Area being managed by [employer]. NEURO: Alert and oriented with forgetfulness  Visit time 9 am- 1010am       Lorenza Burton, RN

## 2020-02-28 DIAGNOSIS — L03312 Cellulitis of back [any part except buttock]: Secondary | ICD-10-CM | POA: Diagnosis not present

## 2020-02-28 DIAGNOSIS — N400 Enlarged prostate without lower urinary tract symptoms: Secondary | ICD-10-CM | POA: Diagnosis not present

## 2020-02-28 DIAGNOSIS — L817 Pigmented purpuric dermatosis: Secondary | ICD-10-CM | POA: Diagnosis not present

## 2020-02-28 DIAGNOSIS — J309 Allergic rhinitis, unspecified: Secondary | ICD-10-CM | POA: Diagnosis not present

## 2020-02-28 DIAGNOSIS — E785 Hyperlipidemia, unspecified: Secondary | ICD-10-CM | POA: Diagnosis not present

## 2020-02-28 DIAGNOSIS — L723 Sebaceous cyst: Secondary | ICD-10-CM | POA: Diagnosis not present

## 2020-03-02 DIAGNOSIS — L817 Pigmented purpuric dermatosis: Secondary | ICD-10-CM | POA: Diagnosis not present

## 2020-03-02 DIAGNOSIS — N400 Enlarged prostate without lower urinary tract symptoms: Secondary | ICD-10-CM | POA: Diagnosis not present

## 2020-03-02 DIAGNOSIS — E785 Hyperlipidemia, unspecified: Secondary | ICD-10-CM | POA: Diagnosis not present

## 2020-03-02 DIAGNOSIS — J309 Allergic rhinitis, unspecified: Secondary | ICD-10-CM | POA: Diagnosis not present

## 2020-03-02 DIAGNOSIS — L03312 Cellulitis of back [any part except buttock]: Secondary | ICD-10-CM | POA: Diagnosis not present

## 2020-03-02 DIAGNOSIS — L723 Sebaceous cyst: Secondary | ICD-10-CM | POA: Diagnosis not present

## 2020-03-03 DIAGNOSIS — Z9181 History of falling: Secondary | ICD-10-CM | POA: Diagnosis not present

## 2020-03-03 DIAGNOSIS — Z9089 Acquired absence of other organs: Secondary | ICD-10-CM | POA: Diagnosis not present

## 2020-03-03 DIAGNOSIS — Z8673 Personal history of transient ischemic attack (TIA), and cerebral infarction without residual deficits: Secondary | ICD-10-CM | POA: Diagnosis not present

## 2020-03-03 DIAGNOSIS — Z85828 Personal history of other malignant neoplasm of skin: Secondary | ICD-10-CM | POA: Diagnosis not present

## 2020-03-03 DIAGNOSIS — L723 Sebaceous cyst: Secondary | ICD-10-CM | POA: Diagnosis not present

## 2020-03-03 DIAGNOSIS — N400 Enlarged prostate without lower urinary tract symptoms: Secondary | ICD-10-CM | POA: Diagnosis not present

## 2020-03-03 DIAGNOSIS — R413 Other amnesia: Secondary | ICD-10-CM | POA: Diagnosis not present

## 2020-03-03 DIAGNOSIS — Z7982 Long term (current) use of aspirin: Secondary | ICD-10-CM | POA: Diagnosis not present

## 2020-03-03 DIAGNOSIS — L817 Pigmented purpuric dermatosis: Secondary | ICD-10-CM | POA: Diagnosis not present

## 2020-03-03 DIAGNOSIS — E785 Hyperlipidemia, unspecified: Secondary | ICD-10-CM | POA: Diagnosis not present

## 2020-03-03 DIAGNOSIS — L03312 Cellulitis of back [any part except buttock]: Secondary | ICD-10-CM | POA: Diagnosis not present

## 2020-03-03 DIAGNOSIS — N529 Male erectile dysfunction, unspecified: Secondary | ICD-10-CM | POA: Diagnosis not present

## 2020-03-03 DIAGNOSIS — J309 Allergic rhinitis, unspecified: Secondary | ICD-10-CM | POA: Diagnosis not present

## 2020-03-03 DIAGNOSIS — Z87891 Personal history of nicotine dependence: Secondary | ICD-10-CM | POA: Diagnosis not present

## 2020-03-03 DIAGNOSIS — K5790 Diverticulosis of intestine, part unspecified, without perforation or abscess without bleeding: Secondary | ICD-10-CM | POA: Diagnosis not present

## 2020-03-03 DIAGNOSIS — Z4800 Encounter for change or removal of nonsurgical wound dressing: Secondary | ICD-10-CM | POA: Diagnosis not present

## 2020-03-06 DIAGNOSIS — L817 Pigmented purpuric dermatosis: Secondary | ICD-10-CM | POA: Diagnosis not present

## 2020-03-06 DIAGNOSIS — L723 Sebaceous cyst: Secondary | ICD-10-CM | POA: Diagnosis not present

## 2020-03-06 DIAGNOSIS — J309 Allergic rhinitis, unspecified: Secondary | ICD-10-CM | POA: Diagnosis not present

## 2020-03-06 DIAGNOSIS — L03312 Cellulitis of back [any part except buttock]: Secondary | ICD-10-CM | POA: Diagnosis not present

## 2020-03-06 DIAGNOSIS — E785 Hyperlipidemia, unspecified: Secondary | ICD-10-CM | POA: Diagnosis not present

## 2020-03-06 DIAGNOSIS — N400 Enlarged prostate without lower urinary tract symptoms: Secondary | ICD-10-CM | POA: Diagnosis not present

## 2020-03-07 ENCOUNTER — Other Ambulatory Visit: Payer: Self-pay

## 2020-03-07 ENCOUNTER — Encounter: Payer: Self-pay | Admitting: Family Medicine

## 2020-03-07 ENCOUNTER — Ambulatory Visit (INDEPENDENT_AMBULATORY_CARE_PROVIDER_SITE_OTHER): Payer: Medicare Other | Admitting: Family Medicine

## 2020-03-07 ENCOUNTER — Telehealth: Payer: Self-pay

## 2020-03-07 VITALS — BP 130/68 | HR 65 | Temp 96.6°F | Ht 67.0 in | Wt 138.9 lb

## 2020-03-07 DIAGNOSIS — R413 Other amnesia: Secondary | ICD-10-CM | POA: Diagnosis not present

## 2020-03-07 DIAGNOSIS — H6983 Other specified disorders of Eustachian tube, bilateral: Secondary | ICD-10-CM

## 2020-03-07 DIAGNOSIS — R209 Unspecified disturbances of skin sensation: Secondary | ICD-10-CM | POA: Insufficient documentation

## 2020-03-07 DIAGNOSIS — R42 Dizziness and giddiness: Secondary | ICD-10-CM

## 2020-03-07 HISTORY — DX: Dizziness and giddiness: R42

## 2020-03-07 HISTORY — DX: Unspecified disturbances of skin sensation: R20.9

## 2020-03-07 MED ORDER — TRIAMCINOLONE ACETONIDE 55 MCG/ACT NA AERO: 2.0000 | INHALATION_SPRAY | NASAL | 1 refills | Status: DC

## 2020-03-07 NOTE — Telephone Encounter (Signed)
Pt came to office because he thought the instructions for the Nasal allergy spray was complicated and pt requested help with the spray. I told pt the instructions were on the box that the nasal allergy spray is in. Pt is to use 2 sprays in each nostril every other day.  I advised pt to blow his nose first; then to take the bottle out of box and shake it well. Remove the blue guard so sprayer will work; then remove top of Designer, industrial/product. Prime the spray by pushing down on rim at top of sprayer couple of times and the third time look for spray to come out of container. Then place sprayer in one nostril hold breath and spray one time; pt then sniffed and repeated in same nostril. Then pt placed the sprayer in the other nostril and repeated process. Cleaned off tip of sprayer, replaced top to sprayer and the guard so will not lose excess spray unnecessarily if put in pocket. Advised pt today is Wednesday and to take next sprays on Friday being every other day. Pt had several questions and I answered them and advised pt if he needed anything else to let us know and pt was appreciated. Bandaid was applied to rt ring finger after pt said he poked the finger with one of the papers from the pharmacy; pt had cleaned the area and I placed a bandaid on finger. FYI to Dr Glori Bickers.

## 2020-03-07 NOTE — Telephone Encounter (Signed)
Thanks so much Monaco!    Will cc to pcp for fyi also

## 2020-03-07 NOTE — Assessment & Plan Note (Signed)
Pt notices foot discomfort/stiffness when cold Reassuring exam with good pedal pulses and skin intact today  Suspect OA acts up when he is cold so adv to continue wearing socks and keeping feet warm. Massage with moisturizer helps also He already noticed improvement doing this for several days inst to alert [place] if symptoms worsen again

## 2020-03-07 NOTE — Patient Instructions (Addendum)
When your feet get cold it makes them stiff  (from arthritis)  Keep socks on and keep putting lotion on your feet (what you are using is fine)   For ear pressure /dizziness - use the nasacort nasal spray every other day This will help open ear tubes from the inside to equalize pressure  It may take a few weeks of use to get relief  If this gives you nosebleeds please stop using it and let us know   Please update [place] if not improved in 2 weeks or if your symptoms worsen

## 2020-03-07 NOTE — Assessment & Plan Note (Signed)
Dizziness with head tilt back or gaze upwards Suspect vertiginous in nature  Will tx ETD with steroid ns (wathcing of nosebleed)  Update if not starting to improve in a week or if worsening   Fall prev discussed

## 2020-03-07 NOTE — Progress Notes (Signed)
Subjective:    Patient ID: Jeffrey [name], male    DOB: 24-May-1928, 86 y.o.   MRN: [id number]  This visit occurred during the SARS-CoV-2 public health emergency.  Safety protocols were in place, including screening questions prior to the visit, additional usage of staff PPE, and extensive cleaning of exam room while observing appropriate contact time as indicated for disinfecting solutions.    HPI 86 yo pt of Dr Damita Dunnings presents with multiple complaints   Wt Readings from Last 3 Encounters:  03/07/20 138 lb 14.4 oz (63 kg)  02/21/20 138 lb 4 oz (62.7 kg)  02/07/20 138 lb 7 oz (62.8 kg)   21.75 kg/m  C/o feet feeling cold/stiff (actually better today)  Kept him from sleeping at night  Started to use cetaphil moisturizing cream with socks -this helps  For a while- felt like he was walking on stiff shoes-better today also   Ears bothering him-feels like he has pressure on his ears (this comes and goes)  Also dizzy if he holds his head back for the past 2 weeks This occurred when putting drops in his eyes (has to tilt head back)   No numbness or weakness  No headaches   BP Readings from Last 3 Encounters:  03/07/20 130/68  02/24/20 130/62  02/21/20 (!) 144/64   Pulse Readings from Last 3 Encounters:  03/07/20 65  02/24/20 66  02/21/20 74    He takes claritin   Patient Active Problem List   Diagnosis Date Noted  . Dizziness 03/07/2020  . Bilateral cold feet 03/07/2020  . Infected sebaceous cyst 01/25/2020  . Rectal bleeding 09/26/2019  . Chronic constipation 09/19/2019  . Nasal sinus congestion 03/08/2019  . Lung nodule 2019-02-12  . Death of wife 02/12/2019  . Esophageal stenosis 01/09/2017  . Shoulder pain, right 08/24/2016  . Memory change 01/03/2016  . Left ankle pain 11/05/2015  . Advance care planning 12/29/2013  . Allergic rhinitis 12/23/2012  . Medicare annual wellness visit, subsequent 12/22/2011  . History of melanoma 09/22/2011  . Knee pain  08/13/2011  . Paresthesia of foot 08/13/2011  . ETD (eustachian tube dysfunction) 05/01/2011  . ED (erectile dysfunction) 11/14/2010  . Schamberg's purpura 08/01/2010  . EXTERNAL HEMORRHOIDS 05/02/2010  . HYPERTROPHY PROSTATE W/UR OBST & OTH LUTS 10/09/2009  . ELEVATED BLOOD PRESSURE WITHOUT DIAGNOSIS OF HYPERTENSION 10/03/2008  . TRANSIENT ISCHEMIC ATTACK 08/24/2007  . COLONIC POLYPS, HX OF 08/24/2006  . DIVERTICULOSIS, COLON 09/21/2000  . HLD (hyperlipidemia) 05/05/1988   Past Medical History:  Diagnosis Date  . Adenomatous polyp of colon 1995    w/ HGD  . Allergic rhinitis   . BPH (benign prostatic hyperplasia)   . Diverticulosis 09/21/2000  . ED (erectile dysfunction)   . Hyperlipemia 05/05/1988  . Memory loss   . Schamberg's purpura    2012- eval by Dr. Allyson Sabal  . Sebaceous cyst   . Skin cancer 05/05/1998   hx of Scalp-melanoma  . TIA (transient ischemic attack)    Past Surgical History:  Procedure Laterality Date  . Carotid U/S Nml  09/08/2007  . Colon polyp  2011   B9 Int Hemms  . Colonoscopy polyps  10/05/2003   Divertics in hemms  . COLONOSCOPY W/ POLYPECTOMY  08/1993; 09/21/2000;10/05/2003   B9  . Echo (other)  09/08/2007   nml lvf EF 50-55% Mild Diast Dysfctn Triv Ar  . melanoma scalp  07/2003  . SEPTOPLASTY  1995  . Skin revision vertex of scalp  2009  Dr. Luetta Nutting, [facility] History   Tobacco Use  . Smoking status: Former Smoker    Years: 5.00    Types: Cigarettes    Quit date: 05/05/1958    Years since quitting: 61.8  . Smokeless tobacco: Never Used  Vaping Use  . Vaping Use: Never used  Substance Use Topics  . Alcohol use: No  . Drug use: No   Family History  Problem Relation Age of Onset  . Rectal cancer Father   . Colon cancer Paternal Aunt   . Other Paternal Aunt        cancer on back of neck  . Prostate cancer Neg Hx    Allergies  Allergen Reactions  . Flonase [Fluticasone Propionate] Other (See Comments)    Nosebleeds.     . Tamsulosin     REACTION: flushed and arms felt stiff   Current Outpatient Medications on File Prior to Visit  Medication Sig Dispense Refill  . aspirin (BAYER ASPIRIN EC LOW DOSE) 81 MG EC tablet Take 81 mg by mouth daily.      . Coenzyme Q10 (COQ-10) 100 MG CAPS Take 200 mg by mouth 3 (three) times daily.     Marland Kitchen donepezil (ARICEPT) 10 MG tablet TAKE 1 TABLET BY MOUTH AT BEDTIME 90 tablet 0  . lactase (LACTAID) 3000 units tablet Take by mouth daily.    Marland Kitchen loratadine (CLARITIN) 10 MG tablet Take 10 mg by mouth daily.     . Multiple Vitamins-Minerals (MULTIVITAL) tablet Take 1 tablet by mouth daily.      . pantoprazole (PROTONIX) 40 MG tablet TAKE 1 TABLET BY MOUTH IN THE MORNING 30 tablet 11  . polyethylene glycol powder (MIRALAX) 17 GM/SCOOP powder Take 17 g by mouth daily. 850 g 1  . pravastatin (PRAVACHOL) 40 MG tablet Take 1 tablet by mouth once daily 30 tablet 5  . simethicone (MYLICON) 194 MG chewable tablet Chew 125 mg by mouth in the morning, at noon, and at bedtime.      No current facility-administered medications on file prior to visit.     Review of Systems  Constitutional: Negative for activity change, appetite change, fatigue, fever and unexpected weight change.  HENT: Positive for ear pain, hearing loss, postnasal drip and sinus pressure. Negative for congestion, ear discharge, facial swelling, rhinorrhea, sinus pain, sore throat, trouble swallowing and voice change.   Eyes: Negative for pain, redness, itching and visual disturbance.  Respiratory: Negative for cough, chest tightness, shortness of breath and wheezing.   Cardiovascular: Negative for chest pain and palpitations.  Gastrointestinal: Negative for abdominal pain, blood in stool, constipation, diarrhea and nausea.  Endocrine: Negative for cold intolerance, heat intolerance, polydipsia and polyuria.  Genitourinary: Negative for difficulty urinating, dysuria, frequency and urgency.  Musculoskeletal: Negative for  arthralgias, joint swelling and myalgias.  Skin: Negative for pallor and rash.  Neurological: Positive for dizziness. Negative for tremors, weakness, numbness and headaches.       Dizzy for a few seconds if he tilts head or looks up  Hematological: Negative for adenopathy. Does not bruise/bleed easily.  Psychiatric/Behavioral: Negative for decreased concentration and dysphoric mood. The patient is not nervous/anxious.        Objective:   Physical Exam Constitutional:      General: He is not in acute distress.    Appearance: Normal appearance. He is well-developed.     Comments: Frail appearing   HENT:     Head: Normocephalic and  atraumatic.     Right Ear: External ear normal.     Left Ear: External ear normal.     Nose: Nose normal.     Mouth/Throat:     Pharynx: No oropharyngeal exudate.  Eyes:     General: No scleral icterus.       Right eye: No discharge.        Left eye: No discharge.     Conjunctiva/sclera: Conjunctivae normal.     Pupils: Pupils are equal, round, and reactive to light.     Comments: Few beats of horizontal nystagmus  He gets dizzy when gazing up (without moving head)   Neck:     Thyroid: No thyromegaly.     Vascular: No carotid bruit or JVD.     Trachea: No tracheal deviation.  Cardiovascular:     Rate and Rhythm: Normal rate and regular rhythm.     Pulses: Normal pulses.     Heart sounds: Normal heart sounds. No murmur heard.      Comments: Nl pedal pulses Feet are warm (has been wearing socks today Pulmonary:     Effort: Pulmonary effort is normal. No respiratory distress.     Breath sounds: Normal breath sounds. No wheezing or rales.  Abdominal:     General: Bowel sounds are normal. There is no distension.     Palpations: Abdomen is soft. There is no mass.     Tenderness: There is no abdominal tenderness.  Musculoskeletal:        General: No tenderness or deformity.     Cervical back: Full passive range of motion without pain, normal range  of motion and neck supple.     Right lower leg: No edema.     Left lower leg: No edema.  Lymphadenopathy:     Cervical: No cervical adenopathy.  Skin:    General: Skin is warm and dry.     Coloration: Skin is not pale.     Findings: No erythema or rash.     Comments: Healthy appearing skin on feet Nails are thickened but well trimmed  Solar lentigines diffusely   Neurological:     Mental Status: He is alert and oriented to person, place, and time.     Cranial Nerves: No cranial nerve deficit, dysarthria or facial asymmetry.     Sensory: Sensation is intact. No sensory deficit.     Motor: No tremor, atrophy or abnormal muscle tone.     Coordination: Coordination normal.     Gait: Gait normal.     Deep Tendon Reflexes: Reflexes are normal and symmetric. Reflexes normal.     Comments: No focal cerebellar signs  Gait is slow but steady    Psychiatric:        Behavior: Behavior normal.        Thought Content: Thought content normal.     Comments: Pt repeats himself a lot and repeatedly consults list in his pocket to remember what to talk about           Assessment & Plan:   Problem List Items Addressed This Visit      Nervous and Auditory   ETD (eustachian tube dysfunction)    Likely from allergies causing nasal congestion  Symptoms of ear pressure and also dizziness with head position change  Reassuring exam  Disc tx of this Will try nasacort ns every other day (he had nosebleed on flonase years ago but does not remember) so will watch for this and stop  it if he develops problems  Also adv to watch for headaches or facial pain or s/s of CVA        Other   Memory change    Noted pt repeated himself many times today during interview       Dizziness    Dizziness with head tilt back or gaze upwards Suspect vertiginous in nature  Will tx ETD with steroid ns (wathcing of nosebleed)  Update if not starting to improve in a week or if worsening   Fall prev discussed       Bilateral cold feet - Primary    Pt notices foot discomfort/stiffness when cold Reassuring exam with good pedal pulses and skin intact today  Suspect OA acts up when he is cold so adv to continue wearing socks and keeping feet warm. Massage with moisturizer helps also He already noticed improvement doing this for several days inst to alert [place] if symptoms worsen again

## 2020-03-07 NOTE — Assessment & Plan Note (Signed)
Likely from allergies causing nasal congestion  Symptoms of ear pressure and also dizziness with head position change  Reassuring exam  Disc tx of this Will try nasacort ns every other day (he had nosebleed on flonase years ago but does not remember) so will watch for this and stop it if he develops problems  Also adv to watch for headaches or facial pain or s/s of CVA

## 2020-03-07 NOTE — Telephone Encounter (Signed)
Noted. I called [facility] to talk with Jeffrey [name] to see what services they could offer.  I LMOVM for her to please call me back.

## 2020-03-07 NOTE — Assessment & Plan Note (Signed)
Noted pt repeated himself many times today during interview

## 2020-03-08 NOTE — Telephone Encounter (Signed)
Please talk to me first.   Please call pt and son separately.  I talked with Marvel Plan.  I thank all involved.    Let pt know that I talked with Mrs. Marvel Plan at [facility] and I asked them to check to see if they could help him, offer any services.  They may be able to help him and they will be calling him.  Thanks.

## 2020-03-08 NOTE — Telephone Encounter (Signed)
I called and spoke with the patient and informed him that per Dr. Damita Dunnings he spoke with Mrs. Marvel Plan at [facility] and I informed him that she will be calling him soon.  He understood.  Macaulay Reicher,cma

## 2020-03-12 ENCOUNTER — Emergency Department (HOSPITAL_COMMUNITY)
Admission: EM | Admit: 2020-03-12 | Discharge: 2020-03-12 | Disposition: A | Discharge status: Home / Self Care | Payer: Medicare Other | Attending: Emergency Medicine | Admitting: Emergency Medicine

## 2020-03-12 ENCOUNTER — Other Ambulatory Visit: Payer: Self-pay

## 2020-03-12 ENCOUNTER — Telehealth: Payer: Self-pay

## 2020-03-12 ENCOUNTER — Emergency Department (HOSPITAL_COMMUNITY): Payer: Medicare Other

## 2020-03-12 DIAGNOSIS — Z79899 Other long term (current) drug therapy: Secondary | ICD-10-CM | POA: Diagnosis not present

## 2020-03-12 DIAGNOSIS — R52 Pain, unspecified: Secondary | ICD-10-CM | POA: Diagnosis not present

## 2020-03-12 DIAGNOSIS — R079 Chest pain, unspecified: Secondary | ICD-10-CM | POA: Diagnosis not present

## 2020-03-12 DIAGNOSIS — R0789 Other chest pain: Secondary | ICD-10-CM | POA: Diagnosis not present

## 2020-03-12 DIAGNOSIS — W19XXXA Unspecified fall, initial encounter: Secondary | ICD-10-CM | POA: Diagnosis not present

## 2020-03-12 DIAGNOSIS — I10 Essential (primary) hypertension: Secondary | ICD-10-CM | POA: Insufficient documentation

## 2020-03-12 DIAGNOSIS — R0602 Shortness of breath: Secondary | ICD-10-CM

## 2020-03-12 DIAGNOSIS — R0781 Pleurodynia: Secondary | ICD-10-CM | POA: Diagnosis not present

## 2020-03-12 DIAGNOSIS — Z87891 Personal history of nicotine dependence: Secondary | ICD-10-CM | POA: Insufficient documentation

## 2020-03-12 DIAGNOSIS — Z85828 Personal history of other malignant neoplasm of skin: Secondary | ICD-10-CM | POA: Diagnosis not present

## 2020-03-12 DIAGNOSIS — Z7982 Long term (current) use of aspirin: Secondary | ICD-10-CM | POA: Insufficient documentation

## 2020-03-12 LAB — CBC WITH DIFFERENTIAL/PLATELET
Abs Immature Granulocytes: 0.03 10*3/uL (ref 0.00–0.07)
Basophils Absolute: 0.1 10*3/uL (ref 0.0–0.1)
Basophils Relative: 1 %
Eosinophils Absolute: 0.1 10*3/uL (ref 0.0–0.5)
Eosinophils Relative: 1 %
HCT: 42.6 % (ref 39.0–52.0)
Hemoglobin: 14.3 g/dL (ref 13.0–17.0)
Immature Granulocytes: 1 %
Lymphocytes Relative: 18 %
Lymphs Abs: 1.1 10*3/uL (ref 0.7–4.0)
MCH: 31.7 pg (ref 26.0–34.0)
MCHC: 33.6 g/dL (ref 30.0–36.0)
MCV: 94.5 fL (ref 80.0–100.0)
Monocytes Absolute: 0.8 10*3/uL (ref 0.1–1.0)
Monocytes Relative: 12 %
Neutro Abs: 4.2 10*3/uL (ref 1.7–7.7)
Neutrophils Relative %: 67 %
Platelets: 169 10*3/uL (ref 150–400)
RBC: 4.51 MIL/uL (ref 4.22–5.81)
RDW: 12.3 % (ref 11.5–15.5)
WBC: 6.3 10*3/uL (ref 4.0–10.5)
nRBC: 0 % (ref 0.0–0.2)

## 2020-03-12 LAB — BASIC METABOLIC PANEL
Anion gap: 8 (ref 5–15)
BUN: 13 mg/dL (ref 8–23)
CO2: 25 mmol/L (ref 22–32)
Calcium: 8.8 mg/dL — ABNORMAL LOW (ref 8.9–10.3)
Chloride: 105 mmol/L (ref 98–111)
Creatinine, Ser: 1.02 mg/dL (ref 0.61–1.24)
GFR, Estimated: >60 mL/min (ref >60)
Glucose, Bld: 88 mg/dL (ref 70–99)
Potassium: 4 mmol/L (ref 3.5–5.1)
Sodium: 138 mmol/L (ref 135–145)

## 2020-03-12 LAB — TROPONIN I (HIGH SENSITIVITY)
Troponin I (High Sensitivity): 5 ng/L (ref <18)
Troponin I (High Sensitivity): 6 ng/L (ref <18)

## 2020-03-12 MED ORDER — ACETAMINOPHEN 325 MG PO TABS
650.0000 mg | ORAL_TABLET | Freq: Once | ORAL | Status: AC
  Administered 2020-03-12: 650 mg via ORAL
  Filled 2020-03-12: qty 2

## 2020-03-12 NOTE — Discharge Planning (Signed)
RNCM has recommended that this patient have [facility] but pt declines at this time. I have discussed the benefits of home health services as wll as the risks of not having home health services with pt. RNCM informed patient that if he changed his mind, his primary care physician can order home health from office.The patient verbalizes understanding. No further RNCM needs identified at this time.

## 2020-03-12 NOTE — Telephone Encounter (Signed)
[facility] Night - Client TELEPHONE ADVICE RECORD [facility] Patient Name: Jeffrey [name] Gender: Male DOB: 18-Nov-1928 Age: 86 Y 43 M 15 D Return Phone Number: [phone] (Primary) Address: [name] [address] Client [place] Night - Client Client Site [place] Physician Renford Dills - MD Contact Type Call Who Is Calling Patient / Member / Family / Caregiver Call Type Triage / Clinical Relationship To Patient Self Return Phone Number [phone] (Primary) Chief Complaint CHEST PAIN (>=21 years) - pain, pressure, heaviness or tightness Reason for Call Symptomatic / Request for [place] states when he breathes, has tightness, especially on the left side of his chest. [place] Not Listed EMS to decide. Translation No Nurse Assessment Nurse: Raenette Rover, RN, Zella Ball Date/Time (Eastern Time): 03/12/2020 6:35:16 AM Confirm and document reason for call. If symptomatic, describe symptoms. ---Caller states when he breathes, has tightness, Started this morning. especially on the left side of his chest. With deep breath feels a pulling feeling. Having trouble taking a deep breath around the rib or under the heart. Has hx allergies. Pain started with waking up this morning. Feels congested on the left side and soreness around the rib. Does the patient have any new or worsening symptoms? ---Yes Will a triage be completed? ---Yes Related visit to physician within the last 2 weeks? ---No Does the PT have any chronic conditions? (i.e. diabetes, asthma, this includes High risk factors for pregnancy, etc.) ---Yes List chronic conditions. ---allergies, Is this a behavioral health or substance abuse call? ---No Guidelines Guideline Title Affirmed Question Affirmed Notes Nurse Date/Time Eilene Ghazi Time) Chest Pain [1] Chest pain lasts > 5 minutes AND [2] age  > 47 Raenette Rover, RN, Zella Ball 03/12/2020 6:38:09 AM Disp. Time Eilene Ghazi Time) Disposition Final User 03/12/2020 6:34:07 AM Send to Urgent Antony Haste 03/12/2020 6:49:25 AM 911 Outcome Documentation Raenette Rover, RN, Zella Ball PLEASE NOTE: All timestamps contained within this report are represented as [place] Standard Time. CONFIDENTIALTY NOTICE: This fax transmission is intended only for the addressee. It contains information that is legally privileged, confidential or otherwise protected from use or disclosure. If you are not the intended recipient, you are strictly prohibited from reviewing, disclosing, copying using or disseminating any of this information or taking any action in reliance on or regarding this information. If you have received this fax in error, please notify us immediately by telephone so that we can arrange for its return to [place]. Phone: [phone], Toll-Free: [phone], Fax: [phone] Page: 2 of 2 Call Id: [id number] [place]. Time Eilene Ghazi Time) Disposition Final User Reason: 911 on the way. 03/12/2020 6:43:21 AM Call EMS 911 Now Yes Raenette Rover, RN, Herbert Deaner Disagree/Comply Comply Caller Understands Yes PreDisposition Did not know what to do Care Advice Given Per Guideline CALL EMS 911 NOW: CARE ADVICE given per Chest Pain (Adult) guideline. * Call EMS 911 first. Referrals GO TO FACILITY OTHER - SPECIFY

## 2020-03-12 NOTE — ED Notes (Signed)
Patient transported to X-ray 

## 2020-03-12 NOTE — Telephone Encounter (Signed)
Awaiting ER notes.  Thanks.

## 2020-03-12 NOTE — ED Triage Notes (Signed)
Pt with left lower rib injury 3 weeks ago. Now having a "tugging" sensation to left lung starting today.

## 2020-03-12 NOTE — ED Provider Notes (Signed)
[facility] DEPART[facility] Provider Note   CSN: [id number] Arrival date & time: 03/12/20  0751     History Chief Complaint  Patient presents with  . Shortness of Breath    Jeffrey [name] is a 86 y.o. male.  The history is provided by the patient.  Chest Pain Pain location:  L chest Pain radiates to:  Does not radiate Pain severity:  Mild Onset quality:  Sudden Duration:  4 hours Timing:  Constant Progression:  Partially resolved Chronicity:  New Context: breathing   Worsened by:  Deep breathing Associated symptoms: shortness of breath   Associated symptoms: no abdominal pain, no back pain, no cough, no dizziness, no fever, no headache, no nausea, no orthopnea, no vomiting and no weakness        Past Medical History:  Diagnosis Date  . Adenomatous polyp of colon 1995    w/ HGD  . Allergic rhinitis   . BPH (benign prostatic hyperplasia)   . Diverticulosis 09/21/2000  . ED (erectile dysfunction)   . Hyperlipemia 05/05/1988  . Memory loss   . Schamberg's purpura    2012- eval by Dr. Allyson Sabal  . Sebaceous cyst   . Skin cancer 05/05/1998   hx of Scalp-melanoma  . TIA (transient ischemic attack)     Patient Active Problem List   Diagnosis Date Noted  . Dizziness 03/07/2020  . Bilateral cold feet 03/07/2020  . Infected sebaceous cyst 01/25/2020  . Rectal bleeding 09/26/2019  . Chronic constipation 09/19/2019  . Nasal sinus congestion 03/08/2019  . Lung nodule 07-Feb-2019  . Death of wife 02/07/19  . Esophageal stenosis 01/09/2017  . Shoulder pain, right 08/24/2016  . Memory change 01/03/2016  . Left ankle pain 11/05/2015  . Advance care planning 12/29/2013  . Allergic rhinitis 12/23/2012  . Medicare annual wellness visit, subsequent 12/22/2011  . History of melanoma 09/22/2011  . Knee pain 08/13/2011  . Paresthesia of foot 08/13/2011  . ETD (eustachian tube dysfunction) 05/01/2011  . ED (erectile dysfunction) 11/14/2010  .  Schamberg's purpura 08/01/2010  . EXTERNAL HEMORRHOIDS 05/02/2010  . HYPERTROPHY PROSTATE W/UR OBST & OTH LUTS 10/09/2009  . ELEVATED BLOOD PRESSURE WITHOUT DIAGNOSIS OF HYPERTENSION 10/03/2008  . TRANSIENT ISCHEMIC ATTACK 08/24/2007  . COLONIC POLYPS, HX OF 08/24/2006  . DIVERTICULOSIS, COLON 09/21/2000  . HLD (hyperlipidemia) 05/05/1988    Past Surgical History:  Procedure Laterality Date  . Carotid U/S Nml  09/08/2007  . Colon polyp  2011   B9 Int Hemms  . Colonoscopy polyps  10/05/2003   Divertics in hemms  . COLONOSCOPY W/ POLYPECTOMY  08/1993; 09/21/2000;10/05/2003   B9  . Echo (other)  09/08/2007   nml lvf EF 50-55% Mild Diast Dysfctn Triv Ar  . melanoma scalp  07/2003  . SEPTOPLASTY  1995  . Skin revision vertex of scalp  2009   Dr. Luetta Nutting, [facility]  . TONSILLECTOMY         Family History  Problem Relation Age of Onset  . Rectal cancer Father   . Colon cancer Paternal Aunt   . Other Paternal Aunt        cancer on back of neck  . Prostate cancer Neg Hx     Social History   Tobacco Use  . Smoking status: Former Smoker    Years: 5.00    Types: Cigarettes    Quit date: 05/05/1958    Years since quitting: 61.8  . Smokeless tobacco: Never Used  Vaping Use  . Vaping  Use: Never used  Substance Use Topics  . Alcohol use: No  . Drug use: No    Home Medications Prior to Admission medications   Medication Sig Start Date End Date Taking? Authorizing Provider  aspirin (BAYER ASPIRIN EC LOW DOSE) 81 MG EC tablet Take 81 mg by mouth daily.      [provider]  Coenzyme Q10 (COQ-10) 100 MG CAPS Take 200 mg by mouth 3 (three) times daily.     [provider]  donepezil (ARICEPT) 10 MG tablet TAKE 1 TABLET BY MOUTH AT BEDTIME 02/13/20   Suzzanne Cloud, NP  lactase (LACTAID) 3000 units tablet Take by mouth daily.    [provider]  loratadine (CLARITIN) 10 MG tablet Take 10 mg by mouth daily.     [provider]  Multiple Vitamins-Minerals  (MULTIVITAL) tablet Take 1 tablet by mouth daily.      [provider]  pantoprazole (PROTONIX) 40 MG tablet TAKE 1 TABLET BY MOUTH IN THE MORNING 05/31/19   Ladene Artist, MD  polyethylene glycol powder (MIRALAX) 17 GM/SCOOP powder Take 17 g by mouth daily. 05/11/19   Ria Bush, MD  pravastatin (PRAVACHOL) 40 MG tablet Take 1 tablet by mouth once daily 02/10/20   Tonia Ghent, MD  simethicone (MYLICON) 465 MG chewable tablet Chew 125 mg by mouth in the morning, at noon, and at bedtime.     [provider]  triamcinolone (NASACORT) 55 MCG/ACT AERO nasal inhaler Place 2 sprays into the nose every other day. 03/07/20   Tower, Wynelle Fanny, MD    Allergies    Flonase [fluticasone propionate] and Tamsulosin  Review of Systems   Review of Systems  Constitutional: Negative for activity change, chills and fever.  HENT: Negative for congestion and sore throat.   Eyes: Negative for visual disturbance.  Respiratory: Positive for shortness of breath. Negative for cough.   Cardiovascular: Positive for chest pain. Negative for orthopnea.  Gastrointestinal: Negative for abdominal pain, diarrhea, nausea and vomiting.  Genitourinary: Negative for difficulty urinating.  Musculoskeletal: Negative for back pain and neck pain.       L shoulder blade pain  Neurological: Negative for dizziness, weakness and headaches.  All other systems reviewed and are negative.   Physical Exam Updated Vital Signs BP 134/67   Pulse 60   Temp 98.7 F (37.1 C) (Oral)   Resp 15   Ht 5\' 7"  (1.702 m)   Wt 63.5 kg   SpO2 98%   BMI 21.93 kg/m   Physical Exam Vitals reviewed.  Constitutional:      General: He is not in acute distress.    Appearance: He is normal weight. He is not toxic-appearing.  HENT:     Head: Normocephalic and atraumatic.     Nose: Nose normal.     Mouth/Throat:     Mouth: Mucous membranes are moist.  Eyes:     Conjunctiva/sclera: Conjunctivae normal.  Cardiovascular:      Heart sounds: Normal heart sounds.  Pulmonary:     Effort: Pulmonary effort is normal. No respiratory distress.     Breath sounds: Normal breath sounds. No wheezing, rhonchi or rales.  Abdominal:     General: Abdomen is flat.     Palpations: Abdomen is soft.     Tenderness: There is no abdominal tenderness.  Musculoskeletal:     Cervical back: Neck supple.     Right lower leg: No edema.     Left lower leg:  No edema.  Skin:    General: Skin is warm and dry.     Comments: 4 cm horizontal bruise on L anterior chest wall  6 cm round bruise on mid lumbar back 2 cm abrasion on L subscapula  Neurological:     Mental Status: He is alert and oriented to person, place, and time.  Psychiatric:        Mood and Affect: Mood normal.        Behavior: Behavior normal.     ED Results / Procedures / Treatments   Labs (all labs ordered are listed, but only abnormal results are displayed) Labs Reviewed  BASIC METABOLIC PANEL - Abnormal; Notable for the following components:      Result Value   Calcium 8.8 (*)    All other components within normal limits  CBC WITH DIFFERENTIAL/PLATELET  TROPONIN I (HIGH SENSITIVITY)  TROPONIN I (HIGH SENSITIVITY)    EKG EKG Interpretation  Date/Time:  Monday March 12 2020 08:03:48 EST Ventricular Rate:  65 PR Interval:    QRS Duration: 91 QT Interval:  414 QTC Calculation: 431 R Axis:   26 Text Interpretation: Sinus rhythm No significant change since last tracing Confirmed by Lajean Saver [phone]) on 03/12/2020 8:09:39 AM   Radiology DG Chest 2 View  Result Date: 03/12/2020 CLINICAL DATA:  Shortness of breath and chest pain EXAM: CHEST - 2 VIEW COMPARISON:  June 09, 2019 FINDINGS: There is mild scarring in the medial right mid lung region. Lungs elsewhere clear. Heart size and pulmonary vascularity are normal. No adenopathy. No bone lesions. IMPRESSION: Mild scarring medial right mid lung. Lungs elsewhere clear. Cardiac silhouette normal.  Electronically Signed   By: Lowella Grip III M.D.   On: 03/12/2020 08:32    Procedures Procedures (including critical care time)  Medications Ordered in ED Medications  acetaminophen (TYLENOL) tablet 650 mg (650 mg Oral Given 03/12/20 1116)    ED Course  I have reviewed the triage vital signs and the nursing notes.  Pertinent labs & imaging results that were available during my care of the patient were reviewed by me and considered in my medical decision making (see chart for details).    MDM Rules/Calculators/A&P                          Medical Decision Making: JERMOND BURKEMPER is a 86 y.o. male who presented to the ED today with chest discomfort in L chest worse with breathing starting this morning upon awakening. Pt points to site of pain on L chest at same location as chest wall bruise, pt reports the bruise is from 1 month ago when he bumped into a table. Pt reports no recent injury. Pt denies cough, nausea, diaphoresis, SOB with exertion, leg swelling, or recent illness including fever or chills. Pt repeats himself many times during interview, repeating that he has seasonal allergies multiple times during interview.     Past medical history significant for eustachian tube dysfunction (likely 2/2 allergies and nasal congestion), hx memory change, TIA Pt had home visit by palliative care (follwed monthly and PRN) SW on 2020/03/10- wife deceased 03/11/19- SW inquired if patient would be open to having someone come into his home and offer support (talking, grocery shopping, light house keeping etc) Patient denied these services. Patient is independent with all ADL's and still drives Per PCP records- infected sebaceous cyst noted on 01/19/20- tx with abx and wound care- 4 PCP follow  up visits for same- at follow up visit on 02/21/20- pt has had lesion on back improving  Reviewed and confirmed nursing documentation for past medical history, family history, social history.  On my  initial exam, the pt was in NAD, resting comfortably, normal WOB, lungs CTAB, bruise on L chest wall at site of pt reported pain and "pulling".  Pt repeats himself many times, self reports some problems with memory but says he is still doing everything at home independently. From chart review, Pt appears to have very good follow up outpt SW (reported monthly visits).    The ECG reveals no anatomical ischemia representing STEMI, New-Onset Arrhythmia, or ischemic equivalent. To further evaluate for ongoing myocardial ischemia, serial troponins will be ordered. The first of these is  not elevated.  Repeat troponin at 3-hours was also not elevated. Therefore, I do not suspect ACS at this time.   The patient's presentation, the patient being hemodynamically stable, and the ECG are not consistent with Pericardial Tamponade. The patient's pain is not positional. This in conjunction with the lack of PR depressions and ST elevations on the ECG are reassuring against Pericarditis. The patient's non-elevated troponin and ECG are also inconsistent with Myocarditis.  The CXR is unremarkable for focal airspace disease.  The patient is afebrile and denies productive cough.  Therefore, I do not suspect Pneumonia. There is no evidence of Pneumothorax on physical exam or on the CXR. CXR shows no evidence of Esophageal Tear and there is no recent intractable emesis or esophageal instrumentation. There is no peritonitis or free air on CXR worrisome for a Perforated Abdominal Viscous.  The patient's pain is not described as tearing and it does not radiate to back. Pulses are present bilaterally in both the upper and lower extremities. CXR does not show a widened mediastinum. I have a very low suspicion for Aortic Dissection.   Consults: none performed  All radiology and laboratory studies reviewed independently and with my attending physician, agree with reading provided by radiologist unless otherwise noted.   Upon  reassessing patient, patient was resting comfortably, NAD,   Based on the above findings, I believe patient is hemodynamically stable for discharge.  Patient/and family educated about specific return precautions for given chief complaint and symptoms.  Patient/and family educated about follow-up with PCP.  Patient/and family expressed understanding of return precautions and need for follow-up.  Patient discharged.   The above care was discussed with and agreed upon by my attending physician.   Emergency Department Medication Summary:  Medications  acetaminophen (TYLENOL) tablet 650 mg (650 mg Oral Given 03/12/20 1116)       Final Clinical Impression(s) / ED Diagnoses Final diagnoses:  Chest pain, unspecified type    Rx / DC Orders ED Discharge Orders    None       Roosevelt Locks, MD 03/12/20 1416    Lajean Saver, MD 03/13/20 [phone]

## 2020-03-12 NOTE — Discharge Planning (Signed)
RNCM consulted regarding transportation needs for pt.  RNCM provided [facility] transportation as pt has no transportation from hospital.  RNCM presented and explained [place] and Release of Liability Form.  Pt signed waiver, therefore agreeing to written terms.  Jeffrey [name]

## 2020-03-12 NOTE — Telephone Encounter (Signed)
Per chart review tab pt is at [facility]. 

## 2020-03-13 ENCOUNTER — Telehealth: Payer: Self-pay

## 2020-03-13 NOTE — Telephone Encounter (Signed)
Attempted call to patients home phone with no answer. Left voicemail for patient to return call. Made call to mobile number listed in the chart. Patients son Jeffrey [name] answered. Per DPR, ok to speak with patients son. Patients son stated that he did not know how is dad was doing because since the visit to the hospital, the patient has been angry and will not talk with his son Jeffrey [name]. Patients son reported that the patient had memory loss and that Jeffrey [name] was speaking with a Education officer, museum to see about placement options to a facility for safety purposes. Jeffrey [name] was very apologetic that he could not offer more assistance for the recent ED follow-up call. Will await call back from patient to check and see how he is doing since the ED visit.

## 2020-03-13 NOTE — Telephone Encounter (Signed)
Noted. Thanks.

## 2020-03-14 DIAGNOSIS — L723 Sebaceous cyst: Secondary | ICD-10-CM | POA: Diagnosis not present

## 2020-03-14 DIAGNOSIS — J309 Allergic rhinitis, unspecified: Secondary | ICD-10-CM | POA: Diagnosis not present

## 2020-03-14 DIAGNOSIS — N400 Enlarged prostate without lower urinary tract symptoms: Secondary | ICD-10-CM | POA: Diagnosis not present

## 2020-03-14 DIAGNOSIS — L03312 Cellulitis of back [any part except buttock]: Secondary | ICD-10-CM | POA: Diagnosis not present

## 2020-03-14 DIAGNOSIS — L817 Pigmented purpuric dermatosis: Secondary | ICD-10-CM | POA: Diagnosis not present

## 2020-03-14 DIAGNOSIS — E785 Hyperlipidemia, unspecified: Secondary | ICD-10-CM | POA: Diagnosis not present

## 2020-03-21 ENCOUNTER — Telehealth: Payer: Self-pay | Admitting: Family Medicine

## 2020-03-21 NOTE — Telephone Encounter (Signed)
Received call today from  Mrs. Marvel Plan at [facility].  I appreciate her effort and contact.  She has been in contact with the patient and the patient's son.  Per her report, the patient's son is looking into guardianship.  She has had detailed conversations with the patient about his current situation.  She talked to him about extra resources in the community versus assisted living.  He wants to stay at home currently.  She does not see a clear avenue for intervention or help at this point that the patient will accept.  It appears that he is able to function independently enough to stay at home right now but that there is the expectation of some decline in the future.  We agreed to periodically check in on the patient.  We can see about contacting the patient at or around the beginning of each month and she can check with the patient around the middle of the month.  Each of [place] can contact the other should we find an actionable issue.  She agrees with that plan.  I appreciate the help of all involved.

## 2020-03-23 DIAGNOSIS — L723 Sebaceous cyst: Secondary | ICD-10-CM | POA: Diagnosis not present

## 2020-03-23 DIAGNOSIS — N400 Enlarged prostate without lower urinary tract symptoms: Secondary | ICD-10-CM | POA: Diagnosis not present

## 2020-03-23 DIAGNOSIS — J309 Allergic rhinitis, unspecified: Secondary | ICD-10-CM | POA: Diagnosis not present

## 2020-03-23 DIAGNOSIS — L817 Pigmented purpuric dermatosis: Secondary | ICD-10-CM | POA: Diagnosis not present

## 2020-03-23 DIAGNOSIS — L03312 Cellulitis of back [any part except buttock]: Secondary | ICD-10-CM | POA: Diagnosis not present

## 2020-03-23 DIAGNOSIS — E785 Hyperlipidemia, unspecified: Secondary | ICD-10-CM | POA: Diagnosis not present

## 2020-03-26 ENCOUNTER — Telehealth: Payer: Self-pay | Admitting: Family Medicine

## 2020-03-26 NOTE — Telephone Encounter (Signed)
FYI Called and spoke with patient. Patient states that there are nurses coming to his house and are checking his blood pressure. According to the nurses, his blood pressures have been good, but he cannot recall what they were. Instructed patient to call back if he has any further questions or concerns.

## 2020-03-26 NOTE — Telephone Encounter (Signed)
Noted. Thanks.

## 2020-03-26 NOTE — Telephone Encounter (Signed)
Pt called in wanted to know about receiving a phone about his BP being high and he stated that the nurse that comes in and they said that his BP was good.

## 2020-03-27 ENCOUNTER — Other Ambulatory Visit: Payer: Self-pay | Admitting: Family Medicine

## 2020-04-03 ENCOUNTER — Telehealth: Payer: Self-pay

## 2020-04-03 NOTE — Telephone Encounter (Signed)
2:30PM: Palliative care SW outreached patient to complete telephonic visit.  Palliative care SW outreached patient to complete telephonic visit. Patient provided update on medical condition and/or changes. Patient shared that he is "making it". Patient stated that he went to the ER a few weeks ago due to chest pains, but that he has not had any chest pains since then. Patient shared that he thinks his BP were high then stated that he thinks they actually have been fine because he feels okay. Patient shared that he is taking all meds as he should. Patient shared that he continues to drive to [employer] every morning for breakfast. Patient is no longer receiving [facility] RN services, due to wound on back being healed. No other psychosocial needs. Patient appreciative of telephonic check in.   Plan: Palliative care will continue to monitor and assist with long term care planning as needed. Next in home visit scheduled for 12/8 @10am .

## 2020-04-04 ENCOUNTER — Telehealth: Payer: Self-pay | Admitting: Family Medicine

## 2020-04-04 NOTE — Telephone Encounter (Signed)
Please talk to me about this patient before you call him.  I would like you to check on him to see about his current status and home situation.  Thanks.

## 2020-04-09 NOTE — Telephone Encounter (Signed)
Please talk to me about this tomorrow regarding a referral to GI.

## 2020-04-09 NOTE — Telephone Encounter (Signed)
Ok to place referral to GI

## 2020-04-11 ENCOUNTER — Other Ambulatory Visit: Payer: Medicare Other

## 2020-04-11 ENCOUNTER — Other Ambulatory Visit: Payer: Self-pay

## 2020-04-11 VITALS — BP 130/64 | HR 64 | Temp 99.5°F | Resp 18

## 2020-04-11 DIAGNOSIS — Z515 Encounter for palliative care: Secondary | ICD-10-CM
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
## 2020-04-18 NOTE — Telephone Encounter (Signed)
Thanks

## 2020-04-18 NOTE — Telephone Encounter (Signed)
Spoke with pt and he stated that at the moment he seems to be doing just fine. The nurse came in to check is BP yesterday and it seems to be ok

## 2020-04-18 NOTE — Telephone Encounter (Signed)
LVM for pt to cb to the office

## 2020-04-20 ENCOUNTER — Telehealth: Payer: Self-pay

## 2020-04-20 NOTE — Telephone Encounter (Signed)
Volunteer called patient on behalf of Palliative Care and patient is doing well.  

## 2020-05-04 ENCOUNTER — Encounter (HOSPITAL_COMMUNITY): Payer: Self-pay | Admitting: Emergency Medicine

## 2020-05-04 ENCOUNTER — Other Ambulatory Visit: Payer: Self-pay

## 2020-05-04 DIAGNOSIS — Z87891 Personal history of nicotine dependence: Secondary | ICD-10-CM | POA: Insufficient documentation

## 2020-05-04 DIAGNOSIS — Z7982 Long term (current) use of aspirin: Secondary | ICD-10-CM | POA: Insufficient documentation

## 2020-05-04 DIAGNOSIS — R0981 Nasal congestion: Secondary | ICD-10-CM | POA: Diagnosis present

## 2020-05-04 DIAGNOSIS — M25511 Pain in right shoulder: Secondary | ICD-10-CM | POA: Diagnosis not present

## 2020-05-04 DIAGNOSIS — R066 Hiccough: Secondary | ICD-10-CM | POA: Diagnosis not present

## 2020-05-04 DIAGNOSIS — U071 COVID-19: Secondary | ICD-10-CM | POA: Insufficient documentation

## 2020-05-04 DIAGNOSIS — Z85828 Personal history of other malignant neoplasm of skin: Secondary | ICD-10-CM | POA: Insufficient documentation

## 2020-05-04 NOTE — ED Triage Notes (Signed)
Pt BIB [facility] from home. Reports ear, head, and nose congestion x 1 week. And epigastric discomfort. Denies N/V. Also reports that he cant sleep because of R shoulder discomfort that he says his PCP has already evaluated. Hx of allergies.  Son [phone] Sons Wife [phone]

## 2020-05-05 ENCOUNTER — Emergency Department (HOSPITAL_COMMUNITY): Payer: Medicare Other

## 2020-05-05 ENCOUNTER — Emergency Department (HOSPITAL_COMMUNITY)
Admission: EM | Admit: 2020-05-05 | Discharge: 2020-05-05 | Disposition: A | Discharge status: Home / Self Care | Payer: Medicare Other | Attending: Emergency Medicine | Admitting: Emergency Medicine

## 2020-05-05 DIAGNOSIS — R279 Unspecified lack of coordination: Secondary | ICD-10-CM | POA: Diagnosis not present

## 2020-05-05 DIAGNOSIS — Z743 Need for continuous supervision: Secondary | ICD-10-CM | POA: Diagnosis not present

## 2020-05-05 DIAGNOSIS — M25519 Pain in unspecified shoulder: Secondary | ICD-10-CM | POA: Diagnosis not present

## 2020-05-05 DIAGNOSIS — M25511 Pain in right shoulder: Secondary | ICD-10-CM

## 2020-05-05 DIAGNOSIS — I499 Cardiac arrhythmia, unspecified: Secondary | ICD-10-CM | POA: Diagnosis not present

## 2020-05-05 DIAGNOSIS — U071 COVID-19: Secondary | ICD-10-CM | POA: Diagnosis not present

## 2020-05-05 LAB — RESP PANEL BY RT-PCR (FLU A&B, COVID) ARPGX2
Influenza A by PCR: NEGATIVE
Influenza B by PCR: NEGATIVE
SARS Coronavirus 2 by RT PCR: POSITIVE — AB

## 2020-05-05 NOTE — ED Notes (Signed)
Date and time results received: 05/05/20 12:52 AM (use smartphrase ".now" to insert current time)  Test: Covid-19 Critical Value: Positive  Name of Provider Notified: Delo

## 2020-05-05 NOTE — ED Provider Notes (Signed)
[facility]-EMERGENCY DEPT Provider Note   CSN: [id number] Arrival date & time: 05/04/20  2212     History Chief Complaint  Patient presents with  . Nasal Congestion    Jeffrey [name] is a 87 y.o. male.  HPI  Patient is an 87 year old male with a history of memory problems as well as a history of scalp melanoma, TIA, rectal dysfunction, HLD BPH and allergic rhinitis.  Patient states that he came to the ER yesterday because of "allergies "he also states that his right shoulder has been hurting for several months and states that has already been lifted by his primary care doctor but he states this is still achy.  He vehemently denies any other associated symptoms.  He denies any chest pain, shortness of breath, fevers, chills, cough, worsening congestion-although he does state that his allergies seem to be acting up somewhat.  He is seen by a primary care doctor at [facility] where patient states that he had an x-ray done of his shoulder several months ago when he first fell.  I talked with the patient's son who lives in [place].  Son states that patient become increasingly resistant to recommendations to have further home support.  Patient does have a home health nurse who comes twice weekly to check in on patient--patient states he lives by himself.      Past Medical History:  Diagnosis Date  . Adenomatous polyp of colon 1995    w/ HGD  . Allergic rhinitis   . BPH (benign prostatic hyperplasia)   . Diverticulosis 09/21/2000  . ED (erectile dysfunction)   . Hyperlipemia 05/05/1988  . Memory loss   . Schamberg's purpura    2012- eval by Dr. Terri Piedra  . Sebaceous cyst   . Skin cancer 05/05/1998   hx of Scalp-melanoma  . TIA (transient ischemic attack)     Patient Active Problem List   Diagnosis Date Noted  . Dizziness 03/07/2020  . Bilateral cold feet 03/07/2020  . Infected sebaceous cyst 01/25/2020  . Rectal bleeding 09/26/2019  . Chronic  constipation 09/19/2019  . Nasal sinus congestion 03/08/2019  . Lung nodule 02/27/2019  . Death of wife 02-27-2019  . Esophageal stenosis 01/09/2017  . Shoulder pain, right 08/24/2016  . Memory change 01/03/2016  . Left ankle pain 11/05/2015  . Advance care planning 12/29/2013  . Allergic rhinitis 12/23/2012  . Medicare annual wellness visit, subsequent 12/22/2011  . History of melanoma 09/22/2011  . Knee pain 08/13/2011  . Paresthesia of foot 08/13/2011  . ETD (eustachian tube dysfunction) 05/01/2011  . ED (erectile dysfunction) 11/14/2010  . Schamberg's purpura 08/01/2010  . EXTERNAL HEMORRHOIDS 05/02/2010  . HYPERTROPHY PROSTATE W/UR OBST & OTH LUTS 10/09/2009  . ELEVATED BLOOD PRESSURE WITHOUT DIAGNOSIS OF HYPERTENSION 10/03/2008  . TRANSIENT ISCHEMIC ATTACK 08/24/2007  . COLONIC POLYPS, HX OF 08/24/2006  . DIVERTICULOSIS, COLON 09/21/2000  . HLD (hyperlipidemia) 05/05/1988    Past Surgical History:  Procedure Laterality Date  . Carotid U/S Nml  09/08/2007  . Colon polyp  2011   B9 Int Hemms  . Colonoscopy polyps  10/05/2003   Divertics in hemms  . COLONOSCOPY W/ POLYPECTOMY  08/1993; 09/21/2000;10/05/2003   B9  . Echo (other)  09/08/2007   nml lvf EF 50-55% Mild Diast Dysfctn Triv Ar  . melanoma scalp  07/2003  . SEPTOPLASTY  1995  . Skin revision vertex of scalp  2009   Dr. Dawna Part, Pollyann Savoy  . TONSILLECTOMY  Family History  Problem Relation Age of Onset  . Rectal cancer Father   . Colon cancer Paternal Aunt   . Other Paternal Aunt        cancer on back of neck  . Prostate cancer Neg Hx     Social History   Tobacco Use  . Smoking status: Former Smoker    Years: 5.00    Types: Cigarettes    Quit date: 05/05/1958    Years since quitting: 62.0  . Smokeless tobacco: Never Used  Vaping Use  . Vaping Use: Never used  Substance Use Topics  . Alcohol use: No  . Drug use: No    Home Medications Prior to Admission medications   Medication Sig Start Date End  Date Taking? Authorizing Provider  aspirin (BAYER ASPIRIN EC LOW DOSE) 81 MG EC tablet Take 81 mg by mouth daily.      [provider]  Coenzyme Q10 (COQ-10) 100 MG CAPS Take 200 mg by mouth 3 (three) times daily.     [provider]  donepezil (ARICEPT) 10 MG tablet TAKE 1 TABLET BY MOUTH AT BEDTIME 02/13/20   Suzzanne Cloud, NP  lactase (LACTAID) 3000 units tablet Take by mouth daily.    [provider]  loratadine (CLARITIN) 10 MG tablet Take 10 mg by mouth daily.     [provider]  Multiple Vitamins-Minerals (MULTIVITAL) tablet Take 1 tablet by mouth daily.      [provider]  pantoprazole (PROTONIX) 40 MG tablet TAKE 1 TABLET BY MOUTH IN THE MORNING 05/31/19   Ladene Artist, MD  polyethylene glycol powder (MIRALAX) 17 GM/SCOOP powder Take 17 g by mouth daily. 05/11/19   Ria Bush, MD  pravastatin (PRAVACHOL) 40 MG tablet Take 1 tablet by mouth once daily 03/27/20   Tonia Ghent, MD  simethicone (MYLICON) 0000000 MG chewable tablet Chew 125 mg by mouth in the morning, at noon, and at bedtime.     [provider]  triamcinolone (NASACORT) 55 MCG/ACT AERO nasal inhaler Place 2 sprays into the nose every other day. 03/07/20   Tower, Wynelle Fanny, MD    Allergies    Flonase [fluticasone propionate] and Tamsulosin  Review of Systems   Review of Systems  Constitutional: Negative for chills and fever.  HENT: Negative for congestion.        Patient denies any congestion while also stating that he feels his allergies are worse  Eyes: Negative for pain.  Respiratory: Negative for cough and shortness of breath.   Cardiovascular: Negative for chest pain and leg swelling.  Gastrointestinal: Negative for abdominal pain and vomiting.  Genitourinary: Negative for dysuria.  Musculoskeletal: Negative for myalgias.       Right shoulder pain  Skin: Negative for rash.  Neurological: Negative for dizziness and headaches.    Physical  Exam Updated Vital Signs BP 122/65   Pulse 74   Temp 98.3 F (36.8 C) (Oral)   Resp 16   SpO2 99%   Physical Exam Vitals and nursing note reviewed.  Constitutional:      General: He is not in acute distress.    Comments: Pleasant well-appearing 87 year old.  In no acute distress.  Sitting comfortably in bed.  Seems mildly demented.  Has poor insight. No increased work of breathing. Speaking in full sentences.  HENT:     Head: Normocephalic and atraumatic.     Nose: Nose normal.  Eyes:     General: No scleral icterus. Cardiovascular:  Rate and Rhythm: Normal rate and regular rhythm.     Pulses: Normal pulses.     Heart sounds: Normal heart sounds.  Pulmonary:     Effort: Pulmonary effort is normal. No respiratory distress.     Breath sounds: Normal breath sounds. No wheezing.  Abdominal:     Palpations: Abdomen is soft.     Tenderness: There is no abdominal tenderness. There is no guarding or rebound.  Musculoskeletal:     Cervical back: Normal range of motion.     Right lower leg: No edema.     Left lower leg: No edema.     Comments: Range of motion intact bilateral shoulders elbows and wrists.  There is some mild tenderness of the right AC joint which does seem to have some mild separation.  Skin:    General: Skin is warm and dry.     Capillary Refill: Capillary refill takes less than 2 seconds.  Neurological:     Mental Status: He is alert. Mental status is at baseline.  Psychiatric:     Comments: Extremely poor insight.  When told he tested positive for Covid he states that he does not have Covid.  He states that he has allergies.  States he could not have Covid because he was tested before and told he did not have it.  Does not seem to understand the nature of the disease.     ED Results / Procedures / Treatments   Labs (all labs ordered are listed, but only abnormal results are displayed) Labs Reviewed  RESP PANEL BY RT-PCR (FLU A&B, COVID) ARPGX2 - Abnormal;  Notable for the following components:      Result Value   SARS Coronavirus 2 by RT PCR POSITIVE (*)    All other components within normal limits    EKG None  Radiology No results found.  Procedures Procedures (including critical care time)  Medications Ordered in ED Medications - No data to display  ED Course  I have reviewed the triage vital signs and the nursing notes.  Pertinent labs & imaging results that were available during my care of the patient were reviewed by me and considered in my medical decision making (see chart for details).    MDM Rules/Calculators/A&P                         Patient is a 87 year old male tested positive for COVID-19 today.  He was brought in by EMS after he called 911 because of an achy right shoulder pain has been ongoing for several months as well as allergies.  He denies any symptoms presently on my examination.  He steadfastly denies any possibility of him being positive for Covid stating that he tested negative in the past.  He has poor insight but otherwise very well-appearing.  Right shoulder appears to have some right AC joint separation however he has had x-rays done at PCP.  I offered x-ray to him in order to this however he declined to have x-ray done.  To be discharged at this time.  He is mentating well.  ANO x4 although with severe memory issues I did reason to detain patient for further evaluation work-up.  Mesiah Macak Gethers was evaluated in Emergency Department on 05/05/2020 for the symptoms described in the history of present illness. He was evaluated in the context of the global COVID-19 pandemic, which necessitated consideration that the patient might be at risk for infection with the  SARS-CoV-2 virus that causes COVID-19. Institutional protocols and algorithms that pertain to the evaluation of patients at risk for COVID-19 are in a state of rapid change based on information released by regulatory bodies including the CDC and federal  and state organizations. These policies and algorithms were followed during the patient's care in the ED.  I discussed this case with my attending physician who cosigned this note including patient's presenting symptoms, physical exam, and planned diagnostics and interventions. Attending physician stated agreement with plan or made changes to plan which were implemented.   I have also discussed the case with patient's son over the phone.  Patient son is understanding. Plan to discharge.   Return precautions given.  He is understanding of plan.  Final Clinical Impression(s) / ED Diagnoses Final diagnoses:  [id number]  Right shoulder pain, unspecified chronicity    Rx / DC Orders ED Discharge Orders    None       Tedd Sias, [place] 05/05/20 1048    Pattricia Boss, MD 05/05/20 1429

## 2020-05-05 NOTE — Discharge Instructions (Addendum)
As we discussed your Covid test was positive today. Even though you have explained that you are not having any symptoms with a positive Covid test and your "allergy "symptoms is very possible to transmit the virus to other people.  For this reason please wash your hands continue to wear a mask and quarantine at home. Please follow-up with your primary care doctor. I strongly recommend allowing her son and her primary care doctor to set you up with more frequent home health check-in's by nursing staff. If you have any new or concerning symptoms including difficulty breathing please return to the ER.  Please continue take your prescribed medications as directed.

## 2020-05-07 ENCOUNTER — Telehealth: Payer: Self-pay

## 2020-05-07 NOTE — Telephone Encounter (Signed)
Pt contacted by phone and advised he does have enough food in home for today. Advised this nurse is working on getting him some help on food during his quarantine. Pt reports he goes to [employer] in the morning for groceries and for lunch he goes into [employer] for a fish sandwich and he eats a cheese burger for dinner. He currently has 5 burgers in the freezer. Pt is overly concerned about where he may have picked up the virus. Advised he needs to quarantine for 10 days from 05/05/20 of his diagnosis. Pt reports he does have enough medication for this time period. Advised pt this nurse will f/u with further information. Pt verbalized understanding.    Contacted Willette Pa, RN at [facility], the nurse that came for palliative care for pt. She comes monthly to see pt. Advised her of situation. She is going to contact their social worker, [place], to see what resources they may have to help the pt. She reports also they have a food pantry and can bring the pt food. She will contact the pt and let him know they will bring him food. Gave her number to this nurse's office. Advised if anything is needed please contact this office.

## 2020-05-07 NOTE — Telephone Encounter (Signed)
Contacted pt who reported a male did contact him about 4pm and said she would bring food to him this evening and leave it on the porch for him but he cannot remember her name. He also said he has a hard time remembering anything with everything going on. Pt reports he has medication for tonight, he has food and electricity is on and he has heat. Pt denies any symptoms. Advised pt of ER precautions and that this nurse will f/u with him tomorrow but if he needed anything to call 911. Pt said he has called 911 in the past and he would be able to do it again if needed. Pt verbalized understanding.
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
## 2020-05-07 NOTE — Progress Notes (Signed)
121 pm.  Incoming call from [place], RN who provides an update on patient being COVID +.  Patient came to the office to have his feet check but was sent back to his car due to recent diagnosis.  Patient is displaying significant memory deficits and does not understand his diagnosis and quarantining.  Patient is also in need of food. [facility] does have a food pantry and food can be taken to patient today.  Advised that I would also notify [place], SW for Palliative Care of patient situation.  134 pm.  Phone call made to [place], SW and advised of patient's current diagnosis and worsening cognition.   Advised that if additional resources could be offered to patient it would be beneficial.  440 pm.  Phone call completed to patient.  He denied going to the ED on 05/05/20 and denies COVID-19 diagnosis.  Patient states he has allergies.  I provided information to patient on his recent ED visit and being tested for COVID.  Patient then stated he recalls going for his feet to be checked.  He does not remember being tested but I have explained this virus and need to quarantine. He is currently denying any pain, shortness of breath, cough or fever. Patient states his food supply is running low and he is worried about obtaining items while in quarantine.  Patient only eats microwave food and does not cook.  He has 2 bananas left, no muffins, 2 boiled eggs and 4 cheese burgers in his freezer.  Patient typically drives to [employer] for a fish filet sandwich and drink but he did not do this today.  Patient states he did not eat lunch today.  Patient only eats the listed items and nothing more.  He is asking for these items only since he is not cooking or eating canned food.  Advised that items would be obtained and brought to his home.  Patient is concerned about paying for items but I have assured him there is not cost to him.  515pm.  Blueberry muffins, 2 bags of boiled eggs, 1 box of frozen cheese burgers and  bananas are delivered to patient's porch.  Door bell rang and patient came to his door with his mask in place.  Patient voiced his appreciation for groceries.  I have advised patient that I would call him tomorrow to check in.  No other needs are voiced at this time.

## 2020-05-08 ENCOUNTER — Telehealth: Payer: Self-pay
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
## 2020-05-08 NOTE — Telephone Encounter (Addendum)
9 am.  Check in call completed with patient.  He reports having pressure, burning to his chest.  He is coughing but no production.  Patient states he has pain from his stomach up to his head.  Notes throat irritation.  Patient ate his eggs, cheese crackers and a banana this morning.  Patient believes if he has a fish sandwich and tea from [employer] he will feel better.  I have attempted to explain COVID to patient but he is not understanding this diagnosis.  I have advised patient that he is able to activate 911 should his condition worsen.  Patient did not believe he was able to call 911 due to his condition.    923 am.  Phone call made to [facility], RN with Corinda Gubler.  Message left with update on patient's condition.  1045 am.  Fish filet sandwich and tea delivered to patient's home.  I have provided a business card for Palliative Care where I can be reached. Patient is instructed to call should additional assistance is needed.   I have re-enforced calling 911 should symptoms worsen.  Patient's O2 sats are 98% on RA and HR is 82.  3pm.  Phone call made to patient to follow up on his condition.  Patient was able to eat lunch and drink liquids.  He states he is feeling okay right now.  He is going to eat dinner later this evening and is talking about eating some apple sauce for a snack.  No breathing difficulties are reported.  I have encouraged patient to keep himself hydrated. Patient verbalized understanding.  Patient states he is watching television now.  He is aware he can call Palliative Care with questions/needs.  Advised patient that I would follow up again tomorrow to check on his overall condition.

## 2020-05-08 NOTE — Progress Notes (Signed)
Palliative care SW made APS report, based on safety concerns of patient due to apparent cognitive impairment.

## 2020-05-08 NOTE — Telephone Encounter (Signed)
Agreed.  Thanks.  

## 2020-05-09 ENCOUNTER — Telehealth: Payer: Self-pay

## 2020-05-09 NOTE — Telephone Encounter (Signed)
Noted. Thanks.

## 2020-05-09 NOTE — Telephone Encounter (Signed)
9 am.  Phone call made to patient to assess overall condition.  Patient states he is feeling a little groggy today.  No complaints of shortness of breath.  He is reporting a little congestion.  Patient is eating and drinking but I believe less than normal.  Patient notes the food is tasting different.  Education provided on COVID virus and loss of taste and smell.  Encouraged patient to continue eating and drinking.  He continues to believe this is all allergy related.   I have re-enforced isolation.  Advised patient that I would follow up again tomorrow.  Advised to call with any concerns/questions.

## 2020-05-10 ENCOUNTER — Telehealth: Payer: Self-pay

## 2020-05-10 NOTE — Telephone Encounter (Signed)
1 pm.  Received an email for [facility] on-call staff this am stating EMS was called to patient's home around 9 last night.  Patient was assessed and felt to be stable and in no distress.  Patient did not transport to the ED.  Telephone check in completed with patient and he states he is feeling better today.  He does recall EMS visiting last night.  States staff checked him out and he was okay.  Patient states he is watching television and relaxing right now.  He is denying any pain, shortness of breath, cough or fever.  He does feel there is some nasal congestion but otherwise no other issues.  He notes his po intake has been poor and states the food taste different.  Ongoing education provided on loss of taste and smell with the COVID virus.  Patient is encouraged to eat and states he has consumed yogurt, apple sauce and is drinking water.  Patient denies any needs at this time.  Advised that I would do a follow up again tomorrow.

## 2020-05-11 ENCOUNTER — Other Ambulatory Visit: Payer: Self-pay

## 2020-05-11 ENCOUNTER — Inpatient Hospital Stay (HOSPITAL_COMMUNITY)
Admission: EM | Admit: 2020-05-11 | Discharge: 2020-05-15 | DRG: 640 | Disposition: A | Discharge status: Home Health | Payer: Medicare Other | Attending: Family Medicine | Admitting: Family Medicine

## 2020-05-11 ENCOUNTER — Encounter (HOSPITAL_COMMUNITY): Payer: Self-pay

## 2020-05-11 ENCOUNTER — Other Ambulatory Visit: Payer: Medicare Other

## 2020-05-11 ENCOUNTER — Emergency Department (HOSPITAL_COMMUNITY): Payer: Medicare Other

## 2020-05-11 VITALS — BP 122/78 | HR 72 | Temp 98.1°F | Resp 24

## 2020-05-11 DIAGNOSIS — Z85820 Personal history of malignant melanoma of skin: Secondary | ICD-10-CM

## 2020-05-11 DIAGNOSIS — R627 Adult failure to thrive: Secondary | ICD-10-CM | POA: Diagnosis present

## 2020-05-11 DIAGNOSIS — Z7982 Long term (current) use of aspirin: Secondary | ICD-10-CM

## 2020-05-11 DIAGNOSIS — Z888 Allergy status to other drugs, medicaments and biological substances status: Secondary | ICD-10-CM | POA: Diagnosis not present

## 2020-05-11 DIAGNOSIS — Z87891 Personal history of nicotine dependence: Secondary | ICD-10-CM | POA: Diagnosis not present

## 2020-05-11 DIAGNOSIS — E871 Hypo-osmolality and hyponatremia: Secondary | ICD-10-CM | POA: Diagnosis not present

## 2020-05-11 DIAGNOSIS — R197 Diarrhea, unspecified: Secondary | ICD-10-CM | POA: Diagnosis present

## 2020-05-11 DIAGNOSIS — R410 Disorientation, unspecified: Secondary | ICD-10-CM | POA: Diagnosis not present

## 2020-05-11 DIAGNOSIS — Z743 Need for continuous supervision: Secondary | ICD-10-CM | POA: Diagnosis not present

## 2020-05-11 DIAGNOSIS — Z79899 Other long term (current) drug therapy: Secondary | ICD-10-CM | POA: Diagnosis not present

## 2020-05-11 DIAGNOSIS — F0390 Unspecified dementia without behavioral disturbance: Secondary | ICD-10-CM | POA: Diagnosis present

## 2020-05-11 DIAGNOSIS — Z8673 Personal history of transient ischemic attack (TIA), and cerebral infarction without residual deficits: Secondary | ICD-10-CM | POA: Diagnosis not present

## 2020-05-11 DIAGNOSIS — Z209 Contact with and (suspected) exposure to unspecified communicable disease: Secondary | ICD-10-CM | POA: Diagnosis not present

## 2020-05-11 DIAGNOSIS — N400 Enlarged prostate without lower urinary tract symptoms: Secondary | ICD-10-CM | POA: Diagnosis present

## 2020-05-11 DIAGNOSIS — R6889 Other general symptoms and signs: Secondary | ICD-10-CM | POA: Diagnosis not present

## 2020-05-11 DIAGNOSIS — R413 Other amnesia: Secondary | ICD-10-CM | POA: Diagnosis present

## 2020-05-11 DIAGNOSIS — N179 Acute kidney failure, unspecified: Secondary | ICD-10-CM | POA: Diagnosis present

## 2020-05-11 DIAGNOSIS — K449 Diaphragmatic hernia without obstruction or gangrene: Secondary | ICD-10-CM | POA: Diagnosis not present

## 2020-05-11 DIAGNOSIS — Z6822 Body mass index (BMI) 22.0-22.9, adult: Secondary | ICD-10-CM

## 2020-05-11 DIAGNOSIS — U071 COVID-19: Secondary | ICD-10-CM | POA: Diagnosis present

## 2020-05-11 DIAGNOSIS — E876 Hypokalemia: Secondary | ICD-10-CM | POA: Diagnosis present

## 2020-05-11 DIAGNOSIS — E785 Hyperlipidemia, unspecified: Secondary | ICD-10-CM | POA: Diagnosis present

## 2020-05-11 DIAGNOSIS — E860 Dehydration: Secondary | ICD-10-CM | POA: Diagnosis present

## 2020-05-11 DIAGNOSIS — Z515 Encounter for palliative care: Secondary | ICD-10-CM

## 2020-05-11 HISTORY — DX: Hypokalemia: E87.6

## 2020-05-11 HISTORY — DX: Hypo-osmolality and hyponatremia: E87.1

## 2020-05-11 LAB — COMPREHENSIVE METABOLIC PANEL
ALT: 26 U/L (ref 0–44)
AST: 41 U/L (ref 15–41)
Albumin: 3.6 g/dL (ref 3.5–5.0)
Alkaline Phosphatase: 54 U/L (ref 38–126)
Anion gap: 11 (ref 5–15)
BUN: 16 mg/dL (ref 8–23)
CO2: 20 mmol/L — ABNORMAL LOW (ref 22–32)
Calcium: 8.3 mg/dL — ABNORMAL LOW (ref 8.9–10.3)
Chloride: 95 mmol/L — ABNORMAL LOW (ref 98–111)
Creatinine, Ser: 1.18 mg/dL (ref 0.61–1.24)
GFR, Estimated: 59 mL/min — ABNORMAL LOW (ref >60)
Glucose, Bld: 91 mg/dL (ref 70–99)
Potassium: 3.3 mmol/L — ABNORMAL LOW (ref 3.5–5.1)
Sodium: 126 mmol/L — ABNORMAL LOW (ref 135–145)
Total Bilirubin: 0.8 mg/dL (ref 0.3–1.2)
Total Protein: 6.1 g/dL — ABNORMAL LOW (ref 6.5–8.1)

## 2020-05-11 LAB — CBC WITH DIFFERENTIAL/PLATELET
Abs Immature Granulocytes: 0.03 10*3/uL (ref 0.00–0.07)
Basophils Absolute: 0 10*3/uL (ref 0.0–0.1)
Basophils Relative: 0 %
Eosinophils Absolute: 0 10*3/uL (ref 0.0–0.5)
Eosinophils Relative: 0 %
HCT: 36.9 % — ABNORMAL LOW (ref 39.0–52.0)
Hemoglobin: 13.3 g/dL (ref 13.0–17.0)
Immature Granulocytes: 1 %
Lymphocytes Relative: 13 %
Lymphs Abs: 0.7 10*3/uL (ref 0.7–4.0)
MCH: 32 pg (ref 26.0–34.0)
MCHC: 36 g/dL (ref 30.0–36.0)
MCV: 88.9 fL (ref 80.0–100.0)
Monocytes Absolute: 1 10*3/uL (ref 0.1–1.0)
Monocytes Relative: 19 %
Neutro Abs: 3.5 10*3/uL (ref 1.7–7.7)
Neutrophils Relative %: 67 %
Platelets: 141 10*3/uL — ABNORMAL LOW (ref 150–400)
RBC: 4.15 MIL/uL — ABNORMAL LOW (ref 4.22–5.81)
RDW: 12 % (ref 11.5–15.5)
WBC: 5.2 10*3/uL (ref 4.0–10.5)
nRBC: 0 % (ref 0.0–0.2)

## 2020-05-11 LAB — TRIGLYCERIDES: Triglycerides: 77 mg/dL (ref <150)

## 2020-05-11 LAB — C-REACTIVE PROTEIN: CRP: 0.6 mg/dL (ref <1.0)

## 2020-05-11 LAB — URINALYSIS, ROUTINE W REFLEX MICROSCOPIC
Bilirubin Urine: NEGATIVE
Glucose, UA: NEGATIVE mg/dL
Hgb urine dipstick: NEGATIVE
Ketones, ur: 5 mg/dL — AB
Leukocytes,Ua: NEGATIVE
Nitrite: NEGATIVE
Protein, ur: NEGATIVE mg/dL
Specific Gravity, Urine: 1.008 (ref 1.005–1.030)
pH: 6 (ref 5.0–8.0)

## 2020-05-11 LAB — FERRITIN: Ferritin: 76 ng/mL (ref 24–336)

## 2020-05-11 LAB — FIBRINOGEN: Fibrinogen: 316 mg/dL (ref 210–475)

## 2020-05-11 LAB — PROCALCITONIN: Procalcitonin: <0.1 ng/mL

## 2020-05-11 LAB — MAGNESIUM: Magnesium: 2.2 mg/dL (ref 1.7–2.4)

## 2020-05-11 LAB — D-DIMER, QUANTITATIVE: D-Dimer, Quant: 1.84 ug/mL-FEU — ABNORMAL HIGH (ref 0.00–0.50)

## 2020-05-11 LAB — SODIUM, URINE, RANDOM: Sodium, Ur: <10 mmol/L

## 2020-05-11 LAB — LACTATE DEHYDROGENASE: LDH: 214 U/L — ABNORMAL HIGH (ref 98–192)

## 2020-05-11 LAB — LACTIC ACID, PLASMA: Lactic Acid, Venous: 1.2 mmol/L (ref 0.5–1.9)

## 2020-05-11 MED ORDER — SODIUM CHLORIDE 0.9 % IV SOLN: Freq: Once | INTRAVENOUS | Status: AC

## 2020-05-11 MED ORDER — ONDANSETRON HCL 4 MG/2ML IJ SOLN
4.0000 mg | Freq: Once | INTRAMUSCULAR | Status: AC
  Administered 2020-05-11: 4 mg via INTRAVENOUS
  Filled 2020-05-11: qty 2

## 2020-05-11 MED ORDER — SODIUM CHLORIDE 0.9 % IV BOLUS
500.0000 mL | Freq: Once | INTRAVENOUS | Status: AC
  Administered 2020-05-11: 500 mL via INTRAVENOUS

## 2020-05-11 NOTE — ED Notes (Signed)
Pt's wife died a yr ago and wants someone to take care of him.  He asked if we would feed him and take care of him.  He said if not he would like to go.

## 2020-05-11 NOTE — ED Notes (Signed)
Pt responded well to fluid challenge of 8oz of water.

## 2020-05-11 NOTE — Progress Notes (Signed)
PATIENT NAME: Jeffrey [name] DOB: 18-Apr-1929 MRN: [id number]  PRIMARY CARE PROVIDER: Tonia Ghent, MD  RESPONSIBLE PARTY:  Acct ID - Guarantor Home Phone Work Phone Relationship Acct Type  [id number] Jeffrey [name], [phone]  Self P/F     [address], [place], [place] [address]    PLAN OF CARE and INTERVENTIONS:               1.  GOALS OF CARE/ ADVANCE CARE PLANNING:  Patient desires to remain home and independent for as long as possible.               2.  PATIENT/CAREGIVER EDUCATION:  COVID virus, isolation, fluid intake               4. PERSONAL EMERGENCY PLAN:  Activate 911 for emergencies.               5.  DISEASE STATUS: Visit completed with patient.  He answers the door but gait is unsteady and he is staggering to the den.   Patient is COVID + per ED notes on 05/05/20.  Full PPE on for in-person visit.    He is uncertain if he has eaten.  The muffins that were delivered on Monday have been untouched.  Yogurt is untouched.  Frozen hamburgers unopened but there is a 2nd box that was open with 4 burgers present.   8 apple sauces present in fridge.  Patient shows me the mug he drinks water from.  There is an old straw present in the mug with mold in it.  This has been placed in the trash and a new one placed in the mug. I obtained an apple sauce and bottle water from the fridge and gave to patient.  He is able to eat and drink this without difficulty.  Blueberry yogurt given to patient and he is resistant to eat this as it is not the normal brand he purchases.  I did convince patient to eat the yogurt and he is able to tolerate this without issues intially.  Patient then reported some nausea.  He states he has  periods of nausea that limit his food intake.   Patient was able to drink 12 ounces of water, apple sauce and yogurt.  I have started patient drinking a 2nd bottle of water.     Patient states he believes his urine output has decreased.  Increase fluid intake is encouraged.  Patient  was able to void during this visit.  He is unable to recall the color of the urine.  Patient is displaying ongoing confusion.  He is repeating himself frequently.  Discussing his congestion and history of allergies.  He asked if he has the COVID virus and I have advised that he did test +.  Patient then states he does not believe he has this virus.  States he does not seem to be able to obtain any medical care.   Patient will say he did not eat anything for breakfast and then a few minutes later he states he did eat.  Patient removed his socks and shoes so his feet could be assessed.  Left ankle is purple is color.  Patient states he has not noticed this before.  He is uncertain if he has sustained a fall or twisted his ankle. He is moving the left foot without issues and denies any pain.  Right lateral foot with a bunion present.  No open wounds or dry skin is present.  Medications reviewed and patient is taking routine medications in addition to tylenol 3x daily.  He is currently out of protonix.  The is was last filled on 03/27/20.  I have called the [facility] but this medication could not be filled and pharmacist needs to speak with patient prior to refill.  Pharmacy is closed at this time for lunch.  I will call back again later.  Patient is reporting chest discomfort when coughing.  He does have a productive cough of white sputum.   O2 sats WNL and lung sounds are CTA.  No shortness of breath noted unless coughing.  405 pm.  I have attempted to reach [name] to provide an update but did not get an answer.  A HIPPA compliant message has been left on his voice mail requesting a call back.  Communication note sent to Randall An, RN for PCP.  I have requested prn zofran for patient.  Response pending.  502 pm.  Return call received from [name].  I have advised Tim of patient's condition with COVID dx, poor memory and safety concerns.  [name] is appreciative of the  update and states he has been trying to work with his father for the last 8 months.  Son states patient was involuntarily committed for about 2 weeks in the last year.  Since that time patient has displayed paranoia and made false accusations of the son.  Patient changed the locks to the home and son states he has not been to the home as his father will not allow him to participate in his care.  I have advised that APS has been contacted due to safety concerns.  [name] states he believes patient would benefit from being in and assisted living but does not think patient will be agreeable.  Tim also states he would like to help but at this point he is uncertain of how to do so. I advised patient did activate 911 on Wednesday night but was found to be stable with no apparent distress.  I advised him that patient will likely contact EMS again and he may hear from either EMS or the hospital should patient decide to go.  Tim voiced understanding and appreciation for the update.  554 pm  Phone call received from [place] with EMS.  She is present with patient and is inquiring about a baseline.  I have advised her of patient's history since admission to [facility] and my findings from today's visit.  EMS is also on the phone with [name] working on a plan to assist patient.  I have advised that I may be contacted should additional assistance be needed.     HISTORY OF PRESENT ILLNESS:  87 year old male with recent dx of COVID 59.  Patient also has dementia.  Patient is being followed by Palliative Care monthly and prn.  CODE STATUS: Full ADVANCED DIRECTIVES: Yes MOST FORM: No PPS: 50%   PHYSICAL EXAM:   VITALS:  Temp 98.1 F BP 122/78 P 72 R 24 O2 Sats 97% LUNGS: CTA + cough with white sputum CARDIAC: HRR EXTREMITIES: No edema SKIN: Warm and dry to touch. NEURO: Alert to self.  Significant short term memory deficits.       Lorenza Burton, RN

## 2020-05-11 NOTE — H&P (Signed)
History and Physical    Jeffrey [name] MLY:[id number] DOB: [birth year] DOA: 05/11/2020  PCP: Tonia Ghent, MD  Patient coming from: Home via EMS  I have personally briefly reviewed patient's old medical records in [place]  Chief Complaint: Dehydration  HPI: Jeffrey [name] is a 87 y.o. male with medical history significant for hyperlipidemia, TIA, memory loss and paranoia due to suspected dementia, who presents to the ED for evaluation of dehydration in the setting of recent COVID-19 infection.  Patient was initially seen in the ED 05/04/2020 for URI symptoms.  COVID-19 test was positive.  He was saturating well on room air.  Patient declined further evaluation with chest x-ray per ED documentation.  He was noted to have poor insight but otherwise alert, oriented, and mentating well therefore was discharged to home.  He has been followed with [facility] palliative care team who saw him earlier today. Patient reported nausea, decreased urine output to the palliative nurse.  He was noted to have ongoing confusion and repetitive speech.  He is complaining of congestion and allergies.  He was noted to be saturating well on room air.  After the visit, patient called EMS reportedly due to not managing well at home.  Patient states that he has had a change in his taste which has significantly diminishes oral intake over the last 2 days.  He says he has been having ongoing sinus drainage which caused him to cough up clear sputum.  He otherwise denies any subjective fevers, chills, diaphoresis, nausea, vomiting, abdominal pain, chest pain, dyspnea, diarrhea, or dysuria.  He is calm, awake, and alert.  He is oriented to self.  He knows he is in the hospital and that he is in [place] but not sure which hospital.  He is unable to tell me the year, instead answering with 60-61 or 70-71.  He says he has 2 lawyers who are stealing from him.  ED Course:  Initial vitals showed BP 142/59,  pulse 71, RR 18, temp 97.9 F, SPO2 99% on room air.  Labs notable for sodium 126, potassium 3.3, chloride 95, bicarb 20, BUN 16, creatinine 1.18, serum glucose 91, AST 41, ALT 26, alk phos 54, total bilirubin 0.8, WBC 5.2, hemoglobin 13.3, platelets 141,000, D-dimer 1.84, procalcitonin <0.10, CRP 0.6, ferritin 76.  Portable chest x-ray showed mild patchy airspace opacities in the mid to lower lung zones bilaterally.  Patient was given 500 cc normal saline followed by maintenance fluids.  The hospitalist service was consulted to admit for further evaluation and management.  Review of Systems: All systems reviewed and are negative except as documented in history of present illness above.   Past Medical History:  Diagnosis Date  . Adenomatous polyp of colon 1995    w/ HGD  . Allergic rhinitis   . BPH (benign prostatic hyperplasia)   . Diverticulosis 09/21/2000  . ED (erectile dysfunction)   . Hyperlipemia 05/05/1988  . Memory loss   . Schamberg's purpura    2012- eval by Dr. Allyson Sabal  . Sebaceous cyst   . Skin cancer 05/05/1998   hx of Scalp-melanoma  . TIA (transient ischemic attack)     Past Surgical History:  Procedure Laterality Date  . Carotid U/S Nml  09/08/2007  . Colon polyp  2011   B9 Int Hemms  . Colonoscopy polyps  10/05/2003   Divertics in hemms  . COLONOSCOPY W/ POLYPECTOMY  08/1993; 09/21/2000;10/05/2003   B9  . Echo (other)  09/08/2007  nml lvf EF 50-55% Mild Diast Dysfctn Triv Ar  . melanoma scalp  07/2003  . SEPTOPLASTY  1995  . Skin revision vertex of scalp  2009   Dr. Luetta Nutting, [place]      Social History:  reports that he quit smoking about 62 years ago. His smoking use included cigarettes. He quit after 5.00 years of use. He has never used smokeless tobacco. He reports that he does not drink alcohol and does not use drugs.  Allergies  Allergen Reactions  . Flonase [Fluticasone Propionate] Other (See Comments)    Nosebleeds.    . Tamsulosin      REACTION: flushed and arms felt stiff    Family History  Problem Relation Age of Onset  . Rectal cancer Father   . Colon cancer Paternal Aunt   . Other Paternal Aunt        cancer on back of neck  . Prostate cancer Neg Hx      Prior to Admission medications   Medication Sig Start Date End Date Taking? Authorizing Provider  aspirin (BAYER ASPIRIN EC LOW DOSE) 81 MG EC tablet Take 81 mg by mouth daily.      [provider]  Coenzyme Q10 (COQ-10) 100 MG CAPS Take 200 mg by mouth 3 (three) times daily.     [provider]  donepezil (ARICEPT) 10 MG tablet TAKE 1 TABLET BY MOUTH AT BEDTIME 02/13/20   Suzzanne Cloud, NP  lactase (LACTAID) 3000 units tablet Take by mouth daily.    [provider]  loratadine (CLARITIN) 10 MG tablet Take 10 mg by mouth daily.     [provider]  Multiple Vitamins-Minerals (MULTIVITAL) tablet Take 1 tablet by mouth daily.      [provider]  pantoprazole (PROTONIX) 40 MG tablet TAKE 1 TABLET BY MOUTH IN THE MORNING 05/31/19   Ladene Artist, MD  polyethylene glycol powder (MIRALAX) 17 GM/SCOOP powder Take 17 g by mouth daily. 05/11/19   Ria Bush, MD  pravastatin (PRAVACHOL) 40 MG tablet Take 1 tablet by mouth once daily 03/27/20   Tonia Ghent, MD  simethicone (MYLICON) 034 MG chewable tablet Chew 125 mg by mouth in the morning, at noon, and at bedtime.     [provider]  triamcinolone (NASACORT) 55 MCG/ACT AERO nasal inhaler Place 2 sprays into the nose every other day. 03/07/20   Abner Greenspan, MD    Physical Exam: Vitals:   05/11/20 2015 05/11/20 2016 05/11/20 2100 05/11/20 2201  BP:   (!) 141/73 139/65  Pulse:   70 72  Resp:   (!) 23 20  Temp:      TempSrc:      SpO2: 98%  98% 99%  Weight:  65.8 kg    Height:  _0  (1.702 m)     Constitutional: Resting supine in bed, NAD, calm, comfortable Eyes: PERRL, lids and conjunctivae normal ENMT: Mucous membranes are dry. Posterior  pharynx clear of any exudate or lesions.Normal dentition.  Neck: normal, supple, no masses. Respiratory: clear to auscultation bilaterally, no wheezing, no crackles. Normal respiratory effort. No accessory muscle use.  Cardiovascular: Regular rate and rhythm, no murmurs / rubs / gallops. No extremity edema. 2+ pedal pulses. Abdomen: no tenderness, no masses palpated. No hepatosplenomegaly. Bowel sounds positive.  Musculoskeletal: no clubbing / cyanosis. No joint deformity upper and lower extremities. Good ROM, no contractures. Normal muscle tone.  Skin: no rashes, lesions, ulcers. No induration Neurologic:  CN 2-12 grossly intact. Sensation intact, Strength 5/5 in all 4.  Psychiatric: Awake, alert, and oriented to self.  Knows he is in [place] and in the hospital but not sure which hospital.  Does not know the year.  Has tangential speech with question of paranoid thoughts.  Does not appear to be actively hallucinating.  Labs on Admission: I have personally reviewed following labs and imaging studies  CBC: Recent Labs  Lab 05/11/20 2044  WBC 5.2  NEUTROABS 3.5  HGB 13.3  HCT 36.9*  MCV 88.9  PLT 417*   Basic Metabolic Panel: Recent Labs  Lab 05/11/20 2044  NA 126*  K 3.3*  CL 95*  CO2 20*  GLUCOSE 91  BUN 16  CREATININE 1.18  CALCIUM 8.3*   GFR: Estimated Creatinine Clearance: 39.5 mL/min (by C-G formula based on SCr of 1.18 mg/dL). Liver Function Tests: Recent Labs  Lab 05/11/20 2044  AST 41  ALT 26  ALKPHOS 54  BILITOT 0.8  PROT 6.1*  ALBUMIN 3.6   No results for input(s): LIPASE, AMYLASE in the last 168 hours. No results for input(s): AMMONIA in the last 168 hours. Coagulation Profile: No results for input(s): INR, PROTIME in the last 168 hours. Cardiac Enzymes: No results for input(s): CKTOTAL, CKMB, CKMBINDEX, TROPONINI in the last 168 hours. BNP (last 3 results) No results for input(s): PROBNP in the last 8760 hours. HbA1C: No results for input(s):  HGBA1C in the last 72 hours. CBG: No results for input(s): GLUCAP in the last 168 hours. Lipid Profile: Recent Labs    05/11/20 2044  TRIG 77   Thyroid Function Tests: No results for input(s): TSH, T4TOTAL, FREET4, T3FREE, THYROIDAB in the last 72 hours. Anemia Panel: Recent Labs    05/11/20 2044  FERRITIN 76   Urine analysis:    Component Value Date/Time   COLORURINE STRAW (A) 06/10/2019 1039   APPEARANCEUR CLEAR 06/10/2019 1039   LABSPEC 1.006 06/10/2019 1039   PHURINE 7.0 06/10/2019 1039   GLUCOSEU NEGATIVE 06/10/2019 1039   HGBUR NEGATIVE 06/10/2019 1039   BILIRUBINUR NEGATIVE 06/10/2019 1039   BILIRUBINUR Neg 05/11/2019 1720   KETONESUR NEGATIVE 06/10/2019 1039   PROTEINUR NEGATIVE 06/10/2019 1039   UROBILINOGEN 0.2 05/11/2019 1720   NITRITE NEGATIVE 06/10/2019 1039   LEUKOCYTESUR NEGATIVE 06/10/2019 1039    Radiological Exams on Admission: DG Chest Port 1 View  Result Date: 05/11/2020 CLINICAL DATA:  COVID positive. EXAM: PORTABLE CHEST 1 VIEW COMPARISON:  03/12/2020 FINDINGS: Lung volumes are low. Patchy heterogeneous bilateral airspace opacities in a mid-lower lung zone predominant distribution. The heart is normal in size with normal mediastinal contours. Small retrocardiac hiatal hernia. No pneumothorax or large pleural effusion. Chronic right AC joint separation. No acute osseous abnormalities are seen. IMPRESSION: Patchy heterogeneous bilateral airspace opacities in a mid-lower lung zone predominant distribution, pattern consistent with COVID-19 pneumonia. Electronically Signed   By: Keith Rake M.D.   On: 05/11/2020 20:23    EKG: Personally reviewed. Sinus rhythm without acute ischemic changes, QTC 487.  QTC more prolonged otherwise not significantly changed when compared to prior.  Assessment/Plan Principal Problem:   Hyponatremia Active Problems:   HLD (hyperlipidemia)   Memory change   Hypokalemia  Jeffrey [name] is a 87 y.o. male with medical  history significant for hyperlipidemia, TIA, memory loss and paranoia due to suspected dementia, who is admitted with hyponatremia.  Hyponatremia/hypokalemia/dehydration: Secondary to poor oral intake in the setting of COVID-19 infection.  Supplement potassium, continue gentle IV fluid  hydration, and recheck labs in a.m.  Dementia/memory impairment with failure to thrive: Appears to be an ongoing issue for several years based on chart review.  He currently lives alone and has declined placement in ALF in the past.  Has some confusion although unclear if any significant change from baseline.  Also displaying some paranoia which seems to be ongoing.  He required involuntary inpatient psych admission in February 2021.  Followed by [facility] palliative as an outpatient. -Continue Aricept  COVID-19 viral infection: SARS-CoV-2 PCR + 05/04/2020.  CXR with question of mild infiltrate.  Patient has mild URI symptoms and change in taste resulting in poor oral intake but otherwise stable from a respiratory standpoint.  He is saturating 100% on room air at time of admission.  Denies any shortness of breath or GI symptoms.  Continue to monitor, no indication for remdesivir or steroids at this time.  Generalized weakness: In setting of dehydration.  Request PT/OT eval.  Hyperlipidemia: Continue pravastatin.   DVT prophylaxis: Lovenox Code Status: Full code, confirmed with patient Family Communication: Discussed with patient Disposition Plan: From home, dispo pending PT/OT eval. Consults called: None Admission status:  Status is: Inpatient  Remains inpatient appropriate because:Unsafe d/c plan   Dispo: The patient is from: Home              Anticipated d/c is to: Home versus SNF or ALF              Anticipated d/c date is: 2 days              Patient currently is not medically stable to d/c.   Zada Finders MD [facility]  If 7PM-7AM, please contact  night-coverage [email or web address]  05/11/2020, 10:53 PM

## 2020-05-11 NOTE — ED Provider Notes (Signed)
[facility] DEPT Provider Note   CSN: [id number] Arrival date & time: 05/11/20  1902     History Chief Complaint  Patient presents with  . Covid Positive    Jeffrey [name] is a 87 y.o. male.  HPI 87 year old male presents via EMS with not feeling well.  History is obtained via patient (which is limited due to some memory issues), son over the phone, and chart review.  The patient was diagnosed with COVID-19 on 1/1.  He states he has been having congestion and nausea which has limited his ability to eat and interrupted his typical daily routine.  He denies fevers or trouble breathing.  A nurse came out to see him today and her note indicates that he has not been eating much and may be a little confused.  The confusion the son describes seems consistent with his progressive memory issues for a year and a half.  The patient states that if he could eat he would be better.   Past Medical History:  Diagnosis Date  . Adenomatous polyp of colon 1995    w/ HGD  . Allergic rhinitis   . BPH (benign prostatic hyperplasia)   . Diverticulosis 09/21/2000  . ED (erectile dysfunction)   . Hyperlipemia 05/05/1988  . Memory loss   . Schamberg's purpura    2012- eval by Dr. Allyson Sabal  . Sebaceous cyst   . Skin cancer 05/05/1998   hx of Scalp-melanoma  . TIA (transient ischemic attack)     Patient Active Problem List   Diagnosis Date Noted  . Hyponatremia 05/11/2020  . Hypokalemia 05/11/2020  . Dizziness 03/07/2020  . Bilateral cold feet 03/07/2020  . Infected sebaceous cyst 01/25/2020  . Rectal bleeding 09/26/2019  . Chronic constipation 09/19/2019  . Nasal sinus congestion 03/08/2019  . Lung nodule 02-04-2019  . Death of wife 2019-02-04  . Esophageal stenosis 01/09/2017  . Shoulder pain, right 08/24/2016  . Memory change 01/03/2016  . Left ankle pain 11/05/2015  . Advance care planning 12/29/2013  . Allergic rhinitis 12/23/2012  . Medicare annual wellness  visit, subsequent 12/22/2011  . History of melanoma 09/22/2011  . Knee pain 08/13/2011  . Paresthesia of foot 08/13/2011  . ETD (eustachian tube dysfunction) 05/01/2011  . ED (erectile dysfunction) 11/14/2010  . Schamberg's purpura 08/01/2010  . EXTERNAL HEMORRHOIDS 05/02/2010  . HYPERTROPHY PROSTATE W/UR OBST & OTH LUTS 10/09/2009  . ELEVATED BLOOD PRESSURE WITHOUT DIAGNOSIS OF HYPERTENSION 10/03/2008  . TRANSIENT ISCHEMIC ATTACK 08/24/2007  . COLONIC POLYPS, HX OF 08/24/2006  . DIVERTICULOSIS, COLON 09/21/2000  . HLD (hyperlipidemia) 05/05/1988    Past Surgical History:  Procedure Laterality Date  . Carotid U/S Nml  09/08/2007  . Colon polyp  2011   B9 Int Hemms  . Colonoscopy polyps  10/05/2003   Divertics in hemms  . COLONOSCOPY W/ POLYPECTOMY  08/1993; 09/21/2000;10/05/2003   B9  . Echo (other)  09/08/2007   nml lvf EF 50-55% Mild Diast Dysfctn Triv Ar  . melanoma scalp  07/2003  . SEPTOPLASTY  1995  . Skin revision vertex of scalp  2009   Dr. Luetta Nutting, [facility]  . TONSILLECTOMY         Family History  Problem Relation Age of Onset  . Rectal cancer Father   . Colon cancer Paternal Aunt   . Other Paternal Aunt        cancer on back of neck  . Prostate cancer Neg Hx     Social History  Tobacco Use  . Smoking status: Former Smoker    Years: 5.00    Types: Cigarettes    Quit date: 05/05/1958    Years since quitting: 62.0  . Smokeless tobacco: Never Used  Vaping Use  . Vaping Use: Never used  Substance Use Topics  . Alcohol use: No  . Drug use: No    Home Medications Prior to Admission medications   Medication Sig Start Date End Date Taking? Authorizing Provider  aspirin (BAYER ASPIRIN EC LOW DOSE) 81 MG EC tablet Take 81 mg by mouth daily.      [provider]  Coenzyme Q10 (COQ-10) 100 MG CAPS Take 200 mg by mouth 3 (three) times daily.     [provider]  donepezil (ARICEPT) 10 MG tablet TAKE 1 TABLET BY MOUTH AT BEDTIME 02/13/20   Suzzanne Cloud,  NP  lactase (LACTAID) 3000 units tablet Take by mouth daily.    [provider]  loratadine (CLARITIN) 10 MG tablet Take 10 mg by mouth daily.     [provider]  Multiple Vitamins-Minerals (MULTIVITAL) tablet Take 1 tablet by mouth daily.      [provider]  pantoprazole (PROTONIX) 40 MG tablet TAKE 1 TABLET BY MOUTH IN THE MORNING 05/31/19   Ladene Artist, MD  polyethylene glycol powder (MIRALAX) 17 GM/SCOOP powder Take 17 g by mouth daily. 05/11/19   Ria Bush, MD  pravastatin (PRAVACHOL) 40 MG tablet Take 1 tablet by mouth once daily 03/27/20   Tonia Ghent, MD  simethicone (MYLICON) 0000000 MG chewable tablet Chew 125 mg by mouth in the morning, at noon, and at bedtime.     [provider]  triamcinolone (NASACORT) 55 MCG/ACT AERO nasal inhaler Place 2 sprays into the nose every other day. 03/07/20   Tower, Wynelle Fanny, MD    Allergies    Flonase [fluticasone propionate] and Tamsulosin  Review of Systems   Review of Systems  Unable to perform ROS: Dementia    Physical Exam Updated Vital Signs BP 139/65   Pulse 72   Temp 97.9 F (36.6 C) (Oral)   Resp 20   Ht 5\' 7"  (1.702 m)   Wt 65.8 kg   SpO2 99%   BMI 22.71 kg/m   Physical Exam Vitals and nursing note reviewed.  Constitutional:      Appearance: He is well-developed and well-nourished.  HENT:     Head: Normocephalic and atraumatic.     Right Ear: External ear normal.     Left Ear: External ear normal.     Nose: Nose normal.  Eyes:     General:        Right eye: No discharge.        Left eye: No discharge.  Cardiovascular:     Rate and Rhythm: Normal rate and regular rhythm.     Heart sounds: Normal heart sounds.  Pulmonary:     Effort: Pulmonary effort is normal.     Breath sounds: Normal breath sounds.  Abdominal:     General: There is no distension.     Palpations: Abdomen is soft.     Tenderness: There is no abdominal tenderness.  Musculoskeletal:         General: No edema.     Cervical back: Neck supple.  Skin:    General: Skin is warm and dry.  Neurological:     Mental Status: He is alert.     Comments: Awake, alert, oriented to person  and place. Knows day of week but disoriented to year (but knows the new year just occurred). Cannot name president but knows it's the one after Trump 5/5 strength in all 4 extremities. Clear speech  Psychiatric:        Mood and Affect: Mood is not anxious.     ED Results / Procedures / Treatments   Labs (all labs ordered are listed, but only abnormal results are displayed) Labs Reviewed  CBC WITH DIFFERENTIAL/PLATELET - Abnormal; Notable for the following components:      Result Value   RBC 4.15 (*)    HCT 36.9 (*)    Platelets 141 (*)    All other components within normal limits  COMPREHENSIVE METABOLIC PANEL - Abnormal; Notable for the following components:   Sodium 126 (*)    Potassium 3.3 (*)    Chloride 95 (*)    CO2 20 (*)    Calcium 8.3 (*)    Total Protein 6.1 (*)    GFR, Estimated 59 (*)    All other components within normal limits  D-DIMER, QUANTITATIVE (NOT AT [facility]) - Abnormal; Notable for the following components:   D-Dimer, Quant 1.84 (*)    All other components within normal limits  LACTATE DEHYDROGENASE - Abnormal; Notable for the following components:   LDH 214 (*)    All other components within normal limits  CULTURE, BLOOD (ROUTINE X 2)  CULTURE, BLOOD (ROUTINE X 2)  LACTIC ACID, PLASMA  PROCALCITONIN  FERRITIN  TRIGLYCERIDES  FIBRINOGEN  C-REACTIVE PROTEIN  MAGNESIUM  OSMOLALITY, URINE  OSMOLALITY  URINALYSIS, ROUTINE W REFLEX MICROSCOPIC  SODIUM, URINE, RANDOM    EKG EKG Interpretation  Date/Time:  Friday May 11 2020 20:16:59 EST Ventricular Rate:  70 PR Interval:    QRS Duration: 95 QT Interval:  451 QTC Calculation: 487 R Axis:   38 Text Interpretation: Sinus rhythm Borderline prolonged QT interval similar to Nov 2021 Confirmed by Sherwood Gambler  [phone]) on 05/11/2020 8:31:05 PM   Radiology DG Chest Port 1 View  Result Date: 05/11/2020 CLINICAL DATA:  COVID positive. EXAM: PORTABLE CHEST 1 VIEW COMPARISON:  03/12/2020 FINDINGS: Lung volumes are low. Patchy heterogeneous bilateral airspace opacities in a mid-lower lung zone predominant distribution. The heart is normal in size with normal mediastinal contours. Small retrocardiac hiatal hernia. No pneumothorax or large pleural effusion. Chronic right AC joint separation. No acute osseous abnormalities are seen. IMPRESSION: Patchy heterogeneous bilateral airspace opacities in a mid-lower lung zone predominant distribution, pattern consistent with COVID-19 pneumonia. Electronically Signed   By: Keith Rake M.D.   On: 05/11/2020 20:23    Procedures Procedures (including critical care time)  Medications Ordered in ED Medications  0.9 %  sodium chloride infusion (has no administration in time range)  sodium chloride 0.9 % bolus 500 mL (500 mLs Intravenous New Bag/Given 05/11/20 2057)  ondansetron (ZOFRAN) injection 4 mg (4 mg Intravenous Given 05/11/20 2055)    ED Course  I have reviewed the triage vital signs and the nursing notes.  Pertinent labs & imaging results that were available during my care of the patient were reviewed by me and considered in my medical decision making (see chart for details).    MDM Rules/Calculators/A&P                          Patient seems to be having essentially failure to thrive due to his COVID-19 infection.  It is difficult because the patient clearly  has some memory issues but at the same time is aware enough of what is going on.  He is willing to stay in the hospital and I think he should given his decreased p.o. intake along with significant hyponatremia and hypokalemia.  From a Covid standpoint besides this causing him to have decreased p.o. intake he is not severely ill.  He is not hypoxic.  Discussed with Dr. Posey Pronto for admission.  Jeffrey [name] was evaluated in Emergency Department on 05/11/2020 for the symptoms described in the history of present illness. He was evaluated in the context of the global COVID-19 pandemic, which necessitated consideration that the patient might be at risk for infection with the SARS-CoV-2 virus that causes COVID-19. Institutional protocols and algorithms that pertain to the evaluation of patients at risk for COVID-19 are in a state of rapid change based on information released by regulatory bodies including the CDC and federal and state organizations. These policies and algorithms were followed during the patient's care in the ED.  Final Clinical Impression(s) / ED Diagnoses Final diagnoses:  COVID-19 virus infection  Hyponatremia    Rx / DC Orders ED Discharge Orders    None       Sherwood Gambler, MD 05/11/20 2300

## 2020-05-11 NOTE — ED Triage Notes (Addendum)
87 yo male with dementia, called EMS on himself because he said he was not managing well at home, unable to provide reliable history but feels that "something is wrong". Diagnosed with COVID on the 1st, has been exhibiting signs of paranoia, EMS report indicates that he is not being well taken care of.

## 2020-05-12 DIAGNOSIS — E871 Hypo-osmolality and hyponatremia: Secondary | ICD-10-CM | POA: Diagnosis not present

## 2020-05-12 LAB — RENAL FUNCTION PANEL
Albumin: 3.2 g/dL — ABNORMAL LOW (ref 3.5–5.0)
Anion gap: 10 (ref 5–15)
BUN: 12 mg/dL (ref 8–23)
CO2: 17 mmol/L — ABNORMAL LOW (ref 22–32)
Calcium: 7.6 mg/dL — ABNORMAL LOW (ref 8.9–10.3)
Chloride: 102 mmol/L (ref 98–111)
Creatinine, Ser: 0.98 mg/dL (ref 0.61–1.24)
GFR, Estimated: >60 mL/min (ref >60)
Glucose, Bld: 94 mg/dL (ref 70–99)
Phosphorus: 3 mg/dL (ref 2.5–4.6)
Potassium: 4.1 mmol/L (ref 3.5–5.1)
Sodium: 129 mmol/L — ABNORMAL LOW (ref 135–145)

## 2020-05-12 LAB — MAGNESIUM: Magnesium: 2.2 mg/dL (ref 1.7–2.4)

## 2020-05-12 LAB — OSMOLALITY: Osmolality: 264 mOsm/kg — ABNORMAL LOW (ref 275–295)

## 2020-05-12 LAB — OSMOLALITY, URINE: Osmolality, Ur: 208 mOsm/kg — ABNORMAL LOW (ref 300–900)

## 2020-05-12 MED ORDER — ONDANSETRON HCL 4 MG/2ML IJ SOLN
4.0000 mg | Freq: Four times a day (QID) | INTRAMUSCULAR | Status: DC | PRN
  Administered 2020-05-13: 4 mg via INTRAVENOUS
  Filled 2020-05-12: qty 2

## 2020-05-12 MED ORDER — ASCORBIC ACID 500 MG PO TABS
500.0000 mg | ORAL_TABLET | Freq: Every day | ORAL | Status: DC
  Administered 2020-05-12 – 2020-05-15 (×4): 500 mg via ORAL
  Filled 2020-05-12 (×4): qty 1

## 2020-05-12 MED ORDER — ZINC SULFATE 220 (50 ZN) MG PO CAPS
220.0000 mg | ORAL_CAPSULE | Freq: Every day | ORAL | Status: DC
  Administered 2020-05-12 – 2020-05-15 (×4): 220 mg via ORAL
  Filled 2020-05-12 (×4): qty 1

## 2020-05-12 MED ORDER — ENOXAPARIN SODIUM 40 MG/0.4ML ~~LOC~~ SOLN
40.0000 mg | SUBCUTANEOUS | Status: DC
  Administered 2020-05-12 – 2020-05-14 (×3): 40 mg via SUBCUTANEOUS
  Filled 2020-05-12 (×4): qty 0.4

## 2020-05-12 MED ORDER — ALBUTEROL SULFATE HFA 108 (90 BASE) MCG/ACT IN AERS: 1.0000 | INHALATION_SPRAY | Freq: Four times a day (QID) | RESPIRATORY_TRACT | Status: DC | PRN

## 2020-05-12 MED ORDER — DONEPEZIL HCL 10 MG PO TABS
10.0000 mg | ORAL_TABLET | Freq: Every day | ORAL | Status: DC
  Administered 2020-05-12 – 2020-05-14 (×4): 10 mg via ORAL
  Filled 2020-05-12 (×2): qty 1
  Filled 2020-05-12: qty 2
  Filled 2020-05-12: qty 1

## 2020-05-12 MED ORDER — ACETAMINOPHEN 325 MG PO TABS
650.0000 mg | ORAL_TABLET | Freq: Four times a day (QID) | ORAL | Status: DC | PRN
  Administered 2020-05-14: 650 mg via ORAL
  Filled 2020-05-12: qty 2

## 2020-05-12 MED ORDER — POTASSIUM CHLORIDE 20 MEQ PO PACK
60.0000 meq | PACK | Freq: Once | ORAL | Status: AC
  Administered 2020-05-12: 60 meq via ORAL
  Filled 2020-05-12: qty 3

## 2020-05-12 MED ORDER — SODIUM CHLORIDE 0.9 % IV SOLN: INTRAVENOUS | Status: AC

## 2020-05-12 MED ORDER — GUAIFENESIN-DM 100-10 MG/5ML PO SYRP
10.0000 mL | ORAL_SOLUTION | ORAL | Status: DC | PRN
  Filled 2020-05-12: qty 10

## 2020-05-12 MED ORDER — LORATADINE 10 MG PO TABS
10.0000 mg | ORAL_TABLET | Freq: Every day | ORAL | Status: DC
  Administered 2020-05-12 – 2020-05-15 (×4): 10 mg via ORAL
  Filled 2020-05-12 (×4): qty 1

## 2020-05-12 MED ORDER — PRAVASTATIN SODIUM 20 MG PO TABS
40.0000 mg | ORAL_TABLET | Freq: Every day | ORAL | Status: DC
  Administered 2020-05-12 – 2020-05-15 (×4): 40 mg via ORAL
  Filled 2020-05-12 (×4): qty 2

## 2020-05-12 MED ORDER — SODIUM CHLORIDE 0.9 % IV SOLN: INTRAVENOUS | Status: DC

## 2020-05-12 MED ORDER — ONDANSETRON HCL 4 MG PO TABS: 4.0000 mg | ORAL_TABLET | Freq: Four times a day (QID) | ORAL | Status: DC | PRN

## 2020-05-12 MED ORDER — ASPIRIN EC 81 MG PO TBEC
81.0000 mg | DELAYED_RELEASE_TABLET | Freq: Every day | ORAL | Status: DC
  Administered 2020-05-12 – 2020-05-15 (×4): 81 mg via ORAL
  Filled 2020-05-12 (×4): qty 1

## 2020-05-12 NOTE — Progress Notes (Signed)
PROGRESS NOTE    Jeffrey [name]  BZJ:[id number] DOB: 06-02-1928 DOA: 05/11/2020 PCP: Tonia Ghent, MD  Outpatient Specialists:    Brief Narrative:  As per HPI done on admission: "Jeffrey [name] is a 87 y.o. male with medical history significant for hyperlipidemia, TIA, memory loss and paranoia due to suspected dementia, who presents to the ED for evaluation of dehydration in the setting of recent COVID-19 infection.  Patient was initially seen in the ED 05/04/2020 for URI symptoms.  COVID-19 test was positive.  He was saturating well on room air.  Patient declined further evaluation with chest x-ray per ED documentation.  He was noted to have poor insight but otherwise alert, oriented, and mentating well therefore was discharged to home.  He has been followed with [facility] palliative care team who saw him earlier today. Patient reported nausea, decreased urine output to the palliative nurse.  He was noted to have ongoing confusion and repetitive speech.  He is complaining of congestion and allergies.  He was noted to be saturating well on room air.  After the visit, patient called EMS reportedly due to not managing well at home.  Patient states that he has had a change in his taste which has significantly diminishes oral intake over the last 2 days.  He says he has been having ongoing sinus drainage which caused him to cough up clear sputum.  He otherwise denies any subjective fevers, chills, diaphoresis, nausea, vomiting, abdominal pain, chest pain, dyspnea, diarrhea, or dysuria.  He is calm, awake, and alert.  He is oriented to self.  He knows he is in the hospital and that he is in [place] but not sure which hospital.  He is unable to tell me the year, instead answering with 60-61 or 70-71.  He says he has 2 lawyers who are stealing from him.  ED Course:  Initial vitals showed BP 142/59, pulse 71, RR 18, temp 97.9 F, SPO2 99% on room air.  Labs notable for sodium 126,  potassium 3.3, chloride 95, bicarb 20, BUN 16, creatinine 1.18, serum glucose 91, AST 41, ALT 26, alk phos 54, total bilirubin 0.8, WBC 5.2, hemoglobin 13.3, platelets 141,000, D-dimer 1.84, procalcitonin <0.10, CRP 0.6, ferritin 76.  Portable chest x-ray showed mild patchy airspace opacities in the mid to lower lung zones bilaterally.  Patient was given 500 cc normal saline followed by maintenance fluids.  The hospitalist service was consulted to admit for further evaluation and management".  05/12/2020: Patient seen.  Discussed with the patient's nurse extensively.  We will continue volume resuscitation.  We will monitor correct abnormal electrolytes.  Diarrhea stools noted.  Patient is on room air.  Patient is likely failing to thrive at home.  We will pursue PT OT consult, as well as, social work input.  Patient may need placement.  Assessment & Plan:   Principal Problem:   Hyponatremia Active Problems:   HLD (hyperlipidemia)   Memory change   Hypokalemia  Hyponatremia/hypokalemia/dehydration: -Patient has had poor p.o. intake.  Patient is also having diarrhea. -Suspect secondary to volume depletion/diarrhea.   -Continue IV fluids. -Monitor and replete electrolyte abnormalities.   -Monitor renal function closely and volume status.  Dementia/memory impairment with failure to thrive: -PT OT consult. -Social worker consult. Patient may need placement. Continue Aricept.    COVID-19 viral infection: -SARS-CoV-2 PCR + 05/04/2020.   -CXR with question of mild infiltrate.   -Patient has mild URI symptoms and change in taste resulting in poor oral  intake but otherwise stable from a respiratory standpoint.   -Saturated 100% on room air  -Denies any shortness of breath or GI symptoms.   -Continue to monitor, no indication for remdesivir or steroids at this time.  Generalized weakness: -Correct electrolytes abnormalities -Continue rehydration. -PT OT consult. -Possible  placement/rehab.  Hyperlipidemia: Continue pravastatin.   DVT prophylaxis: Lovenox Code Status: Full code Family Communication:  Disposition Plan: This will depend on hospital course.  Likely, patient may need to be placed   Consultants:   None  Procedures:   None  Antimicrobials:   None   Subjective: Patient is a poor historian. No shortness of breath.  Objective: Vitals:   05/12/20 0430 05/12/20 0552 05/12/20 0933 05/12/20 1351  BP: (!) 118/59 (!) 134/57 (!) 130/52 (!) 119/46  Pulse: 73 76 67 77  Resp: _0 Temp:  97.8 F (36.6 C) 97.7 F (36.5 C) 97.6 F (36.4 C)  TempSrc:  Oral    SpO2: 96% 98% 99% 98%  Weight:      Height:        Intake/Output Summary (Last 24 hours) at 05/12/2020 1511 Last data filed at 05/12/2020 0900 Gross per 24 hour  Intake 315 ml  Output --  Net 315 ml   Filed Weights   05/11/20 2016  Weight: 65.8 kg    Examination:  General exam: Appears calm and comfortable.  Patient is not in any distress. Respiratory system: Clear to auscultation.  Cardiovascular system: S1 & S2 Gastrointestinal system: Abdomen is nondistended, soft and nontender. No organomegaly or masses felt. Normal bowel sounds heard. Central nervous system: Awake and alert.  Patient moves all extremities.   Extremities: No leg edema  Data Reviewed: I have personally reviewed following labs and imaging studies  CBC: Recent Labs  Lab 05/11/20 2044  WBC 5.2  NEUTROABS 3.5  HGB 13.3  HCT 36.9*  MCV 88.9  PLT 828*   Basic Metabolic Panel: Recent Labs  Lab 05/11/20 2044 05/12/20 1408  NA 126* 129*  K 3.3* 4.1  CL 95* 102  CO2 20* 17*  GLUCOSE 91 94  BUN 16 12  CREATININE 1.18 0.98  CALCIUM 8.3* 7.6*  MG 2.2 2.2  PHOS  --  3.0   GFR: Estimated Creatinine Clearance: 47.6 mL/min (by C-G formula based on SCr of 0.98 mg/dL). Liver Function Tests: Recent Labs  Lab 05/11/20 2044 05/12/20 1408  AST 41  --   ALT 26  --   ALKPHOS 54  --    BILITOT 0.8  --   PROT 6.1*  --   ALBUMIN 3.6 3.2*   No results for input(s): LIPASE, AMYLASE in the last 168 hours. No results for input(s): AMMONIA in the last 168 hours. Coagulation Profile: No results for input(s): INR, PROTIME in the last 168 hours. Cardiac Enzymes: No results for input(s): CKTOTAL, CKMB, CKMBINDEX, TROPONINI in the last 168 hours. BNP (last 3 results) No results for input(s): PROBNP in the last 8760 hours. HbA1C: No results for input(s): HGBA1C in the last 72 hours. CBG: No results for input(s): GLUCAP in the last 168 hours. Lipid Profile: Recent Labs    05/11/20 2044  TRIG 77   Thyroid Function Tests: No results for input(s): TSH, T4TOTAL, FREET4, T3FREE, THYROIDAB in the last 72 hours. Anemia Panel: Recent Labs    05/11/20 2044  FERRITIN 76   Urine analysis:    Component Value Date/Time   COLORURINE YELLOW 05/11/2020 2252   APPEARANCEUR CLEAR 05/11/2020  2252   LABSPEC 1.008 05/11/2020 2252   PHURINE 6.0 05/11/2020 2252   GLUCOSEU NEGATIVE 05/11/2020 2252   HGBUR NEGATIVE 05/11/2020 2252   BILIRUBINUR NEGATIVE 05/11/2020 2252   BILIRUBINUR Neg 05/11/2019 1720   KETONESUR 5 (A) 05/11/2020 2252   PROTEINUR NEGATIVE 05/11/2020 2252   UROBILINOGEN 0.2 05/11/2019 1720   NITRITE NEGATIVE 05/11/2020 2252   LEUKOCYTESUR NEGATIVE 05/11/2020 2252   Sepsis Labs: _0 (procalcitonin:4,lacticidven:4)  ) Recent Results (from the past 240 hour(s))  Resp Panel by RT-PCR (Flu A&B, Covid) Nasopharyngeal Swab     Status: Abnormal   Collection Time: 05/04/20 10:24 PM   Specimen: Nasopharyngeal Swab; Nasopharyngeal(NP) swabs in vial transport medium  Result Value Ref Range Status   SARS Coronavirus 2 by RT PCR POSITIVE (A) NEGATIVE Final    Comment: CRITICAL RESULT CALLED TO, READ BACK BY AND VERIFIED WITH: HOLLY L. RN 01/01/20211 _1  BY P.HNDERSON (NOTE) SARS-CoV-2 target nucleic acids are DETECTED.  The SARS-CoV-2 RNA is generally detectable  in upper respiratory specimens during the acute phase of infection. Positive results are indicative of the presence of the identified virus, but do not rule out bacterial infection or co-infection with other pathogens not detected by the test. Clinical correlation with patient history and other diagnostic information is necessary to determine patient infection status. The expected result is Negative.  Fact Sheet for Patients: EntrepreneurPulse.com.au  Fact Sheet for Healthcare Providers: IncredibleEmployment.be  This test is not yet approved or cleared by the [place] FDA and  has been authorized for detection and/or diagnosis of SARS-CoV-2 by FDA under an Emergency Use Authorization (EUA).  This EUA will remain in effect (meani ng this test can be used) for the duration of  the COVID-19 declaration under Section 564(b)(1) of the Act, 21 U.S.C. section 360bbb-3(b)(1), unless the authorization is terminated or revoked sooner.     Influenza A by PCR NEGATIVE NEGATIVE Final   Influenza B by PCR NEGATIVE NEGATIVE Final    Comment: (NOTE) The Xpert Xpress SARS-CoV-2/FLU/RSV plus assay is intended as an aid in the diagnosis of influenza from Nasopharyngeal swab specimens and should not be used as a sole basis for treatment. Nasal washings and aspirates are unacceptable for Xpert Xpress SARS-CoV-2/FLU/RSV testing.  Fact Sheet for Patients: EntrepreneurPulse.com.au  Fact Sheet for Healthcare Providers: IncredibleEmployment.be  This test is not yet approved or cleared by the [place] FDA and has been authorized for detection and/or diagnosis of SARS-CoV-2 by FDA under an Emergency Use Authorization (EUA). This EUA will remain in effect (meaning this test can be used) for the duration of the COVID-19 declaration under Section 564(b)(1) of the Act, 21 U.S.C. section 360bbb-3(b)(1), unless the authorization  is terminated or revoked.  Performed at [facility], [place] [address] [place], [place] [address]          Radiology Studies: [facility] Chest Port 1 View  Result Date: 05/11/2020 CLINICAL DATA:  COVID positive. EXAM: PORTABLE CHEST 1 VIEW COMPARISON:  03/12/2020 FINDINGS: Lung volumes are low. Patchy heterogeneous bilateral airspace opacities in a mid-lower lung zone predominant distribution. The heart is normal in size with normal mediastinal contours. Small retrocardiac hiatal hernia. No pneumothorax or large pleural effusion. Chronic right AC joint separation. No acute osseous abnormalities are seen. IMPRESSION: Patchy heterogeneous bilateral airspace opacities in a mid-lower lung zone predominant distribution, pattern consistent with COVID-19 pneumonia. Electronically Signed   By: Keith Rake M.D.   On: 05/11/2020 20:23        Scheduled Meds: .  vitamin C  500 mg Oral Daily  . aspirin EC  81 mg Oral Daily  . donepezil  10 mg Oral QHS  . enoxaparin (LOVENOX) injection  40 mg Subcutaneous Q24H  . loratadine  10 mg Oral Daily  . pravastatin  40 mg Oral Daily  . zinc sulfate  220 mg Oral Daily   Continuous Infusions: . sodium chloride       LOS: 1 day    Time spent: 35 minutes    Dana Allan, MD  [facility] Pager #: [phone] 7PM-7AM contact night coverage as above

## 2020-05-12 NOTE — Plan of Care (Signed)
Pt arrived from ED by stretcher with 2 ER RNs. Pt oriented to room and clinical staff. COVID isolation precautions in place. Tele monitor applied, VSS, on RA. Denies SOB. NS infusing. Call bell in reach, side rails up x3, bed in lowest and locked position. Urinal at bedside. Encouraged pt to call with needs and before getting out of bed.  Problem: Education: Goal: Knowledge of General Education information will improve Description: Including pain rating scale, medication(s)/side effects and non-pharmacologic comfort measures Outcome: Progressing   Problem: Health Behavior/Discharge Planning: Goal: Ability to manage health-related needs will improve Outcome: Progressing   Problem: Clinical Measurements: Goal: Ability to maintain clinical measurements within normal limits will improve Outcome: Progressing Goal: Will remain free from infection Outcome: Progressing Goal: Diagnostic test results will improve Outcome: Progressing Goal: Respiratory complications will improve Outcome: Progressing Goal: Cardiovascular complication will be avoided Outcome: Progressing   Problem: Activity: Goal: Risk for activity intolerance will decrease Outcome: Progressing   Problem: Nutrition: Goal: Adequate nutrition will be maintained Outcome: Progressing   Problem: Coping: Goal: Level of anxiety will decrease Outcome: Progressing   Problem: Elimination: Goal: Will not experience complications related to bowel motility Outcome: Progressing Goal: Will not experience complications related to urinary retention Outcome: Progressing   Problem: Pain Managment: Goal: General experience of comfort will improve Outcome: Progressing   Problem: Safety: Goal: Ability to remain free from injury will improve Outcome: Progressing   Problem: Skin Integrity: Goal: Risk for impaired skin integrity will decrease Outcome: Progressing

## 2020-05-12 NOTE — ED Notes (Signed)
Report given to 5W - 

## 2020-05-12 NOTE — Evaluation (Signed)
Physical Therapy Evaluation Patient Details Name: Jeffrey [name] MRN: [id number] DOB: Sep 07, 1928 Today's Date: 05/12/2020   History of Present Illness  Jeffrey [name] is a 87 y.o. male with medical history significant for hyperlipidemia, TIA, memory loss and paranoia due to suspected dementia, who presents to the ED for evaluation of dehydration in the setting of recent COVID-19 infection.  Clinical Impression  Pt admitted with above diagnosis.  Pt currently with functional limitations due to the deficits listed below (see PT Problem List). Pt will benefit from skilled PT to increase their independence and safety with mobility to allow discharge to the venue listed below.  Pt's bed mobility MOD Independent and S to min/guard for transfers and gait without use of an assistive device. Spoke to pt about ALF and he does not seem interested at this time.  Pt acknowledged he hadn't been eating at home. Recommend HHPT for home safety evaluation.     Follow Up Recommendations Home health PT    Equipment Recommendations  None recommended by PT    Recommendations for Other Services       Precautions / Restrictions Precautions Precautions: None      Mobility  Bed Mobility Overal bed mobility: Modified Independent                  Transfers Overall transfer level: Needs assistance   Transfers: Sit to/from Stand Sit to Stand: Supervision            Ambulation/Gait Ambulation/Gait assistance: Min guard;Supervision Gait Distance (Feet): 75 Feet Assistive device: None Gait Pattern/deviations: Narrow base of support     General Gait Details: Amb in room without physical A with slightly narrowed BOS.  Stairs            Wheelchair Mobility    Modified Rankin (Stroke Patients Only)       Balance Overall balance assessment: Needs assistance   Sitting balance-Leahy Scale: Good       Standing balance-Leahy Scale: Fair Standing balance comment: Good static,  fair dynamic                             Pertinent Vitals/Pain Pain Assessment: No/denies pain    Home Living Family/patient expects to be discharged to:: Private residence Living Arrangements: Alone   Type of Home: House Home Access: Stairs to enter   Technical brewer of Steps: 1 Home Layout: One level Home Equipment: None      Prior Function Level of Independence: Independent               Hand Dominance        Extremity/Trunk Assessment   Upper Extremity Assessment Upper Extremity Assessment: Defer to OT evaluation    Lower Extremity Assessment Lower Extremity Assessment: Overall WFL for tasks assessed       Communication      Cognition Arousal/Alertness: Awake/alert Behavior During Therapy: WFL for tasks assessed/performed Overall Cognitive Status: Within Functional Limits for tasks assessed                                 General Comments: Oriented and able to remember and discuss why he is in the hospital and he recent difficulty with not eating.      General Comments      Exercises     Assessment/Plan    PT Assessment Patient needs continued PT services  PT Problem List Decreased activity tolerance;Decreased balance;Decreased mobility       PT Treatment Interventions DME instruction;Gait training;Functional mobility training;Therapeutic exercise;Therapeutic activities;Balance training    PT Goals (Current goals can be found in the Care Plan section)  Acute Rehab PT Goals Patient Stated Goal: go home PT Goal Formulation: With patient Time For Goal Achievement: 05/26/20 Potential to Achieve Goals: Good    Frequency Min 3X/week   Barriers to discharge Decreased caregiver support lives alone. Spoke with him re: ALF and he is not interested    Co-evaluation               AM-PAC PT "6 Clicks" Mobility  Outcome Measure Help needed turning from your back to your side while in a flat bed without  using bedrails?: None Help needed moving from lying on your back to sitting on the side of a flat bed without using bedrails?: None Help needed moving to and from a bed to a chair (including a wheelchair)?: A Little Help needed standing up from a chair using your arms (e.g., wheelchair or bedside chair)?: A Little Help needed to walk in hospital room?: A Little Help needed climbing 3-5 steps with a railing? : A Little 6 Click Score: 20    End of Session   Activity Tolerance: Patient tolerated treatment well Patient left: in chair;with call bell/phone within reach;Other (comment) (breakfast tray) Nurse Communication: Mobility status PT Visit Diagnosis: Difficulty in walking, not elsewhere classified (R26.2)    Time: 2482-5003 PT Time Calculation (min) (ACUTE ONLY): 22 min   Charges:   PT Evaluation $PT Eval Low Complexity: 1 Low          Kurstin Dimarzo L. Tamala Julian, [place] Pager [phone] 05/12/2020   Galen Manila 05/12/2020, 10:28 AM

## 2020-05-12 NOTE — Progress Notes (Signed)
OT Cancellation Note  Patient Details Name: Jeffrey [name] MRN: [id number] DOB: October 28, 1928   Cancelled Treatment:    Reason Eval/Treat Not Completed: Fatigue/lethargy limiting ability to participate Per RN, pt with recent diarrhea requiring full body bath and is now sleeping. RN requested letting pt sleep/rest as pt is not planning to d/c this date. OT will return later as time allows and pt is appropriate.   [facility] OTR/L Acute Rehabilitation Services Office: [place] 05/12/2020, 3:41 PM

## 2020-05-13 DIAGNOSIS — E871 Hypo-osmolality and hyponatremia: Secondary | ICD-10-CM | POA: Diagnosis not present

## 2020-05-13 LAB — PHOSPHORUS: Phosphorus: 3.2 mg/dL (ref 2.5–4.6)

## 2020-05-13 LAB — RENAL FUNCTION PANEL
Albumin: 2.8 g/dL — ABNORMAL LOW (ref 3.5–5.0)
Anion gap: 7 (ref 5–15)
BUN: 10 mg/dL (ref 8–23)
CO2: 19 mmol/L — ABNORMAL LOW (ref 22–32)
Calcium: 7.3 mg/dL — ABNORMAL LOW (ref 8.9–10.3)
Chloride: 108 mmol/L (ref 98–111)
Creatinine, Ser: 0.97 mg/dL (ref 0.61–1.24)
GFR, Estimated: >60 mL/min (ref >60)
Glucose, Bld: 86 mg/dL (ref 70–99)
Phosphorus: 3.3 mg/dL (ref 2.5–4.6)
Potassium: 3.5 mmol/L (ref 3.5–5.1)
Sodium: 134 mmol/L — ABNORMAL LOW (ref 135–145)

## 2020-05-13 LAB — CBC WITH DIFFERENTIAL/PLATELET
Abs Immature Granulocytes: 0.03 10*3/uL (ref 0.00–0.07)
Basophils Absolute: 0 10*3/uL (ref 0.0–0.1)
Basophils Relative: 1 %
Eosinophils Absolute: 0 10*3/uL (ref 0.0–0.5)
Eosinophils Relative: 0 %
HCT: 34.6 % — ABNORMAL LOW (ref 39.0–52.0)
Hemoglobin: 11.8 g/dL — ABNORMAL LOW (ref 13.0–17.0)
Immature Granulocytes: 1 %
Lymphocytes Relative: 24 %
Lymphs Abs: 0.8 10*3/uL (ref 0.7–4.0)
MCH: 32 pg (ref 26.0–34.0)
MCHC: 34.1 g/dL (ref 30.0–36.0)
MCV: 93.8 fL (ref 80.0–100.0)
Monocytes Absolute: 0.5 10*3/uL (ref 0.1–1.0)
Monocytes Relative: 15 %
Neutro Abs: 2 10*3/uL (ref 1.7–7.7)
Neutrophils Relative %: 59 %
Platelets: 137 10*3/uL — ABNORMAL LOW (ref 150–400)
RBC: 3.69 MIL/uL — ABNORMAL LOW (ref 4.22–5.81)
RDW: 12.6 % (ref 11.5–15.5)
WBC: 3.3 10*3/uL — ABNORMAL LOW (ref 4.0–10.5)
nRBC: 0 % (ref 0.0–0.2)

## 2020-05-13 LAB — MAGNESIUM: Magnesium: 2.2 mg/dL (ref 1.7–2.4)

## 2020-05-13 LAB — COMPREHENSIVE METABOLIC PANEL
ALT: 24 U/L (ref 0–44)
AST: 34 U/L (ref 15–41)
Albumin: 2.8 g/dL — ABNORMAL LOW (ref 3.5–5.0)
Alkaline Phosphatase: 44 U/L (ref 38–126)
Anion gap: 8 (ref 5–15)
BUN: 11 mg/dL (ref 8–23)
CO2: 19 mmol/L — ABNORMAL LOW (ref 22–32)
Calcium: 7.4 mg/dL — ABNORMAL LOW (ref 8.9–10.3)
Chloride: 108 mmol/L (ref 98–111)
Creatinine, Ser: 0.98 mg/dL (ref 0.61–1.24)
GFR, Estimated: >60 mL/min (ref >60)
Glucose, Bld: 87 mg/dL (ref 70–99)
Potassium: 3.5 mmol/L (ref 3.5–5.1)
Sodium: 135 mmol/L (ref 135–145)
Total Bilirubin: 0.6 mg/dL (ref 0.3–1.2)
Total Protein: 4.7 g/dL — ABNORMAL LOW (ref 6.5–8.1)

## 2020-05-13 NOTE — Progress Notes (Signed)
Bed alarm going off and pt was getting out of bed to go to the BR by himself forgetting he was attached to an IV. Urinary incontinence noted, as he forgot he had a urinal to use. Kept asking what was going on and why all " this stuff" is attached to him. Multiple re-explanations as to why he was in the hospital, why he is on telemetry, why he has IV fluids. Reminded pt of the need for callling for help, Bed alarm on, bed in low position

## 2020-05-13 NOTE — Progress Notes (Signed)
Pt confused today with situation and time. Unsteady on feet and needs assist to BR/chair. Pt coughing up lots of clear phlegm today. Still without need for O2 at this time. Pt is on telemetry and pulse ox. Continuing to monitor and make frequent contacts with pt.

## 2020-05-13 NOTE — Progress Notes (Signed)
PROGRESS NOTE    Jeffrey [name]  TOI:[id number] DOB: 1928-10-27 DOA: 05/11/2020 PCP: Tonia Ghent, MD  Outpatient Specialists:    Brief Narrative:  As per HPI done on admission: "Jeffrey [name] is a 87 y.o. male with medical history significant for hyperlipidemia, TIA, memory loss and paranoia due to suspected dementia, who presents to the ED for evaluation of dehydration in the setting of recent COVID-19 infection.  Patient was initially seen in the ED 05/04/2020 for URI symptoms.  COVID-19 test was positive.  He was saturating well on room air.  Patient declined further evaluation with chest x-ray per ED documentation.  He was noted to have poor insight but otherwise alert, oriented, and mentating well therefore was discharged to home.  He has been followed with [facility] palliative care team who saw him earlier today. Patient reported nausea, decreased urine output to the palliative nurse.  He was noted to have ongoing confusion and repetitive speech.  He is complaining of congestion and allergies.  He was noted to be saturating well on room air.  After the visit, patient called EMS reportedly due to not managing well at home.  Patient states that he has had a change in his taste which has significantly diminishes oral intake over the last 2 days.  He says he has been having ongoing sinus drainage which caused him to cough up clear sputum.  He otherwise denies any subjective fevers, chills, diaphoresis, nausea, vomiting, abdominal pain, chest pain, dyspnea, diarrhea, or dysuria.  He is calm, awake, and alert.  He is oriented to self.  He knows he is in the hospital and that he is in [place] but not sure which hospital.  He is unable to tell me the year, instead answering with 60-61 or 70-71.  He says he has 2 lawyers who are stealing from him.  ED Course:  Initial vitals showed BP 142/59, pulse 71, RR 18, temp 97.9 F, SPO2 99% on room air.  Labs notable for sodium 126,  potassium 3.3, chloride 95, bicarb 20, BUN 16, creatinine 1.18, serum glucose 91, AST 41, ALT 26, alk phos 54, total bilirubin 0.8, WBC 5.2, hemoglobin 13.3, platelets 141,000, D-dimer 1.84, procalcitonin <0.10, CRP 0.6, ferritin 76.  Portable chest x-ray showed mild patchy airspace opacities in the mid to lower lung zones bilaterally.  Patient was given 500 cc normal saline followed by maintenance fluids.  The hospitalist service was consulted to admit for further evaluation and management".  05/12/2020: Patient seen.  Discussed with the patient's nurse extensively.  We will continue volume resuscitation.  We will monitor correct abnormal electrolytes.  Diarrhea stools noted.  Patient is on room air.  Patient is likely failing to thrive at home.  We will pursue PT OT consult, as well as, social work input.  Patient may need placement.  05/13/2020: Patient seen.  PT OT input is appreciated.  For likely skilled nursing facility placement.  We will involve Education officer, museum.  No shortness of breath.  Hypokalemia has resolved.  Hyponatremia is resolving.  Volume depletion has resolved significantly.  Assessment & Plan:   Principal Problem:   Hyponatremia Active Problems:   HLD (hyperlipidemia)   Memory change   Hypokalemia  Hyponatremia/hypokalemia/dehydration: -Patient has had poor p.o. intake.  Patient is also having diarrhea. -Suspect secondary to volume depletion/diarrhea.   -Continue IV fluids. -Monitor and replete electrolyte abnormalities.   -Monitor renal function closely and volume status. 05/13/2020: Improving.  Continue to monitor closely.  No diarrhea  today.  Dementia/memory impairment with failure to thrive: -PT OT consult. -Social worker consult. Patient may need placement. Continue Aricept.    COVID-19 viral infection: -SARS-CoV-2 PCR + 05/04/2020.   -CXR with question of mild infiltrate.   -Patient has mild URI symptoms and change in taste resulting in poor oral intake but  otherwise stable from a respiratory standpoint.   -Saturated 100% on room air  -Denies any shortness of breath or GI symptoms.   -Continue to monitor, no indication for remdesivir or steroids at this time.  Generalized weakness: -Correct electrolytes abnormalities -Continue rehydration. -PT OT input is appreciated.  Likely skilled nursing facility placement.    Hyperlipidemia: Continue pravastatin.   DVT prophylaxis: Lovenox Code Status: Full code Family Communication:  Disposition Plan: This will depend on hospital course.  Likely, patient may need to be placed   Consultants:   None  Procedures:   None  Antimicrobials:   None   Subjective: No shortness of breath.  No diarrhea today.  Objective: Vitals:   05/12/20 1351 05/12/20 2024 05/13/20 0417 05/13/20 1343  BP: (!) 119/46 (!) 108/44 119/61 111/79  Pulse: 77 70 67 83  Resp: 15 (!) 21 (!) 23 15  Temp: 97.6 F (36.4 C) 99 F (37.2 C) 98.5 F (36.9 C) 98 F (36.7 C)  TempSrc:  Oral Oral   SpO2: 98% 99% 97% 98%  Weight:      Height:        Intake/Output Summary (Last 24 hours) at 05/13/2020 1908 Last data filed at 05/13/2020 1800 Gross per 24 hour  Intake 1142 ml  Output 475 ml  Net 667 ml   Filed Weights   05/11/20 2016  Weight: 65.8 kg    Examination:  General exam: Appears calm and comfortable.  Patient is not in any distress. Respiratory system: Clear to auscultation.  Cardiovascular system: S1 & S2 Gastrointestinal system: Abdomen is nondistended, soft and nontender. No organomegaly or masses felt. Normal bowel sounds heard. Central nervous system: Awake and alert.  Patient moves all extremities.   Extremities: No leg edema  Data Reviewed: I have personally reviewed following labs and imaging studies  CBC: Recent Labs  Lab 05/11/20 2044 05/13/20 0443  WBC 5.2 3.3*  NEUTROABS 3.5 2.0  HGB 13.3 11.8*  HCT 36.9* 34.6*  MCV 88.9 93.8  PLT 141* 326*   Basic Metabolic  Panel: Recent Labs  Lab 05/11/20 2044 05/12/20 1408 05/13/20 0443  NA 126* 129* 135  134*  K 3.3* 4.1 3.5  3.5  CL 95* 102 108  108  CO2 20* 17* 19*  19*  GLUCOSE 91 94 87  86  BUN _0 CREATININE 1.18 0.98 0.98  0.97  CALCIUM 8.3* 7.6* 7.4*  7.3*  MG 2.2 2.2 2.2  PHOS  --  3.0 3.2  3.3   GFR: Estimated Creatinine Clearance: 47.6 mL/min (by C-G formula based on SCr of 0.98 mg/dL). Liver Function Tests: Recent Labs  Lab 05/11/20 2044 05/12/20 1408 05/13/20 0443  AST 41  --  34  ALT 26  --  24  ALKPHOS 54  --  44  BILITOT 0.8  --  0.6  PROT 6.1*  --  4.7*  ALBUMIN 3.6 3.2* 2.8*  2.8*   No results for input(s): LIPASE, AMYLASE in the last 168 hours. No results for input(s): AMMONIA in the last 168 hours. Coagulation Profile: No results for input(s): INR, PROTIME in the last 168 hours. Cardiac Enzymes: No  results for input(s): CKTOTAL, CKMB, CKMBINDEX, TROPONINI in the last 168 hours. BNP (last 3 results) No results for input(s): PROBNP in the last 8760 hours. HbA1C: No results for input(s): HGBA1C in the last 72 hours. CBG: No results for input(s): GLUCAP in the last 168 hours. Lipid Profile: Recent Labs    05/11/20 2044  TRIG 77   Thyroid Function Tests: No results for input(s): TSH, T4TOTAL, FREET4, T3FREE, THYROIDAB in the last 72 hours. Anemia Panel: Recent Labs    05/11/20 2044  FERRITIN 76   Urine analysis:    Component Value Date/Time   COLORURINE YELLOW 05/11/2020 2252   APPEARANCEUR CLEAR 05/11/2020 2252   LABSPEC 1.008 05/11/2020 2252   PHURINE 6.0 05/11/2020 2252   GLUCOSEU NEGATIVE 05/11/2020 2252   HGBUR NEGATIVE 05/11/2020 2252   BILIRUBINUR NEGATIVE 05/11/2020 2252   BILIRUBINUR Neg 05/11/2019 1720   KETONESUR 5 (A) 05/11/2020 2252   PROTEINUR NEGATIVE 05/11/2020 2252   UROBILINOGEN 0.2 05/11/2019 1720   NITRITE NEGATIVE 05/11/2020 2252   LEUKOCYTESUR NEGATIVE 05/11/2020 2252   Sepsis  Labs: _0 (procalcitonin:4,lacticidven:4)  ) Recent Results (from the past 240 hour(s))  Resp Panel by RT-PCR (Flu A&B, Covid) Nasopharyngeal Swab     Status: Abnormal   Collection Time: 05/04/20 10:24 PM   Specimen: Nasopharyngeal Swab; Nasopharyngeal(NP) swabs in vial transport medium  Result Value Ref Range Status   SARS Coronavirus 2 by RT PCR POSITIVE (A) NEGATIVE Final    Comment: CRITICAL RESULT CALLED TO, READ BACK BY AND VERIFIED WITH: HOLLY L. RN 01/01/20211 _1  BY P.HNDERSON (NOTE) SARS-CoV-2 target nucleic acids are DETECTED.  The SARS-CoV-2 RNA is generally detectable in upper respiratory specimens during the acute phase of infection. Positive results are indicative of the presence of the identified virus, but do not rule out bacterial infection or co-infection with other pathogens not detected by the test. Clinical correlation with patient history and other diagnostic information is necessary to determine patient infection status. The expected result is Negative.  Fact Sheet for Patients: EntrepreneurPulse.com.au  Fact Sheet for Healthcare Providers: IncredibleEmployment.be  This test is not yet approved or cleared by the [place] FDA and  has been authorized for detection and/or diagnosis of SARS-CoV-2 by FDA under an Emergency Use Authorization (EUA).  This EUA will remain in effect (meani ng this test can be used) for the duration of  the COVID-19 declaration under Section 564(b)(1) of the Act, 21 U.S.C. section 360bbb-3(b)(1), unless the authorization is terminated or revoked sooner.     Influenza A by PCR NEGATIVE NEGATIVE Final   Influenza B by PCR NEGATIVE NEGATIVE Final    Comment: (NOTE) The Xpert Xpress SARS-CoV-2/FLU/RSV plus assay is intended as an aid in the diagnosis of influenza from Nasopharyngeal swab specimens and should not be used as a sole basis for treatment. Nasal washings and aspirates  are unacceptable for Xpert Xpress SARS-CoV-2/FLU/RSV testing.  Fact Sheet for Patients: EntrepreneurPulse.com.au  Fact Sheet for Healthcare Providers: IncredibleEmployment.be  This test is not yet approved or cleared by the [place] FDA and has been authorized for detection and/or diagnosis of SARS-CoV-2 by FDA under an Emergency Use Authorization (EUA). This EUA will remain in effect (meaning this test can be used) for the duration of the COVID-19 declaration under Section 564(b)(1) of the Act, 21 U.S.C. section 360bbb-3(b)(1), unless the authorization is terminated or revoked.  Performed at [facility], [place] [address] [place], [place] [address]   Blood Culture (routine x 2)  Status: None (Preliminary result)   Collection Time: 05/11/20  8:44 PM   Specimen: BLOOD  Result Value Ref Range Status   Specimen Description   Final    BLOOD LEFT ANTECUBITAL Performed at [facility] [address] [place], [place] [address]    Special Requests   Final    BOTTLES DRAWN AEROBIC AND ANAEROBIC Blood Culture adequate volume Performed at [facility] [address], [place], [place] [address]    Culture   Final    NO GROWTH 1 DAY Performed at [facility], [place] [address], [place], [place] [address]    Report Status PENDING  Incomplete  Blood Culture (routine x 2)     Status: None (Preliminary result)   Collection Time: 05/12/20  5:00 AM   Specimen: BLOOD  Result Value Ref Range Status   Specimen Description   Final    BLOOD RIGHT ANTECUBITAL Performed at [facility] [address] [place], [place] [address]    Special Requests   Final    BOTTLES DRAWN AEROBIC AND ANAEROBIC Blood Culture adequate volume Performed at [facility] [address], [place], [place] [address]    Culture   Final    NO GROWTH 1 DAY Performed at [facility], [place] [address] [place],  [address]    Report Status PENDING  Incomplete         Radiology Studies: DG Chest Port 1 View  Result Date: 05/11/2020 CLINICAL DATA:  COVID positive. EXAM: PORTABLE CHEST 1 VIEW COMPARISON:  03/12/2020 FINDINGS: Lung volumes are low. Patchy heterogeneous bilateral airspace opacities in a mid-lower lung zone predominant distribution. The heart is normal in size with normal mediastinal contours. Small retrocardiac hiatal hernia. No pneumothorax or large pleural effusion. Chronic right AC joint separation. No acute osseous abnormalities are seen. IMPRESSION: Patchy heterogeneous bilateral airspace opacities in a mid-lower lung zone predominant distribution, pattern consistent with COVID-19 pneumonia. Electronically Signed   By: Keith Rake M.D.   On: 05/11/2020 20:23        Scheduled Meds: . vitamin C  500 mg Oral Daily  . aspirin EC  81 mg Oral Daily  . donepezil  10 mg Oral QHS  . enoxaparin (LOVENOX) injection  40 mg Subcutaneous Q24H  . loratadine  10 mg Oral Daily  . pravastatin  40 mg Oral Daily  . zinc sulfate  220 mg Oral Daily   Continuous Infusions:    LOS: 2 days    Time spent: 35 minutes    Dana Allan, MD  [facility] Pager #: [phone] 7PM-7AM contact night coverage as above
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
## 2020-05-14 ENCOUNTER — Telehealth: Payer: Self-pay | Admitting: *Deleted

## 2020-05-14 DIAGNOSIS — E871 Hypo-osmolality and hyponatremia: Secondary | ICD-10-CM | POA: Diagnosis not present

## 2020-05-14 LAB — CBC WITH DIFFERENTIAL/PLATELET
Abs Immature Granulocytes: 0.02 10*3/uL (ref 0.00–0.07)
Basophils Absolute: 0 10*3/uL (ref 0.0–0.1)
Basophils Relative: 0 %
Eosinophils Absolute: 0 10*3/uL (ref 0.0–0.5)
Eosinophils Relative: 0 %
HCT: 33.3 % — ABNORMAL LOW (ref 39.0–52.0)
Hemoglobin: 11.6 g/dL — ABNORMAL LOW (ref 13.0–17.0)
Immature Granulocytes: 1 %
Lymphocytes Relative: 32 %
Lymphs Abs: 1 10*3/uL (ref 0.7–4.0)
MCH: 32 pg (ref 26.0–34.0)
MCHC: 34.8 g/dL (ref 30.0–36.0)
MCV: 91.7 fL (ref 80.0–100.0)
Monocytes Absolute: 0.5 10*3/uL (ref 0.1–1.0)
Monocytes Relative: 17 %
Neutro Abs: 1.5 10*3/uL — ABNORMAL LOW (ref 1.7–7.7)
Neutrophils Relative %: 50 %
Platelets: 140 10*3/uL — ABNORMAL LOW (ref 150–400)
RBC: 3.63 MIL/uL — ABNORMAL LOW (ref 4.22–5.81)
RDW: 12.6 % (ref 11.5–15.5)
WBC: 3 10*3/uL — ABNORMAL LOW (ref 4.0–10.5)
nRBC: 0 % (ref 0.0–0.2)

## 2020-05-14 LAB — COMPREHENSIVE METABOLIC PANEL
ALT: 30 U/L (ref 0–44)
AST: 42 U/L — ABNORMAL HIGH (ref 15–41)
Albumin: 2.7 g/dL — ABNORMAL LOW (ref 3.5–5.0)
Alkaline Phosphatase: 44 U/L (ref 38–126)
Anion gap: 8 (ref 5–15)
BUN: 13 mg/dL (ref 8–23)
CO2: 19 mmol/L — ABNORMAL LOW (ref 22–32)
Calcium: 7.4 mg/dL — ABNORMAL LOW (ref 8.9–10.3)
Chloride: 106 mmol/L (ref 98–111)
Creatinine, Ser: 1.03 mg/dL (ref 0.61–1.24)
GFR, Estimated: >60 mL/min (ref >60)
Glucose, Bld: 87 mg/dL (ref 70–99)
Potassium: 3.5 mmol/L (ref 3.5–5.1)
Sodium: 133 mmol/L — ABNORMAL LOW (ref 135–145)
Total Bilirubin: 0.6 mg/dL (ref 0.3–1.2)
Total Protein: 4.7 g/dL — ABNORMAL LOW (ref 6.5–8.1)

## 2020-05-14 LAB — PHOSPHORUS: Phosphorus: 3 mg/dL (ref 2.5–4.6)

## 2020-05-14 LAB — MAGNESIUM: Magnesium: 2.1 mg/dL (ref 1.7–2.4)

## 2020-05-14 MED ORDER — POTASSIUM CHLORIDE CRYS ER 20 MEQ PO TBCR
40.0000 meq | EXTENDED_RELEASE_TABLET | Freq: Once | ORAL | Status: AC
  Administered 2020-05-14: 40 meq via ORAL
  Filled 2020-05-14: qty 2

## 2020-05-14 NOTE — Telephone Encounter (Signed)
Charlsie Green (?) from [facility] left a voicemail stating that he was informed by the patient's son that he is a patient here. Mr. [name] stated that the patient ended up calling 911. Mr. [name] stated that the patient is at [facility] now. Mr. [name] stated that he needed to talk with the nurse here that has been handling the situation with the patient. Mr. [name] stated that he would also like to talk with the Education officer, museum .
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
## 2020-05-14 NOTE — Progress Notes (Signed)
[facility] ([facility]) Community Based Palliative Care       This patient is enrolled in our palliative care services in the community.  [facility] will continue to follow for any discharge planning needs and to coordinate continuation of palliative care.   If you have questions or need assistance, please call [phone] or contact the hospital Liaison listed on AMION.     Thank you for the opportunity to participate in this patient's care.     Chrislyn King, BSN, RN [facility] Hospital Liaison   [phone]   

## 2020-05-14 NOTE — Telephone Encounter (Signed)
Left a VM for Jeffrey [name] with direct office number. On his VM he left his managers number is the call is not returned in 48 hours to contact him at [phone]. LVM for him to contact this nurse at office number.
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
## 2020-05-14 NOTE — Telephone Encounter (Signed)
Discussed with PCP and he is aware. We are awaiting report

## 2020-05-14 NOTE — Telephone Encounter (Signed)
Agreed.  Will await inpatient report.  Thanks.

## 2020-05-14 NOTE — Care Management Important Message (Signed)
Important Message  Patient Details IM Letter given to the Patient. Name: Jeffrey [name] MRN: [id number] Date of Birth: [birth year]   Medicare Important Message Given:  Yes     Kerin Salen 05/14/2020, 11:22 AM

## 2020-05-15 DIAGNOSIS — E871 Hypo-osmolality and hyponatremia: Secondary | ICD-10-CM | POA: Diagnosis not present

## 2020-05-15 LAB — COMPREHENSIVE METABOLIC PANEL
ALT: 28 U/L (ref 0–44)
AST: 37 U/L (ref 15–41)
Albumin: 2.8 g/dL — ABNORMAL LOW (ref 3.5–5.0)
Alkaline Phosphatase: 47 U/L (ref 38–126)
Anion gap: 9 (ref 5–15)
BUN: 13 mg/dL (ref 8–23)
CO2: 20 mmol/L — ABNORMAL LOW (ref 22–32)
Calcium: 7.5 mg/dL — ABNORMAL LOW (ref 8.9–10.3)
Chloride: 106 mmol/L (ref 98–111)
Creatinine, Ser: 0.9 mg/dL (ref 0.61–1.24)
GFR, Estimated: >60 mL/min (ref >60)
Glucose, Bld: 99 mg/dL (ref 70–99)
Potassium: 3.3 mmol/L — ABNORMAL LOW (ref 3.5–5.1)
Sodium: 135 mmol/L (ref 135–145)
Total Bilirubin: 0.8 mg/dL (ref 0.3–1.2)
Total Protein: 5 g/dL — ABNORMAL LOW (ref 6.5–8.1)

## 2020-05-15 LAB — CBC WITH DIFFERENTIAL/PLATELET
Abs Immature Granulocytes: 0.01 10*3/uL (ref 0.00–0.07)
Basophils Absolute: 0 10*3/uL (ref 0.0–0.1)
Basophils Relative: 0 %
Eosinophils Absolute: 0 10*3/uL (ref 0.0–0.5)
Eosinophils Relative: 0 %
HCT: 34.3 % — ABNORMAL LOW (ref 39.0–52.0)
Hemoglobin: 12.1 g/dL — ABNORMAL LOW (ref 13.0–17.0)
Immature Granulocytes: 0 %
Lymphocytes Relative: 19 %
Lymphs Abs: 0.6 10*3/uL — ABNORMAL LOW (ref 0.7–4.0)
MCH: 32.1 pg (ref 26.0–34.0)
MCHC: 35.3 g/dL (ref 30.0–36.0)
MCV: 91 fL (ref 80.0–100.0)
Monocytes Absolute: 0.5 10*3/uL (ref 0.1–1.0)
Monocytes Relative: 15 %
Neutro Abs: 2 10*3/uL (ref 1.7–7.7)
Neutrophils Relative %: 66 %
Platelets: 139 10*3/uL — ABNORMAL LOW (ref 150–400)
RBC: 3.77 MIL/uL — ABNORMAL LOW (ref 4.22–5.81)
RDW: 12.6 % (ref 11.5–15.5)
WBC: 3.2 10*3/uL — ABNORMAL LOW (ref 4.0–10.5)
nRBC: 0 % (ref 0.0–0.2)

## 2020-05-15 LAB — MAGNESIUM: Magnesium: 2.3 mg/dL (ref 1.7–2.4)

## 2020-05-15 LAB — PHOSPHORUS: Phosphorus: 2.6 mg/dL (ref 2.5–4.6)

## 2020-05-15 MED ORDER — POTASSIUM CHLORIDE CRYS ER 20 MEQ PO TBCR: 40.0000 meq | EXTENDED_RELEASE_TABLET | Freq: Two times a day (BID) | ORAL | 0 refills | Status: DC

## 2020-05-15 MED ORDER — POTASSIUM CHLORIDE CRYS ER 20 MEQ PO TBCR
40.0000 meq | EXTENDED_RELEASE_TABLET | Freq: Two times a day (BID) | ORAL | Status: DC
  Administered 2020-05-15: 40 meq via ORAL
  Filled 2020-05-15: qty 2

## 2020-05-15 MED ORDER — ZINC SULFATE 220 (50 ZN) MG PO CAPS: 220.0000 mg | ORAL_CAPSULE | Freq: Every day | ORAL | 0 refills | Status: DC

## 2020-05-15 MED ORDER — ASCORBIC ACID 500 MG PO TABS: 500.0000 mg | ORAL_TABLET | Freq: Every day | ORAL | 0 refills | Status: DC
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
## 2020-05-15 NOTE — Progress Notes (Signed)
PT Cancellation Note  Patient Details Name: Jeffrey [name] MRN: [id number] DOB: 03-30-1929   Cancelled Treatment:    Reason Eval/Treat Not Completed: Fatigue/lethargy limiting ability to participate (pt reported he's exhausted from a busy morning and wants to rest for a few hours. Will follow.)  Philomena Doheny PT 05/15/2020  Acute Rehabilitation Services Pager [phone] Office [phone]

## 2020-05-15 NOTE — Progress Notes (Addendum)
Went over discharge instructions w/ pt and called patient son Alroy Dust and went over the instructions w/ him. Pt and son verbalized understanding.
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
## 2020-05-15 NOTE — Clinical Social Work Note (Signed)
Received call from [employer] transport driver while at patient's home.  He stated that patient is able to get into the home safely, but is concerned about patient due to his level of confusion, and wondered if he should be waiting there until someone else arrives to stay with patient.  I explained that patient's only other relative is his sister who does not drive, and he is estranged from his son.  There is no one coming.  I let him know that the MD was aware of the level of confusion prior to d/c, and it was OK for him to leave.

## 2020-05-15 NOTE — TOC Transition Note (Signed)
Transition of Care [facility]) - CM/SW Discharge Note   Patient Details  Name: AURELIO MCCAMY MRN: [id number] Date of Birth: 12-02-1928  Transition of Care [facility]) CM/SW Contact:  Trish Mage, LCSW Phone Number: 05/15/2020, 11:16 AM   Clinical Narrative:   Patient who is d/cing today is in need of a ride home. I contacted [facility] transport who will give him a ride home. I contacted son to let him know of d/c.  I contacted Mr Carlota Raspberry with [facility], [phone], and let him know of d/c.  I contacted PCP office and got him an appointment.  I contacted Cindie with [facility] and set him up for home health services.  No further needs identified.  TOC sign off.    Final next level of care: [place] Barriers to Discharge: No Barriers Identified   Patient Goals and CMS Choice Patient states their goals for this hospitalization and ongoing recovery are:: "I need my vitamins that I was taking at home."      Discharge Placement                       Discharge Plan and Services   Discharge Planning Services: CM Consult Post Acute Care Choice: Home Health                               Social Determinants of Health (SDOH) Interventions     Readmission Risk Interventions No flowsheet data found.

## 2020-05-16 NOTE — Telephone Encounter (Signed)
Noted.  I'll await palliative care input.  Per chart, family is aware of situation. I have called [facility] prev and we have offered services to patient, along with [facility], but the patient had declined services/denied need for help prev.

## 2020-05-17 ENCOUNTER — Emergency Department (HOSPITAL_COMMUNITY): Payer: Medicare Other

## 2020-05-17 ENCOUNTER — Other Ambulatory Visit: Payer: Self-pay

## 2020-05-17 ENCOUNTER — Emergency Department (HOSPITAL_COMMUNITY)
Admission: EM | Admit: 2020-05-17 | Discharge: 2020-05-18 | Disposition: A | Discharge status: Home / Self Care | Payer: Medicare Other | Attending: Emergency Medicine | Admitting: Emergency Medicine

## 2020-05-17 ENCOUNTER — Telehealth: Payer: Self-pay

## 2020-05-17 ENCOUNTER — Encounter (HOSPITAL_COMMUNITY): Payer: Self-pay | Admitting: Emergency Medicine

## 2020-05-17 DIAGNOSIS — R0981 Nasal congestion: Secondary | ICD-10-CM | POA: Diagnosis not present

## 2020-05-17 DIAGNOSIS — Z87891 Personal history of nicotine dependence: Secondary | ICD-10-CM | POA: Diagnosis not present

## 2020-05-17 DIAGNOSIS — R45851 Suicidal ideations: Secondary | ICD-10-CM | POA: Diagnosis not present

## 2020-05-17 DIAGNOSIS — R059 Cough, unspecified: Secondary | ICD-10-CM

## 2020-05-17 DIAGNOSIS — J189 Pneumonia, unspecified organism: Secondary | ICD-10-CM | POA: Diagnosis not present

## 2020-05-17 DIAGNOSIS — R5381 Other malaise: Secondary | ICD-10-CM | POA: Diagnosis not present

## 2020-05-17 DIAGNOSIS — U071 COVID-19: Secondary | ICD-10-CM | POA: Diagnosis not present

## 2020-05-17 DIAGNOSIS — Z85828 Personal history of other malignant neoplasm of skin: Secondary | ICD-10-CM | POA: Diagnosis not present

## 2020-05-17 DIAGNOSIS — Z743 Need for continuous supervision: Secondary | ICD-10-CM | POA: Diagnosis not present

## 2020-05-17 LAB — CBC WITH DIFFERENTIAL/PLATELET
Abs Immature Granulocytes: 0.01 10*3/uL (ref 0.00–0.07)
Basophils Absolute: 0 10*3/uL (ref 0.0–0.1)
Basophils Relative: 0 %
Eosinophils Absolute: 0 10*3/uL (ref 0.0–0.5)
Eosinophils Relative: 0 %
HCT: 35.8 % — ABNORMAL LOW (ref 39.0–52.0)
Hemoglobin: 12.4 g/dL — ABNORMAL LOW (ref 13.0–17.0)
Immature Granulocytes: 0 %
Lymphocytes Relative: 13 %
Lymphs Abs: 0.5 10*3/uL — ABNORMAL LOW (ref 0.7–4.0)
MCH: 32.1 pg (ref 26.0–34.0)
MCHC: 34.6 g/dL (ref 30.0–36.0)
MCV: 92.7 fL (ref 80.0–100.0)
Monocytes Absolute: 0.5 10*3/uL (ref 0.1–1.0)
Monocytes Relative: 14 %
Neutro Abs: 2.7 10*3/uL (ref 1.7–7.7)
Neutrophils Relative %: 73 %
Platelets: 163 10*3/uL (ref 150–400)
RBC: 3.86 MIL/uL — ABNORMAL LOW (ref 4.22–5.81)
RDW: 12.9 % (ref 11.5–15.5)
WBC: 3.8 10*3/uL — ABNORMAL LOW (ref 4.0–10.5)
nRBC: 0 % (ref 0.0–0.2)

## 2020-05-17 LAB — BASIC METABOLIC PANEL
Anion gap: 13 (ref 5–15)
BUN: 24 mg/dL — ABNORMAL HIGH (ref 8–23)
CO2: 21 mmol/L — ABNORMAL LOW (ref 22–32)
Calcium: 8.1 mg/dL — ABNORMAL LOW (ref 8.9–10.3)
Chloride: 102 mmol/L (ref 98–111)
Creatinine, Ser: 1.12 mg/dL (ref 0.61–1.24)
GFR, Estimated: >60 mL/min (ref >60)
Glucose, Bld: 95 mg/dL (ref 70–99)
Potassium: 4 mmol/L (ref 3.5–5.1)
Sodium: 136 mmol/L (ref 135–145)

## 2020-05-17 LAB — CULTURE, BLOOD (ROUTINE X 2)
Culture: NO GROWTH
Culture: NO GROWTH
Special Requests: ADEQUATE
Special Requests: ADEQUATE

## 2020-05-17 MED ORDER — GUAIFENESIN 100 MG/5ML PO SYRP: 100.0000 mg | ORAL_SOLUTION | Freq: Four times a day (QID) | ORAL | 0 refills | Status: DC | PRN

## 2020-05-17 MED ORDER — MEMANTINE HCL 5 MG PO TABS
10.0000 mg | ORAL_TABLET | Freq: Two times a day (BID) | ORAL | Status: DC
  Administered 2020-05-17 – 2020-05-18 (×3): 10 mg via ORAL
  Filled 2020-05-17 (×2): qty 2

## 2020-05-17 MED ORDER — TRIAMCINOLONE ACETONIDE 55 MCG/ACT NA AERO: 2.0000 | INHALATION_SPRAY | NASAL | 0 refills | Status: DC

## 2020-05-17 MED ORDER — POTASSIUM CHLORIDE CRYS ER 20 MEQ PO TBCR
40.0000 meq | EXTENDED_RELEASE_TABLET | Freq: Two times a day (BID) | ORAL | Status: DC
  Administered 2020-05-17 – 2020-05-18 (×3): 40 meq via ORAL
  Filled 2020-05-17 (×2): qty 2

## 2020-05-17 MED ORDER — ASPIRIN EC 81 MG PO TBEC
81.0000 mg | DELAYED_RELEASE_TABLET | Freq: Every day | ORAL | Status: DC
  Administered 2020-05-17 – 2020-05-18 (×2): 81 mg via ORAL
  Filled 2020-05-17 (×2): qty 1

## 2020-05-17 MED ORDER — DONEPEZIL HCL 5 MG PO TABS
10.0000 mg | ORAL_TABLET | Freq: Every day | ORAL | Status: DC
  Administered 2020-05-17: 10 mg via ORAL

## 2020-05-17 MED ORDER — SIMETHICONE 80 MG PO CHEW
125.0000 mg | CHEWABLE_TABLET | Freq: Three times a day (TID) | ORAL | Status: DC
  Administered 2020-05-17 – 2020-05-18 (×2): 120 mg via ORAL
  Filled 2020-05-17 (×4): qty 2

## 2020-05-17 MED ORDER — TRIAMCINOLONE ACETONIDE 55 MCG/ACT NA AERO: 2.0000 | INHALATION_SPRAY | NASAL | Status: DC

## 2020-05-17 MED ORDER — LORATADINE 10 MG PO TABS
10.0000 mg | ORAL_TABLET | Freq: Every day | ORAL | Status: DC
  Administered 2020-05-17 – 2020-05-18 (×2): 10 mg via ORAL
  Filled 2020-05-17 (×2): qty 1

## 2020-05-17 NOTE — ED Provider Notes (Signed)
Medical screening examination/treatment/procedure(s) were conducted as a shared visit with non-physician practitioner(s) and myself.  I personally evaluated the patient during the encounter.     Patient seen by me along with physician assistant.  Patient with diagnosis of COVID-19 infection beginning of the month.  Patient oxygen sats are good currently patient was concerned about the electrolytes being off sodium being low being feeling as if he was dehydrated.  Electrolytes here look good.  Patient is having a productive cough but has no indications for admission at this time.    Fredia Sorrow, MD 05/17/20 [phone]

## 2020-05-17 NOTE — Telephone Encounter (Signed)
930 am.  Phone call made to patient to complete a check in after hospital discharge.  No answer but message has been left on VM requesting call back.  Per Epic, patient went to the ED this am.  Likely remains in the ED at the time of this call.  I will follow up later today if I do not get a call back from patient.

## 2020-05-17 NOTE — ED Notes (Signed)
Jeffrey [name] [phone] wants update.

## 2020-05-17 NOTE — ED Provider Notes (Signed)
[facility] DEPT Provider Note   CSN: [id number] Arrival date & time: 05/17/20  8315     History No chief complaint on file.   Jeffrey [name] is a 87 y.o. male presenting for evaluation nasal congestion, cough, feeling dehydrated.  Level 5 caveat due to dementia.  Patient states he feels very dehydrated.  He states that his lips, mouth, and chest feel dry.  He reports persistent pressure and congestion in his head and chest.  He is not taking anything for this.  He denies fevers, chest pain, shortness of breath, nausea, vomiting, domino pain.  He is drinking fluids without difficulty.  He is concerned about his ability to take care of himself, as his wife died a year and a half ago and he lives at home in his house.  He states he has been going back and forth in the hospital ever since being diagnosed with COVID, has not felt well since then.  Additional history obtained from chart review.  This is patient's third visit since he was diagnosed with COVID in the beginning of January of this year.  He was admitted several days ago for hyponatremia, discharged 2 days ago.  Additional history of dementia, hyperlipidemia, TIA.  Per chart review, he has home health services and palliative care evaluation.  HPI     Past Medical History:  Diagnosis Date  . Adenomatous polyp of colon 1995    w/ HGD  . Allergic rhinitis   . BPH (benign prostatic hyperplasia)   . Diverticulosis 09/21/2000  . ED (erectile dysfunction)   . Hyperlipemia 05/05/1988  . Memory loss   . Schamberg's purpura    2012- eval by Dr. Allyson Sabal  . Sebaceous cyst   . Skin cancer 05/05/1998   hx of Scalp-melanoma  . TIA (transient ischemic attack)     Patient Active Problem List   Diagnosis Date Noted  . Hyponatremia 05/11/2020  . Hypokalemia 05/11/2020  . Dizziness 03/07/2020  . Bilateral cold feet 03/07/2020  . Infected sebaceous cyst 01/25/2020  . Rectal bleeding 09/26/2019  . Chronic  constipation 09/19/2019  . Nasal sinus congestion 03/08/2019  . Lung nodule 02/07/2019  . Death of wife 2019-02-07  . Esophageal stenosis 01/09/2017  . Shoulder pain, right 08/24/2016  . Memory change 01/03/2016  . Left ankle pain 11/05/2015  . Advance care planning 12/29/2013  . Allergic rhinitis 12/23/2012  . Medicare annual wellness visit, subsequent 12/22/2011  . History of melanoma 09/22/2011  . Knee pain 08/13/2011  . Paresthesia of foot 08/13/2011  . ETD (eustachian tube dysfunction) 05/01/2011  . ED (erectile dysfunction) 11/14/2010  . Schamberg's purpura 08/01/2010  . EXTERNAL HEMORRHOIDS 05/02/2010  . HYPERTROPHY PROSTATE W/UR OBST & OTH LUTS 10/09/2009  . ELEVATED BLOOD PRESSURE WITHOUT DIAGNOSIS OF HYPERTENSION 10/03/2008  . TRANSIENT ISCHEMIC ATTACK 08/24/2007  . COLONIC POLYPS, HX OF 08/24/2006  . DIVERTICULOSIS, COLON 09/21/2000  . HLD (hyperlipidemia) 05/05/1988    Past Surgical History:  Procedure Laterality Date  . Carotid U/S Nml  09/08/2007  . Colon polyp  2011   B9 Int Hemms  . Colonoscopy polyps  10/05/2003   Divertics in hemms  . COLONOSCOPY W/ POLYPECTOMY  08/1993; 09/21/2000;10/05/2003   B9  . Echo (other)  09/08/2007   nml lvf EF 50-55% Mild Diast Dysfctn Triv Ar  . melanoma scalp  07/2003  . SEPTOPLASTY  1995  . Skin revision vertex of scalp  2009   Dr. Luetta Nutting, [place]  Family History  Problem Relation Age of Onset  . Rectal cancer Father   . Colon cancer Paternal Aunt   . Other Paternal Aunt        cancer on back of neck  . Prostate cancer Neg Hx     Social History   Tobacco Use  . Smoking status: Former Smoker    Years: 5.00    Types: Cigarettes    Quit date: 05/05/1958    Years since quitting: 62.0  . Smokeless tobacco: Never Used  Vaping Use  . Vaping Use: Never used  Substance Use Topics  . Alcohol use: No  . Drug use: No    Home Medications Prior to Admission medications   Medication Sig Start Date End  Date Taking? Authorizing Provider  guaifenesin (ROBITUSSIN) 100 MG/5ML syrup Take 5 mLs (100 mg total) by mouth every 6 (six) hours as needed for cough or congestion. 05/17/20  Yes Kia Varnadore, PA-C  ascorbic acid (VITAMIN C) 500 MG tablet Take 1 tablet (500 mg total) by mouth daily. 05/15/20   Nita Sells, MD  aspirin 81 MG EC tablet Take 81 mg by mouth daily.    [provider]  Coenzyme Q10 (COQ-10) 100 MG CAPS Take 200 mg by mouth 3 (three) times daily.    [provider]  donepezil (ARICEPT) 10 MG tablet TAKE 1 TABLET BY MOUTH AT BEDTIME Patient taking differently: Take 10 mg by mouth at bedtime. Last dose unknown at this time. Filled at patients pharmacy 12-21. 02/13/20   Suzzanne Cloud, NP  lactase (LACTAID) 3000 units tablet Take by mouth daily.    [provider]  loratadine (CLARITIN) 10 MG tablet Take 10 mg by mouth daily.    [provider]  memantine (NAMENDA) 10 MG tablet Take 10 mg by mouth 2 (two) times daily. 04/21/20   [provider]  Multiple Vitamins-Minerals (MULTIVITAL) tablet Take 1 tablet by mouth daily.    [provider]  pantoprazole (PROTONIX) 40 MG tablet TAKE 1 TABLET BY MOUTH IN THE MORNING Patient taking differently: Take 40 mg by mouth daily. TAKE 1 TABLET BY MOUTH IN THE MORNING.  Last dose unknown- filled 11-21 05/31/19   Ladene Artist, MD  polyethylene glycol powder (MIRALAX) 17 GM/SCOOP powder Take 17 g by mouth daily. 05/11/19   Ria Bush, MD  potassium chloride SA (KLOR-CON) 20 MEQ tablet Take 2 tablets (40 mEq total) by mouth 2 (two) times daily. 05/15/20   Nita Sells, MD  pravastatin (PRAVACHOL) 40 MG tablet Take 1 tablet by mouth once daily 03/27/20   Tonia Ghent, MD  simethicone (MYLICON) 0000000 MG chewable tablet Chew 125 mg by mouth in the morning, at noon, and at bedtime.    [provider]  triamcinolone (NASACORT) 55 MCG/ACT AERO nasal inhaler Place 2 sprays  into the nose every other day. 05/17/20   Deneshia Zucker, PA-C  zinc sulfate 220 (50 Zn) MG capsule Take 1 capsule (220 mg total) by mouth daily. 05/15/20   Nita Sells, MD    Allergies    Flonase [fluticasone propionate] and Tamsulosin  Review of Systems   Review of Systems  HENT: Positive for congestion, postnasal drip and sinus pressure.   Respiratory: Positive for cough.   All other systems reviewed and are negative.   Physical Exam Updated Vital Signs BP 124/61   Pulse 69   Temp 98.5 F (36.9 C)   Resp 15   Ht 5\' 7"  (1.702 m)  Wt 65.8 kg   SpO2 100%   BMI 22.71 kg/m   Physical Exam Vitals and nursing note reviewed.  Constitutional:      General: He is not in acute distress.    Appearance: He is well-developed and well-nourished.     Comments: Elderly male who appears nontoxic  HENT:     Head: Normocephalic and atraumatic.     Right Ear: Ear canal and external ear normal. A middle ear effusion is present.     Left Ear: Ear canal and external ear normal. Tympanic membrane is retracted.     Ears:     Comments: Fluid behind R TM, no erythema or signs of AOM. L TM retracted.     Nose: Congestion present.     Mouth/Throat:     Mouth: Mucous membranes are moist.     Pharynx: Oropharynx is clear. Uvula midline. No oropharyngeal exudate or posterior oropharyngeal erythema.     Tonsils: No tonsillar exudate or tonsillar abscesses.     Comments: MM moist Eyes:     Extraocular Movements: Extraocular movements intact and EOM normal.     Conjunctiva/sclera: Conjunctivae normal.     Pupils: Pupils are equal, round, and reactive to light.  Cardiovascular:     Rate and Rhythm: Normal rate and regular rhythm.     Pulses: Normal pulses and intact distal pulses.  Pulmonary:     Effort: Pulmonary effort is normal. No respiratory distress.     Breath sounds: Normal breath sounds. No wheezing.     Comments: Clear lung sounds. Pt forcing a cough frequently  Abdominal:      General: There is no distension.     Palpations: Abdomen is soft. There is no mass.     Tenderness: There is no abdominal tenderness. There is no guarding or rebound.  Musculoskeletal:        General: Normal range of motion.     Cervical back: Normal range of motion and neck supple.  Skin:    General: Skin is warm and dry.     Capillary Refill: Capillary refill takes less than 2 seconds.  Neurological:     Mental Status: He is alert and oriented to person, place, and time.  Psychiatric:        Mood and Affect: Mood and affect normal.     ED Results / Procedures / Treatments   Labs (all labs ordered are listed, but only abnormal results are displayed) Labs Reviewed  CBC WITH DIFFERENTIAL/PLATELET - Abnormal; Notable for the following components:      Result Value   WBC 3.8 (*)    RBC 3.86 (*)    Hemoglobin 12.4 (*)    HCT 35.8 (*)    Lymphs Abs 0.5 (*)    All other components within normal limits  BASIC METABOLIC PANEL - Abnormal; Notable for the following components:   CO2 21 (*)    BUN 24 (*)    Calcium 8.1 (*)    All other components within normal limits    EKG None  Radiology No results found.  Procedures Procedures (including critical care time)  Medications Ordered in ED Medications - No data to display  ED Course  I have reviewed the triage vital signs and the nursing notes.  Pertinent labs & imaging results that were available during my care of the patient were reviewed by me and considered in my medical decision making (see chart for details).    MDM Rules/Calculators/A&P  Patient presenting for evaluation of nasal and chest congestion in the setting of recent COVID infection.  On exam, patient appears nontoxic.  He is concerned that he feels very dehydrated, however clinically, he does not appear dehydrated.  MM moist and heart rate is normal.  As patient was recently admitted for hyponatremia, will check blood work to  assess sodium.  However favor sinus congestion and postnasal drip in the setting of recent COVID infection.  Labs interpreted by me, overall reassuring.  Sodium is normal.  Discussed with patient symptomatic treatment with nasal spray and cough medicine, and using a humidifier at home.  As he has home health and palliative care, I feel he has appropriate assistance at home.  However patient does not feel he is getting adequate care at home.  He states he does not feel well, and as such he is thinking about going home and shooting himself in the head with his gun.  He told this to the nurse as well.  Discussed patient's condition and SI thoughts with patient's sister, Jeffrey [name].  She confirms patient does have a gun in the house, there is no one available to remove it from his house from discharge.  She does not feel patient is suicidal, states she checks on him every day.  As patient is reporting SI, and has a history of dementia and may be lacks foresight, will consult with TTS due to my concerns for his safety at home by himself. Case discussed with attending, Dr. Rogene Houston evaluated the pt.   Final Clinical Impression(s) / ED Diagnoses Final diagnoses:  COVID-19  Cough  Nasal congestion    Rx / DC Orders ED Discharge Orders         Ordered    triamcinolone (NASACORT) 55 MCG/ACT AERO nasal inhaler  Every other day        05/17/20 0839    guaifenesin (ROBITUSSIN) 100 MG/5ML syrup  Every 6 hours PRN        05/17/20 0839           Franchot Heidelberg, PA-C 05/17/20 1610    Fredia Sorrow, MD 05/18/20 1042

## 2020-05-17 NOTE — Discharge Instructions (Signed)
Your covid was positive, and this is a viral illness that needs to be treated symptomatically. Use Tylenol or ibuprofen as needed for fevers or body aches. Use nasacort daily for nasal congestion and cough. Take cough syrup for cough and chests congestion.  Make sure you stay well-hydrated with water. I recommend you get a humidifier for your bedroom, I think this will help with the dryness that you are feeling. Wash your hands frequently to prevent spread of infection. Follow-up with your primary care doctor in 1 week if your symptoms are not improving. Return to the emergency room if you develop chest pain, difficulty breathing, or any new or worsening symptoms.

## 2020-05-17 NOTE — BH Assessment (Signed)
TTS was informed by IT, that Tele health machine needs to be unplugged and restarted. Thanks.

## 2020-05-17 NOTE — ED Triage Notes (Signed)
BIBA Per EMS:  Pt coming from home with complaints of congestion and dry lips and mouth.  Slight confusion at baseline  Hx dementia  Vitals:  120palp 70HR 18RR 97% RA

## 2020-05-17 NOTE — BH Assessment (Signed)
Clinician attempted to call TTS cart and received an error message; called ended could not reach cart.   Clinician to check back, to ensure cart was unplugged and restarted.    Vertell Novak, [place], [facility], [facility] Triage Specialist [phone]

## 2020-05-18 DIAGNOSIS — U071 COVID-19: Secondary | ICD-10-CM | POA: Diagnosis not present

## 2020-05-18 NOTE — ED Notes (Signed)
TTS machine at bedside. 

## 2020-05-18 NOTE — BH Assessment (Signed)
TTS spoke with pt's sister Lyn[place] Citizen [phone]. Pt's sister reported that he is not SI, HI, A/VH.  She reported that he has a gun, also have owned it for six years, including a permit.  Pt sister reports that he has been sad due to the death of his wife in 2018-12-18.  His sister indicated that she does not live in [place] and he lives a lone.

## 2020-05-18 NOTE — Clinical Social Work Note (Addendum)
CSW contacted that pt is in need of transportation home as pt is ready for d/c. CSW discussed with supervisor that pt is psych/medically cleared, supervisor confirmed CSW is to call transport home for pt. CSW made call to [employer]. [facility] states they are awaiting to speak with managers to confirm driver can work overtime to transport pt due to only  one COVID transporter. CSW to be updated on if transport can take pt. CSW updated [facility] that at baseline pt is somewhat confused though he can state his address and has his home keys per information from RN. CSW attempted call to pts sister x5 to update on discharge home, all calls unsuccessful.   CSW updated by RN Cleotis Lema that transport has confirmed they will pick pt up from ED. TOC signing off.

## 2020-05-18 NOTE — BH Assessment (Signed)
TTS Counselor spoke to [place], [place], to complete TTS assessment.  She reported the nurse is assisting another pt, will call later.

## 2020-05-18 NOTE — ED Notes (Signed)
This nurse has attempted to call pts sister 3x with no answer. Will continue to contact sister about this pt.

## 2020-05-18 NOTE — Progress Notes (Signed)
Manufacturing engineer [facility])  Mr. Zoll is our current palliative patient in the community.  [facility] will follow while hospitalized.  Please reach out if we can be of assistance.  Venia Carbon RN, BSN, [facility] Liaison  [phone]

## 2020-05-18 NOTE — ED Provider Notes (Signed)
Delay on sending patient home because he did not want to go home and then he threatened suicidal ideation.  Patient has been evaluated by behavioral health and cleared by them.  They did contact the sheriff to see if guns could be removed from the house that was not doable.  But patient is denying suicidal ideation now and he wants to go home.  So we will discharge home.  Patient has been stable here in the emergency department.   Fredia Sorrow, MD 05/18/20 1445

## 2020-05-18 NOTE — BH Assessment (Addendum)
[place] Assessment Progress Note  Per Letitia Libra, NP, this voluntary pt does not require psychiatric hospitalization at this time.  Pt is psychiatrically cleared.  Behavioral Health referrals are not indicated for this pt at this time.  Pt reportedly has numerous firearms in the household and this Probation officer was asked to contact law enforcement to recommend that they be removed.  Pt lists an address in [place], [place].  At 10:24 I called [employer] and was routed to the [employer].  Call rolled to voice mail for Sgt. Laurance Flatten, and at 10:27 I left a message requesting return call, which is pending as of this writing.  EDP Fredia Sorrow, MD and pt's nurse, Cleotis Lema, have been notified.  [name], MA Triage Specialist [phone]  Addendum:  At 10:54 Sgt. Moore calls back and I spoke to her about firearms concern.  I also gave her contact information for pt's son/POA, Tim Hochstetler.  She agrees to investigate and call me back, but reports limitations to what they are able to do.  [name], [place] [phone]  Addendum:  At 13:01 Sgt. Moore called back. She reports that they cannot do anything about guns in pt's household, but he has applied for a concealed carry permit.  These facts will weigh into their decision about whether to grant it.  They will be in touch with the son who will have standing IF he goes forward with pursuing guardianship. This Probation officer looked at [employer], found in pt's EPIC record under the Media tab.  It does NOT name the son, as [place], but rather [employer] (?) and pt's sister, Carren Rang.  These facts have been reported to Dr Rogene Houston, Cleotis Lema, [place] Tarpley-Carter with TOC, and to Letitia Libra, NP.  [name], [place] Coordinator [phone]
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
## 2020-05-18 NOTE — BH Assessment (Signed)
TTS Counselor received a called from Jeffrey [name] at 6:05a , if pt able to engage in assessment.  Per RN patient is in restroom, will be ready in fifteen minutes.  Clinician apptemted to call the cart, still not working.  Pt will be seen by day staff.

## 2020-05-18 NOTE — BH Assessment (Signed)
TTS spoke with Sherlyn Lick, Telehealth machine was unplugged, it is working now.

## 2020-05-22 ENCOUNTER — Telehealth: Payer: Self-pay

## 2020-05-22 ENCOUNTER — Other Ambulatory Visit: Payer: Medicare Other

## 2020-05-22 ENCOUNTER — Other Ambulatory Visit: Payer: Self-pay

## 2020-05-22 VITALS — BP 98/56 | HR 79 | Temp 99.8°F | Resp 20 | Wt 124.8 lb

## 2020-05-22 DIAGNOSIS — Z515 Encounter for palliative care: Secondary | ICD-10-CM

## 2020-05-22 NOTE — Telephone Encounter (Signed)
Vance Gather, RN, Palliative nurse with [facility] sent secure msg reporting Hi [place]! I am doing a home visit with Jeffrey [name]. He is still not doing great. Looks like he is living off of water, oatmeal cookies and crackers. He is complaining of nausea. Any chance we can get him something to help with this? I am hoping if we can address this maybe he will start eating and drinking a little better. I have also reached out to [facility] again to give an update. He really should not be home alone but he does not want to leave. I have also reached out to [facility] and they are going to try and get him on services this week. Thanks!    Advised a msg would be sent to PCP.

## 2020-05-22 NOTE — Telephone Encounter (Signed)
I don't think a med change will address his situation.  It doesn't sound like he will add on another med to treat his delusions.  He has been resistant to acknowledging his delusions in the past.  This sounds like he is going to need placement and APS is likely the best intervention at this point. Please check with APS.  Thanks.

## 2020-05-22 NOTE — Progress Notes (Signed)
PATIENT NAME: Jeffrey Jeffrey [name] DOB: 08/23/1928 MRN: [id number]  PRIMARY CARE PROVIDER: Duncan, Jeffrey S, MD  RESPONSIBLE PARTY:  Acct ID - Guarantor Home Phone Work Phone Relationship Acct Type  [id number] - Jeffrey Jeffrey [name]* [phone]  Self P/F     [address], [place], [place] [address]    PLAN OF CARE and INTERVENTIONS:               1.  GOALS OF CARE/ ADVANCE CARE PLANNING:  Remain home and independent for as long as possible               2.  PATIENT/CAREGIVER EDUCATION:  Dehydration and safety               4. PERSONAL EMERGENCY PLAN:  Activate 911 for emergencies.               5.  DISEASE STATUS:  Attempted to reach patient by phone several times today but not successful.  Visit made to home and patient answers the door and agrees to a visit.    Patient'Jeffrey [name] is unsteady and he occasionally holds onto furniture/wall to steady himself.  He states he is not doing well.  Patient is very fixated on his lips being chapped and dry, not being able to care for himself as he previously did, unmade bed and unwashed clothes.  I have provided him with a new tube a Vaseline for his lips.  I have discussed ALF with patient but he does not wish to leave his home.  It does not appear that patient is eating or drinking well.  Patient reports eating crackers, oatmeal cookies and water.  Molded muffins are on the counter and untouched food in the fridge.  He has canned goods in the cabinet but does not cook.  I have offered to fix food for him but patient declines due to nausea. Gatorade is provided to patient and he is drinking sips of this. I have reached out to PCP office to see if something can be ordered to address nausea. Response pending.  Patient states he has not left his home since returning from the hospital.  Patient has made contact with his son but he is uncertain if his son will be able to help assist him.  I have contacted Mr. Jeffrey Jeffrey [name] with APS to provide an update on patient'Jeffrey [name]  condition.  Message has been left requesting a call back.  I have contacted [facility] to follow up on the referral that was placed while patient was hospitalized.  I spoke with [name] who states the last update they received was patient was back in the hospital.  I have advised that patient was discharged from [facility] on 1/14 and is back home.  [name] will send referral over and work on getting patient established this week.  I have attempted to reach patient'Jeffrey [name] but unsuccessful and unable to leave a message.  Sister called back during this visit and I have provided an update on her brother'Jeffrey [name].  Sister is not able to provide any assistance due to her distance from patient.   HISTORY OF PRESENT ILLNESS:  87 year old male with memory loss.  Patient is being followed by Palliative Care monthly and PRN.  CODE STATUS: Full ADVANCED DIRECTIVES: Yes MOST FORM: No PPS: 50%   PHYSICAL EXAM:   VITALS:Temp 99.8 LUNGS:  CTA + for coughing, clear sputum CARDIAC: HRR EXTREMITIES: No edema SKIN: warm and dry to touch. NEURO: alert and   oriented x 3 + forgetfulness       Jeffrey Burton, RN

## 2020-05-22 NOTE — Telephone Encounter (Signed)
[name] left phone number as [phone].  Contacted [name] and advised of PCP msg. She reports pt is still not eating well and is most likely dehydrated again. She reports he is hypotensive and has left a msg with [facility]PS but has not received a msg back yet. She reports pt said he has contacted his son but his son has not come to help in anyway. She has also talked to him but he has not come to the house to her knowledge. She is awaiting APS call and will let this office know of the conversation when they return the call. She said she also talked to the pt's sister but she is also not able to help the pt due to her own conditions. [name] reports palliative care has not started to see pt yet but she has talked to them and they should start soon. But she does not think this will be adequate care for the pt. Advised if there is anything needed from this office please call or send a msg. [name] verbalized understanding.

## 2020-05-23 ENCOUNTER — Telehealth: Payer: Self-pay

## 2020-05-23 NOTE — Telephone Encounter (Signed)
Received message that patient called to speak to primary Palliative nurse. Vance Gather updated.

## 2020-05-23 NOTE — Telephone Encounter (Signed)
Agreed.  Thanks.  

## 2020-05-23 NOTE — Telephone Encounter (Signed)
The msg below was just sent form the Palliative care nurse, Vance Gather. I have sent her a msg back to keep [place] up to date and let us know if there is anything needed from [place].    Hello! Just a quick follow up on Jeffrey [name]. I spoke with [name] at [facility] this morning. He has been updated on the most recent ED visit and my visit yesterday. [name] is going to speak with the son again, reach out to [place] and try to do a home visit today with the patient. He is hopeful if firearms can be removed then the son will get back involved. There was also discussion of having a [facility] possibly come in to provide personal care assistance. I will keep you posted on any other updates. Thanks!

## 2020-05-23 NOTE — Telephone Encounter (Signed)
The only other thing I can think have to do is to recommend emergency room evaluation, if he had significant deterioration such as clinical dehydration in the meantime.  I do not think an office visit here or sending in a prescription now would change his situation on the home front or increase his compliance with treatment.

## 2020-05-24 ENCOUNTER — Telehealth: Payer: Self-pay

## 2020-05-24 NOTE — Telephone Encounter (Signed)
1029 am.  Follow up phone call made to patient to check on overall status.  Patient states he is feeling some better today.  He says he ate more this morning for breakfast.  He is asking for more assistance in the home but conversation continues to ramble and it is difficult to make recommendations to patient.  I have discussed private caregivers coming into the home to provide assistance with light housekeeping, cooking and grocery shopping.  I will have PC SW reach out to patient to further discuss this and assist with starting services should patient agree.  Message left for Gisela, SW regarding above.
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
## 2020-05-24 NOTE — Telephone Encounter (Signed)
Volunteer called patient on behalf of Palliative Care and did not get a answer from patient/family. ° °

## 2020-05-24 NOTE — Telephone Encounter (Signed)
(  2:50pm) SW completed a telephone call with patient to discuss additional in-home care to assist him. He was quite confused and unable to fully engage in a conversation with SW. He repeated himself, his verbalizations were scattered. Patient was not able to give me his address or county he lives in.  However patient repeated stated "I need help".  SW will refer to his son for further assistance. APS report made due to safety concerns and progressive cognitive deficits. SW attempted to call his sister for additional support.  * (3:09p)SW left a message with APS worker-Mr. Green, requesting a call back.

## 2020-05-25 ENCOUNTER — Telehealth: Payer: Self-pay

## 2020-05-25 NOTE — Telephone Encounter (Signed)
(  4:57p) Palliative care SW spoke to patient's son [phone]) regarding concerns of patient's cognitive decline and safety on yesterday. SW followed up with a call today to advise son that she had not heard back from [place] caseworker- Mr. Carlota Raspberry. SW advised him that she was at standstill and would have primary SW-A. Perry follow-up with him on Monday. SW asked patient's son to please check on patient to ensure he is safe and have food over the weekend as inclement weather is expected. Patient's son advised that he spoke to Mr. Carlota Raspberry (DSS) on Wednesday and he advised him that someone would be out to see patient from his office this week. However the son has not heard anything else from Mr. Carlota Raspberry since that time. SW provided reassurance of ongoing support by the palliative care team. And encouraged him to continue to work with all the entities involved to ensure his dad gets the help he needs. The son expressed appreciation for the call and follow-up and assured SW that he would check on patient over the weekend.   * Primary Palliative Care team will be updated for follow-up.

## 2020-05-26 DIAGNOSIS — N529 Male erectile dysfunction, unspecified: Secondary | ICD-10-CM | POA: Diagnosis not present

## 2020-05-26 DIAGNOSIS — U071 COVID-19: Secondary | ICD-10-CM | POA: Diagnosis not present

## 2020-05-26 DIAGNOSIS — N400 Enlarged prostate without lower urinary tract symptoms: Secondary | ICD-10-CM | POA: Diagnosis not present

## 2020-05-26 DIAGNOSIS — Z9181 History of falling: Secondary | ICD-10-CM | POA: Diagnosis not present

## 2020-05-26 DIAGNOSIS — K449 Diaphragmatic hernia without obstruction or gangrene: Secondary | ICD-10-CM | POA: Diagnosis not present

## 2020-05-26 DIAGNOSIS — F0390 Unspecified dementia without behavioral disturbance: Secondary | ICD-10-CM | POA: Diagnosis not present

## 2020-05-26 DIAGNOSIS — E860 Dehydration: Secondary | ICD-10-CM | POA: Diagnosis not present

## 2020-05-26 DIAGNOSIS — Z85820 Personal history of malignant melanoma of skin: Secondary | ICD-10-CM | POA: Diagnosis not present

## 2020-05-26 DIAGNOSIS — J309 Allergic rhinitis, unspecified: Secondary | ICD-10-CM | POA: Diagnosis not present

## 2020-05-26 DIAGNOSIS — E871 Hypo-osmolality and hyponatremia: Secondary | ICD-10-CM | POA: Diagnosis not present

## 2020-05-26 DIAGNOSIS — Z7982 Long term (current) use of aspirin: Secondary | ICD-10-CM | POA: Diagnosis not present

## 2020-05-26 DIAGNOSIS — L723 Sebaceous cyst: Secondary | ICD-10-CM | POA: Diagnosis not present

## 2020-05-26 DIAGNOSIS — N179 Acute kidney failure, unspecified: Secondary | ICD-10-CM | POA: Diagnosis not present

## 2020-05-26 DIAGNOSIS — E785 Hyperlipidemia, unspecified: Secondary | ICD-10-CM | POA: Diagnosis not present

## 2020-05-26 DIAGNOSIS — K5790 Diverticulosis of intestine, part unspecified, without perforation or abscess without bleeding: Secondary | ICD-10-CM | POA: Diagnosis not present

## 2020-05-26 DIAGNOSIS — Z87891 Personal history of nicotine dependence: Secondary | ICD-10-CM | POA: Diagnosis not present

## 2020-05-26 DIAGNOSIS — Z86010 Personal history of colonic polyps: Secondary | ICD-10-CM | POA: Diagnosis not present

## 2020-05-26 DIAGNOSIS — Z8673 Personal history of transient ischemic attack (TIA), and cerebral infarction without residual deficits: Secondary | ICD-10-CM | POA: Diagnosis not present

## 2020-05-28 ENCOUNTER — Telehealth: Payer: Self-pay

## 2020-05-28 DIAGNOSIS — F0390 Unspecified dementia without behavioral disturbance: Secondary | ICD-10-CM | POA: Diagnosis not present

## 2020-05-28 DIAGNOSIS — U071 COVID-19: Secondary | ICD-10-CM | POA: Diagnosis not present

## 2020-05-28 DIAGNOSIS — N400 Enlarged prostate without lower urinary tract symptoms: Secondary | ICD-10-CM | POA: Diagnosis not present

## 2020-05-28 DIAGNOSIS — E860 Dehydration: Secondary | ICD-10-CM | POA: Diagnosis not present

## 2020-05-28 DIAGNOSIS — N179 Acute kidney failure, unspecified: Secondary | ICD-10-CM | POA: Diagnosis not present

## 2020-05-28 DIAGNOSIS — E871 Hypo-osmolality and hyponatremia: Secondary | ICD-10-CM | POA: Diagnosis not present

## 2020-05-28 NOTE — Telephone Encounter (Signed)
It looks like he needs OV first.  We can't get HH to help with an aide at home.  And have him/his family contact Jeffrey [name] at [facility] to see about getting extra help in the home.  Mrs. Marvel Plan has already checking on the patient but my understanding was that he had not accepted support prev.

## 2020-05-28 NOTE — Telephone Encounter (Signed)
4PM: Palliative care SW outreached patient son, Jeffrey [name], to follow up on patient well-being.  Son shared that he had just spoke with patient and nearly a 2 hour long conversation. During conversation patient shared with son that he needed son to assistance with his mail and bills because he can no longer do it.  Son shared that he realizes patients cognition has declined and that he was surprised that patient requested his assistance, do to their past relationship and patient being suspicious of him and paranoid that the son was trying to "take advantage" of him. Son shared that he had HCPOA and financial POA of patient financial POA, per son he is unsure if patient has revoked this. Son shared that he would like to pursue guardianship for patient and is aware that guardianship is recommended by patient's medical provider.   SW discussed with son patients current caregiver needs and patient constantly outreaching palliative care to come "provide assistance". Son shared that he is aware that patient requires assistance in the home or ALF. SW advised son that patient is aware that he needs assistance in the home but not be open to ALF at this time, but that these are really patient's only two options for consistent assistance outside of palliative care. Son stated agreement. SW informed son that SW can make referral to a private care agency but that son would need to be point of contact for them discuss needs, LOS and fee for service. Son shared that he would be in town later this week (Thu or Fri), SW suggested a home care agency be set up to do an assessment during that time frame while son is present.   Son shared that he has spoken with Jeffrey [name], of [facility], twice and was told that someone would be coming to do an in home assessment last week, but son is unsure if this happened. SW outreached Jeffrey [name] at [phone], to follow up. Jeffrey [name] can also possibly assist son with guardianship process.  Son was  very appreciative of call and is aware that he can outreach with any further questions/concerns. Palliative care will continue to follow.

## 2020-05-28 NOTE — Progress Notes (Signed)
4:30PM: Palliative care SW outreached Jeffrey [name] with [facility] at [phone], to discuss patients needs.  SW updated Jeffrey [name] on patients current situation. Jeffrey [name] suggested the following resources: [facility]  SW advised that may not eat the [facility]'s food due to him having a routine of going to [employer] every morning and having certain foods that he prefer. Patient is also not a socialist where he would enjoy or engage in a senior center or adult day center. Patient has declined [facility] due to not wanting to change PCP's. SW advised Jeffrey [name] that patient is in need of in home care assistance. She shared that if patient does not have Medicaid or ca not qualify for Medicaid benefits then the services would be private pay. SW stated understanding and that patient would not qualify for Medicaid. Jeffrey [name] sent a list of private duty agencies to SW. SW will review and outreach to place referral and provide son as POC.  SW provide Jeffrey [name] with contact info incas she has any further resources to share for patient.

## 2020-05-28 NOTE — Telephone Encounter (Signed)
Pt called triage asking if there is a way to skilled nursing to come in so he could get a nurse aide to come help in his home several days a week. Asking if Dr Damita Dunnings had any suggestions. Says he used to have people come out but they don't anymore.  Also, he has dry mouth and dry lips he thinks from his medications. Did Dr Damita Dunnings have a suggestion.

## 2020-05-28 NOTE — Telephone Encounter (Signed)
3:45PM: Palliative care SW outreached patient to returning his call. Call unsuccessful. SW LVM.

## 2020-05-29 ENCOUNTER — Telehealth: Payer: Self-pay

## 2020-05-29 ENCOUNTER — Other Ambulatory Visit: Payer: Medicare Other

## 2020-05-29 ENCOUNTER — Ambulatory Visit: Payer: Medicare Other | Admitting: Family Medicine

## 2020-05-29 ENCOUNTER — Other Ambulatory Visit: Payer: Self-pay

## 2020-05-29 VITALS — BP 108/60 | HR 71 | Temp 98.6°F | Resp 18

## 2020-05-29 DIAGNOSIS — E871 Hypo-osmolality and hyponatremia: Secondary | ICD-10-CM | POA: Diagnosis not present

## 2020-05-29 DIAGNOSIS — E860 Dehydration: Secondary | ICD-10-CM | POA: Diagnosis not present

## 2020-05-29 DIAGNOSIS — N179 Acute kidney failure, unspecified: Secondary | ICD-10-CM | POA: Diagnosis not present

## 2020-05-29 DIAGNOSIS — Z515 Encounter for palliative care: Secondary | ICD-10-CM

## 2020-05-29 DIAGNOSIS — F0390 Unspecified dementia without behavioral disturbance: Secondary | ICD-10-CM | POA: Diagnosis not present

## 2020-05-29 DIAGNOSIS — U071 COVID-19: Secondary | ICD-10-CM | POA: Diagnosis not present

## 2020-05-29 DIAGNOSIS — N400 Enlarged prostate without lower urinary tract symptoms: Secondary | ICD-10-CM | POA: Diagnosis not present

## 2020-05-29 NOTE — Telephone Encounter (Signed)
Received secure chat msg from palliative care nurse, Vance Gather, RN. It has been copied and pasted below.  Hello! Just wanted to give you an update on [name]. He has improved and is eating/drinking again. He has regained his strength and is slowly getting back into his routine. [facility] PT/MSW/RN are also seeing patient. His son is also planning on coming at the end of the week to assist however he can. Hope you have a great day!

## 2020-05-29 NOTE — Telephone Encounter (Signed)
1040AM: Palliative care SW outreached patient to check on well-being  Call unsuccessful. SW LVM.

## 2020-05-29 NOTE — Progress Notes (Signed)
PATIENT NAME: Jeffrey [name] DOB: 1928/08/12 MRN: [id number]  PRIMARY CARE PROVIDER: Tonia Ghent, MD  RESPONSIBLE PARTY:  Acct ID - Guarantor Home Phone Work Phone Relationship Acct Type  [id number] Jeffrey [name], COLPITTS808 [phone]  Self P/F     [address], [place], [place] [address]    PLAN OF CARE and INTERVENTIONS:               1.  GOALS OF CARE/ ADVANCE CARE PLANNING:  Remain home and independent for as long as possible.               2.  PATIENT/CAREGIVER EDUCATION:  Safety and exercises.               4. PERSONAL EMERGENCY PLAN:  Activate 911 for emergencies.               5.  DISEASE STATUS:  Joint visit completed with [place], SW.  Patient greeted [place] at the door.  States he is feeling stronger.  Patient went to [employer] this morning and got groceries.  He showed [place] the items he purchased.  He states his appetite is picking up and he feels he is eating better.  Patient believes he has lost 10-15 lbs this month.    Patient now has [facility] PT, RN and MSW involved in his care. Notebook is found on patient's sofa.  He does not recall who all is coming into the home but he is pleased to have people coming in to check on him.  He is unable to recall if he has started physical therapy but recalls someone coming in to check his temperature.  Patient states his son is suppose to come in at the end of this week.  He is hopeful his son will be able to assist him.  Patient is focused on getting stronger and getting his finances back on track.  Acknowledges he is needing help in order to accomplish this.  Patient discusses the current news that he watches on  [place].  At times, he gets fixated on topics and it is difficult to redirect.  Patient's coughing has improved since my last visit.  He denies any issues with nausea/vomitting.  No dizziness reported.  Patient states he is sleeping well during the night.  Education offered on continuing with strengthening  exercises that were left by PT.     HISTORY OF PRESENT ILLNESS:  87 year old male with hx of TIA.  Patient is being followed by Palliative Care monthly and PRN.  CODE STATUS: Full ADVANCED DIRECTIVES: Yes MOST FORM: No PPS: 60%   PHYSICAL EXAM:   VITALS:Temp 98.6 F BP 108/60, P 71, R 18, O2 saturation 99% on RA. LUNGS: CTA no shortness of breath noted. CARDIAC: HRR EXTREMITIES: No edema SKIN: Warm and dry to touch NEURO: alert and oriented x 3.       Lorenza Burton, RN

## 2020-05-29 NOTE — Progress Notes (Signed)
COMMUNITY PALLIATIVE CARE SW NOTE  PATIENT NAME: Jeffrey [name] DOB: 03/22/1929 MRN: [id number]  PRIMARY CARE PROVIDER: Tonia Ghent, MD  RESPONSIBLE PARTY:  Acct ID - Guarantor Home Phone Work Phone Relationship Acct Type  [id number] TYREEK, [phone]  Self P/F     [address], [place], [place] [address]     PLAN OF CARE and INTERVENTIONS:             1. GOALS OF CARE/ ADVANCE CARE PLANNING:  Patient is a Full code. Code status discussed in detail with patient. Patients sister has HCPOA. Patient's goal is to remain in the home as independent.  2.         SOCIAL/EMOTIONAL/SPIRITUAL ASSESSMENT/ INTERVENTIONS:  SW and RN [name] met with patient in patients home for a wellness check. Patient appeared to be doing well. He was well groomed and ambulating without assistance. Patient shared that he had just gotten back in from the grocery store, patient drove his POV. Patient had purchased items that he enjoys eating to include bananas, some frozen meals, and muffins.   Patient shared that he has been feeling a lot better and eating more. Patient shared that he had some laundry this past Sunday and is getting energy back to do some other things around the home.   RN reviewed medications and checked vitals. Patient has apparent weight loss from his recent COVID dx and lack of appetite. Patient currently has [facility] (PT, RN and SW). No falls. Patient could benefit from medication management assistance.   Patient has apparent confusion and repeats himself during visit. Patient acknowledges his cognitive deficits. Patient shared that he has spoken to his son and that his son will be visiting later this week. Patient voices that he still has some suspicion of his sons intentions but he is willing to accept the assistance from his son as he knows he needs it.   SW discussed goals and reviewed care plan. Patient states that he is willing to have assistance in the home but only if  insurance will pay for it. Palliative care will continue to monitor and assist with long term care planning as needed.   3.         PATIENT/CAREGIVER EDUCATION/ COPING:  Patient A&O, when asked current day he had to refer back to his watch. Patient has cognitive/memory deficits which at times makes it difficult for him to stay on topic. Patient is widowed. Patient continues to be fixated on the idea that his lawyers and son have been stealing from him and taking advantage of some of his finances.   4.         PERSONAL EMERGENCY PLAN:  Patient will call 9-1-1 for emergencies.   5.         COMMUNITY RESOURCES COORDINATION/ HEALTH CARE NAVIGATION:  Patient does not have assistance managing health care.  6.         FINANCIAL/LEGAL CONCERNS/INTERVENTIONS:  None.     SOCIAL HX:  Social History   Tobacco Use  . Smoking status: Former Smoker    Years: 5.00    Types: Cigarettes    Quit date: 05/05/1958    Years since quitting: 62.1  . Smokeless tobacco: Never Used  Substance Use Topics  . Alcohol use: No    CODE STATUS: FULL CODE  ADVANCED DIRECTIVES: Y MOST FORM COMPLETE: N HOSPICE EDUCATION PROVIDED: N  PPS: Patient is ambulatory without AD. Independent with all ADL's.   Time spent:  45 min.       Doreene Eland, [place]

## 2020-05-30 NOTE — Telephone Encounter (Signed)
Spoke with patient about below. He said he will call back to make an appt for this.

## 2020-05-30 NOTE — Telephone Encounter (Signed)
Noted.  Thanks.  Glad to hear.  ? ?

## 2020-05-31 ENCOUNTER — Telehealth: Payer: Self-pay

## 2020-05-31 DIAGNOSIS — N400 Enlarged prostate without lower urinary tract symptoms: Secondary | ICD-10-CM | POA: Diagnosis not present

## 2020-05-31 DIAGNOSIS — E860 Dehydration: Secondary | ICD-10-CM | POA: Diagnosis not present

## 2020-05-31 DIAGNOSIS — N179 Acute kidney failure, unspecified: Secondary | ICD-10-CM | POA: Diagnosis not present

## 2020-05-31 DIAGNOSIS — F0390 Unspecified dementia without behavioral disturbance: Secondary | ICD-10-CM | POA: Diagnosis not present

## 2020-05-31 DIAGNOSIS — E871 Hypo-osmolality and hyponatremia: Secondary | ICD-10-CM | POA: Diagnosis not present

## 2020-05-31 DIAGNOSIS — U071 COVID-19: Secondary | ICD-10-CM | POA: Diagnosis not present

## 2020-05-31 NOTE — Telephone Encounter (Signed)
Diane nurse with [facility] left v/m that she has concerns about pts wt loss; pt was 144 lbs on 05/04/20 and today wt was 126 lbs fully clothed; for a while pt was not eating a lot when it snowed but now pt has soup at home. Diane does not think pt is safe at home alone. Pt will not let anyone stay with him; pt changed locks on his door so son could not get in. also the social worker is going to make another APS report. Diane wants cb, pt is confused and she does not think pt should be driving;Diane said pt drives daily. Diane just saw pt for the first time today and thinks the son has voiced concerns for pts driving safety as well to Dr Damita Dunnings . Diane wants to know if pt was seen at [facility] this wk. Diane suggested to pt to get boost or ensure and Diane wrote it down. Pt was not seen at [facility] this wk but was seen by Palliative care on 05/29/20. Diane will contact [facility] also. Diane request cb after Dr Damita Dunnings reviews note.

## 2020-06-01 ENCOUNTER — Telehealth: Payer: Self-pay

## 2020-06-01 NOTE — Telephone Encounter (Signed)
I can't revoke his license.  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  I had advised him not to drive prev at the [place] on 02/21/20.    We can offer OV here.  Think it makes sense to get APS involved.  Please make sure that is happening.  Thanks.

## 2020-06-01 NOTE — Telephone Encounter (Signed)
I think it makes sense for Diane to be in contact with palliative care since they saw the patient most recently.  Based on this, I think it makes sense for social work to make another APS report.  I thank all involved.

## 2020-06-01 NOTE — Telephone Encounter (Signed)
LVM for Jeffrey [name] again.

## 2020-06-01 NOTE — Telephone Encounter (Signed)
LVM

## 2020-06-01 NOTE — Telephone Encounter (Signed)
Message received from [place], [place] for [place].  Return call made at 34 am.  Jackelyn Poling is requesting further information on this patient as they are new to the case and have concerns about weight loss and memory issues.  I have provided an update on patient's baseline and how he has been since Palliative Care admitted him in 01/2020.  Updated on decline patient experienced after COVID dx with 3 ED visits this month, increase confusion, paranoia and delusional thinking.  Advised that my visit last week was very concerning as patient was not eating, hypotensive and weak. I had contacted ASP with an update.  At that time, C. [name] SW was going to contact patient's son, [place] regarding removal of weapons and possibly contact [place].  Patient contacted Palliative Care at the beginning of this week requesting follow up.  In home visit was completed and patient had improved.  He was completing light house work, eating had improved and he had picked up groceries from [place]. I expect he will either maintain the current weight or increase his weight since he is now eating.  Patient was feeling stronger and feeling better about having people coming in to check on him.  Patient was working on getting back to his normal routine prior to [place] dx.   Patient had also reached out to his son requesting help and son was planning on making a home visit this week.  Jackelyn Poling states she has spoken with Gennie Alma, SW today, he will likely make a home visit next week to follow up.  Advised if Palliative Care could assist in anyway to contact us.  Palliative Care will continue to see patient monthly and PRN.

## 2020-06-01 NOTE — Telephone Encounter (Signed)
Diane called again requesting that Dr Damita Dunnings see if he can have pt's license taken away also she is concerned that pt has lost 20 pounds since 05/05/2020... please advise

## 2020-06-04 DIAGNOSIS — E860 Dehydration: Secondary | ICD-10-CM | POA: Diagnosis not present

## 2020-06-04 DIAGNOSIS — N400 Enlarged prostate without lower urinary tract symptoms: Secondary | ICD-10-CM | POA: Diagnosis not present

## 2020-06-04 DIAGNOSIS — U071 COVID-19: Secondary | ICD-10-CM | POA: Diagnosis not present

## 2020-06-04 DIAGNOSIS — E871 Hypo-osmolality and hyponatremia: Secondary | ICD-10-CM | POA: Diagnosis not present

## 2020-06-04 DIAGNOSIS — F0390 Unspecified dementia without behavioral disturbance: Secondary | ICD-10-CM | POA: Diagnosis not present

## 2020-06-04 DIAGNOSIS — N179 Acute kidney failure, unspecified: Secondary | ICD-10-CM | POA: Diagnosis not present

## 2020-06-04 NOTE — Telephone Encounter (Signed)
Note from palliative care nurse to [facility], nurse with [facility] on 06/01/20.  Lorenza Burton, RN   JB  06/01/20 12:05 PM Note Message received from [place], [place] for [place].  Return call made at 28 am.  Jackelyn Poling is requesting further information on this patient as they are new to the case and have concerns about weight loss and memory issues.  I have provided an update on patient's baseline and how he has been since Palliative Care admitted him in 01/2020.  Updated on decline patient experienced after COVID dx with 3 ED visits this month, increase confusion, paranoia and delusional thinking.  Advised that my visit last week was very concerning as patient was not eating, hypotensive and weak. I had contacted ASP with an update.  At that time, C. [name] SW was going to contact patient's son, [place] regarding removal of weapons and possibly contact [place].  Patient contacted Palliative Care at the beginning of this week requesting follow up.  In home visit was completed and patient had improved.  He was completing light house work, eating had improved and he had picked up groceries from [place]. I expect he will either maintain the current weight or increase his weight since he is now eating.  Patient was feeling stronger and feeling better about having people coming in to check on him.  Patient was working on getting back to his normal routine prior to [place] dx.   Patient had also reached out to his son requesting help and son was planning on making a home visit this week.  Jackelyn Poling states she has spoken with Gennie Alma, SW today, he will likely make a home visit next week to follow up.  Advised if Palliative Care could assist in anyway to contact us.  Palliative Care will continue to see patient monthly and PRN.      Lorenza Burton, RN to [employer], SW   JB   06/01/20 11:47 AM

## 2020-06-05 DIAGNOSIS — E860 Dehydration: Secondary | ICD-10-CM | POA: Diagnosis not present

## 2020-06-05 DIAGNOSIS — F0390 Unspecified dementia without behavioral disturbance: Secondary | ICD-10-CM | POA: Diagnosis not present

## 2020-06-05 DIAGNOSIS — U071 COVID-19: Secondary | ICD-10-CM | POA: Diagnosis not present

## 2020-06-05 DIAGNOSIS — N179 Acute kidney failure, unspecified: Secondary | ICD-10-CM | POA: Diagnosis not present

## 2020-06-05 DIAGNOSIS — E871 Hypo-osmolality and hyponatremia: Secondary | ICD-10-CM | POA: Diagnosis not present

## 2020-06-05 DIAGNOSIS — N400 Enlarged prostate without lower urinary tract symptoms: Secondary | ICD-10-CM | POA: Diagnosis not present

## 2020-06-05 NOTE — Telephone Encounter (Signed)
Noted. Thanks.

## 2020-06-08 ENCOUNTER — Telehealth: Payer: Self-pay

## 2020-06-08 DIAGNOSIS — E871 Hypo-osmolality and hyponatremia: Secondary | ICD-10-CM | POA: Diagnosis not present

## 2020-06-08 DIAGNOSIS — N179 Acute kidney failure, unspecified: Secondary | ICD-10-CM | POA: Diagnosis not present

## 2020-06-08 DIAGNOSIS — R413 Other amnesia: Secondary | ICD-10-CM

## 2020-06-08 DIAGNOSIS — E860 Dehydration: Secondary | ICD-10-CM | POA: Diagnosis not present

## 2020-06-08 DIAGNOSIS — U071 COVID-19: Secondary | ICD-10-CM | POA: Diagnosis not present

## 2020-06-08 DIAGNOSIS — F0390 Unspecified dementia without behavioral disturbance: Secondary | ICD-10-CM | POA: Diagnosis not present

## 2020-06-08 DIAGNOSIS — N400 Enlarged prostate without lower urinary tract symptoms: Secondary | ICD-10-CM | POA: Diagnosis not present

## 2020-06-08 NOTE — Progress Notes (Signed)
10:20AM: Palliative care SW [facility] APS program manager Kevin Fenton [phone], to inquire if APS has conducted their home visit with patient, as APS worker Mr. Carlota Raspberry has not returned SW TC.  VM left for Mr. Truman Hayward. Awaiting return call.

## 2020-06-08 NOTE — Telephone Encounter (Signed)
Jeffrey [name] Social Worker with [name] called back to say she went out to see the pt. She has spoken to [facility] and Palliative Nurse [name], and Pt son. All agree the son needs guardianship because the pt is not capable of making decisions. However, there is no documentation from a physician stating this so nothing can be done in court. The hospital cleared him to care for himself. Asking if Dr Damita Dunnings knows of a psychiatrist who may be able to do an evaluation of him for them. Please call Hilda Blades at [phone].

## 2020-06-10 NOTE — Telephone Encounter (Signed)
I do not know who he would be able to see.  I put in the referral in the meantime.  Thanks.

## 2020-06-11 ENCOUNTER — Telehealth: Payer: Self-pay

## 2020-06-11 NOTE — Telephone Encounter (Signed)
910 am.  Phone call made to patient to schedule an in-person visit.  No answer but I have left a HIPPA compliant message on his VM.  PLAN: Awaiting call back.  If no callback, I will reach out again this month.

## 2020-06-12 ENCOUNTER — Telehealth: Payer: Self-pay | Admitting: *Deleted

## 2020-06-12 NOTE — Telephone Encounter (Signed)
Debbie Education officer, museum with [facility] left a voicemail stating she has concerns about the patient being so confused. Debbie stated when she called the patient last night to set up a social worker visit he was so confused. Patient told her he went to [facility] to pick [facility] and was really confused about his medicine. Debbie stated that she gad reached out to the APS Education officer, museum. Jackelyn Poling stated that she can't do anything to get the patient placed. Jackelyn Poling stated that she wanted Dr. Damita Dunnings to be aware of everything that is going on with the patient.

## 2020-06-13 DIAGNOSIS — E860 Dehydration: Secondary | ICD-10-CM | POA: Diagnosis not present

## 2020-06-13 DIAGNOSIS — F0390 Unspecified dementia without behavioral disturbance: Secondary | ICD-10-CM | POA: Diagnosis not present

## 2020-06-13 DIAGNOSIS — E871 Hypo-osmolality and hyponatremia: Secondary | ICD-10-CM | POA: Diagnosis not present

## 2020-06-13 DIAGNOSIS — U071 COVID-19: Secondary | ICD-10-CM | POA: Diagnosis not present

## 2020-06-13 DIAGNOSIS — N400 Enlarged prostate without lower urinary tract symptoms: Secondary | ICD-10-CM | POA: Diagnosis not present

## 2020-06-13 DIAGNOSIS — N179 Acute kidney failure, unspecified: Secondary | ICD-10-CM | POA: Diagnosis not present

## 2020-06-13 NOTE — Telephone Encounter (Signed)
I get her point.  My understanding is the patient is already been referred to Adult Protective Services multiple times.  I have already called Adult Protective Services about this patient previously.  I am awaiting input from APS.

## 2020-06-14 ENCOUNTER — Telehealth: Payer: Self-pay

## 2020-06-14 NOTE — Telephone Encounter (Signed)
407 pm. Telephone call made to patient to schedule an in-home visit.  He is agreeable to tomorrow morning at 9 am.

## 2020-06-15 ENCOUNTER — Other Ambulatory Visit: Payer: Medicare Other

## 2020-06-15 ENCOUNTER — Telehealth: Payer: Self-pay | Admitting: Neurology

## 2020-06-15 ENCOUNTER — Other Ambulatory Visit: Payer: Self-pay

## 2020-06-15 VITALS — BP 118/60 | HR 71 | Temp 97.3°F | Resp 18 | Wt 130.0 lb

## 2020-06-15 DIAGNOSIS — E871 Hypo-osmolality and hyponatremia: Secondary | ICD-10-CM | POA: Diagnosis not present

## 2020-06-15 DIAGNOSIS — F0390 Unspecified dementia without behavioral disturbance: Secondary | ICD-10-CM | POA: Diagnosis not present

## 2020-06-15 DIAGNOSIS — U071 COVID-19: Secondary | ICD-10-CM | POA: Diagnosis not present

## 2020-06-15 DIAGNOSIS — N179 Acute kidney failure, unspecified: Secondary | ICD-10-CM | POA: Diagnosis not present

## 2020-06-15 DIAGNOSIS — Z515 Encounter for palliative care: Secondary | ICD-10-CM

## 2020-06-15 DIAGNOSIS — E860 Dehydration: Secondary | ICD-10-CM | POA: Diagnosis not present

## 2020-06-15 DIAGNOSIS — N400 Enlarged prostate without lower urinary tract symptoms: Secondary | ICD-10-CM | POA: Diagnosis not present

## 2020-06-15 NOTE — Telephone Encounter (Signed)
Debbie left a message on triage line stating that she was not able to do her routine social work visit with the pt this week. He would never answer the phone. She has spoken to the palliative nurse and knows they are working on transportation for him to get to his neuro appt next week. She will call next week for a new order for social work evaluation.

## 2020-06-15 NOTE — Progress Notes (Signed)
PATIENT NAME: Jeffrey [name] DOB: Oct 22, 1928 MRN: [id number]  PRIMARY CARE PROVIDER: Tonia Ghent, MD  RESPONSIBLE PARTY:  Acct ID - Guarantor Home Phone Work Phone Relationship Acct Type  [id number] Jeffrey, [name]  Self P/F     Jeffrey [name], Jeffrey [name], [place] [address]    PLAN OF CARE and INTERVENTIONS:               1.  GOALS OF CARE/ ADVANCE CARE PLANNING:  Patient desires to remain in his home and independent for as long as possible.               2.  PATIENT/CAREGIVER EDUCATION:  Follow up appointment and safety.               4. PERSONAL EMERGENCY PLAN:  Patient will activate 911 for emergencies.               5.  DISEASE STATUS:  Patient greeted me at the door.  Patient is able to talk me through his morning.  He states he has eaten breakfast which consisted of 2 boiled eggs, crackers, and a banana.  He is driving to [employer] for his tea and fish fillet sandwich daily.  Patient will snack on muffins and crackers.  For dinner he is eating cheeseburgers, frozen meals, yogurt or applesauce.  Patient also has Equate supplement drinks that he is taking 2x daily.  Weight obtained this am and he is at 130 lbs which is a 4 lb weight gain since documented weight from [facility].  Medications reviewed with patient and he is not taking potassium, lactacid, and loratadine.  No refills are needed on Aricept, namenda, aspirin, Pravachol.  Patient continues to lay out his medications on a napkin that is marked for breakfast, lunch, dinner and bedtime.  Aricept and Namenda pill bottles have notes that patient must be seen by provider before refills could be completed.  I have discussed a follow up appointment with neurology.  Patient states he does not feel comfortable driving on [address] due to the amount of construction and would not feel safe driving to the office for an appointment. He is most comfortable with [employer] and [employer] locations and driving.   I have contacted Jeffrey [name]  [facility] to see if a virtual visit could be completed.  Unfortunately, this is not possible as patient is not set up with MyChart and has no way of obtaining a link to establish this.  I have requested an in-person visit and advised receptionist that if transportation could not be established, I would call back to cancel the appointment.  At this time patient is scheduled to see Jeffrey Denmark, NP on 2/15 at 1215 pm.  I have contacted Jeffrey [name], SW and advised of transportation need.  She will follow back up with transportation options.  Patient is engaging in conversation.  Continues to be repetitive with his family situation.  He is engaging in life review and speaks of his time in the [place] and his travels.    HISTORY OF PRESENT ILLNESS:  87 year old male with a dx of Dementia.  Patient is being followed by Palliative Care monthly and PRN.  CODE STATUS: Full ADVANCED DIRECTIVES: Yes MOST FORM: No PPS: 60%   PHYSICAL EXAM:   VITALS: Today's Vitals   06/15/20 0855  Pulse: 71  SpO2: 99%  Weight: 130 lb (59 kg)    LUNGS: CTA, no shortness of breath observed. CARDIAC: HRR EXTREMITIES: No edema  present. SKIN: warm and dry to touch.  No skin breakdown reported. NEURO: Alert to self and location.  He does have to look at his watch to give me the date and time.         Jeffrey Burton, RN

## 2020-06-15 NOTE — Telephone Encounter (Signed)
Pt worsening(phone rep confirmed no new symptoms, just worsening.  Maurine Minister informed if RN has questions she will be called to discuss)

## 2020-06-17 NOTE — Telephone Encounter (Signed)
Noted.  If needed, please give the order for repeat social work evaluation next week.  Thanks.

## 2020-06-18 ENCOUNTER — Telehealth: Payer: Self-pay

## 2020-06-18 NOTE — Telephone Encounter (Signed)
Noted  

## 2020-06-18 NOTE — Telephone Encounter (Signed)
12 pm.  Phone call made to [facility] and patient is placed on the list for a ride to his neurology appointment for tomorrow.  I have also provided my cell # should they not be able to reach patient.  Palliative Care team will be at patient's home tomorrow at 1130  to ensure he is ready.   1215 pm.  Phone call made to patient to advise of above.  He does have some confusion over why he is going to this appointment and how he will get there.  I have reassured him that transportation has already been set up and that Palliative Care team will assist tomorrow.  Patient voiced appreciation for assistance.

## 2020-06-19 ENCOUNTER — Other Ambulatory Visit: Payer: Self-pay

## 2020-06-19 ENCOUNTER — Ambulatory Visit (INDEPENDENT_AMBULATORY_CARE_PROVIDER_SITE_OTHER): Payer: Medicare Other | Admitting: Neurology

## 2020-06-19 VITALS — BP 123/63 | HR 69 | Ht 67.0 in | Wt 133.0 lb

## 2020-06-19 DIAGNOSIS — N179 Acute kidney failure, unspecified: Secondary | ICD-10-CM | POA: Diagnosis not present

## 2020-06-19 DIAGNOSIS — E860 Dehydration: Secondary | ICD-10-CM | POA: Diagnosis not present

## 2020-06-19 DIAGNOSIS — R413 Other amnesia: Secondary | ICD-10-CM

## 2020-06-19 DIAGNOSIS — F0391 Unspecified dementia with behavioral disturbance: Secondary | ICD-10-CM | POA: Diagnosis not present

## 2020-06-19 DIAGNOSIS — E871 Hypo-osmolality and hyponatremia: Secondary | ICD-10-CM | POA: Diagnosis not present

## 2020-06-19 DIAGNOSIS — U071 COVID-19: Secondary | ICD-10-CM | POA: Diagnosis not present

## 2020-06-19 DIAGNOSIS — N400 Enlarged prostate without lower urinary tract symptoms: Secondary | ICD-10-CM | POA: Diagnosis not present

## 2020-06-19 DIAGNOSIS — F0390 Unspecified dementia without behavioral disturbance: Secondary | ICD-10-CM | POA: Diagnosis not present
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
## 2020-06-19 NOTE — Patient Instructions (Signed)
For now we will continue current medications  I am going to call your palliative care nurse, Jeffrey [name] I may make medication adjustment at that point See you back in 6 months

## 2020-06-19 NOTE — Telephone Encounter (Signed)
ERROR

## 2020-06-20 ENCOUNTER — Encounter: Payer: Self-pay | Admitting: Neurology

## 2020-06-20 DIAGNOSIS — E871 Hypo-osmolality and hyponatremia: Secondary | ICD-10-CM | POA: Diagnosis not present

## 2020-06-20 DIAGNOSIS — U071 COVID-19: Secondary | ICD-10-CM | POA: Diagnosis not present

## 2020-06-20 DIAGNOSIS — N179 Acute kidney failure, unspecified: Secondary | ICD-10-CM | POA: Diagnosis not present

## 2020-06-20 DIAGNOSIS — E860 Dehydration: Secondary | ICD-10-CM | POA: Diagnosis not present

## 2020-06-20 DIAGNOSIS — F0390 Unspecified dementia without behavioral disturbance: Secondary | ICD-10-CM | POA: Insufficient documentation

## 2020-06-20 DIAGNOSIS — N400 Enlarged prostate without lower urinary tract symptoms: Secondary | ICD-10-CM | POA: Diagnosis not present

## 2020-06-20 MED ORDER — CITALOPRAM HYDROBROMIDE 10 MG PO TABS: 10.0000 mg | ORAL_TABLET | Freq: Every day | ORAL | 5 refills | Status: DC

## 2020-06-20 MED ORDER — MEMANTINE HCL 10 MG PO TABS: 10.0000 mg | ORAL_TABLET | Freq: Two times a day (BID) | ORAL | 1 refills | Status: DC

## 2020-06-20 MED ORDER — DONEPEZIL HCL 10 MG PO TABS: 10.0000 mg | ORAL_TABLET | Freq: Every day | ORAL | 1 refills | Status: DC

## 2020-06-20 NOTE — Progress Notes (Signed)
I have read the note, and I agree with the clinical assessment and plan.  Aina Rossbach K Ankita Newcomer   

## 2020-06-21 ENCOUNTER — Telehealth: Payer: Self-pay

## 2020-06-21 NOTE — Telephone Encounter (Signed)
820 am.  Phone call made to patient to follow up on his recent Neurology visit.  No answer but HIPPA compliant message has been left requesting a call back.  PLAN:  Awaiting call back.  If no call back is received, I will outreach patient again next week.
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
## 2020-06-22 ENCOUNTER — Telehealth: Payer: Self-pay

## 2020-06-22 NOTE — Telephone Encounter (Signed)
9 am.  Follow up telephone call made to patient.  Patient discussed his recent visit with neurology and felt this went well.  Patient is eating well and feels his weight continues to increase slowly.  We discussed his new prescription of Celexa.  Re-enforced purpose of medication, dosage and side effects.  Patient states he is hopeful this will help his overall mood.  Patient states he drove to the pharmacy this week to pick up his new medication and has started it as of this morning.  Patient then starts discussing his finanical issues with his 2 lawyers.  He states he is talking to the [facility] about this now.  Patient is very fixated on this story and is difficult to redirect.  Advised patient that Palliative Care will reach out to patient again in the next couple of weeks.  He is encouraged to call with any questions or concerns.

## 2020-06-25 DIAGNOSIS — K5790 Diverticulosis of intestine, part unspecified, without perforation or abscess without bleeding: Secondary | ICD-10-CM | POA: Diagnosis not present

## 2020-06-25 DIAGNOSIS — K449 Diaphragmatic hernia without obstruction or gangrene: Secondary | ICD-10-CM | POA: Diagnosis not present

## 2020-06-25 DIAGNOSIS — Z87891 Personal history of nicotine dependence: Secondary | ICD-10-CM | POA: Diagnosis not present

## 2020-06-25 DIAGNOSIS — N529 Male erectile dysfunction, unspecified: Secondary | ICD-10-CM | POA: Diagnosis not present

## 2020-06-25 DIAGNOSIS — F0390 Unspecified dementia without behavioral disturbance: Secondary | ICD-10-CM | POA: Diagnosis not present

## 2020-06-25 DIAGNOSIS — E860 Dehydration: Secondary | ICD-10-CM | POA: Diagnosis not present

## 2020-06-25 DIAGNOSIS — N400 Enlarged prostate without lower urinary tract symptoms: Secondary | ICD-10-CM | POA: Diagnosis not present

## 2020-06-25 DIAGNOSIS — Z86010 Personal history of colonic polyps: Secondary | ICD-10-CM | POA: Diagnosis not present

## 2020-06-25 DIAGNOSIS — J309 Allergic rhinitis, unspecified: Secondary | ICD-10-CM | POA: Diagnosis not present

## 2020-06-25 DIAGNOSIS — E785 Hyperlipidemia, unspecified: Secondary | ICD-10-CM | POA: Diagnosis not present

## 2020-06-25 DIAGNOSIS — Z85820 Personal history of malignant melanoma of skin: Secondary | ICD-10-CM | POA: Diagnosis not present

## 2020-06-25 DIAGNOSIS — N179 Acute kidney failure, unspecified: Secondary | ICD-10-CM | POA: Diagnosis not present

## 2020-06-25 DIAGNOSIS — Z9181 History of falling: Secondary | ICD-10-CM | POA: Diagnosis not present

## 2020-06-25 DIAGNOSIS — U071 COVID-19: Secondary | ICD-10-CM | POA: Diagnosis not present

## 2020-06-25 DIAGNOSIS — E871 Hypo-osmolality and hyponatremia: Secondary | ICD-10-CM | POA: Diagnosis not present

## 2020-06-25 DIAGNOSIS — Z8673 Personal history of transient ischemic attack (TIA), and cerebral infarction without residual deficits: Secondary | ICD-10-CM | POA: Diagnosis not present

## 2020-06-25 DIAGNOSIS — Z7982 Long term (current) use of aspirin: Secondary | ICD-10-CM | POA: Diagnosis not present

## 2020-06-25 DIAGNOSIS — L723 Sebaceous cyst: Secondary | ICD-10-CM | POA: Diagnosis not present

## 2020-06-29 ENCOUNTER — Other Ambulatory Visit: Payer: Self-pay | Admitting: Family Medicine

## 2020-07-02 ENCOUNTER — Telehealth: Payer: Self-pay | Admitting: Family Medicine

## 2020-07-02 NOTE — Telephone Encounter (Signed)
Called and clarified. [facility] is a state funded office and not able to take patient with his insurance. I saw that neurologist sent patient to [facility] and they have reached out and left 2 messages for the patient. I also sent message to [name], a palliative nurse, that has been following patient to ask for assistance with connecting patient to their office. Nothing else further is needed.

## 2020-07-02 NOTE — Telephone Encounter (Signed)
They cant accept due to insurance due to they are state funded,  And this the office on [address] , wanted to change it too [employer] and psch , they are going to close it

## 2020-07-05 DIAGNOSIS — E871 Hypo-osmolality and hyponatremia: Secondary | ICD-10-CM | POA: Diagnosis not present

## 2020-07-05 DIAGNOSIS — U071 COVID-19: Secondary | ICD-10-CM | POA: Diagnosis not present

## 2020-07-05 DIAGNOSIS — N400 Enlarged prostate without lower urinary tract symptoms: Secondary | ICD-10-CM | POA: Diagnosis not present

## 2020-07-05 DIAGNOSIS — N179 Acute kidney failure, unspecified: Secondary | ICD-10-CM | POA: Diagnosis not present

## 2020-07-05 DIAGNOSIS — E860 Dehydration: Secondary | ICD-10-CM | POA: Diagnosis not present

## 2020-07-05 DIAGNOSIS — F0390 Unspecified dementia without behavioral disturbance: Secondary | ICD-10-CM | POA: Diagnosis not present

## 2020-07-06 ENCOUNTER — Telehealth: Payer: Self-pay

## 2020-07-06 DIAGNOSIS — N400 Enlarged prostate without lower urinary tract symptoms: Secondary | ICD-10-CM | POA: Diagnosis not present

## 2020-07-06 DIAGNOSIS — F0390 Unspecified dementia without behavioral disturbance: Secondary | ICD-10-CM | POA: Diagnosis not present

## 2020-07-06 DIAGNOSIS — E860 Dehydration: Secondary | ICD-10-CM | POA: Diagnosis not present

## 2020-07-06 DIAGNOSIS — E871 Hypo-osmolality and hyponatremia: Secondary | ICD-10-CM | POA: Diagnosis not present

## 2020-07-06 DIAGNOSIS — N179 Acute kidney failure, unspecified: Secondary | ICD-10-CM | POA: Diagnosis not present

## 2020-07-06 DIAGNOSIS — U071 COVID-19: Secondary | ICD-10-CM | POA: Diagnosis not present

## 2020-07-06 NOTE — Telephone Encounter (Signed)
Diane nurse with Jeffrey [name] left v/m that pt request agency discharge. Pt told Diane RN that he is doing fine, pt is using weights and does not need HH therapy. Diane said in v/m that pt is not OK at home; pt does not want a medical alert button or device in case he needs help; at visit yesterday Diane said pt was not clear on a lot of things he talked about such as Daisy Floro had been president for 42 years.Neurologist has pt on celexa which has possible interactions with other meds pt is taking. Diane will fax over possible interactions for Dr Damita Dunnings to review. Diane is not sure pt should be taking Celexa. Diane request cb if DR Damita Dunnings thinks needed.

## 2020-07-06 NOTE — Telephone Encounter (Signed)
Spoke with Jeffrey [name] about below and she verbalized understanding and will contact prescribing doctor.

## 2020-07-06 NOTE — Telephone Encounter (Signed)
The person prescribing the celexa needs to offer advice on that.  We need all parties involved. And he needs eval when possible, if he consents.

## 2020-07-16 ENCOUNTER — Other Ambulatory Visit: Payer: Self-pay

## 2020-07-16 ENCOUNTER — Telehealth: Payer: Self-pay

## 2020-07-16 ENCOUNTER — Encounter: Payer: Self-pay | Admitting: Family Medicine

## 2020-07-16 ENCOUNTER — Ambulatory Visit (INDEPENDENT_AMBULATORY_CARE_PROVIDER_SITE_OTHER): Payer: Medicare Other | Admitting: Family Medicine

## 2020-07-16 VITALS — BP 126/76 | HR 66 | Temp 98.0°F | Ht 67.0 in | Wt 133.2 lb

## 2020-07-16 DIAGNOSIS — H6980 Other specified disorders of Eustachian tube, unspecified ear: Secondary | ICD-10-CM | POA: Diagnosis not present

## 2020-07-16 MED ORDER — LORATADINE 10 MG PO TABS: 10.0000 mg | ORAL_TABLET | Freq: Every day | ORAL | 11 refills | Status: DC

## 2020-07-16 NOTE — Patient Instructions (Addendum)
Start back on loratadine  (Claritin)10 mg daily.  If no improvement...  Increase nasal spray to two sprays per nostril EVERY DAY.

## 2020-07-16 NOTE — Telephone Encounter (Signed)
Pt walked in; since 07/13/20 pt has had bilateral ear stuffiness and pressure. Earlier this morning when pt tilted head back to put in drops he felt lightheaded that only lasted short time; not lightheaded now. Advised pt he should not drive if lightheaded and pt voiced understanding but said only lightheaded when tilted head back. Pt has tried nasal spray which has helped stuffiness in ears before but is not helping this time. T 97.6 oral P 71  BP 126/76   Wt 133.6. pt already has appt with Dr Diona Browner today at 11 AM. Pt will wait to be seen.if condition changes or worsens pt will let someone at front desk be aware. Sending note to DR Diona Browner and Butch Penny CMA.

## 2020-07-16 NOTE — Progress Notes (Signed)
Patient ID: Jeffrey [name], male    DOB: [birth year], 87 y.o.   MRN: [id number]  This visit was conducted in person.  BP 126/76   Pulse 66   Temp 98 F (36.7 C) (Temporal)   Ht 5\' 7"  (1.702 m)   Wt 133 lb 4 oz (60.4 kg)   SpO2 99%   BMI 20.87 kg/m    CC:  Chief Complaint  Patient presents with  . Nasal Congestion  . Ear Fullness  . Dry mouth       . Dry Lips    Subjective:   HPI: Jeffrey [name] is a 87 y.o. male  Patient of Dr Josefine Class presenting on 07/16/2020 for Nasal Congestion, Ear Fullness, Dry mouth (/), and Dry Lips  This is a walk in appt.  He has noted bilateral ear stuffiness and pressure x 7-10 days  He has also noted dry lips and dry mouth. Earlier today when he tipped head back to put in eye drops... he had a short episode of dizziness described as  Wobbly, not presyncopal. No further issue with lightheadedness.  He is currently using triamcinolone nasal spray 2 drops per nostril every other daily... using for 4-5 days... minimal change.  No fever, no SOB, no CP.  He used to using loratadine.. but is not anymore.         Relevant past medical, surgical, family and social history reviewed and updated as indicated. Interim medical history since our last visit reviewed. Allergies and medications reviewed and updated. Outpatient Medications Prior to Visit  Medication Sig Dispense Refill  . ascorbic acid (VITAMIN C) 500 MG tablet Take 1 tablet (500 mg total) by mouth daily. 30 tablet 0  . aspirin 81 MG EC tablet Take 81 mg by mouth daily.    . citalopram (CELEXA) 10 MG tablet Take 1 tablet (10 mg total) by mouth daily. 30 tablet 5  . Coenzyme Q10 (COQ-10) 100 MG CAPS Take 200 mg by mouth 3 (three) times daily.    Marland Kitchen donepezil (ARICEPT) 10 MG tablet Take 1 tablet (10 mg total) by mouth at bedtime. 90 tablet 1  . guaifenesin (ROBITUSSIN) 100 MG/5ML syrup Take 5 mLs (100 mg total) by mouth every 6 (six) hours as needed for cough or congestion. 60 mL  0  . lactase (LACTAID) 3000 units tablet Take by mouth daily.    Marland Kitchen loratadine (CLARITIN) 10 MG tablet Take 10 mg by mouth daily.    . memantine (NAMENDA) 10 MG tablet Take 1 tablet (10 mg total) by mouth 2 (two) times daily. 180 tablet 1  . Multiple Vitamins-Minerals (MULTIVITAL) tablet Take 1 tablet by mouth daily.    . pantoprazole (PROTONIX) 40 MG tablet TAKE 1 TABLET BY MOUTH IN THE MORNING 30 tablet 11  . polyethylene glycol powder (MIRALAX) 17 GM/SCOOP powder Take 17 g by mouth daily. 850 g 1  . potassium chloride SA (KLOR-CON) 20 MEQ tablet Take 2 tablets (40 mEq total) by mouth 2 (two) times daily. 10 tablet 0  . pravastatin (PRAVACHOL) 40 MG tablet Take 1 tablet by mouth once daily 30 tablet 3  . simethicone (MYLICON) 381 MG chewable tablet Chew 125 mg by mouth in the morning, at noon, and at bedtime.    . triamcinolone (NASACORT) 55 MCG/ACT AERO nasal inhaler Place 2 sprays into the nose every other day. 1 each 0  . zinc sulfate 220 (50 Zn) MG capsule Take 1 capsule (220 mg total) by mouth daily.  30 capsule 0   No facility-administered medications prior to visit.     Per HPI unless specifically indicated in ROS section below Review of Systems  Constitutional: Negative for fatigue and fever.  HENT: Negative for ear pain.   Eyes: Negative for pain.  Respiratory: Negative for cough and shortness of breath.   Cardiovascular: Negative for chest pain, palpitations and leg swelling.  Gastrointestinal: Negative for abdominal pain.  Genitourinary: Negative for dysuria.  Musculoskeletal: Negative for arthralgias.  Neurological: Negative for syncope, light-headedness and headaches.  Psychiatric/Behavioral: Negative for dysphoric mood.   Objective:  BP 126/76   Pulse 66   Temp 98 F (36.7 C) (Temporal)   Ht 5\' 7"  (1.702 m)   Wt 133 lb 4 oz (60.4 kg)   SpO2 99%   BMI 20.87 kg/m   Wt Readings from Last 3 Encounters:  07/16/20 133 lb 4 oz (60.4 kg)  06/19/20 133 lb (60.3 kg)   06/15/20 130 lb (59 kg)      Physical Exam Constitutional:      Appearance: He is well-developed.  HENT:     Head: Normocephalic.     Right Ear: Hearing normal. A middle ear effusion is present. There is no impacted cerumen. Tympanic membrane is not injected or scarred.     Left Ear: Hearing normal. A middle ear effusion is present. There is no impacted cerumen. Tympanic membrane is not injected or scarred.     Nose: Nose normal.  Neck:     Thyroid: No thyroid mass or thyromegaly.     Vascular: No carotid bruit.     Trachea: Trachea normal.  Cardiovascular:     Rate and Rhythm: Normal rate and regular rhythm.     Pulses: Normal pulses.     Heart sounds: Heart sounds not distant. No murmur heard. No friction rub. No gallop.      Comments: No peripheral edema Pulmonary:     Effort: Pulmonary effort is normal. No respiratory distress.     Breath sounds: Normal breath sounds.  Skin:    General: Skin is warm and dry.     Findings: No rash.  Psychiatric:        Speech: Speech normal.        Behavior: Behavior normal.        Thought Content: Thought content normal.       Results for orders placed or performed during the hospital encounter of 05/17/20  CBC with Differential  Result Value Ref Range   WBC 3.8 (L) 4.0 - 10.5 K/uL   RBC 3.86 (L) 4.22 - 5.81 MIL/uL   Hemoglobin 12.4 (L) 13.0 - 17.0 g/dL   HCT 35.8 (L) 39.0 - 52.0 %   MCV 92.7 80.0 - 100.0 fL   MCH 32.1 26.0 - 34.0 pg   MCHC 34.6 30.0 - 36.0 g/dL   RDW 12.9 11.5 - 15.5 %   Platelets 163 150 - 400 K/uL   nRBC 0.0 0.0 - 0.2 %   Neutrophils Relative % 73 %   Neutro Abs 2.7 1.7 - 7.7 K/uL   Lymphocytes Relative 13 %   Lymphs Abs 0.5 (L) 0.7 - 4.0 K/uL   Monocytes Relative 14 %   Monocytes Absolute 0.5 0.1 - 1.0 K/uL   Eosinophils Relative 0 %   Eosinophils Absolute 0.0 0.0 - 0.5 K/uL   Basophils Relative 0 %   Basophils Absolute 0.0 0.0 - 0.1 K/uL   Immature Granulocytes 0 %   Abs Immature Granulocytes  0.01  0.00 - 0.07 K/uL  Basic metabolic panel  Result Value Ref Range   Sodium 136 135 - 145 mmol/L   Potassium 4.0 3.5 - 5.1 mmol/L   Chloride 102 98 - 111 mmol/L   CO2 21 (L) 22 - 32 mmol/L   Glucose, Bld 95 70 - 99 mg/dL   BUN 24 (H) 8 - 23 mg/dL   Creatinine, Ser 1.12 0.61 - 1.24 mg/dL   Calcium 8.1 (L) 8.9 - 10.3 mg/dL   GFR, Estimated >60 >60 mL/min   Anion gap 13 5 - 15    This visit occurred during the SARS-CoV-2 public health emergency.  Safety protocols were in place, including screening questions prior to the visit, additional usage of staff PPE, and extensive cleaning of exam room while observing appropriate contact time as indicated for disinfecting solutions.   COVID 19 screen:  No recent travel or known exposure to COVID19 The patient denies respiratory symptoms of COVID 19 at this time. The importance of social distancing was discussed today.   Assessment and Plan Problem List Items Addressed This Visit    Dysfunction of eustachian tube - Primary     No red flags.  Most likely dizziness due to ETD from allergies.  Restart loratadine.. if not improving increase nasal steroid to daily.            Eliezer Lofts, MD

## 2020-07-16 NOTE — Assessment & Plan Note (Signed)
No red flags.  Most likely dizziness due to ETD from allergies.  Restart loratadine.. if not improving increase nasal steroid to daily.

## 2020-07-17 ENCOUNTER — Telehealth: Payer: Self-pay

## 2020-07-17 NOTE — Telephone Encounter (Signed)
11:50AM: Palliative care SW outreached patient to scheduled in home visit. Patient inquired if RN will be joining SW as well. Visit scheduled for 3/16 @1pm  with SW and RN.

## 2020-07-17 NOTE — Telephone Encounter (Signed)
Pt saw Dr Diona Browner on 07/16/20.

## 2020-07-18 ENCOUNTER — Other Ambulatory Visit: Payer: Medicare Other

## 2020-07-18 ENCOUNTER — Other Ambulatory Visit: Payer: Self-pay

## 2020-07-18 VITALS — BP 132/60 | HR 72 | Temp 97.9°F | Resp 20 | Wt 133.4 lb

## 2020-07-18 DIAGNOSIS — Z515 Encounter for palliative care: Secondary | ICD-10-CM

## 2020-07-18 NOTE — Progress Notes (Signed)
PATIENT NAME: Jeffrey [name] DOB: 08/30/1928 MRN: [id number]  PRIMARY CARE PROVIDER: Tonia Ghent, MD  RESPONSIBLE PARTY:  Acct ID - Guarantor Home Phone Work Phone Relationship Acct Type  [id number] DELAINE, [phone]  Self P/F     [place], [place], [place] [address]    PLAN OF CARE and INTERVENTIONS:               1.  GOALS OF CARE/ ADVANCE CARE PLANNING:  Patient desires to remain home and independent for as long as possible.               2.  PATIENT/CAREGIVER EDUCATION:  Attempted VA planning but unsuccessful               4. PERSONAL EMERGENCY PLAN:  Activate 911 for emergencies.               5.  DISEASE STATUS: Joint visit completed with [place], SW.  Patient answers the door and is clean and neat in appearance.  Gait is staggering at times.  Patient denies any falls and no assistive devices are being used at this time.   Patient continues with repetitive stories.  He is talking about the loss of his wife over 1 year ago, having lawyers to help with the estate and complications with his son.  It is difficult to provide any type of teaching with patient as he continues to repeat stories.  Patient states he went to the grocery store this am. Patient is eating bananas, 2 boiled eggs cheese crackers breakfast.  He continues to go to [employer] daily for a fish filet sandwich and medium tea.  He is eating freezer meals for dinner.  Patient is drinking protein supplement drinks 2x daily.  Weight gain is maintaining at 133 lbs.   Patient was seen by provider due to dizziness related to allergies.   He continues with his nasal spray daily and has started back on Claritin daily.    SW attempted to help patient with VA options but patient does not seem interested in pursuing this.   Home Health has discharged patient from services.  Son continues to have limited interactions with patient at this time.   HISTORY OF PRESENT ILLNESS:  87 year old male with Dementia.   Patient is being followed by Palliative Care monthly and PRN.  CODE STATUS: Full ADVANCED DIRECTIVES: Yes MOST FORM: No PPS: 60%   PHYSICAL EXAM:   VITALS: Today's Vitals   07/18/20 1246  BP: 132/60  Pulse: 72  Resp: 20  Temp: 97.9 F (36.6 C)  SpO2: 97%  Weight: 133 lb 6.4 oz (60.5 kg)  PainSc: 0-No pain    LUNGS: CTA, no shortness of breath observed. CARDIAC: HRR EXTREMITIES: no edema  SKIN: warm and dry to touch NEURO: Alert to person and place.  Using his watch to help with date and time.   Remembers Palliative Care team faces but not names or which organization we are from.       Lorenza Burton, RN

## 2020-07-18 NOTE — Progress Notes (Signed)
COMMUNITY PALLIATIVE CARE SW NOTE  PATIENT NAME: Jeffrey [name] DOB: [birth year] MRN: [id number]  PRIMARY CARE PROVIDER: Tonia Ghent, MD  RESPONSIBLE PARTY:  Acct ID - Guarantor Home Phone Work Phone Relationship Acct Type  [id number] HENSON, [phone]  Self P/F     [place], [place], [place] [address]     PLAN OF CARE and INTERVENTIONS:             1. GOALS OF CARE/ ADVANCE CARE PLANNING:  Patient is a Full code. Code status discussed in detail with patient. Patients sister has HCPOA. Patient's goal is to remain in the home as independent.   2.         SOCIAL/EMOTIONAL/SPIRITUAL ASSESSMENT/ INTERVENTIONS:  SW and RN Jeffrey [name] met with patient in patients home for a wellness check. Patient appeared to be doing well. He was well groomed and ambulating without assistance.    Patient shared that he feels that he has been doing pretty good. He has been driving to doctors appointments and grocery store and continues to go to [employer] daily for lunch. Patient went to grocery store this morning and purchased protein shakes which he is drinking BID.   RN reviewed medications and checked vitals. No falls. Patient could benefit from medication management assistance.    APS case has been closed by DSS. Jeffrey [name] is also no longer involved in care. Patient has apparent confusion and repeats himself during visit. Patient acknowledges his cognitive deficits. Patient shared that he speaks to his sister daily. Patient shares that he has not spoken to his son lately and brings up the incident when he was IVC'd and continues to blame his son for this.   Patient shared that he was in the [employer] - 4 years in the [employer] and 2 years in the [employer] - in 1950. Patient is not currently connected with the [place] and is not interested in [place] resources at this time.  SW sent email to patients son, inquire about assistance and share VA information for patient.   SW discussed goals and reviewed care  plan. Patient states that he is willing to have assistance in the home but only if insurance will pay for it. Palliative care will continue to monitor and assist with long term care planning as needed.    3.         PATIENT/CAREGIVER EDUCATION/ COPING:  Patient A&O, when asked current day he had to refer back to his watch. Patient has cognitive/memory deficits which at times makes it difficult for him to stay on topic. Patient is widowed. Patient continues to be fixated on the idea that his lawyers and son have been stealing from him and taking advantage of some of his finances.   Patient continues to grieve the loss of his wife who passed in 2020.   4.         PERSONAL EMERGENCY PLAN:  Patient will call 9-1-1 for emergencies.    5.         COMMUNITY RESOURCES COORDINATION/ HEALTH CARE NAVIGATION:  Patient does not have assistance managing health care.   6.         FINANCIAL/LEGAL CONCERNS/INTERVENTIONS:  None.      SOCIAL HX:  Social History   Tobacco Use  . Smoking status: Former Smoker    Years: 5.00    Types: Cigarettes    Quit date: 05/05/1958    Years since quitting: 62.2  . Smokeless tobacco: Never Used  Substance  Use Topics  . Alcohol use: No    CODE STATUS: Full code  ADVANCED DIRECTIVES: N MOST FORM COMPLETE:  N HOSPICE EDUCATION PROVIDED: N  PPS: Patient is independent with all ADL's.    Time spent: 50 min       [place], [place]

## 2020-08-06 ENCOUNTER — Emergency Department (HOSPITAL_COMMUNITY)
Admission: EM | Admit: 2020-08-06 | Discharge: 2020-08-06 | Disposition: A | Discharge status: Home / Self Care | Payer: Medicare Other | Attending: Emergency Medicine | Admitting: Emergency Medicine

## 2020-08-06 ENCOUNTER — Telehealth: Payer: Self-pay

## 2020-08-06 ENCOUNTER — Emergency Department (HOSPITAL_COMMUNITY): Payer: Medicare Other

## 2020-08-06 ENCOUNTER — Telehealth: Payer: Self-pay | Admitting: Family Medicine

## 2020-08-06 DIAGNOSIS — R0789 Other chest pain: Secondary | ICD-10-CM | POA: Diagnosis not present

## 2020-08-06 DIAGNOSIS — Z20822 Contact with and (suspected) exposure to covid-19: Secondary | ICD-10-CM | POA: Diagnosis not present

## 2020-08-06 DIAGNOSIS — Z87891 Personal history of nicotine dependence: Secondary | ICD-10-CM | POA: Diagnosis not present

## 2020-08-06 DIAGNOSIS — H9209 Otalgia, unspecified ear: Secondary | ICD-10-CM | POA: Insufficient documentation

## 2020-08-06 DIAGNOSIS — R0981 Nasal congestion: Secondary | ICD-10-CM | POA: Insufficient documentation

## 2020-08-06 DIAGNOSIS — Z85828 Personal history of other malignant neoplasm of skin: Secondary | ICD-10-CM | POA: Insufficient documentation

## 2020-08-06 DIAGNOSIS — Z7982 Long term (current) use of aspirin: Secondary | ICD-10-CM | POA: Diagnosis not present

## 2020-08-06 DIAGNOSIS — F0390 Unspecified dementia without behavioral disturbance: Secondary | ICD-10-CM | POA: Diagnosis not present

## 2020-08-06 DIAGNOSIS — R42 Dizziness and giddiness: Secondary | ICD-10-CM | POA: Diagnosis not present

## 2020-08-06 LAB — RESP PANEL BY RT-PCR (FLU A&B, COVID) ARPGX2
Influenza A by PCR: NEGATIVE
Influenza B by PCR: NEGATIVE
SARS Coronavirus 2 by RT PCR: NEGATIVE

## 2020-08-06 MED ORDER — OXYMETAZOLINE HCL 0.05 % NA SOLN
1.0000 | Freq: Once | NASAL | Status: AC
  Administered 2020-08-06: 1 via NASAL
  Filled 2020-08-06: qty 30

## 2020-08-06 MED ORDER — DEXAMETHASONE SODIUM PHOSPHATE 10 MG/ML IJ SOLN
10.0000 mg | Freq: Once | INTRAMUSCULAR | Status: AC
  Administered 2020-08-06: 10 mg via INTRAMUSCULAR
  Filled 2020-08-06: qty 1

## 2020-08-06 NOTE — ED Triage Notes (Signed)
Ems brings pt in from home. Pt complains of sinus pressure. States the pressure is worse over his left eye and left ear.

## 2020-08-06 NOTE — Telephone Encounter (Signed)
Rip Harbour at [place] [employer] office called. Patient is disoriented.  Patient has been accusing them of stealing his money.  Patient won't sign papers. Patient has been calling the Sheriff's office 5 - 6 times a day. The Sheriff's office  has had 13 visits in the last 2 weeks. Patient has been threatening to kill everyone at Mr. D'Amillio's office. Patient has a gun and permit. They don't think he can find his way to the office.  Mr.D'Amillio is patient's power of attorney.  Mr D'Amillio wants to talk to [place] about having patient committed. Dr.Duncan can call Mr D'Amillio or schedule a time for Mr.D'Amillio to meet with Dr.Duncan. Rip Harbour said if Dr.Duncan wants to speak to Deputy Canary Brim, his phone number is [phone].

## 2020-08-06 NOTE — Telephone Encounter (Signed)
926 am.  Noted patient is currently in the [facility] ED for allergy symptoms.  I have left a message on patient's home phone requesting a call back to schedule and in-person visit.    PLAN:  Response pending.  If no call back, I would outreach patient again this week.

## 2020-08-06 NOTE — Telephone Encounter (Signed)
I called back to [employer]. Situation discussed.  Concern for safety of patient and others noted.  Rutherford Guys will go to [facility] for IVC.  I will write a letter re: situation.  I didn't call patient in case that would potentially worsen the safety risk.  I called patient's son as a Manufacturing engineer.  He supported my decision.  I thank all involved.    My hope is that IVC will allow for eval and tx of patient and provide for safety of patient and others.    Please put a copy of the letter up front for [employer] or his associate to pick up.  Thanks.

## 2020-08-06 NOTE — Discharge Instructions (Addendum)
Use Afrin spray daily for the next 2 days, recommend 1 spray up each nostril twice a day.  Follow-up with your primary care doctor symptoms are not improved over the next 48 to 72 hours.

## 2020-08-06 NOTE — ED Provider Notes (Signed)
[facility] DEPT Provider Note   CSN: [id number] Arrival date & time: 08/06/20  0809     History Chief Complaint  Patient presents with  . Facial Pain    Jeffrey [name] is a 87 y.o. male.  The history is provided by the patient.  URI Presenting symptoms: congestion, ear pain and rhinorrhea   Presenting symptoms: no cough, no fever and no sore throat   Severity:  Moderate Onset quality:  Gradual Timing:  Constant Progression:  Worsening Chronicity:  Chronic Relieved by:  Nothing Worsened by:  Nothing Associated symptoms: sinus pain   Associated symptoms: no arthralgias, no headaches, no myalgias, no neck pain, no sneezing, no swollen glands and no wheezing        Past Medical History:  Diagnosis Date  . Adenomatous polyp of colon 1995    w/ HGD  . Allergic rhinitis   . BPH (benign prostatic hyperplasia)   . Diverticulosis 09/21/2000  . ED (erectile dysfunction)   . Hyperlipemia 05/05/1988  . Memory loss   . Schamberg's purpura    2012- eval by Dr. Allyson Sabal  . Sebaceous cyst   . Skin cancer 05/05/1998   hx of Scalp-melanoma  . TIA (transient ischemic attack)     Patient Active Problem List   Diagnosis Date Noted  . Dementia ([place]) 06/20/2020  . Hyponatremia 05/11/2020  . Hypokalemia 05/11/2020  . Dizziness 03/07/2020  . Bilateral cold feet 03/07/2020  . Infected sebaceous cyst 01/25/2020  . Rectal bleeding 09/26/2019  . Chronic constipation 09/19/2019  . Nasal sinus congestion 03/08/2019  . Lung nodule 02-11-19  . Death of wife 11-Feb-2019  . Esophageal stenosis 01/09/2017  . Shoulder pain, right 08/24/2016  . Memory change 01/03/2016  . Left ankle pain 11/05/2015  . Advance care planning 12/29/2013  . Allergic rhinitis 12/23/2012  . Medicare annual wellness visit, subsequent 12/22/2011  . History of melanoma 09/22/2011  . Knee pain 08/13/2011  . Paresthesia of foot 08/13/2011  . Dysfunction of eustachian tube 05/01/2011   . ED (erectile dysfunction) 11/14/2010  . Schamberg's purpura 08/01/2010  . EXTERNAL HEMORRHOIDS 05/02/2010  . HYPERTROPHY PROSTATE W/UR OBST & OTH LUTS 10/09/2009  . ELEVATED BLOOD PRESSURE WITHOUT DIAGNOSIS OF HYPERTENSION 10/03/2008  . TRANSIENT ISCHEMIC ATTACK 08/24/2007  . COLONIC POLYPS, HX OF 08/24/2006  . DIVERTICULOSIS, COLON 09/21/2000  . HLD (hyperlipidemia) 05/05/1988    Past Surgical History:  Procedure Laterality Date  . Carotid U/S Nml  09/08/2007  . Colon polyp  2011   B9 Int Hemms  . Colonoscopy polyps  10/05/2003   Divertics in hemms  . COLONOSCOPY W/ POLYPECTOMY  08/1993; 09/21/2000;10/05/2003   B9  . Echo (other)  09/08/2007   nml lvf EF 50-55% Mild Diast Dysfctn Triv Ar  . melanoma scalp  07/2003  . SEPTOPLASTY  1995  . Skin revision vertex of scalp  2009   Dr. Luetta Nutting, [facility]  . TONSILLECTOMY         Family History  Problem Relation Age of Onset  . Rectal cancer Father   . Colon cancer Paternal Aunt   . Other Paternal Aunt        cancer on back of neck  . Prostate cancer Neg Hx     Social History   Tobacco Use  . Smoking status: Former Smoker    Years: 5.00    Types: Cigarettes    Quit date: 05/05/1958    Years since quitting: 62.2  . Smokeless tobacco: Never Used  Vaping Use  . Vaping Use: Never used  Substance Use Topics  . Alcohol use: No  . Drug use: No    Home Medications Prior to Admission medications   Medication Sig Start Date End Date Taking? Authorizing Provider  ascorbic acid (VITAMIN C) 500 MG tablet Take 1 tablet (500 mg total) by mouth daily. 05/15/20   Nita Sells, MD  aspirin 81 MG EC tablet Take 81 mg by mouth daily.    [provider]  citalopram (CELEXA) 10 MG tablet Take 1 tablet (10 mg total) by mouth daily. 06/20/20   Suzzanne Cloud, NP  Coenzyme Q10 (COQ-10) 100 MG CAPS Take 200 mg by mouth 3 (three) times daily.    [provider]  donepezil (ARICEPT) 10 MG tablet Take 1 tablet (10 mg total) by mouth  at bedtime. 06/20/20   Suzzanne Cloud, NP  guaifenesin (ROBITUSSIN) 100 MG/5ML syrup Take 5 mLs (100 mg total) by mouth every 6 (six) hours as needed for cough or congestion. 05/17/20   Caccavale, Sophia, PA-C  lactase (LACTAID) 3000 units tablet Take by mouth daily.    [provider]  loratadine (CLARITIN) 10 MG tablet Take 1 tablet (10 mg total) by mouth daily. 07/16/20   Bedsole, Amy E, MD  memantine (NAMENDA) 10 MG tablet Take 1 tablet (10 mg total) by mouth 2 (two) times daily. 06/20/20   Suzzanne Cloud, NP  Multiple Vitamins-Minerals (MULTIVITAL) tablet Take 1 tablet by mouth daily.    [provider]  pantoprazole (PROTONIX) 40 MG tablet TAKE 1 TABLET BY MOUTH IN THE MORNING 05/31/19   Ladene Artist, MD  polyethylene glycol powder (MIRALAX) 17 GM/SCOOP powder Take 17 g by mouth daily. 05/11/19   Ria Bush, MD  potassium chloride SA (KLOR-CON) 20 MEQ tablet Take 2 tablets (40 mEq total) by mouth 2 (two) times daily. 05/15/20   Nita Sells, MD  pravastatin (PRAVACHOL) 40 MG tablet Take 1 tablet by mouth once daily 06/29/20   Tonia Ghent, MD  simethicone (MYLICON) 604 MG chewable tablet Chew 125 mg by mouth in the morning, at noon, and at bedtime.    [provider]  triamcinolone (NASACORT) 55 MCG/ACT AERO nasal inhaler Place 2 sprays into the nose every other day. 05/17/20   Caccavale, Sophia, PA-C  zinc sulfate 220 (50 Zn) MG capsule Take 1 capsule (220 mg total) by mouth daily. 05/15/20   Nita Sells, MD    Allergies    Flonase [fluticasone propionate] and Tamsulosin  Review of Systems   Review of Systems  Constitutional: Negative for fever.  HENT: Positive for congestion, ear pain, postnasal drip, rhinorrhea, sinus pressure and sinus pain. Negative for dental problem, drooling, ear discharge, facial swelling, hearing loss, mouth sores, nosebleeds, sneezing, sore throat, tinnitus, trouble swallowing and voice change.   Respiratory:  Negative for cough and wheezing.   Musculoskeletal: Negative for arthralgias, myalgias and neck pain.  Neurological: Negative for headaches.    Physical Exam Updated Vital Signs BP (!) 149/72 (BP Location: Right Arm)   Pulse 72   Resp 18   Ht 5\' 7"  (1.702 m)   Wt 59 kg   SpO2 99%   BMI 20.36 kg/m   Physical Exam Vitals and nursing note reviewed.  Constitutional:      General: He is not in acute distress.    Appearance: He is well-developed. He is not ill-appearing.  HENT:     Head: Normocephalic and atraumatic.  Right Ear: Ear canal normal.     Left Ear: Ear canal normal.     Ears:     Comments: No erythematous TMs    Nose: Congestion and rhinorrhea present.  Eyes:     Extraocular Movements: Extraocular movements intact.     Pupils: Pupils are equal, round, and reactive to light.     Comments: Bilateral conjunctiva injected  Cardiovascular:     Rate and Rhythm: Normal rate and regular rhythm.     Pulses: Normal pulses.     Heart sounds: No murmur heard.   Pulmonary:     Effort: Pulmonary effort is normal. No respiratory distress.     Breath sounds: Normal breath sounds.  Abdominal:     Palpations: Abdomen is soft.     Tenderness: There is no abdominal tenderness.  Musculoskeletal:     Cervical back: Normal range of motion and neck supple.  Skin:    General: Skin is warm and dry.     Capillary Refill: Capillary refill takes less than 2 seconds.  Neurological:     General: No focal deficit present.     Mental Status: He is alert.     ED Results / Procedures / Treatments   Labs (all labs ordered are listed, but only abnormal results are displayed) Labs Reviewed  RESP PANEL BY RT-PCR (FLU A&B, COVID) ARPGX2    EKG None  Radiology No results found.  Procedures Procedures   Medications Ordered in ED Medications  dexamethasone (DECADRON) injection 10 mg (has no administration in time range)  oxymetazoline (AFRIN) 0.05 % nasal spray 1 spray (has no  administration in time range)    ED Course  I have reviewed the triage vital signs and the nursing notes.  Pertinent labs & imaging results that were available during my care of the patient were reviewed by me and considered in my medical decision making (see chart for details).    MDM Rules/Calculators/A&P                          Loel Ro is here with nasal congestion.  History of allergies, memory issues.  Patient states runny nose, stopped up ears, sinus pressure, eye irritation bilaterally for the last several days.  Worse than normal for him.  Taking his over-the-counter antihistamines with minimal relief.  Will tested for Covid.  Has no respiratory symptoms.  No cough.  No signs to suggest shingles or other process that would cause sinus pressure/facial pain.  No signs of ear infection on exam.  Overall suspect allergic rhinitis.  Will rule out Covid but will treat with steroid shot, Afrin.  Educated about Afrin use.  Recommend follow-up with primary care doctor.  Discharged in good condition.  This chart was dictated using voice recognition software.  Despite best efforts to proofread,  errors can occur which can change the documentation meaning.   Final Clinical Impression(s) / ED Diagnoses Final diagnoses:  Sinus congestion    Rx / DC Orders ED Discharge Orders    None       Lennice Sites, DO 08/06/20 0830

## 2020-08-07 NOTE — Telephone Encounter (Signed)
Letter upfront ready for pickup

## 2020-08-12 ENCOUNTER — Telehealth: Payer: Self-pay | Admitting: Family Medicine

## 2020-08-12 NOTE — Telephone Encounter (Signed)
Please see previous phone note regarding involuntary commitment.  Please check with Mr. D'Amelio and get update on the situation.  I would direct this call to Mr. D'Amelio and not the patient, given the circumstances.  Thanks.

## 2020-08-16 NOTE — Telephone Encounter (Signed)
I will defer to those involved in the IVC at this point.  That is the best I can offer at this point.   If he has threatened anyone else, I will have to defer to that person to report it as I don't have those details.     I will await the update.  Thanks.

## 2020-08-16 NOTE — Telephone Encounter (Signed)
Spoke with Mr. Jeffrey Porter' Amelio and got an update on situation. [name] stated that they have ordered the IVC with the court and he has an appt with the magistrates office on Monday about this. He stated that he will update [place] if this is able to go thru or not. [name] also stated patient has not convinced someone to drive him around and has been going to the bank and other places threatening them as well.

## 2020-08-21 ENCOUNTER — Other Ambulatory Visit: Payer: Self-pay

## 2020-08-21 ENCOUNTER — Emergency Department (HOSPITAL_COMMUNITY)
Admission: EM | Admit: 2020-08-21 | Discharge: 2020-08-22 | Disposition: A | Discharge status: Home / Self Care | Payer: Medicare Other | Attending: Emergency Medicine | Admitting: Emergency Medicine

## 2020-08-21 ENCOUNTER — Encounter (HOSPITAL_COMMUNITY): Payer: Self-pay | Admitting: Emergency Medicine

## 2020-08-21 DIAGNOSIS — F0390 Unspecified dementia without behavioral disturbance: Secondary | ICD-10-CM | POA: Insufficient documentation

## 2020-08-21 DIAGNOSIS — R4182 Altered mental status, unspecified: Secondary | ICD-10-CM | POA: Diagnosis present

## 2020-08-21 DIAGNOSIS — Z7982 Long term (current) use of aspirin: Secondary | ICD-10-CM | POA: Insufficient documentation

## 2020-08-21 DIAGNOSIS — F22 Delusional disorders: Secondary | ICD-10-CM | POA: Diagnosis not present

## 2020-08-21 DIAGNOSIS — F0391 Unspecified dementia with behavioral disturbance: Secondary | ICD-10-CM | POA: Diagnosis not present

## 2020-08-21 DIAGNOSIS — Z8673 Personal history of transient ischemic attack (TIA), and cerebral infarction without residual deficits: Secondary | ICD-10-CM | POA: Diagnosis not present

## 2020-08-21 DIAGNOSIS — Z20822 Contact with and (suspected) exposure to covid-19: Secondary | ICD-10-CM | POA: Diagnosis not present

## 2020-08-21 DIAGNOSIS — Z85828 Personal history of other malignant neoplasm of skin: Secondary | ICD-10-CM | POA: Diagnosis not present

## 2020-08-21 DIAGNOSIS — Z87891 Personal history of nicotine dependence: Secondary | ICD-10-CM | POA: Diagnosis not present

## 2020-08-21 DIAGNOSIS — I10 Essential (primary) hypertension: Secondary | ICD-10-CM | POA: Diagnosis not present

## 2020-08-21 LAB — COMPREHENSIVE METABOLIC PANEL
ALT: 27 U/L (ref 0–44)
AST: 35 U/L (ref 15–41)
Albumin: 4.1 g/dL (ref 3.5–5.0)
Alkaline Phosphatase: 58 U/L (ref 38–126)
Anion gap: 9 (ref 5–15)
BUN: 19 mg/dL (ref 8–23)
CO2: 23 mmol/L (ref 22–32)
Calcium: 9.3 mg/dL (ref 8.9–10.3)
Chloride: 106 mmol/L (ref 98–111)
Creatinine, Ser: 0.9 mg/dL (ref 0.61–1.24)
GFR, Estimated: >60 mL/min (ref >60)
Glucose, Bld: 103 mg/dL — ABNORMAL HIGH (ref 70–99)
Potassium: 4.3 mmol/L (ref 3.5–5.1)
Sodium: 138 mmol/L (ref 135–145)
Total Bilirubin: 0.4 mg/dL (ref 0.3–1.2)
Total Protein: 7.1 g/dL (ref 6.5–8.1)

## 2020-08-21 LAB — CBC WITH DIFFERENTIAL/PLATELET
Abs Immature Granulocytes: 0.03 10*3/uL (ref 0.00–0.07)
Basophils Absolute: 0.1 10*3/uL (ref 0.0–0.1)
Basophils Relative: 1 %
Eosinophils Absolute: 0.1 10*3/uL (ref 0.0–0.5)
Eosinophils Relative: 1 %
HCT: 39.8 % (ref 39.0–52.0)
Hemoglobin: 13.3 g/dL (ref 13.0–17.0)
Immature Granulocytes: 1 %
Lymphocytes Relative: 22 %
Lymphs Abs: 1.4 10*3/uL (ref 0.7–4.0)
MCH: 29.6 pg (ref 26.0–34.0)
MCHC: 33.4 g/dL (ref 30.0–36.0)
MCV: 88.4 fL (ref 80.0–100.0)
Monocytes Absolute: 0.7 10*3/uL (ref 0.1–1.0)
Monocytes Relative: 10 %
Neutro Abs: 4.3 10*3/uL (ref 1.7–7.7)
Neutrophils Relative %: 65 %
Platelets: 184 10*3/uL (ref 150–400)
RBC: 4.5 MIL/uL (ref 4.22–5.81)
RDW: 13.3 % (ref 11.5–15.5)
WBC: 6.5 10*3/uL (ref 4.0–10.5)
nRBC: 0 % (ref 0.0–0.2)

## 2020-08-21 LAB — RAPID URINE DRUG SCREEN, HOSP PERFORMED
Amphetamines: NOT DETECTED
Barbiturates: NOT DETECTED
Benzodiazepines: NOT DETECTED
Cocaine: NOT DETECTED
Opiates: NOT DETECTED
Tetrahydrocannabinol: NOT DETECTED

## 2020-08-21 MED ORDER — MEMANTINE HCL 5 MG PO TABS
10.0000 mg | ORAL_TABLET | Freq: Two times a day (BID) | ORAL | Status: DC
  Administered 2020-08-21 – 2020-08-22 (×2): 10 mg via ORAL
  Filled 2020-08-21 (×2): qty 2

## 2020-08-21 MED ORDER — DONEPEZIL HCL 5 MG PO TABS
10.0000 mg | ORAL_TABLET | Freq: Every day | ORAL | Status: DC
  Administered 2020-08-21: 10 mg via ORAL
  Filled 2020-08-21 (×2): qty 2

## 2020-08-21 MED ORDER — POTASSIUM CHLORIDE CRYS ER 20 MEQ PO TBCR
40.0000 meq | EXTENDED_RELEASE_TABLET | Freq: Two times a day (BID) | ORAL | Status: DC
  Administered 2020-08-21 – 2020-08-22 (×2): 40 meq via ORAL
  Filled 2020-08-21 (×2): qty 2

## 2020-08-21 MED ORDER — PANTOPRAZOLE SODIUM 40 MG PO TBEC
40.0000 mg | DELAYED_RELEASE_TABLET | Freq: Every day | ORAL | Status: DC
  Administered 2020-08-21 – 2020-08-22 (×2): 40 mg via ORAL
  Filled 2020-08-21 (×2): qty 1

## 2020-08-21 MED ORDER — ASPIRIN EC 81 MG PO TBEC
81.0000 mg | DELAYED_RELEASE_TABLET | Freq: Every day | ORAL | Status: DC
  Administered 2020-08-21 – 2020-08-22 (×2): 81 mg via ORAL
  Filled 2020-08-21: qty 1

## 2020-08-21 MED ORDER — POLYETHYLENE GLYCOL 3350 17 G PO PACK
17.0000 g | PACK | Freq: Every day | ORAL | Status: DC
  Administered 2020-08-22: 17 g via ORAL
  Filled 2020-08-21: qty 1

## 2020-08-21 MED ORDER — PRAVASTATIN SODIUM 20 MG PO TABS
40.0000 mg | ORAL_TABLET | Freq: Every day | ORAL | Status: DC
  Administered 2020-08-21 – 2020-08-22 (×2): 40 mg via ORAL
  Filled 2020-08-21 (×2): qty 2

## 2020-08-21 NOTE — ED Notes (Signed)
Patient is in Consultation for TTS

## 2020-08-21 NOTE — Telephone Encounter (Signed)
Tanna Savoy called asking for Dr. Damita Dunnings. He wanted to give Dr. Damita Dunnings an update on this. He can be reached at [phone]

## 2020-08-21 NOTE — ED Provider Notes (Signed)
[place] DEPT Provider Note   CSN: [id number] Arrival date & time: 08/21/20  1641     History Chief Complaint  Patient presents with  . IVC'd  . Dementia    Jeffrey [name] is a 87 y.o. male.  Patient has a history of significant dementia.  He lives by himself.  His lawyers take care of his finances.  His lawyer has taken out commitment papers on him because they felt like he could not take care of himself and he has been delusional  The history is provided by the patient and the police. No language interpreter was used.  Altered Mental Status Presenting symptoms: behavior changes   Severity:  Moderate Episode history:  Continuous Timing:  Constant Progression:  Worsening Chronicity:  Recurrent Context: dementia        Past Medical History:  Diagnosis Date  . Adenomatous polyp of colon 1995    w/ HGD  . Allergic rhinitis   . BPH (benign prostatic hyperplasia)   . Diverticulosis 09/21/2000  . ED (erectile dysfunction)   . Hyperlipemia 05/05/1988  . Memory loss   . Schamberg's purpura    2012- eval by Dr. Allyson Sabal  . Sebaceous cyst   . Skin cancer 05/05/1998   hx of Scalp-melanoma  . TIA (transient ischemic attack)     Patient Active Problem List   Diagnosis Date Noted  . Dementia ([place]) 06/20/2020  . Hyponatremia 05/11/2020  . Hypokalemia 05/11/2020  . Dizziness 03/07/2020  . Bilateral cold feet 03/07/2020  . Infected sebaceous cyst 01/25/2020  . Rectal bleeding 09/26/2019  . Chronic constipation 09/19/2019  . Nasal sinus congestion 03/08/2019  . Lung nodule Feb 13, 2019  . Death of wife 02-13-19  . Esophageal stenosis 01/09/2017  . Shoulder pain, right 08/24/2016  . Memory change 01/03/2016  . Left ankle pain 11/05/2015  . Advance care planning 12/29/2013  . Allergic rhinitis 12/23/2012  . Medicare annual wellness visit, subsequent 12/22/2011  . History of melanoma 09/22/2011  . Knee pain 08/13/2011  . Paresthesia of  foot 08/13/2011  . Dysfunction of eustachian tube 05/01/2011  . ED (erectile dysfunction) 11/14/2010  . Schamberg's purpura 08/01/2010  . EXTERNAL HEMORRHOIDS 05/02/2010  . HYPERTROPHY PROSTATE W/UR OBST & OTH LUTS 10/09/2009  . ELEVATED BLOOD PRESSURE WITHOUT DIAGNOSIS OF HYPERTENSION 10/03/2008  . TRANSIENT ISCHEMIC ATTACK 08/24/2007  . COLONIC POLYPS, HX OF 08/24/2006  . DIVERTICULOSIS, COLON 09/21/2000  . HLD (hyperlipidemia) 05/05/1988    Past Surgical History:  Procedure Laterality Date  . Carotid U/S Nml  09/08/2007  . Colon polyp  2011   B9 Int Hemms  . Colonoscopy polyps  10/05/2003   Divertics in hemms  . COLONOSCOPY W/ POLYPECTOMY  08/1993; 09/21/2000;10/05/2003   B9  . Echo (other)  09/08/2007   nml lvf EF 50-55% Mild Diast Dysfctn Triv Ar  . melanoma scalp  07/2003  . SEPTOPLASTY  1995  . Skin revision vertex of scalp  2009   Dr. Luetta Nutting, [facility]  . TONSILLECTOMY         Family History  Problem Relation Age of Onset  . Rectal cancer Father   . Colon cancer Paternal Aunt   . Other Paternal Aunt        cancer on back of neck  . Prostate cancer Neg Hx     Social History   Tobacco Use  . Smoking status: Former Smoker    Years: 5.00    Types: Cigarettes    Quit date: 05/05/1958  Years since quitting: 62.3  . Smokeless tobacco: Never Used  Vaping Use  . Vaping Use: Never used  Substance Use Topics  . Alcohol use: No  . Drug use: No    Home Medications Prior to Admission medications   Medication Sig Start Date End Date Taking? Authorizing Provider  aspirin 81 MG EC tablet Take 81 mg by mouth daily.    [provider]  donepezil (ARICEPT) 10 MG tablet Take 1 tablet (10 mg total) by mouth at bedtime. 06/20/20   Suzzanne Cloud, NP  memantine (NAMENDA) 10 MG tablet Take 1 tablet (10 mg total) by mouth 2 (two) times daily. 06/20/20   Suzzanne Cloud, NP  pantoprazole (PROTONIX) 40 MG tablet TAKE 1 TABLET BY MOUTH IN THE MORNING 05/31/19   Ladene Artist, MD   polyethylene glycol powder (MIRALAX) 17 GM/SCOOP powder Take 17 g by mouth daily. 05/11/19   Ria Bush, MD  potassium chloride SA (KLOR-CON) 20 MEQ tablet Take 2 tablets (40 mEq total) by mouth 2 (two) times daily. 05/15/20   Nita Sells, MD  pravastatin (PRAVACHOL) 40 MG tablet Take 1 tablet by mouth once daily 06/29/20   Tonia Ghent, MD    Allergies    Flonase [fluticasone propionate] and Tamsulosin  Review of Systems   Review of Systems  Unable to perform ROS: Dementia    Physical Exam Updated Vital Signs BP (!) 163/80 (BP Location: Right Arm)   Pulse 68   Temp 97.7 F (36.5 C) (Oral)   Resp 18   SpO2 99%   Physical Exam Vitals and nursing note reviewed.  Constitutional:      Appearance: He is well-developed.  HENT:     Head: Normocephalic.     Nose: Nose normal.  Eyes:     General: No scleral icterus.    Conjunctiva/sclera: Conjunctivae normal.  Neck:     Thyroid: No thyromegaly.  Cardiovascular:     Rate and Rhythm: Normal rate and regular rhythm.     Heart sounds: No murmur heard. No friction rub. No gallop.   Pulmonary:     Breath sounds: No stridor. No wheezing or rales.  Chest:     Chest wall: No tenderness.  Abdominal:     General: There is no distension.     Tenderness: There is no abdominal tenderness. There is no rebound.  Musculoskeletal:        General: Normal range of motion.     Cervical back: Neck supple.  Lymphadenopathy:     Cervical: No cervical adenopathy.  Skin:    Findings: No erythema or rash.  Neurological:     Mental Status: He is alert.     Motor: No abnormal muscle tone.     Coordination: Coordination normal.     Comments: Patient is only oriented to person  Psychiatric:     Comments: Patient is delusional and believes his attorneys is trying to steal his money     ED Results / Procedures / Treatments   Labs (all labs ordered are listed, but only abnormal results are displayed) Labs Reviewed   COMPREHENSIVE METABOLIC PANEL - Abnormal; Notable for the following components:      Result Value   Glucose, Bld 103 (*)    All other components within normal limits  RESP PANEL BY RT-PCR (FLU A&B, COVID) ARPGX2  CBC WITH DIFFERENTIAL/PLATELET  RAPID URINE DRUG SCREEN, HOSP PERFORMED  ETHANOL  URINALYSIS, ROUTINE W REFLEX MICROSCOPIC    EKG None  Radiology No results found.  Procedures Procedures   Medications Ordered in ED Medications  aspirin EC tablet 81 mg (has no administration in time range)  donepezil (ARICEPT) tablet 10 mg (has no administration in time range)  memantine (NAMENDA) tablet 10 mg (has no administration in time range)  pantoprazole (PROTONIX) EC tablet 40 mg (has no administration in time range)  polyethylene glycol powder (GLYCOLAX/MIRALAX) container 17 g (has no administration in time range)  potassium chloride SA (KLOR-CON) CR tablet 40 mEq (has no administration in time range)  pravastatin (PRAVACHOL) tablet 40 mg (has no administration in time range)    ED Course  I have reviewed the triage vital signs and the nursing notes.  Pertinent labs & imaging results that were available during my care of the patient were reviewed by me and considered in my medical decision making (see chart for details). Patient with moderate to severe dementia.  And delusional ideas.  Patient was seen by behavioral health and it was felt like the patient needed to go to geriatric psych   MDM Rules/Calculators/A&P                          Patient with severe dementia.  Behavioral health is attempting to get him to [place] Final Clinical Impression(s) / ED Diagnoses Final diagnoses:  None    Rx / DC Orders ED Discharge Orders    None       Milton Ferguson, MD 08/21/20 2200

## 2020-08-21 NOTE — ED Triage Notes (Signed)
Patient IVC'd by lawyer brought in by [facility] . Hx of dementia. Lives alone, takes well care of home. No threat to self or others per sheriff.
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
## 2020-08-21 NOTE — ED Notes (Signed)
The sister would like the nurse or the doctor to call her to find out what is wrong with him. Her name and number is down as the emergency contact.

## 2020-08-22 DIAGNOSIS — F0390 Unspecified dementia without behavioral disturbance: Secondary | ICD-10-CM | POA: Diagnosis not present

## 2020-08-22 LAB — URINALYSIS, ROUTINE W REFLEX MICROSCOPIC
Bilirubin Urine: NEGATIVE
Glucose, UA: NEGATIVE mg/dL
Hgb urine dipstick: NEGATIVE
Ketones, ur: NEGATIVE mg/dL
Leukocytes,Ua: NEGATIVE
Nitrite: NEGATIVE
Protein, ur: NEGATIVE mg/dL
Specific Gravity, Urine: 1.013 (ref 1.005–1.030)
pH: 7 (ref 5.0–8.0)

## 2020-08-22 LAB — RESP PANEL BY RT-PCR (FLU A&B, COVID) ARPGX2
Influenza A by PCR: NEGATIVE
Influenza B by PCR: NEGATIVE
SARS Coronavirus 2 by RT PCR: NEGATIVE

## 2020-08-22 LAB — ETHANOL: Alcohol, Ethyl (B): <10 mg/dL (ref <10)

## 2020-08-22 MED ORDER — STERILE WATER FOR INJECTION IJ SOLN
INTRAMUSCULAR | Status: AC
  Administered 2020-08-22: 0.5 mL
  Filled 2020-08-22: qty 10

## 2020-08-22 MED ORDER — ZIPRASIDONE MESYLATE 20 MG IM SOLR
10.0000 mg | Freq: Once | INTRAMUSCULAR | Status: AC
  Administered 2020-08-22: 10 mg via INTRAMUSCULAR
  Filled 2020-08-22: qty 20

## 2020-08-22 NOTE — ED Notes (Signed)
Per Alesia,RN with TOC, patient's son will be here to pick him up after 1930.

## 2020-08-22 NOTE — ED Notes (Signed)
Patient getting pretty agitated and no really re-directable. Getting up and question why he is here, when told he cannot be up walking around, he started doing jumping jacks and saying he'll do whatever he wants.  Asked patient to please have a seat on the bed for his safety and he refused.  Educated patient that if he doesn't calm down we will have the MD ordered medication to calm him, to which he replied I'm not taking any meds.

## 2020-08-22 NOTE — BH Assessment (Signed)
Clinician requested nursing [name], RN) to set up the TTS cart for patient's TTS re-assessment via secure chat.

## 2020-08-22 NOTE — Discharge Instructions (Signed)
For your behavioral health needs you are advised to follow up with an outpatient psychiatrist.  Contact one of the following providers at your earliest opportunity to schedule an intake appointment:       Gerald Plovsky, MD      [facility]      [address], [address]      [place], [place] [address]      [phone]       Vincent Izediuno, MD      [facility]      [address]      [place], [place] [address]      [phone]   

## 2020-08-22 NOTE — BH Assessment (Signed)
Nursing [name], RN),  provided updates Informed that TTS cart is in use at this time. Clinician will be informed once cart becomes available.

## 2020-08-22 NOTE — ED Notes (Signed)
Pt asleep. Respirations even and unlabored. Sitter at bedside.

## 2020-08-22 NOTE — ED Provider Notes (Signed)
Patient was seen by the psychiatry team here in the ER. Recommended continued outpatient care.  Advise immediate return for any worsening symptoms, fevers pain, or other concerns.     Luna Fuse, MD 08/22/20 [phone]

## 2020-08-22 NOTE — ED Notes (Signed)
Pt awake and calmly sitting on edge of the bed. Sitter at bedside.

## 2020-08-22 NOTE — BH Assessment (Signed)
[place] Assessment Progress Note  Per [name], NP, this pt no longer requires psychiatric hospitalization at this time.  Pt presents under IVC initiated by pt's attorney and initially upheld by EDP Thamas Jaegers, MD, which he has now rescinded.  Pt is psychiatrically cleared.  Discharge instructions include referral information for area psychiatrists that provide specialty care for geriatric patients.  A TOC consult has been ordered to address pt's psychosocial needs.  Dr [name] and pt's nurse, [name], have been notified.  [name], [place] Triage Specialist [phone]

## 2020-08-22 NOTE — ED Notes (Signed)
Patient is resting comfortably. Respirations even and unlabored. Sitter at bedside

## 2020-08-22 NOTE — ED Notes (Signed)
Patient discharged to the care of his son.

## 2020-08-22 NOTE — Progress Notes (Signed)
TOC CM spoke to pt's son, [name] and states he will provide transportation for pt to his home. States pt recently signed with a new attorney and he was experiencing memory changes at that time. States he will discuss with pt and help pt with getting issues resolved. Pt lives at home alone but has a caregiver that comes 2x per week to assist him in the home. Explained pt made need Memory Care and states he will discuss with patient. As pt in the past wanted to remain in his home. [place], [place] ED TOC CM [phone]

## 2020-08-22 NOTE — BH Assessment (Addendum)
Re-Assessment 08/22/2020  Disposition: Per [name], NP, patient is psych cleared. IVC to be rescinded. May benefit from a [facility] consult and questionable exploration of capacity. Patient's nurse [name], RN), EDP (Dr. [name]), Disposition Counselor ([name]), and Charge Nurse [name], RN) all provided disposition updates.    Jeffrey [name] is an 87 year old patient who presented to [facility] with IVC in place due to increasingly aggressive behavior and making threats to his power attorney.   Clinician met with patient for a tele-assessment. Patient was calm and cooperative upon presentation. He greeted this Clinician. He was asked about what brought him to the Emergency Department and states,  "It's no reason to be here, I was home taking care of things with my lawyer at [facility], they haven't been treating me right, and they are trying to take money from my account".   Patient continues on the conversation stating that his spouse died, December 22, 2018, and they shared bank accounts together. Says that he found out someone stole money from this shared account. Patient continues to focu on the alledged mismanagement of his money. Stating that he has contacted  to the police, FBI, and his lawyer on multiple occasions.Marland Kitchen He reports frustration with not finding anyone to help him resolve issues with his money, bonds, and CD's. Patient reporting bank accounts at [employer] and [employer]. He says, "I really want to keep over a million in my bank account and don't want anyone messing with my money".   Patient continued to remain hyper focused on the mis- management of his money. Requiring constant redirection to answer this Clinicians questions. Despite redirection patient became focused on items in his wallet. He pulls his walked out of his pocket to show this  pilot's license and a card indicationg that he has "Right to [place] a Concealed Weapon". Clinician inquired about patients access to firearms and he states, "Oh, No,  I don't have a gun and never have". Patient denies his intent to access a gun stating he has never owned a firearm or any interest in them. 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 being married 2x's, and a son that doesn't treat him right. He reports that his son has sent him to [facility] in the past making allegations that he is "crazy". Patients says that when he arrived to this unknown place they found nothing was wrong with him and discharged him home.   Patient reports living alone in in his home alone since his spouse passed away in 07-18-2018. Patient stating that he has a home to take care, drives his own car, and pays all his bills.   Clinician revisited subjects as patient was so focused on irrelevant events.. Patient again denies that he has suicidal thoughts. Denies homicidal ideations. Denies AVH's. Denies feeling paranoid.   Patient remained calm, cooperative, and very pleasant in today's re-assessment. Patient states that the year is July 18, 2020. He reports the  month and day as April 20. He understands that he is in the hospital. He was able to identify who he is in the assessment today.   Clinician discuss patient's re-assessment with Per [name], NP, who sych cleared patient with follow up recommendations for psychiatric medication management. Disposition Counselor [name]) provided updates to provide patient with this information prior to discharge. Clinician tried to call patient's son with patient's verbal consent to provide disposition updates and/or collateral information. However, the son did not answer.  Clinician contacted the petitioner with patients verbal consent and  provided disposition updates.    Clinician further provided disposition updates to the EDP explaining that patient  benefit from a [facility] consult being that he has some cognitive issues as noted in his history. Upon chart review his son indicated to the TTS Clinician in a collateral note  patient is dx's with Dementia. EDP notified of questionable capacity issues and possible need for exploration. However, EDP informed that  patient was  oriented person, place, and time in the re-assessment. His orientation to situation was questionable. Patient reporting that he lives alone and takes care of himself independently. Also, still driving. Possibly their are some resources in the community that [facility] could discuss with patient to assist with what appears to be baseline symptoms of his noted memory issues (per patient's history).

## 2020-08-22 NOTE — ED Notes (Addendum)
Tried to re-direct patient but was not able to so order Geodon given to keep patient safely, patient took this medication willingly.  Attempted to speech with Tom with TTS to see what the plan is for patient since he has been psych cleared per TTS but no one can be reached to pick patient up, he was able to provide information and stated he wasn't aware patient had been cleared even though he was on the secure chat that stated the patient was.

## 2020-08-22 NOTE — BH Assessment (Addendum)
[place] Assessment Progress Note  Per Lindon Romp, NP, this pt requires psychiatric hospitalization at this time.  Pt presents under IVC initiated by pt's attorney and upheld by EDP Thamas Jaegers, MD.  The following facilities have been contacted to seek placement for this pt, with results as noted:  Beds available, information sent, decision pending: [facility] and [place]) [place]: [facility] (dementia is exclusionary) [facility] (dementia is exclusionary)  At capacity: [facility] [place], [place] Coordinator [phone]

## 2020-08-22 NOTE — ED Notes (Signed)
Patient moved to an empty room for privacy for TTS consult.

## 2020-08-24 NOTE — Telephone Encounter (Signed)
Called and spoke with Jeffrey [name] today. The courts approved the IVC on monday. Patient had been admitted on the 19th but was discharged. It was determined that patient had enough cognitive ability to be released. The ER doctor said that patient is getting worse but theres not much they can do until his cognitive abilities have declined more. I asked Jeffrey [name] if he knows if there have been anymore threats made and he stated he was unsure of anymore. He does know that patient has been getting rides from someone and has been out and about. He and the sheriff are still very worried about patient but now seems to be out of his hands. Jeffrey [name] stated patient has been calling 911 a lot lately and is afraid that patient will be arrested soon. He would love to be able to have patient placed in a facility if ever possible but they may be a long shot if he refuses.

## 2020-08-26 NOTE — Telephone Encounter (Signed)
Duly noted.  Please talk to me about this patient.  Thanks.

## 2020-08-27 ENCOUNTER — Telehealth: Payer: Self-pay

## 2020-08-27 NOTE — Telephone Encounter (Signed)
253 pm.  Phone call made to son Jeffrey [name] to follow up on patient's status.  Jeffrey [name] states patient is back home and seems to be doing well.  Discussion was completed on ALF/Memory Care but patient declined to discuss this further with his son.  Advsied Jeffrey [name] that I would outreach patient to schedule an in-person visit.  No other concerns voiced at this time.  307 pm.  Phone call made to patient to schedule an in-person visit.  Patient declined a visit for tomorrow morning but agrees to a visit on Thursday at 9 am.  Patient then begins rambling on about his issues with the bank, his attorneys and missing money.   Patient states he has to see the [facility] office at 130 pm on Wednesday and he believes they will take his gun permit away.  Active listening provided as patient voices his concerns over his situation.  Patient will be seen on Thursday @ 9 am.

## 2020-08-30 ENCOUNTER — Other Ambulatory Visit: Payer: Self-pay

## 2020-08-30 ENCOUNTER — Other Ambulatory Visit: Payer: Medicare Other

## 2020-08-30 VITALS — BP 118/64 | HR 72 | Temp 97.8°F | Resp 18 | Wt 133.2 lb

## 2020-08-30 DIAGNOSIS — Z515 Encounter for palliative care: Secondary | ICD-10-CM

## 2020-08-30 NOTE — Progress Notes (Signed)
PATIENT NAME: Jeffrey [name] DOB: Jul 20, 1928 MRN: [id number]  PRIMARY CARE PROVIDER: Tonia Ghent, MD  RESPONSIBLE PARTY:  Acct ID - Guarantor Home Phone Work Phone Relationship Acct Type  [id number] [name], [name]  Self P/F     [place], [place], [place] [address]    PLAN OF CARE and INTERVENTIONS:               1.  GOALS OF CARE/ ADVANCE CARE PLANNING:  Patient desires to remain home and independent.               2.  PATIENT/CAREGIVER EDUCATION:  Safety               4. PERSONAL EMERGENCY PLAN:  Activate 911 for emergencies.               5.  DISEASE STATUS:  Patient was not home when I first arrived.  He arrived about 15 minutes late and states he forgot about our visit.  Patient had been to [employer] to pick up groceries.  Patient's gait is unsteady at times.  He will not use assistive devices despite recommendations. Patient notes a fall this week without injury.  He does have a purplish bruise to his right hand middle finger.  Patient continues to have the same conversations about his wife's passing 1 1/2 years ago, missing money, issues with attorneys and complications with his son.  Patient states he went to the [facility] department yesterday for an appointment.  He states that he has been accused of making threats to the bank and his attorneys but he does not recall this.  Patient states his gun permit and gun were not taken away from him.  He notes that he went to the court house yesterday to get assistance in obtaining his late wife's belongings.  Patient engaging in life review and seems to enjoy this conversation.  Patient states he is eating well.  I have obtained his weight and he is maintaining at 133 lbs.   He continues to drink 1-2 supplement drinks daily.  He is making daily trips to [facility] for lunch.  Patient states he is not using the stove or oven to cook.  He continues to buy freezer meals that he microwaves for dinner.   Patient states he  is not sleeping well due to the above concerns.  He is going to bed early but his mind is busy with concerns so he is experiencing some insomnia.   Patient denies any shortness of breath, pain, constipation or nausea/vomitting.  No need for refills on medications.  Patient notes he just picked up 3 refills this morning.  He continues to place his medications on a napkin daily.  Patient denies any new concerns at this time.   HISTORY OF PRESENT ILLNESS:  87 year old male with Dementia.  Patient is currently being followed by Palliative Care monthly and PRN.  CODE STATUS: Full ADVANCED DIRECTIVES: Yes MOST FORM: No PPS: 60%   PHYSICAL EXAM:   VITALS: Today's Vitals   08/30/20 0957  BP: 118/64  Pulse: 72  Resp: 18  Temp: 97.8 F (36.6 C)  SpO2: 99%  Weight: 133 lb 3.2 oz (60.4 kg)  PainSc: 0-No pain    LUNGS: CTA, no rhonchi, rales or wheezes present. CARDIAC: HRR EXTREMITIES: - for edema SKIN: warm and dry to touch.  No visible skin breakdown noted. NEURO: alert and oriented to person and place.  Patient believes it is  December 20, 2020.       Lorenza Burton, RN

## 2020-09-19 ENCOUNTER — Telehealth: Payer: Self-pay | Admitting: Family Medicine

## 2020-09-19 NOTE — Telephone Encounter (Signed)
Please call and offer patient office visit for follow-up.  Thanks.  Would need 30-minute appointment.

## 2020-09-25 ENCOUNTER — Telehealth: Payer: Self-pay

## 2020-09-25 NOTE — Telephone Encounter (Signed)
09/25/20 @ 1PM: Palliative care outreach to patient for monthly visit.   Call unsuccessful x2. SW unable to LVM. Will outreach again at later date and continue to offer palliative care support.

## 2020-10-04 ENCOUNTER — Telehealth: Payer: Self-pay

## 2020-10-04 NOTE — Telephone Encounter (Signed)
415 pm.  3 attempts made to reach patient today.  Answering machine picks up but unable to leave a message as mailbox is full.  PLAN:  Palliative Care will continue to outreach patient to schedule a home visit.

## 2020-10-05 ENCOUNTER — Other Ambulatory Visit: Payer: Self-pay

## 2020-10-05 ENCOUNTER — Other Ambulatory Visit: Payer: Medicare Other

## 2020-10-05 VITALS — BP 124/60 | HR 76 | Temp 97.3°F

## 2020-10-05 DIAGNOSIS — Z515 Encounter for palliative care: Secondary | ICD-10-CM

## 2020-10-05 NOTE — Progress Notes (Signed)
PATIENT NAME: Jeffrey [name] DOB: [birth year] MRN: [id number]  PRIMARY CARE PROVIDER: Tonia Ghent, MD  RESPONSIBLE PARTY:  Acct ID - Guarantor Home Phone Work Phone Relationship Acct Type  [id number] MALLORY, [id number] [phone]  Self P/F     [place], [place], [place] [address]    PLAN OF CARE and INTERVENTIONS:               1.  GOALS OF CARE/ ADVANCE CARE PLANNING:  Patient desires to remain in his home and independent for as long as possible.               2.  PATIENT/CAREGIVER EDUCATION:  Medication management.               4. PERSONAL EMERGENCY PLAN:  Activate 911 for emergencies.               5.  DISEASE STATUS: Patient answers the door and is neat and clean in appearance.  Patient states he went to the barber shop this week for a hair cut.    Patient continues to drive.  He is getting his own groceries and medications at [employer].  Continues to go to [employer] daily for a fish sandwich and tea.  Patient engages in conversation easily.  Repetitive in conversation and talks about his early years in the [place], travels and his late wife [name].  Patient then focuses on financial concerns and involuntary commitment that occurred last year. Patient spends a significant time discussing this and is not easily re-directed. He notes talking to the [place] regarding his firearm and permit.  Patient states he does not carry this and he was not asked to turn his firearm in.  Patient states he has been accused of threatening others but he does not recall any of this.  He states he would only use his firearm if he needed to protect himself.    Patient denies issues with insomnia.  He reports going to bed around 9 pm and awakens around 7 am.  He notes getting up 1-2x during the to void.  Denies any signs or symptoms of UTI.  Patient is ambulatory without assistive devices. His gait is stable and he denies any recent falls.   Patient feels he is doing well overall.  He continues  to manage his own medications and takes as directed.  No current refills are needed at this time.  Re-enforced taking medication as directed.  Offered a pill box but patient declines this as he is happy with current system.   HISTORY OF PRESENT ILLNESS:  87 year old male with Dementia. Patient is being followed by Palliative Care monthly and PRN.  CODE STATUS: Full ADVANCED DIRECTIVES: Yes MOST FORM: No PPS: 60%   PHYSICAL EXAM:   VITALS: Today's Vitals   10/05/20 1220  BP: 124/60  Pulse: 76  Temp: (!) 97.3 F (36.3 C)  SpO2: 98%  PainSc: 0-No pain    LUNGS: CTA, No rhonchi, rales or wheezes present. CARDIAC: HRR EXTREMITIES: - for edema SKIN: warm and dry to touch.  No visible skin breakdown.  NEURO: alert and oriented to person and place.  Using his watch to keep up with the date/time.   Time: 1215 pm- 115 pm.      Lorenza Burton, RN

## 2020-10-07 ENCOUNTER — Telehealth: Payer: Self-pay | Admitting: Family Medicine

## 2020-10-07 NOTE — Telephone Encounter (Signed)
Please check with me about this patient first and then call and offer routine follow-up office visit.  30-minute slot needed.  Thanks.

## 2020-10-09 NOTE — Telephone Encounter (Signed)
Tried to call patient but VM is full and could not leave VM

## 2020-10-17 NOTE — Telephone Encounter (Signed)
I called and spoke with patient and tried to get him to schedule an upcoming appt. Patient would not let me schedule him an appt at this time; states he is very busy right now and can not schedule right now. States he will call back next month to schedule an appt if he can.

## 2020-10-17 NOTE — Telephone Encounter (Signed)
Noted. Thanks.

## 2020-11-19 ENCOUNTER — Telehealth: Payer: Self-pay | Admitting: Family Medicine

## 2020-11-19 NOTE — Telephone Encounter (Signed)
Raquel Sarna [phone] called she would like a call back she is the case manager/ lawyer  and they have a case hearing tomorrow, she needs to know if he is competent or not

## 2020-11-19 NOTE — Telephone Encounter (Signed)
Assuming we have permission to release information, then please refer to my prev notes.  I appreciate the help of all involved.  I can't comment on current competency.    Per my previous notes on 02/07/20:  "Memory loss.  See scanned forms.  He perseverates that his son and his lawyer are not helping him.  He is not oriented to the year or the month.  He does know that today is Tuesday but he thinks it is February 1969.  He has 1 out of 3 short-term recall.  He can still do basic math and read a watch face.  He denies that he has any memory loss."

## 2020-11-19 NOTE — Telephone Encounter (Signed)
Called and spoke with law office; spoke with [facility] assistant. I advised them on below message from Dr. Damita Dunnings. She wanted to know when patient was dx with dementia and date in system was from 06/19/20 from neurology. Advised she may want to discuss with them as they have seen patient more recently then our office has.   We received records request dated 11/12/20 on fax sheet while I was out of the office. I was able to recover this request this am when I was going thru paper work left on my desk/basket. I gave request to Donzetta Matters this am to fax to medical records before law office called about this matter. Raquel Sarna had already faxed over the request to medical records. I explained all of this to the assistant when I was on the phone with her and apologized for the delay.

## 2020-11-20 NOTE — Telephone Encounter (Signed)
Noted, let me know if I can do anything to assist with this.

## 2020-11-21 NOTE — Telephone Encounter (Signed)
Thanks. Noted.

## 2020-11-26 ENCOUNTER — Other Ambulatory Visit: Payer: Self-pay | Admitting: Neurology

## 2020-11-29 ENCOUNTER — Other Ambulatory Visit: Payer: Self-pay | Admitting: Neurology

## 2020-12-03 ENCOUNTER — Telehealth: Payer: Self-pay

## 2020-12-03 NOTE — Telephone Encounter (Signed)
903 am.  Phone call made to patient to schedule a home visit.  Patient is agreeable to a visit tomorrow at 9 am.

## 2020-12-04 ENCOUNTER — Other Ambulatory Visit: Payer: Medicare Other

## 2020-12-04 ENCOUNTER — Other Ambulatory Visit: Payer: Self-pay

## 2020-12-04 VITALS — BP 122/64 | HR 74 | Temp 97.2°F | Resp 18 | Wt 136.8 lb

## 2020-12-04 DIAGNOSIS — Z515 Encounter for palliative care: Secondary | ICD-10-CM

## 2020-12-04 DIAGNOSIS — Z20822 Contact with and (suspected) exposure to covid-19: Secondary | ICD-10-CM | POA: Diagnosis not present

## 2020-12-04 NOTE — Progress Notes (Signed)
COMMUNITY PALLIATIVE CARE SW NOTE  PATIENT NAME: Jeffrey [name] DOB: 09-Nov-1928 MRN: [id number]  PRIMARY CARE PROVIDER: Tonia Ghent, MD  RESPONSIBLE PARTY:  Acct ID - Guarantor Home Phone Work Phone Relationship Acct Type  [id number] [name], [phone]  Self P/F     [place], [place], [place] [phone]     PLAN OF CARE and INTERVENTIONS:             GOALS OF CARE/ ADVANCE CARE PLANNING:  Patient is a Full code. Code status discussed in detail with patient. Patients sister has HCPOA. Patient's goal is to remain in the home as independent.   2.         SOCIAL/EMOTIONAL/SPIRITUAL ASSESSMENT/ INTERVENTIONS:  SW and RN [name] met with patient in patients home for a wellness check. Patient appeared to be doing well. He was well groomed and ambulating without assistance.   Patient had a recent court hearing for guardianship on 11/20/20, per epic. Unsure of decision/verdict at this time. Patient unable to share info or details of hearing due to memory.  Patient shared that he feels that he has been doing pretty good. Patients appearance was well groomed and dressed appropriately. He continues to drive to doctors appointments and grocery store and continues to go to [employer] daily for lunch. Patient shared that he went to get his oil changed this morning.   RN reviewed medications and checked vitals. No falls. Patient could benefit from medication management assistance.   Patient continues to exhibit continued cognitive and memory deficits. Patients ling term memory is fair, as he shares stories and details of his past work and [employer] life as well as his wife passing. Patients short term memory is poor, as he is not oriented to current year patient states that the current year is 2002. Patient denies having memory deficits.   SW discussed goals and reviewed care plan. Patient states that he is willing to have assistance in the home but only if insurance will pay for it.  Palliative care will continue to monitor and assist with long term care planning as needed.   3.         PATIENT/CAREGIVER EDUCATION/ COPING:  Patient A&O, when asked current day he had to refer back to his watch. Patient has cognitive/memory deficits which at times makes it difficult for him to stay on topic. Patient is widowed. Patient continues to be fixated on the idea that his lawyers and son have been stealing from him and taking advantage of some of his finances.    Patient continues to grieve the loss of his wife who passed in 2020.   4.         PERSONAL EMERGENCY PLAN:  Patient will call 9-1-1 for emergencies.   5.         COMMUNITY RESOURCES COORDINATION/ HEALTH CARE NAVIGATION:  Patient does not have assistance managing health care.   6.         FINANCIAL/LEGAL CONCERNS/INTERVENTIONS:  None.      SOCIAL HX:  Social History   Tobacco Use   Smoking status: Former    Years: 5.00    Types: Cigarettes    Quit date: 05/05/1958    Years since quitting: 62.6   Smokeless tobacco: Never  Substance Use Topics   Alcohol use: No    CODE STATUS: Full code ADVANCED DIRECTIVES: Y MOST FORM COMPLETE: N HOSPICE EDUCATION PROVIDED: N  PPS: Patient is independent with ADL's.    Time  spent: 50 min       [place], [place]

## 2020-12-04 NOTE — Progress Notes (Signed)
PATIENT NAME: Jeffrey [name] DOB: 07/03/1928 MRN: [id number]  PRIMARY CARE PROVIDER: Tonia Ghent, MD  RESPONSIBLE PARTY:  Acct ID - Guarantor Home Phone Work Phone Relationship Acct Type  [id number] LEVONTAE, [phone]  Self P/F     [address], [place], [place] [address]    PLAN OF CARE and INTERVENTIONS:               1.  GOALS OF CARE/ ADVANCE CARE PLANNING:  Patient desires to remain home and independent.               2. PERSONAL EMERGENCY PLAN:  Activate 911 for emergencies.               3.  DISEASE STATUS:  Joint visit completed with [place], SW.  Patient states he just finished his shower.  He was at the service station at 730 this morning to get the oil change in his car.  Patient states he took his am medications already and has eaten breakfast.  Patient continues to lay medications on his napkin labeled breakfast, lunch and dinner.   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  Patient believes it is [id number] and the license will expire in 2004. Patient continues to engage in conversation but is limited to early life events and financial issues. There is confusion on dates and some events.  Patient does not feel he has any memory deficits.   Patient continues to drive but this is limited to a 10 mile radius per patient.  Patient is not using assistive devices and denies any recent falls.  He continues to manage household chores as best as he can.  Patient states he is not napping that he can recall.  He goes to bed around 9 pm and awakens around 5-6 am. Patient continues with the same routine he has been doing for the last 6-7 months.  Eating boiled eggs, muffin and crackers for breakfast, McDonald's for lunch and frozen meals for dinner.     HISTORY OF PRESENT ILLNESS:  87 year old male with Dementia.  Patient is being followed by Palliative Care every 4-8 weeks and prn.  CODE STATUS:  Full ADVANCED DIRECTIVES: Yes MOST FORM: No PPS: 60%   PHYSICAL EXAM:   VITALS: Today's Vitals   12/04/20 0917  BP: 122/64  Pulse: 74  Resp: 18  Temp: (!) 97.2 F (36.2 C)  SpO2: 99%  PainSc: 0-No pain    LUNGS: clear to auscultation  CARDIAC: Cor RRR}  EXTREMITIES: - for edema SKIN: Skin color, texture, turgor normal. No rashes or lesions or normal, no edema, temperature normal, and texture normal  NEURO: positive for memory problems       Lorenza Burton, RN

## 2020-12-25 ENCOUNTER — Other Ambulatory Visit: Payer: Self-pay | Admitting: Neurology

## 2020-12-31 ENCOUNTER — Encounter: Payer: Self-pay | Admitting: Neurology

## 2020-12-31 ENCOUNTER — Other Ambulatory Visit: Payer: Self-pay

## 2020-12-31 ENCOUNTER — Encounter (HOSPITAL_COMMUNITY): Payer: Self-pay

## 2020-12-31 ENCOUNTER — Telehealth: Payer: Self-pay | Admitting: Neurology

## 2020-12-31 ENCOUNTER — Ambulatory Visit: Payer: Medicare Other | Admitting: Neurology

## 2020-12-31 ENCOUNTER — Emergency Department (HOSPITAL_COMMUNITY)
Admission: EM | Admit: 2020-12-31 | Discharge: 2020-12-31 | Disposition: A | Discharge status: Home / Self Care | Payer: Medicare Other | Attending: Emergency Medicine | Admitting: Emergency Medicine

## 2020-12-31 ENCOUNTER — Emergency Department (HOSPITAL_COMMUNITY): Payer: Medicare Other

## 2020-12-31 DIAGNOSIS — F0390 Unspecified dementia without behavioral disturbance: Secondary | ICD-10-CM | POA: Diagnosis not present

## 2020-12-31 DIAGNOSIS — Z87891 Personal history of nicotine dependence: Secondary | ICD-10-CM | POA: Insufficient documentation

## 2020-12-31 DIAGNOSIS — M25519 Pain in unspecified shoulder: Secondary | ICD-10-CM | POA: Diagnosis not present

## 2020-12-31 DIAGNOSIS — M549 Dorsalgia, unspecified: Secondary | ICD-10-CM | POA: Diagnosis not present

## 2020-12-31 DIAGNOSIS — M546 Pain in thoracic spine: Secondary | ICD-10-CM | POA: Diagnosis not present

## 2020-12-31 DIAGNOSIS — R0781 Pleurodynia: Secondary | ICD-10-CM | POA: Insufficient documentation

## 2020-12-31 DIAGNOSIS — Z85828 Personal history of other malignant neoplasm of skin: Secondary | ICD-10-CM | POA: Insufficient documentation

## 2020-12-31 DIAGNOSIS — Z7982 Long term (current) use of aspirin: Secondary | ICD-10-CM | POA: Insufficient documentation

## 2020-12-31 DIAGNOSIS — W19XXXA Unspecified fall, initial encounter: Secondary | ICD-10-CM | POA: Diagnosis not present

## 2020-12-31 DIAGNOSIS — S2241XA Multiple fractures of ribs, right side, initial encounter for closed fracture: Secondary | ICD-10-CM | POA: Diagnosis not present

## 2020-12-31 DIAGNOSIS — Z743 Need for continuous supervision: Secondary | ICD-10-CM | POA: Diagnosis not present

## 2020-12-31 MED ORDER — LIDOCAINE 5 % EX PTCH: 1.0000 | MEDICATED_PATCH | CUTANEOUS | 0 refills | Status: DC

## 2020-12-31 NOTE — Discharge Instructions (Addendum)
Use the lidoderm patches as directed   Please follow up with your primary care provider within 5-7 days for re-evaluation of your symptoms. If you do not have a primary care provider, information for a healthcare clinic has been provided for you to make arrangements for follow up care. Please return to the emergency department for any new or worsening symptoms.

## 2020-12-31 NOTE — ED Provider Notes (Signed)
Emergency Medicine Provider Triage Evaluation Note  Jeffrey [name] , a 87 y.o. male  was evaluated in triage.  Pt complains of fall 5 days ago. C/o pain to the right posterior ribs with movement  Review of Systems  Positive: Rib pain Negative: sob  Physical Exam  SpO2 98%  Gen:   Awake, no distress   Resp:  Normal effort  MSK:   Moves extremities without difficulty  Other:  Ttp inferior to the right scapula  Medical Decision Making  Medically screening exam initiated at 4:08 PM.  Appropriate orders placed.  Jeffrey [name] was informed that the remainder of the evaluation will be completed by another provider, this initial triage assessment does not replace that evaluation, and the importance of remaining in the ED until their evaluation is complete.     Jeffrey [name] 12/31/20 1612    Jeffrey Leigh, MD 01/01/21 1447

## 2020-12-31 NOTE — Telephone Encounter (Signed)
This patient did not show for a revisit appointment today. 

## 2020-12-31 NOTE — ED Provider Notes (Signed)
[facility] DEPT Provider Note   CSN: [id number] Arrival date & time: 12/31/20  1556     History Chief Complaint  Patient presents with   Fall   Shoulder Pain    Jeffrey [name] is a 87 y.o. male.  HPI  87 year old male with a history of colon polyps, allergic rhinitis, BPH, diverticulosis, ED, hyperlipidemia, memory loss, [place]umb[place]'s purpura, sebaceous cyst, skin cancer, who presents to the emergency department today for evaluation of right mid back pain.  States that he fell several days ago onto his back.  He did not have any significant symptoms at that time but a few days ago developed pain just inferior to the right scapula.  The pain is improved at rest but when he tries to move a certain way or lift his right upper extremity has some sharp pain.  He is not short of breath and does not have any chest pain.  He denies any other complaints or injuries from the fall.  Past Medical History:  Diagnosis Date   Adenomatous polyp of colon 1995    w/ HGD   Allergic rhinitis    BPH (benign prostatic hyperplasia)    Diverticulosis 09/21/2000   ED (erectile dysfunction)    Hyperlipemia 05/05/1988   Memory loss    Schamberg's purpura    2012- eval by Dr. Allyson Sabal   Sebaceous cyst    Skin cancer 05/05/1998   hx of Scalp-melanoma   TIA (transient ischemic attack)     Patient Active Problem List   Diagnosis Date Noted   Dementia ([place]) 06/20/2020   Hyponatremia 05/11/2020   Hypokalemia 05/11/2020   Dizziness 03/07/2020   Bilateral cold feet 03/07/2020   Infected sebaceous cyst 01/25/2020   Rectal bleeding 09/26/2019   Chronic constipation 09/19/2019   Nasal sinus congestion 03/08/2019   Lung nodule 02/12/2019   Death of wife 2019/02/12   Esophageal stenosis 01/09/2017   Shoulder pain, right 08/24/2016   Memory change 01/03/2016   Left ankle pain 11/05/2015   Advance care planning 12/29/2013   Allergic rhinitis 12/23/2012   Medicare annual  wellness visit, subsequent 12/22/2011   History of melanoma 09/22/2011   Knee pain 08/13/2011   Paresthesia of foot 08/13/2011   Dysfunction of eustachian tube 05/01/2011   ED (erectile dysfunction) 11/14/2010   Schamberg's purpura 08/01/2010   EXTERNAL HEMORRHOIDS 05/02/2010   HYPERTROPHY PROSTATE W/UR OBST & OTH LUTS 10/09/2009   ELEVATED BLOOD PRESSURE WITHOUT DIAGNOSIS OF HYPERTENSION 10/03/2008   TRANSIENT ISCHEMIC ATTACK 08/24/2007   COLONIC POLYPS, HX OF 08/24/2006   DIVERTICULOSIS, COLON 09/21/2000   HLD (hyperlipidemia) 05/05/1988    Past Surgical History:  Procedure Laterality Date   Carotid U/S Nml  09/08/2007   Colon polyp  2011   B9 Int Hemms   Colonoscopy polyps  10/05/2003   Divertics in hemms   COLONOSCOPY W/ POLYPECTOMY  08/1993; 09/21/2000;10/05/2003   B9   Echo (other)  09/08/2007   nml lvf EF 50-55% Mild Diast Dysfctn Triv Ar   melanoma scalp  07/2003   SEPTOPLASTY  1995   Skin revision vertex of scalp  2009   Dr. Luetta Nutting, [facility]   TONSILLECTOMY         Family History  Problem Relation Age of Onset   Rectal cancer Father    Colon cancer Paternal Aunt    Other Paternal Aunt        cancer on back of neck   Prostate cancer Neg Hx  Social History   Tobacco Use   Smoking status: Former    Years: 5.00    Types: Cigarettes    Quit date: 05/05/1958    Years since quitting: 62.7   Smokeless tobacco: Never  Vaping Use   Vaping Use: Never used  Substance Use Topics   Alcohol use: No   Drug use: No    Home Medications Prior to Admission medications   Medication Sig Start Date End Date Taking? Authorizing Provider  lidocaine (LIDODERM) 5 % Place 1 patch onto the skin daily. Remove & Discard patch within 12 hours or as directed by MD 12/31/20  Yes Keia Rask S, PA-C  aspirin 81 MG EC tablet Take 81 mg by mouth daily.    [provider]  citalopram (CELEXA) 10 MG tablet Take 1 tablet by mouth once daily 12/25/20   Suzzanne Cloud, NP  donepezil  (ARICEPT) 10 MG tablet TAKE 1 TABLET BY MOUTH AT BEDTIME 12/25/20   Suzzanne Cloud, NP  loratadine (CLARITIN) 10 MG tablet Take 10 mg by mouth daily as needed for allergies.    [provider]  memantine (NAMENDA) 10 MG tablet Take 1 tablet by mouth twice daily 12/25/20   Suzzanne Cloud, NP  pantoprazole (PROTONIX) 40 MG tablet TAKE 1 TABLET BY MOUTH IN THE MORNING Patient taking differently: Take 40 mg by mouth daily. 05/31/19   Ladene Artist, MD  polyethylene glycol powder (MIRALAX) 17 GM/SCOOP powder Take 17 g by mouth daily. 05/11/19   Ria Bush, MD  potassium chloride SA (KLOR-CON) 20 MEQ tablet Take 2 tablets (40 mEq total) by mouth 2 (two) times daily. Patient not taking: No sig reported 05/15/20   Nita Sells, MD  pravastatin (PRAVACHOL) 40 MG tablet Take 1 tablet by mouth once daily Patient taking differently: Take 40 mg by mouth daily. 06/29/20   Tonia Ghent, MD    Allergies    Flonase [fluticasone propionate] and Tamsulosin  Review of Systems   Review of Systems  Constitutional:  Negative for fever.  Respiratory:  Negative for shortness of breath.   Cardiovascular:  Negative for chest pain.  Gastrointestinal:  Negative for abdominal pain, diarrhea and vomiting.  Musculoskeletal:  Positive for back pain.  Skin:  Negative for wound.  Neurological:  Negative for headaches.   Physical Exam Updated Vital Signs BP (!) 168/77 (BP Location: Left Arm)   Pulse 64   Temp 98.1 F (36.7 C) (Oral)   Resp 16   Ht '5\' 6"'$  (1.676 m)   Wt 61.2 kg   SpO2 100%   BMI 21.79 kg/m   Physical Exam Constitutional:      General: He is not in acute distress.    Appearance: He is well-developed.  Eyes:     Conjunctiva/sclera: Conjunctivae normal.  Cardiovascular:     Rate and Rhythm: Normal rate and regular rhythm.     Heart sounds: Normal heart sounds.  Pulmonary:     Effort: Pulmonary effort is normal.     Breath sounds: Normal breath sounds.  Abdominal:      Palpations: Abdomen is soft.     Tenderness: There is no guarding.  Musculoskeletal:     Comments: No midline spinal ttp. Ttp just inferior to the right scapula that reproduces his pain.   Skin:    General: Skin is warm and dry.  Neurological:     Mental Status: He is alert and oriented to person, place, and time.    ED  Results / Procedures / Treatments   Labs (all labs ordered are listed, but only abnormal results are displayed) Labs Reviewed - No data to display  EKG None  Radiology DG Ribs Unilateral W/Chest Right  Result Date: 12/31/2020 CLINICAL DATA:  Status post fall 5 days ago. Posterior right lower rib pain. EXAM: RIGHT RIBS AND CHEST - 3+ VIEW COMPARISON:  CT chest 03/29/2019 FINDINGS: Old healed right sixth lateral rib fracture. Metallic ground BB marker overlying the lower right chest wall is noted. No acute displaced fracture or other bone lesions are seen involving the ribs. The heart and mediastinal contours are within normal limits. No focal consolidation. No pulmonary edema. No pleural effusion. No pneumothorax. IMPRESSION: 1. No acute displaced right rib fracture. Please note, nondisplaced rib fractures may be occult on radiograph. 2. No acute cardiopulmonary abnormality. Electronically Signed   By: Iven Finn M.D.   On: 12/31/2020 18:25    Procedures Procedures   Medications Ordered in ED Medications - No data to display  ED Course  I have reviewed the triage vital signs and the nursing notes.  Pertinent labs & imaging results that were available during my care of the patient were reviewed by me and considered in my medical decision making (see chart for details).    MDM Rules/Calculators/A&P                          87 year old male presenting to the emergency department today for evaluation of right back pain.  He had a fall a few days ago and is now having pain just inferior to the right scapula.  He has reproducible tenderness on exam it is worse  with lifting his right upper extremity.  An x-ray was completed was does not show any evidence of rib fracture.  Highly doubt scapular fracture in a ground-level fall specially with delayed onset of symptoms.  Patient is in no acute distress.  Will give Rx for Lidoderm patches.  Feel he is appropriate for outpatient follow-up.  Advised on return precautions.  He voices understanding of plan and reasons to and stable for discharge home  Pt seen in conjunction with Dr. Sherry Ruffing who personally evaluated the pt and is in agreement with the plan.   Final Clinical Impression(s) / ED Diagnoses Final diagnoses:  Fall, initial encounter  Acute right-sided back pain, unspecified back location    Rx / DC Orders ED Discharge Orders          Ordered    lidocaine (LIDODERM) 5 %  Every 24 hours        12/31/20 2038             Bishop Dublin 12/31/20 2039    Tegeler, Gwenyth Allegra, MD 01/02/21 1539

## 2020-12-31 NOTE — ED Triage Notes (Signed)
Per EMS- patient is from home. Patient had a fall 5 days ago. Patient states that he woke this AM and c/o pain when he raises his arm.

## 2020-12-31 NOTE — ED Notes (Signed)
Patients daughter would like a call back with an update [phone]  Patients neighbor stated he would pick up patient at discharge

## 2021-01-09 ENCOUNTER — Telehealth: Payer: Self-pay

## 2021-01-09 NOTE — Telephone Encounter (Signed)
125 pm.  Phone call made to patient to schedule a home visit.  Visit is scheduled for next Tuesday at 9 am.

## 2021-01-15 ENCOUNTER — Other Ambulatory Visit: Payer: Self-pay

## 2021-01-15 ENCOUNTER — Other Ambulatory Visit: Payer: Medicare Other

## 2021-01-15 VITALS — BP 118/62 | HR 72 | Temp 97.8°F | Resp 18 | Wt 138.6 lb

## 2021-01-15 DIAGNOSIS — Z515 Encounter for palliative care: Secondary | ICD-10-CM

## 2021-01-15 NOTE — Progress Notes (Signed)
PATIENT NAME: Jeffrey [name] DOB: July 06, 1928 MRN: [id number]  PRIMARY CARE PROVIDER: Tonia Ghent, MD  RESPONSIBLE PARTY:  Acct ID - Guarantor Home Phone Work Phone Relationship Acct Type  [id number] [name], [name]  Self P/F     [address], [place], [place] [address]    PLAN OF CARE and INTERVENTIONS:               1.  GOALS OF CARE/ ADVANCE CARE PLANNING:  Patient desires to remain in his home for a long as possible.               2.  PATIENT/CAREGIVER EDUCATION:  Unable to provide education to patient due to memory issues and fixation on attorney/financial issues.                4. PERSONAL EMERGENCY PLAN:  Activate 911 for emergencies.               5.  DISEASE STATUS:  Joint visit completed with Jeffrey [name], SW.  Upon arrival, patient is not home.  He arrived at 918 am and noted he was at the [place] visiting his late wife Jeffrey [name].  Patient confirms he continues to go to [employer] for grocery shopping and medications.  He continues to go to [employer] daily for a sandwich and tea.  Patient is consuming a protein supplement drink daily.  Almost a 4 lb weight increase since my last visit.  Patient denies any recent falls recalled by patient. His gait is unsteady at times and he is using the furniture/wall to balance himself at times.   He is able to recall going to the ED due to back pain but notes no other issues.  Notable bruising to the top of his head that is purple/greenish in color.   Patient does not recall how this occurred.  Speaks of the scars located on the top of his head from previous skin cancer.   Patient continues to dwell on financial issues with his attorneys and banks.  He talks about his time in [place] and places he visited.  Patient notes his son is not involved in his life at this time.  Patient is using his watch to recall the month and date.  Believes the year is 49.  Patient had a scheduled appointment with neurology but was a no show.  Patient  states he was accused of threatening others with a gun.  He states this did not happen.  Patient confirms he has a gun permit and also a gun in the home.  He states he does not like to carry a gun because of the weight.   Medications reviewed with patient.  Updated medication list.  Patient continues to lay medications on a napkin that are marked morning, lunch, dinner and bedtime.  Patient states he lays out his medications every morning.     HISTORY OF PRESENT ILLNESS:  87 year old male with worsening Dementia.  Patient is being followed by Palliative Care monthly and PRN.  CODE STATUS: Full ADVANCED DIRECTIVES: Yes MOST FORM: No PPS: 60%   PHYSICAL EXAM:   VITALS: Today's Vitals   01/15/21 0921  BP: 118/62  Pulse: 72  Resp: 18  Temp: 97.8 F (36.6 C)  SpO2: 99%  Weight: 138 lb 9.6 oz (62.9 kg)  PainSc: 0-No pain    LUNGS: clear to auscultation  CARDIAC: Cor RRR}  EXTREMITIES: - for edema SKIN: Skin color, texture, turgor normal. No rashes  or lesions or ecchymoses - head  NEURO: positive for gait problems and memory problems  Time: 9 am- 10 am       Jeffrey Burton, RN

## 2021-01-15 NOTE — Progress Notes (Signed)
[facility] PALLIATIVE CARE SW NOTE  PATIENT NAME: Jeffrey [name] DOB: March 06, 1929 MRN: [id number]  PRIMARY CARE PROVIDER: Tonia Ghent, MD  RESPONSIBLE PARTY:  Acct ID - Guarantor Home Phone Work Phone Relationship Acct Type  [id number] [name], [name] [phone]  Self P/F     [address], [place], [place] [address]     PLAN OF CARE and INTERVENTIONS:             GOALS OF CARE/ ADVANCE CARE PLANNING:  Patient is a Full code. Code status discussed in detail with patient. Patients sister and Freight forwarder of [place] [employer], has HCPOA. Patient's goal is to remain in the home as independent.   2.         SOCIAL/EMOTIONAL/SPIRITUAL ASSESSMENT/ INTERVENTIONS:  SW and RN [name] met with patient in patients home for follow up PC visit. Patient appeared to be doing well..   Patient was not home at scheduled time of visit, states that he had went to visit the [place] of his late wife. Patient had an ED visit 12/31/20 due to a fall and having some soreness. Patients recall of this event was minimal as he did not remember the fall but did remember the ED visit. Fall precautions discussed with patient as he does not use any AD. Patient has a bruise on the top of head, of unknown source as patient does not recall when/where the bruise occurred, patient states that it is not sore or giving  him any issues.  Patient had a recent court hearing for guardianship on 11/20/20, per epic. Unsure of decision/verdict at this time. Patient unable to share info or details of hearing due to memory.   Patient shared that he feels that he has been doing pretty good. Patients appearance was well groomed and dressed appropriately. He continues to drive to doctors appointments and grocery store and continues to go to [employer] daily for lunch. Patients current weight is 138.6lb. Patients appetite is good and fluid intake is good. Patient shares that he is sleeping well.   RN reviewed medications and checked vitals.  Patient could benefit from medication management assistance.   Psychosocial assessment completed. No physical needs noted. Patient continues to exhibit continued cognitive and memory deficits. Patients long term memory is fair, as he shares stories and details of his past work and [employer] life as well as his wife passing, often during the visit. Patients short term memory is poor. Mini mental exam given - reports todays date is 11/20/1980. Patient has a watch that tells him the date however patient reported inaccurate date. Patient able to recall his date of birth. Patient denies having memory deficits.   SW discussed goals and reviewed care plan. Patient states that he is willing to have assistance in the home but only if insurance will pay for it. Palliative care will continue to monitor and assist with long term care planning as needed.   3.         PATIENT/CAREGIVER EDUCATION/ COPING:  Patient A&O, when asked current day he had to refer back to his watch. Patient has cognitive/memory deficits which at times makes it difficult for him to stay on topic. Patient is widowed. Patient continues to be fixated on the idea that his lawyers and son have been stealing from him and taking advantage of some of his finances.    Patient continues to grieve the loss of his wife who passed in 2020.   4.  PERSONAL EMERGENCY PLAN:  Patient will call 9-1-1 for emergencies.   5.         COMMUNITY RESOURCES COORDINATION/ HEALTH CARE NAVIGATION:  Patient does not have assistance managing health care.   6.         FINANCIAL/LEGAL CONCERNS/INTERVENTIONS:  None.      SOCIAL HX:  Social History   Tobacco Use   Smoking status: Former    Years: 5.00    Types: Cigarettes    Quit date: 05/05/1958    Years since quitting: 62.7   Smokeless tobacco: Never  Substance Use Topics   Alcohol use: No    CODE STATUS: FULL CODE ADVANCED DIRECTIVES: Y MOST FORM COMPLETE: N HOSPICE EDUCATION PROVIDED:  N  [id number] is independent with all ADL's at this time.  Patient is A&O to self, with below average insight and judgement.    Time spent: 45 min.      Doreene Eland,

## 2021-01-23 ENCOUNTER — Telehealth: Payer: Self-pay

## 2021-01-23 NOTE — Telephone Encounter (Signed)
INCOMING CALL 01/23/21 @9 :20amInez Catalina returned SW call.  Inez Catalina and SW discussed patients cognitive status and ability.  Inez Catalina shred tht patients current HCPOA - Mikal Plane has been revoked. Patient currently does not have HCPOA in place.   Inez Catalina is patients guardian ad litem. Teresa Coombs has been appointment as patients guardian of estate as of 11/20/20.   Patient has another case hearing for guardianship on 02/07/21 @11am .

## 2021-01-23 NOTE — Telephone Encounter (Signed)
01/23/21 @9 :10 AM: Palliative care SW outreach the law office of Raquel Sarna [phone], as SW and Inez Catalina had a scheduled call at [facility] to discuss patient  needs and cognitive ability.  Call did not occur. SW called and LVM for law office. Awaiting return call. Marland Kitchen

## 2021-02-07 ENCOUNTER — Telehealth: Payer: Self-pay

## 2021-02-07 NOTE — Progress Notes (Signed)
PC SW received subpoena to be apart of patients guardianship hearing on 02/07/21 @11am .   02/07/2021 @10 :18 AM: PC SW outreached [employer] to inquire of gurdainship hearing virtual link, as SW did not receiev the link. Call was unsuccessful. SW LVM requesting feedback for guardianship hearing link.

## 2021-02-07 NOTE — Telephone Encounter (Signed)
Na

## 2021-03-04 ENCOUNTER — Telehealth: Payer: Self-pay

## 2021-03-04 NOTE — Telephone Encounter (Signed)
158 pm.  Phone call made to patient's home to schedule a home visit.  No answer and unable to leave a message at this time.  Palliative Care will continue to outreach patient at a later date.

## 2021-04-01 ENCOUNTER — Telehealth: Payer: Self-pay

## 2021-04-01 NOTE — Telephone Encounter (Signed)
135 pm.  Phone call made to patient to complete a telephonic visit or schedule a home visit.  No answer and message has been left on VM requesting a call back.   138 pm Phone call made to son [name] to check on status of patient.   He has not heard from patient.  Tim advised that patient has been appointed a guardian to cover financial and medical since patient has been resistant to him being in this role.  Son feels a home visit would be acceptable.  Advised that I would follow up after visit is completed.

## 2021-04-11 ENCOUNTER — Other Ambulatory Visit: Payer: Medicare Other

## 2021-04-11 ENCOUNTER — Other Ambulatory Visit: Payer: Self-pay

## 2021-04-11 VITALS — BP 130/68 | HR 67 | Temp 97.6°F | Wt 143.4 lb

## 2021-04-11 DIAGNOSIS — Z515 Encounter for palliative care: Secondary | ICD-10-CM

## 2021-04-11 NOTE — Progress Notes (Signed)
PATIENT NAME: Jeffrey [name] DOB: 12/02/1928 MRN: [id number]  PRIMARY CARE PROVIDER: Tonia Ghent, MD  RESPONSIBLE PARTY:  Acct ID - Guarantor Home Phone Work Phone Relationship Acct Type  [id number] HJALMAR, [id number]  Self P/F     [address], [place], [place] [id number]    PLAN OF CARE and INTERVENTIONS:               1.  GOALS OF CARE/ ADVANCE CARE PLANNING: Patient desires to remain home and independent for as long as possible.                2.  PATIENT/CAREGIVER EDUCATION:  Life Alert.               4. PERSONAL EMERGENCY PLAN:  Activate 911 for emergencies.                5.  DISEASE STATUS:  Home visit completed with patient.  Advised that I have made multiple attempts to reach him by phone. Patient states that he is dealing with multiple scam calls and does not answer the phone often.  He is agreeable to a visit this am.  Dementia:  Patient is well-groomed, dressed, clean and neat in appearance.  Remains continent of bowel and bladder.  Continues to drive daily to [employer] at lunch for a fish sandwich and tea.  States he is now going to [employer] and dinner for a chicken sandwich and tea.  Going to [employer] weekly for groceries. Weight obtained today and is 143.4 lbs.  5 lb weight gain since my last visit.  Patient easily engages in conversations.  Continues with some short term memory deficits.  Reminiscing about his life with late wife.  Continues with repetitive conversation regarding finances and bank changes.   Concerned over late wife's items and still has her car but is uncertain what he needs to do with.   Ambulating without assistive devices.  Gait unsteady at times.  Using the walls from time to time to maintain balance.   Safety:  discussed use of a life alert with patient as he lives alone and unsteady gait. He does not feel he needs one at this time and declines additional information.  Medication Management:  Patient continues to manage his own  medications.  Laying pills out on a napkin marked with breakfast, lunch and dinner.  No refills needed at this time.    HISTORY OF PRESENT ILLNESS:  87 year old male with Dementia.   Patient is being followed by Palliative Care monthly and PRN.   CODE STATUS: Full ADVANCED DIRECTIVES: No MOST FORM: No PPS: 60%   PHYSICAL EXAM:   VITALS: Today's Vitals   04/11/21 0908  BP: 130/68  Pulse: 67  Temp: 97.6 F (36.4 C)  SpO2: 99%  PainSc: 0-No pain    LUNGS: clear to auscultation  CARDIAC: Cor RRR}  EXTREMITIES: - for edema SKIN: Skin color, texture, turgor normal. No rashes or lesions or mobility and turgor normal  NEURO: positive for gait problems and memory problems       Lorenza Burton, RN

## 2021-06-12 ENCOUNTER — Other Ambulatory Visit: Payer: Self-pay | Admitting: Neurology

## 2021-06-14 DIAGNOSIS — Z20822 Contact with and (suspected) exposure to covid-19: Secondary | ICD-10-CM | POA: Diagnosis not present

## 2021-06-19 ENCOUNTER — Other Ambulatory Visit: Payer: Self-pay | Admitting: Family Medicine

## 2021-06-19 MED ORDER — PRAVASTATIN SODIUM 40 MG PO TABS: 40.0000 mg | ORAL_TABLET | Freq: Every day | ORAL | 0 refills | Status: DC

## 2021-06-19 NOTE — Telephone Encounter (Signed)
Pt needs a refill on pravastatin (PRAVACHOL) 40 MG tablet sent to [facility]

## 2021-06-19 NOTE — Telephone Encounter (Signed)
1 month supply has been sent. Patient due for an appt; please call to schedule.

## 2021-06-19 NOTE — Addendum Note (Signed)
Addended by: Sherrilee Gilles B on: 06/19/2021 11:19 AM   Modules accepted: Orders

## 2021-06-24 ENCOUNTER — Other Ambulatory Visit: Payer: Self-pay | Admitting: Neurology

## 2021-06-27 ENCOUNTER — Other Ambulatory Visit: Payer: Self-pay | Admitting: Neurology

## 2021-06-27 MED ORDER — PRAVASTATIN SODIUM 40 MG PO TABS: 40.0000 mg | ORAL_TABLET | Freq: Every day | ORAL | 0 refills | Status: DC

## 2021-06-27 NOTE — Telephone Encounter (Signed)
Received an urgent call from Chartered certified accountant at [facility] on [address]. She stated that patient was there yelling and screaming and showing out due to his rx not being sent there. Patient had called our office and [facility] day supply was sent to.  The manager was trying to calm patient down and get him out of the store. She was concerned about patient having dementia. I advised her that patient has several medical problems and can be extremely hostile at times. I advised her that we had [facility] a [facility] as patient is overdue for an appt here to be seen and we have been trying to call him to schedule and appt. He always refuses the appts and refuses to come in; there are several notes about trying to get patient an appt in EMR. Patient was very hostile while I was on the phone with the manager and I advised her I will send in the 30 day supply there so he will leave but he does need to schedule an appt here for future refills. I did advise her if the situation gets worse she really needs to call the cops to remove him from the premise and she agreed. I did send in the medication for the patient. She was working on getting patient to agree to an appt and he refused multiple times to see Dr. Damita Dunnings. He stated several times he did not want to see Dr. Damita Dunnings but would be okay seeing another provider. I advised her that I would need to let Dr. Damita Dunnings know that he no longer wants to see him and wants to transfer. This is a process and I have to see if another provider will take him on as a patient. Patient agreed to let me call him back with an appt when we can. When I was finally able to hang up with the pharmacy manager he had seemed to calm down mostly. Please advise what we need to do here. I am sending note to Dr. Damita Dunnings and [facility] for further instructions.

## 2021-06-27 NOTE — Telephone Encounter (Signed)
Please talk to me about this patient.  He can establish with another doc if they agree to take him on.  I haven't seen patient since 2021 despite attempts to get him to come in.  If he doesn't want to see me, then I should be removed as PCP . Thanks.

## 2021-06-27 NOTE — Addendum Note (Signed)
Addended by: Sherrilee Gilles B on: 06/27/2021 10:29 AM   Modules accepted: Orders

## 2021-06-30 ENCOUNTER — Other Ambulatory Visit: Payer: Self-pay | Admitting: Neurology

## 2021-07-01 ENCOUNTER — Telehealth: Payer: Self-pay

## 2021-07-01 DIAGNOSIS — Z20822 Contact with and (suspected) exposure to covid-19: Secondary | ICD-10-CM | POA: Diagnosis not present

## 2021-07-01 MED ORDER — MEMANTINE HCL 10 MG PO TABS: 10.0000 mg | ORAL_TABLET | Freq: Two times a day (BID) | ORAL | 0 refills | Status: DC

## 2021-07-01 NOTE — Telephone Encounter (Signed)
Refill sent.

## 2021-07-08 ENCOUNTER — Other Ambulatory Visit: Payer: Self-pay | Admitting: Neurology

## 2021-07-22 ENCOUNTER — Other Ambulatory Visit: Payer: Self-pay | Admitting: Family Medicine

## 2021-07-25 ENCOUNTER — Other Ambulatory Visit: Payer: Self-pay

## 2021-07-25 ENCOUNTER — Emergency Department (HOSPITAL_BASED_OUTPATIENT_CLINIC_OR_DEPARTMENT_OTHER)
Admission: EM | Admit: 2021-07-25 | Discharge: 2021-07-25 | Disposition: A | Discharge status: Home / Self Care | Payer: Medicare Other | Attending: Emergency Medicine | Admitting: Emergency Medicine

## 2021-07-25 ENCOUNTER — Emergency Department (HOSPITAL_BASED_OUTPATIENT_CLINIC_OR_DEPARTMENT_OTHER): Payer: Medicare Other | Admitting: Radiology

## 2021-07-25 ENCOUNTER — Encounter (HOSPITAL_BASED_OUTPATIENT_CLINIC_OR_DEPARTMENT_OTHER): Payer: Self-pay | Admitting: Emergency Medicine

## 2021-07-25 DIAGNOSIS — R0981 Nasal congestion: Secondary | ICD-10-CM | POA: Insufficient documentation

## 2021-07-25 DIAGNOSIS — H938X9 Other specified disorders of ear, unspecified ear: Secondary | ICD-10-CM | POA: Diagnosis not present

## 2021-07-25 DIAGNOSIS — R051 Acute cough: Secondary | ICD-10-CM | POA: Diagnosis not present

## 2021-07-25 DIAGNOSIS — R059 Cough, unspecified: Secondary | ICD-10-CM | POA: Insufficient documentation

## 2021-07-25 DIAGNOSIS — Z743 Need for continuous supervision: Secondary | ICD-10-CM | POA: Diagnosis not present

## 2021-07-25 DIAGNOSIS — Z20822 Contact with and (suspected) exposure to covid-19: Secondary | ICD-10-CM | POA: Insufficient documentation

## 2021-07-25 DIAGNOSIS — R0982 Postnasal drip: Secondary | ICD-10-CM | POA: Insufficient documentation

## 2021-07-25 DIAGNOSIS — J989 Respiratory disorder, unspecified: Secondary | ICD-10-CM | POA: Diagnosis not present

## 2021-07-25 LAB — RESP PANEL BY RT-PCR (FLU A&B, COVID) ARPGX2
Influenza A by PCR: NEGATIVE
Influenza B by PCR: NEGATIVE
SARS Coronavirus 2 by RT PCR: NEGATIVE

## 2021-07-25 MED ORDER — PSEUDOEPHEDRINE-GUAIFENESIN ER 60-600 MG PO TB12: 1.0000 | ORAL_TABLET | Freq: Two times a day (BID) | ORAL | 0 refills | Status: DC

## 2021-07-25 NOTE — Discharge Instructions (Signed)
Your flu and COVID swab was negative.  Your chest x-ray showed no pneumonia.  Continue taking Claritin daily for allergies.  In addition take new prescription to help with mucus production and decongestion.  Stay well-hydrated.  You may use over-the-counter nasal sprays as needed. ?

## 2021-07-25 NOTE — ED Triage Notes (Signed)
Via EMS from home. Pt ambulatory with EMS to registration. Pt c/o congestion that started yesterday. Pt denis pain and states that he feels fine.  ?

## 2021-07-25 NOTE — ED Provider Notes (Signed)
?[facility] EMERGENC[facility] DEPT ?Provider Note ? ? ?CSN: [id number] ?Arrival date & time: 07/25/21  0746 ? ?  ? ?History ? ?Chief Complaint  ?Patient presents with  ? Nasal Congestion  ? ? ?Jeffrey [name] is a 88 y.o. male. ? ?HPI ? ?90 9 nasal congestion, ear pressure, intermittent productive cough.  Patient states going on for the past couple days.  He has been intermittently trying over-the-counter management without significant relief.  Denies any fever.  Dizziness, chest pain, back pain.  No GI symptoms.  No recent sick contacts. ? ?Home Medications ?Prior to Admission medications   ?Medication Sig Start Date End Date Taking? Authorizing Provider  ?aspirin 81 MG EC tablet Take 81 mg by mouth daily.    [provider]  ?citalopram (CELEXA) 10 MG tablet Take 1 tablet by mouth once daily 07/08/21   Suzzanne Cloud, NP  ?donepezil (ARICEPT) 10 MG tablet TAKE 1 TABLET BY MOUTH AT BEDTIME 06/24/21   Suzzanne Cloud, NP  ?lidocaine (LIDODERM) 5 % Place 1 patch onto the skin daily. Remove & Discard patch within 12 hours or as directed by MD ?Patient not taking: Reported on 01/15/2021 12/31/20   Couture, Cortni S, PA-C  ?loratadine (CLARITIN) 10 MG tablet Take 10 mg by mouth daily as needed for allergies.    [provider]  ?memantine (NAMENDA) 10 MG tablet Take 1 tablet (10 mg total) by mouth 2 (two) times daily. 07/01/21   Suzzanne Cloud, NP  ?pantoprazole (PROTONIX) 40 MG tablet TAKE 1 TABLET BY MOUTH IN THE MORNING ?Patient not taking: Reported on 01/15/2021 05/31/19   Ladene Artist, MD  ?polyethylene glycol powder [facility]) 17 GM/SCOOP powder Take 17 g by mouth daily. ?Patient not taking: Reported on 01/15/2021 05/11/19   Ria Bush, MD  ?potassium chloride SA (KLOR-CON) 20 MEQ tablet Take 2 tablets (40 mEq total) by mouth 2 (two) times daily. ?Patient not taking: No sig reported 05/15/20   Nita Sells, MD  ?pravastatin (PRAVACHOL) 40 MG tablet Take 1 tablet (40 mg total) by mouth  daily. 06/27/21   Tonia Ghent, MD  ?   ? ?Allergies    ?Flonase [fluticasone propionate] and Tamsulosin   ? ?Review of Systems   ?Review of Systems  ?Constitutional:  Negative for fever.  ?HENT:  Positive for congestion, ear pain, postnasal drip and sneezing.   ?Respiratory:  Positive for cough. Negative for shortness of breath.   ?Cardiovascular:  Negative for chest pain.  ?Gastrointestinal:  Negative for abdominal pain, diarrhea and vomiting.  ?Skin:  Negative for rash.  ?Neurological:  Negative for headaches.  ? ?Physical Exam ?Updated Vital Signs ?BP 137/68   Pulse 67   Temp 97.9 ?F (36.6 ?C) (Oral)   Resp 16   Ht '5\' 7"'$  (1.702 m)   Wt 65.8 kg   SpO2 100%   BMI 22.71 kg/m?  ?Physical Exam ?Vitals and nursing note reviewed.  ?Constitutional:   ?   General: He is not in acute distress. ?   Appearance: Normal appearance. He is not ill-appearing.  ?HENT:  ?   Head: Normocephalic.  ?   Right Ear: External ear normal.  ?   Left Ear: External ear normal.  ?   Ears:  ?   Comments: In your canal, TMs partially.  But no signs of infection ?   Nose: Congestion present.  ?   Mouth/Throat:  ?   Mouth: Mucous membranes are moist.  ?Cardiovascular:  ?  Rate and Rhythm: Normal rate.  ?Pulmonary:  ?   Effort: Pulmonary effort is normal. No respiratory distress.  ?Abdominal:  ?   Palpations: Abdomen is soft.  ?   Tenderness: There is no abdominal tenderness.  ?Skin: ?   General: Skin is warm.  ?Neurological:  ?   Mental Status: He is alert and oriented to person, place, and time. Mental status is at baseline.  ?Psychiatric:     ?   Mood and Affect: Mood normal.  ? ? ?ED Results / Procedures / Treatments   ?Labs ?(all labs ordered are listed, but only abnormal results are displayed) ?Labs Reviewed  ?RESP PANEL BY RT-PCR (FLU A&B, COVID) ARPGX2  ? ? ?EKG ?None ? ?Radiology ?No results found. ? ?Procedures ?Procedures  ? ? ?Medications Ordered in ED ?Medications - No data to display ? ?ED Course/ Medical Decision Making/  A&P ?  ?                        ?Medical Decision Making ?Amount and/or Complexity of Data Reviewed ?Radiology: ordered. ? ?Risk ?OTC drugs. ? ? ?88 year old male presents emergency department with nasal congestion, ear pressure, postnasal drip/intermittently productive cough.  Vitals are stable on arrival.  Flu and COVID swab is negative.  Chest x-ray shows no findings of pneumonia.  Symptoms sound viral versus allergic.  Plan for symptomatic treatment and outpatient follow-up.  Patient at this time appears safe and stable for discharge and close outpatient follow up. Discharge plan and strict return to ED precautions discussed, patient verbalizes understanding and agreement. ? ? ? ? ? ? ? ?Final Clinical Impression(s) / ED Diagnoses ?Final diagnoses:  ?None  ? ? ?Rx / DC Orders ?ED Discharge Orders   ? ? None  ? ?  ? ? ?  ?Lorelle Gibbs, DO ?07/25/21 1211 ? ?

## 2021-07-27 ENCOUNTER — Other Ambulatory Visit: Payer: Self-pay | Admitting: Family Medicine

## 2021-07-30 NOTE — Addendum Note (Signed)
Addended by: Diamond Nickel on: 07/30/2021 05:18 PM ? ? Modules accepted: Orders ? ?

## 2021-07-30 NOTE — Telephone Encounter (Signed)
Due to patient expressing wish to no longer follow under Dr. Carole Civil care, we do not have another provider in the office who can resume care at this point as he is noncompliant with his care and coming in to see his PCP.  ? ?I am mailing patient a certified letter stating his own self reported desire to end the provider/patient relationship.   ? ?We will provide him 1, 30 day refill only on his Pravastatin which appears the only thing we fill for him.  I will also make the pharmacy aware that we are no longer his PCP and if future fills are needed beyond the 30 days to please not contact our office as patient should be setting up with new care provider.  In addition, if he presents in the future with behavioral and safety issues at their pharmacy, they should not contact us but rather contact the police department.  ? ? ?

## 2021-07-31 MED ORDER — PRAVASTATIN SODIUM 40 MG PO TABS: 40.0000 mg | ORAL_TABLET | Freq: Every day | ORAL | 0 refills | Status: DC

## 2021-07-31 NOTE — Addendum Note (Signed)
Addended by: Tonia Ghent on: 07/31/2021 11:55 AM ? ? Modules accepted: Orders ? ?

## 2021-07-31 NOTE — Telephone Encounter (Signed)
Dr. Duncan has been removed as PCP. 

## 2021-07-31 NOTE — Telephone Encounter (Addendum)
Agree.  Thanks.  ?Rx sent.  ?FYI to [facility] [place].  And please remove me as the PCP.  ?

## 2021-08-08 ENCOUNTER — Telehealth: Payer: Self-pay

## 2021-08-08 NOTE — Telephone Encounter (Signed)
Received call from patient. Wanted to know where confusion was as to him wanting a new doctor. I attempted to review message below with patient and he became belligerent and started yelling at me. I gave him a few minutes to continue with hist yelling at me. I asked him I could have opportunity to review information I have with him. His response was " no you are going to tell Dr. Damita Dunnings that he needs to refill my medications". As soon as I started to explain why that would not be an option he began yelling at me once more " I think I will just turn this over to my lawyer." At that time I spoke over him as he would not let me have a brake in the conversation and in formed him that I felt our conversation is not able to come to an appropriate resolution and would be sending a message to someone else who may be able to communicate with him more effectively. Call was ended by myself.  ?

## 2021-08-08 NOTE — Telephone Encounter (Signed)
He has prev declined follow up here, clearly asked to change docs, and hasn't come in for a visit with me since 2021.  I don't have anything else to offer here but I wish him the best.   ?

## 2021-08-08 NOTE — Telephone Encounter (Signed)
Forwarding to Dr. Damita Dunnings as patient clearly expressed unhappiness and wish to transfer from Dr. Josefine Class care.  ? ? ?

## 2021-08-08 NOTE — Telephone Encounter (Addendum)
This is not appropriate behavior.  He elected not to follow up here with me.  I will not fill his meds at this point.  If he comes to the office and displays any uncooperative or unsafe behavior then please call 911.   ?

## 2021-08-08 NOTE — Telephone Encounter (Signed)
[facility] - Client ?Nonclinical Telephone Record  ?AccessNurse? ?Client [facility] - Client ?Client Site [facility] ?Provider Renford Dills - MD ?Contact Type Call ?Who Is Calling Patient / Member / Family / Caregiver ?Caller Name Jeffrey [name] ?Caller Phone Number [phone] ?Patient Name Jeffrey [name] ?Patient DOB Apr 21, 1929 ?Call Type Message Only Information Provided ?Reason for Call Request for General Office Information ?Initial Comment Caller states he got a letter that he believes is a misunderstanding, he is not wanting to leave ?[place] or changing doctors. ?Additional Comment Provided office hours. Older gentleman, very nice, does not want to change anything. ?Disp. Time Disposition Final User ?08/07/2021 6:39:36 PM General Information Provided Yes Mack Hook ?Call Closed By: Mack Hook ?Transaction Date/Time: 08/07/2021 6:31:08 PM (ET ? ? ?Sending note to [facility] and will teams [place]. ?

## 2021-08-08 NOTE — Telephone Encounter (Signed)
As today is my last day, I have spoke and passed this on to Bella Kennedy the interim admin who will take next steps with patient.  ? ?[facility] staff is aware of safety concerns for patient and what to do if patient comes in and is inappropriate.  ? ? ?

## 2021-08-12 DIAGNOSIS — Z20822 Contact with and (suspected) exposure to covid-19: Secondary | ICD-10-CM | POA: Diagnosis not present

## 2021-08-15 NOTE — Telephone Encounter (Signed)
[facility] - Client ?Nonclinical Telephone Record  ?AccessNurse? ?Client [facility] - Client ?Client Site [place] ?Provider Renford Dills - MD ?Contact Type Call ?Who Is Calling Patient / Member / Family / Caregiver ?Caller Name Lynkin Saini ?Caller Phone Number [phone] ?Patient Name Jeffrey [name] ?Patient DOB 1928/11/07 ?Call Type Message Only Information Provided ?Reason for Call Request for General Office Information ?Initial Comment Caller states he got a letter saying they would not see him anymore and he does not want to get ?another doctor ?Additional Comment Office hours provided ?Disp. Time Disposition Final User ?08/15/2021 7:40:11 AM General Information Provided Yes Lisette Abu ?Call Closed By: Lisette Abu ?Transaction Date/Time: 08/15/2021 7:34:11 AM (ET ?

## 2021-08-15 NOTE — Telephone Encounter (Signed)
Sending note to Bella Kennedy interim admin for [facility]. Please see notes below access nurse note. ?

## 2021-08-16 NOTE — Telephone Encounter (Signed)
Jeffrey [name] advised to give pt the pt experience number [phone]. I spoke with pt and offered him the pt experience number. Pt first said "I did not create a problem; someone out at [facility] caused the problem. I advised pt to call pt experience and talk with them. Pt said "I don't know who is causing this problem and why would I call this number." I explained to call pt experience to discuss the letter pt received and any questions or concerns the pt might have about the letter received. Pt wrote down the pt experience phone and and said he would call them. Sending note to Lea as FYI. ?

## 2021-08-19 NOTE — Progress Notes (Signed)
Erroneous Encounter

## 2021-08-21 ENCOUNTER — Telehealth: Payer: Self-pay

## 2021-08-21 NOTE — Telephone Encounter (Signed)
841 am. Phone call to patient to schedule home visit.  Patient will be seen at 9 am tomorrow.  ?

## 2021-08-22 ENCOUNTER — Other Ambulatory Visit: Payer: Medicare Other

## 2021-08-22 DIAGNOSIS — Z515 Encounter for palliative care: Secondary | ICD-10-CM

## 2021-08-22 NOTE — Progress Notes (Signed)
PC RN/SW arrived at patients home for scheduled visit. Patient was not home.  ? ?PC will attempt call patient at later time and reschedule visit. ?

## 2021-09-01 ENCOUNTER — Other Ambulatory Visit: Payer: Self-pay | Admitting: Family Medicine

## 2021-09-04 DIAGNOSIS — Z20822 Contact with and (suspected) exposure to covid-19: Secondary | ICD-10-CM | POA: Diagnosis not present

## 2021-09-21 ENCOUNTER — Other Ambulatory Visit: Payer: Self-pay | Admitting: Neurology

## 2021-09-26 ENCOUNTER — Other Ambulatory Visit: Payer: Self-pay | Admitting: Family Medicine

## 2021-10-01 ENCOUNTER — Encounter: Payer: Self-pay | Admitting: Family Medicine

## 2021-10-02 ENCOUNTER — Other Ambulatory Visit: Payer: Self-pay | Admitting: Family Medicine

## 2021-10-02 ENCOUNTER — Other Ambulatory Visit: Payer: Self-pay | Admitting: Neurology

## 2021-10-02 ENCOUNTER — Telehealth: Payer: Self-pay | Admitting: Neurology

## 2021-10-02 NOTE — Telephone Encounter (Signed)
We will need to obtain the guardianship papers prior to calling back.   We need to know if he ever established care with psychiatry.  His current PCP need to manage his cholesterol medications. This is a condition that needs regular monitoring. ___________________________________ Last seen by Judson Roch on 06/19/2020:  Dementia, progressive memory decline   -MMSE 19/30 today -We will continue Aricept, Namenda -Add on Celexa 10 mg daily to help with paranoia, depression, mood (only low dose, has borderline low QT) -Really think best to refrain from driving, only going short distances at this point, recommend this be continually evaluated by PCP, son, I have told the patient this -He is already working with palliative care, home health -His son is working to establish guardianship, has already been referred to psych, I will place referral to geriatric psych -Adult Protective Services has been contacted multiple times, unclear if they have come out -I have reached out to PCP and to palliative care nurse  -We can see him back in 6 months, if he establishes with psych, not sure he needs to continue to be seen at our office.

## 2021-10-02 NOTE — Telephone Encounter (Signed)
Sheri Stinson(Guardianship Education officer, [employer]) has called to report they are now the guardian of pt and she has called for refills on pt's:donepezil (ARICEPT) 10 MG tablet, memantine (NAMENDA) 10 MG tablet &citalopram (CELEXA) 10 MG tablet &  pravastatin (PRAVACHOL) 40 MG tablet (Dr Damita Dunnings no longer seeing pt, have not seen pt since '21) Barbera Setters is asking if Judson Roch, NP will manage this medication for pt.  Barbera Setters has scheduled pt's f/u. Sheri's office# is [phone] her work cell# is [phone]

## 2021-10-02 NOTE — Telephone Encounter (Signed)
Pt's guardian Shirley Friar said will be having guardianship paperwork faxed over. Have scheduled pt an appt to be able to get refills.

## 2021-10-02 NOTE — Telephone Encounter (Signed)
Pt's guardianship, Ms. Jeffrey [name] called to check if Medical Records received fax of guardianship paperwork.

## 2021-10-03 ENCOUNTER — Telehealth: Payer: Self-pay | Admitting: Neurology

## 2021-10-03 MED ORDER — DONEPEZIL HCL 10 MG PO TABS: 10.0000 mg | ORAL_TABLET | Freq: Every day | ORAL | 0 refills | Status: DC

## 2021-10-03 MED ORDER — MEMANTINE HCL 10 MG PO TABS: 10.0000 mg | ORAL_TABLET | Freq: Two times a day (BID) | ORAL | 0 refills | Status: DC

## 2021-10-03 NOTE — Addendum Note (Signed)
Addended by: Noberto Retort C on: 10/03/2021 11:06 AM   Modules accepted: Orders

## 2021-10-03 NOTE — Telephone Encounter (Signed)
Pt's Department of Social Services/guardian, Shirley Friar request refills for memantine (NAMENDA) 10 MG tablet and donepezil (ARICEPT) 10 MG tablet and citalopram (CELEXA) 10 MG tablet at [place]  Scheduled appt on 03/04/22
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
## 2021-10-03 NOTE — Telephone Encounter (Signed)
Duplicate - added this information to other open note.

## 2021-10-03 NOTE — Telephone Encounter (Signed)
Information from a duplicate phone note: Pt's Department of Social Services/guardian, Shirley Friar request refills for memantine (NAMENDA) 10 MG tablet and donepezil (ARICEPT) 10 MG tablet and citalopram (CELEXA) 10 MG tablet at [place]   Scheduled appt on 03/04/22

## 2021-10-06 MED ORDER — CITALOPRAM HYDROBROMIDE 10 MG PO TABS: 10.0000 mg | ORAL_TABLET | Freq: Every day | ORAL | 0 refills | Status: DC

## 2021-10-06 MED ORDER — MEMANTINE HCL 10 MG PO TABS: 10.0000 mg | ORAL_TABLET | Freq: Two times a day (BID) | ORAL | 0 refills | Status: DC

## 2021-10-06 NOTE — Telephone Encounter (Signed)
Received answering service phone call Sunday and I spoke with [facility]. Stinson.   Apparently, only donepezil was sent in so I will re-send prescriptions for memantine and citalopram

## 2021-10-07 ENCOUNTER — Other Ambulatory Visit: Payer: Self-pay | Admitting: Family Medicine

## 2021-10-09 ENCOUNTER — Encounter: Payer: Self-pay | Admitting: Neurology

## 2021-10-09 ENCOUNTER — Ambulatory Visit (INDEPENDENT_AMBULATORY_CARE_PROVIDER_SITE_OTHER): Payer: Medicare Other | Admitting: Neurology

## 2021-10-09 VITALS — BP 161/78 | HR 68 | Ht 67.0 in | Wt 148.0 lb

## 2021-10-09 DIAGNOSIS — F03B3 Unspecified dementia, moderate, with mood disturbance: Secondary | ICD-10-CM

## 2021-10-09 DIAGNOSIS — R413 Other amnesia: Secondary | ICD-10-CM

## 2021-10-09 MED ORDER — CITALOPRAM HYDROBROMIDE 10 MG PO TABS: 10.0000 mg | ORAL_TABLET | Freq: Every day | ORAL | 1 refills | Status: AC

## 2021-10-09 MED ORDER — MEMANTINE HCL 10 MG PO TABS: 10.0000 mg | ORAL_TABLET | Freq: Two times a day (BID) | ORAL | 3 refills | Status: AC

## 2021-10-09 MED ORDER — DONEPEZIL HCL 10 MG PO TABS: 10.0000 mg | ORAL_TABLET | Freq: Every day | ORAL | 3 refills | Status: AC
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
## 2021-10-09 NOTE — Patient Instructions (Signed)
Meds ordered this encounter  Medications   memantine (NAMENDA) 10 MG tablet    Sig: Take 1 tablet (10 mg total) by mouth 2 (two) times daily.    Dispense:  180 tablet    Refill:  3   donepezil (ARICEPT) 10 MG tablet    Sig: Take 1 tablet (10 mg total) by mouth at bedtime.    Dispense:  90 tablet    Refill:  3   citalopram (CELEXA) 10 MG tablet    Sig: Take 1 tablet (10 mg total) by mouth daily.    Dispense:  90 tablet    Refill:  1   Please establish with primary care doctor  Recommend against driving  Continue current medications  See you back in 6 months

## 2021-10-10 ENCOUNTER — Telehealth: Payer: Self-pay | Admitting: Neurology

## 2021-10-10 NOTE — Telephone Encounter (Signed)
Referral for Internal Medicine sent to [place] [phone].

## 2021-10-14 ENCOUNTER — Other Ambulatory Visit: Payer: Self-pay | Admitting: Family Medicine

## 2021-10-16 ENCOUNTER — Other Ambulatory Visit: Payer: Self-pay

## 2021-11-11 ENCOUNTER — Encounter: Payer: Self-pay | Admitting: Internal Medicine

## 2021-11-11 ENCOUNTER — Other Ambulatory Visit: Payer: Self-pay

## 2021-11-11 ENCOUNTER — Ambulatory Visit (INDEPENDENT_AMBULATORY_CARE_PROVIDER_SITE_OTHER): Payer: Medicare Other | Admitting: Internal Medicine

## 2021-11-11 DIAGNOSIS — F03B3 Unspecified dementia, moderate, with mood disturbance: Secondary | ICD-10-CM

## 2021-11-11 DIAGNOSIS — H6122 Impacted cerumen, left ear: Secondary | ICD-10-CM

## 2021-11-11 DIAGNOSIS — J309 Allergic rhinitis, unspecified: Secondary | ICD-10-CM

## 2021-11-11 DIAGNOSIS — E785 Hyperlipidemia, unspecified: Secondary | ICD-10-CM | POA: Diagnosis not present

## 2021-11-11 DIAGNOSIS — H6983 Other specified disorders of Eustachian tube, bilateral: Secondary | ICD-10-CM | POA: Diagnosis not present

## 2021-11-11 DIAGNOSIS — R030 Elevated blood-pressure reading, without diagnosis of hypertension: Secondary | ICD-10-CM

## 2021-11-11 DIAGNOSIS — Z7189 Other specified counseling: Secondary | ICD-10-CM | POA: Diagnosis not present

## 2021-11-11 NOTE — Patient Instructions (Signed)
[name], it was a pleasure seeing you today!  Today we discussed: Medications- I have given you several pill boxes. Use these to help organize your medications. Please bring your list with your at follow-up visit so we can look over what you are taking over the counter.   Allergies: please take claritin 1 time daily for next several weeks. Use flonase 1 time daily.  Blood pressure: your blood pressure was elevated today. I do not want to start blood pressure medications and we will recheck this at follow-up.  Hearing loss: please make follow-up appointment in 2 weeks and we will clean out your left ear at that time.  I have ordered the following labs today:  Lab Orders  No laboratory test(s) ordered today     I will call if any are abnormal. All of your labs can be accessed through "My Chart"   My Chart Access: [email or web address]   Referrals ordered today:   Referral Orders  No referral(s) requested today     I have ordered the following medication/changed the following medications:   Stop the following medications: Medications Discontinued During This Encounter  Medication Reason   pantoprazole (PROTONIX) 40 MG tablet Completed Course     Start the following medications: No orders of the defined types were placed in this encounter.    Follow-up:  2 weeks    Please make sure to arrive 15 minutes prior to your next appointment. If you arrive late, you may be asked to reschedule.   We look forward to seeing you next time. Please call our clinic at [phone] if you have any questions or concerns. The best time to call is Monday-Friday from 9am-4pm, but there is someone available 24/7. If after hours or the weekend, call the main hospital number and ask for the Internal Medicine Resident On-Call. If you need medication refills, please notify your pharmacy one week in advance and they will send [place] a request.  Thank  you for letting us take part in your care. Wishing you the best!  Thank you, Dr. [facility]
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
## 2021-11-12 ENCOUNTER — Encounter: Payer: Self-pay | Admitting: Internal Medicine

## 2021-11-12 DIAGNOSIS — Z7189 Other specified counseling: Secondary | ICD-10-CM | POA: Insufficient documentation

## 2021-11-12 NOTE — Assessment & Plan Note (Signed)
Blood pressure was initially elevated to 142/60.  On recheck blood pressure at 123/59.  On chart review diastolic pressures typically within 60s.  Patient denies shortness of breath, headache, chest pain, and blurry vision. A/P: In setting of wide pulse pressure, I would not add on hypertensive therapies.  Pulse pressure likely due to stiffening of arteries. -recheck at follow-up

## 2021-11-12 NOTE — Assessment & Plan Note (Signed)
[name] is a 88 year old with mild dementia who lives alone.  He is followed by a Education officer, museum with [facility] who is his legal guardian.  He also has palliative care services who stop and to check up on him.  His goals of care are to remain at home and preserve independence as able.  He is independent in IADLs and continues to drive to get groceries and pick up lunch from fast food restaurants.  He states that he does not have any people to talk to as he is unsure if his neighbors are still living as they are also older in age. On exam, he is well-groomed, he is able to transition without difficulty from chair to examination table, enlarged joints of DIP and PIP of fingers bilaterally. Jeffrey [name] states that the [facility] is in the process of taking away his license. A/P: I talked with Jeffrey [name] about PACE program.  This program aligns with his goals of wanting to stay at home as long as possible, but would offer him additional resources to help him do so. Jeffrey [name] has heard of program and appreciates additional information.  She will discuss with patient. -follows with palliative
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
## 2021-11-12 NOTE — Assessment & Plan Note (Signed)
See not on allergic rhinitis.

## 2021-11-12 NOTE — Assessment & Plan Note (Signed)
Patient with history of hyperlipidemia in setting of secondary prevention following stroke presents to establish care.  He is unsure if he is currently taking a statin medication.  I asked patient to bring back medication list at follow-up. - Follow-up in 2 weeks

## 2021-11-12 NOTE — Assessment & Plan Note (Signed)
Cerumen impaction present on left side.  Patient states that ear feels clogged at times. A/P: Follow-up in 2 weeks for ear cleaning

## 2021-11-12 NOTE — Assessment & Plan Note (Signed)
Jeffrey [name] presents with intermittent nasal congestion.  He states that he takes an over the counter antihistamine when symptoms worsen and he also uses Flonase.  He has noticed that his ears feel "clogged" when his nose is stuffed up and he has difficulty hearing. A/P: Symptoms consistent with allergic rhinitis and possible eustachian tube dysfunction. -Claritin daily - Flonase daily -f/u in 2 weeks

## 2021-11-13 ENCOUNTER — Encounter (HOSPITAL_COMMUNITY): Payer: Self-pay

## 2021-11-13 ENCOUNTER — Other Ambulatory Visit: Payer: Self-pay

## 2021-11-13 ENCOUNTER — Emergency Department (HOSPITAL_COMMUNITY)
Admission: EM | Admit: 2021-11-13 | Discharge: 2021-11-13 | Disposition: A | Discharge status: Home / Self Care | Payer: Medicare Other | Attending: Emergency Medicine | Admitting: Emergency Medicine

## 2021-11-13 DIAGNOSIS — F0390 Unspecified dementia without behavioral disturbance: Secondary | ICD-10-CM | POA: Diagnosis not present

## 2021-11-13 DIAGNOSIS — Z85828 Personal history of other malignant neoplasm of skin: Secondary | ICD-10-CM | POA: Insufficient documentation

## 2021-11-13 DIAGNOSIS — Z7401 Bed confinement status: Secondary | ICD-10-CM | POA: Diagnosis not present

## 2021-11-13 DIAGNOSIS — Z8673 Personal history of transient ischemic attack (TIA), and cerebral infarction without residual deficits: Secondary | ICD-10-CM | POA: Diagnosis not present

## 2021-11-13 DIAGNOSIS — M2550 Pain in unspecified joint: Secondary | ICD-10-CM | POA: Diagnosis not present

## 2021-11-13 DIAGNOSIS — K5900 Constipation, unspecified: Secondary | ICD-10-CM | POA: Diagnosis not present

## 2021-11-13 DIAGNOSIS — R195 Other fecal abnormalities: Secondary | ICD-10-CM | POA: Diagnosis present

## 2021-11-13 DIAGNOSIS — E871 Hypo-osmolality and hyponatremia: Secondary | ICD-10-CM | POA: Diagnosis not present

## 2021-11-13 DIAGNOSIS — Z743 Need for continuous supervision: Secondary | ICD-10-CM | POA: Diagnosis not present

## 2021-11-13 LAB — CBC
HCT: 40.3 % (ref 39.0–52.0)
Hemoglobin: 13.9 g/dL (ref 13.0–17.0)
MCH: 31.7 pg (ref 26.0–34.0)
MCHC: 34.5 g/dL (ref 30.0–36.0)
MCV: 92 fL (ref 80.0–100.0)
Platelets: 162 10*3/uL (ref 150–400)
RBC: 4.38 MIL/uL (ref 4.22–5.81)
RDW: 12.1 % (ref 11.5–15.5)
WBC: 5.3 10*3/uL (ref 4.0–10.5)
nRBC: 0 % (ref 0.0–0.2)

## 2021-11-13 LAB — COMPREHENSIVE METABOLIC PANEL
ALT: 20 U/L (ref 0–44)
AST: 27 U/L (ref 15–41)
Albumin: 3.6 g/dL (ref 3.5–5.0)
Alkaline Phosphatase: 67 U/L (ref 38–126)
Anion gap: 10 (ref 5–15)
BUN: 13 mg/dL (ref 8–23)
CO2: 22 mmol/L (ref 22–32)
Calcium: 8.9 mg/dL (ref 8.9–10.3)
Chloride: 105 mmol/L (ref 98–111)
Creatinine, Ser: 1.09 mg/dL (ref 0.61–1.24)
GFR, Estimated: >60 mL/min (ref >60)
Glucose, Bld: 107 mg/dL — ABNORMAL HIGH (ref 70–99)
Potassium: 4 mmol/L (ref 3.5–5.1)
Sodium: 137 mmol/L (ref 135–145)
Total Bilirubin: 0.8 mg/dL (ref 0.3–1.2)
Total Protein: 6 g/dL — ABNORMAL LOW (ref 6.5–8.1)

## 2021-11-13 MED ORDER — DOCUSATE SODIUM 100 MG PO CAPS: 100.0000 mg | ORAL_CAPSULE | Freq: Two times a day (BID) | ORAL | 0 refills | Status: DC

## 2021-11-13 MED ORDER — POLYETHYLENE GLYCOL 3350 17 GM/SCOOP PO POWD: 1.0000 | Freq: Once | ORAL | 0 refills | Status: AC

## 2021-11-13 MED ORDER — SENNOSIDES-DOCUSATE SODIUM 8.6-50 MG PO TABS: 2.0000 | ORAL_TABLET | Freq: Two times a day (BID) | ORAL | 0 refills | Status: AC

## 2021-11-13 NOTE — Progress Notes (Signed)
CSW spoke with legal guardian Linna Darner with [facility] who stated to discharge patient home.

## 2021-11-13 NOTE — ED Provider Notes (Signed)
[facility] EMERGENCY DEPARTMENT Provider Note   CSN: [id number] Arrival date & time: 11/13/21  0748     History  Chief Complaint  Patient presents with   Stool Impaction    Jeffrey [name] is a 88 y.o. male.  HPI     88 year old male with a history of dementia, BPH, diverticulosis, hemorrhoids, colon polyps, TIA, presents with concern for constipation.  Jeffrey [name] reports that his PCP took him off a bunch of his medications and Jeffrey [name] has been having to use his finger to take out stool.  Has had BM with last last night but was dry and hard and Jeffrey [name] had to use his finger to help it along.  Jeffrey [name] has had BM over the last 2 weeks but has had to use his fingers at time to help and that the stool has been hard and dry.  Jeffrey [name] reports a medication was stopped by his dr a few weeks ago. Jeffrey [name] did not have these problems previously.  Jeffrey [name] denies having abdominal pain, fever, urinary symptoms, nausea, vomiting.  Reports that Jeffrey [name] is eating and drinking normally.  Jeffrey [name] is passing some flatus.  Reports that because Jeffrey [name] disimpacted himself last night, Jeffrey [name] does not have any more stool in his rectum and declines a rectal exam.  Jeffrey [name] reports that Jeffrey [name] has taken stool softeners and MiraLAX, although it is unclear how much.  Jeffrey [name] reports that Jeffrey [name] is taking all of his medications as prescribed.  His wife died in 12-24-18 and reports that Jeffrey [name] has not been eating the same since then, but does eat chicken and fish sandwiches every day.  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 History of dementia and does repeat portions of history but is able to answer questions.   Reviewed PCP note from a few weeks ago where Jeffrey [name] was diagnosed with allergic rhinitis and started on Claritin daily and his pantoprazole was stopped  Past Medical History:  Diagnosis Date   Adenomatous polyp of colon 1995    w/ HGD   Advance care planning 12/29/2013   Advance directive- Sister [place] designated  patient were incapacitated.   Allergic rhinitis    Bilateral cold feet 03/07/2020   BPH (benign prostatic hyperplasia)    COLONIC POLYPS, HX OF 08/24/2006   Qualifier: Diagnosis of  By: Council Mechanic MD, Hilaria Ota    Diverticulosis 09/21/2000   DIVERTICULOSIS, COLON 09/21/2000   Qualifier: Diagnosis of  By: Council Mechanic MD, Hilaria Ota    Dizziness 03/07/2020   ED (erectile dysfunction)    ED (erectile dysfunction) 11/14/2010   EXTERNAL HEMORRHOIDS 05/02/2010   Qualifier: Diagnosis of  By: Damita Dunnings MD, Phillip Heal     Hyperlipemia 05/05/1988   HYPERTROPHY PROSTATE W/UR OBST & OTH LUTS 10/09/2009   Pt uninterested in further testing.    Hypokalemia 05/11/2020   Hyponatremia 05/11/2020   Infected sebaceous cyst 01/25/2020   Knee pain 08/13/2011   Lung nodule 02/03/2019   Medicare annual wellness visit, subsequent 12/22/2011   Memory change 01/03/2016   Memory loss    Nasal sinus congestion 03/08/2019   Paresthesia of foot 08/13/2011   Rectal bleeding 09/26/2019   Schamberg's purpura    2012- eval by Dr. Allyson Sabal   Sebaceous cyst    Shoulder pain, right 08/24/2016   Skin cancer 05/05/1998   hx of Scalp-melanoma   TIA (transient ischemic attack)     Home Medications Prior to Admission medications   Medication  Sig Start Date End Date Taking? Authorizing Provider  docusate sodium (COLACE) 100 MG capsule Take 1 capsule (100 mg total) by mouth every 12 (twelve) hours. 11/13/21  Yes Gareth Morgan, MD  polyethylene glycol powder (MIRALAX) 17 GM/SCOOP powder Take 255 g by mouth once for 1 dose. Use 1-2 scoops per day.  May increase to up to 4 scoops in a 32oz bottle of gatorade or similar for severe constipation for one day, followed by 3 scoops the next day, 2 scoops the next day, and return to 1 scoop daily. 11/13/21 11/13/21 Yes Skarlett Sedlacek, Junie Panning, MD  senna-docusate (SENOKOT-S) 8.6-50 MG tablet Take 2 tablets by mouth 2 (two) times daily for 14 days. 11/13/21 11/27/21 Yes Gareth Morgan, MD  citalopram (CELEXA) 10 MG tablet  Take 1 tablet (10 mg total) by mouth daily. 10/09/21   Suzzanne Cloud, NP  donepezil (ARICEPT) 10 MG tablet Take 1 tablet (10 mg total) by mouth at bedtime. 10/09/21   Suzzanne Cloud, NP  fluticasone (FLONASE) 50 MCG/ACT nasal spray Place 1 spray into both nostrils as needed.    [provider]  loratadine (CLARITIN) 10 MG tablet Take 10 mg by mouth as needed.    [provider]  memantine (NAMENDA) 10 MG tablet Take 1 tablet (10 mg total) by mouth 2 (two) times daily. 10/09/21   Suzzanne Cloud, NP  Multiple Vitamin (MULTIVITAMIN) tablet Take 1 tablet by mouth daily.    [provider]      Allergies    Flonase [fluticasone propionate] and Tamsulosin    Review of Systems   Review of Systems  Physical Exam Updated Vital Signs BP 130/72   Pulse 64   Temp 98.1 F (36.7 C) (Oral)   Resp 15   Ht '5\' 7"'$  (1.702 m)   Wt 64.4 kg   SpO2 98%   BMI 22.24 kg/m  Physical Exam Vitals and nursing note reviewed.  Constitutional:      General: Jeffrey [name] is not in acute distress.    Appearance: Jeffrey [name] is well-developed. Jeffrey [name] is not diaphoretic.  HENT:     Head: Normocephalic and atraumatic.  Eyes:     Conjunctiva/sclera: Conjunctivae normal.  Cardiovascular:     Rate and Rhythm: Normal rate and regular rhythm.     Heart sounds: Normal heart sounds. No murmur heard.    No friction rub. No gallop.  Pulmonary:     Effort: Pulmonary effort is normal. No respiratory distress.     Breath sounds: Normal breath sounds. No wheezing or rales.  Abdominal:     General: There is no distension.     Palpations: Abdomen is soft.     Tenderness: There is no abdominal tenderness. There is no guarding.  Musculoskeletal:     Cervical back: Normal range of motion.  Skin:    General: Skin is warm and dry.  Neurological:     Mental Status: Jeffrey [name] is alert and oriented to person, place, and time.     ED Results / Procedures / Treatments   Labs (all labs ordered are listed, but only abnormal results  are displayed) Labs Reviewed  COMPREHENSIVE METABOLIC PANEL - Abnormal; Notable for the following components:      Result Value   Glucose, Bld 107 (*)    Total Protein 6.0 (*)    All other components within normal limits  CBC    EKG None  Radiology No results found.  Procedures Procedures    Medications Ordered in ED Medications -  No data to display  ED Course/ Medical Decision Making/ A&P                           Medical Decision Making Amount and/or Complexity of Data Reviewed Labs: ordered.   88 year old male with a history of dementia, BPH, diverticulosis, hemorrhoids, colon polyps, TIA, presents with concern for constipation.   Labs obtained and personally evaluated interpreted by me show no significant electrolyte abnormalities to explain worsening constipation, normal renal function, normal white blood cell count and no anemia.  Jeffrey [name] denies any abdominal or rectal pain, and has no abdominal tenderness on exam, no nausea or vomiting, and have low suspicion for bowel obstruction, or other acute surgical emergency.  Jeffrey [name] denies any rectal pain and declines rectal exam is Jeffrey [name] reports Jeffrey [name] disimpacted himself last night and no longer has the sensation of stool in his rectum.  Jeffrey [name] is concerned regarding stool that is more hard and dry than usual.  Is unclear if Jeffrey [name] had been on Claritin prior to this, but consider this possible etiology of his new worsening constipation.  Discussed we can increase his bowel regimen.  Wrote a prescription for Senokot, and MiraLAX, with instructions to increase MiraLAX to up to 4 caps in 132 ounce bottle of Gatorade if Jeffrey [name] is having severe constipation.  Attempted to call family, however was not able to get in touch.  Discussed with patient that I recommend following up with his primary care doctor and returning to the emergency department if any new or concerning symptoms.         Final Clinical Impression(s) / ED Diagnoses Final diagnoses:   Constipation, unspecified constipation type    Rx / DC Orders ED Discharge Orders          Ordered    docusate sodium (COLACE) 100 MG capsule  Every 12 hours        11/13/21 0914    senna-docusate (SENOKOT-S) 8.6-50 MG tablet  2 times daily        11/13/21 0914    polyethylene glycol powder (MIRALAX) 17 GM/SCOOP powder   Once        11/13/21 1610              Gareth Morgan, MD 11/13/21 [phone]

## 2021-11-13 NOTE — ED Triage Notes (Addendum)
Pt arrived from home via [facility] w/ c/o constipation x 2 weeks after PCP took him off a "bunch" of his meds. Pt has been having to use his finger to dig the stool out per pt. EMS reports pt A&Ox4, but confused. EMS reports pt is taking namenda. VSS w/ EMS  Pt is oriented x 3 and disoriented to time

## 2021-11-13 NOTE — ED Notes (Signed)
Per Raina Mina, LCSWA, the legal guardian was in contact with her and acknowledges that pt is here and that it is ok to D/C him and arrange transport via [facility].

## 2021-11-13 NOTE — ED Notes (Signed)
Pt unable to tell me what meds he has been taken off of. PTA meds don't show any stool softeners.

## 2021-11-13 NOTE — ED Notes (Signed)
[facility] called for pt transport. 

## 2021-11-26 ENCOUNTER — Ambulatory Visit (INDEPENDENT_AMBULATORY_CARE_PROVIDER_SITE_OTHER): Payer: Medicare Other | Admitting: Internal Medicine

## 2021-11-26 VITALS — BP 136/61 | HR 61 | Wt 146.6 lb

## 2021-11-26 DIAGNOSIS — H6123 Impacted cerumen, bilateral: Secondary | ICD-10-CM | POA: Diagnosis not present

## 2021-11-26 DIAGNOSIS — K5909 Other constipation: Secondary | ICD-10-CM

## 2021-11-26 DIAGNOSIS — F03B3 Unspecified dementia, moderate, with mood disturbance: Secondary | ICD-10-CM

## 2021-11-26 DIAGNOSIS — Z7189 Other specified counseling: Secondary | ICD-10-CM | POA: Diagnosis not present

## 2021-11-26 DIAGNOSIS — Z0000 Encounter for general adult medical examination without abnormal findings: Secondary | ICD-10-CM

## 2021-11-26 MED ORDER — DEBROX 6.5 % OT SOLN: 5.0000 [drp] | Freq: Two times a day (BID) | OTIC | 0 refills | Status: AC

## 2021-11-26 MED ORDER — POLYETHYLENE GLYCOL 3350 17 G PO PACK: 17.0000 g | PACK | Freq: Every day | ORAL | 0 refills | Status: AC

## 2021-11-26 NOTE — Patient Instructions (Signed)
Thank you, [name] for allowing [place] to provide your care today. Today we discussed:  Ear wax- if it feels like your ears are getting full again then please try Debrox. This softens the wax.  Medications- I am happy to send medications to a pharmacy that can do pill packs or to mail order pharmacy. We could also do home health with RN to provide more support.  Zoster- I gave you a hand out about the shingles vaccine.    I have ordered the following labs for you:  Lab Orders  No laboratory test(s) ordered today     Referrals ordered today:   Referral Orders  No referral(s) requested today     I have ordered the following medication/changed the following medications:   Stop the following medications: There are no discontinued medications.   Start the following medications: Meds ordered this encounter  Medications   polyethylene glycol (MIRALAX / GLYCOLAX) 17 g packet    Sig: Take 17 g by mouth daily.    Dispense:  14 each    Refill:  0   carbamide peroxide (DEBROX) 6.5 % OTIC solution    Sig: Place 5 drops into both ears 2 (two) times daily.    Dispense:  15 mL    Refill:  0     Follow up: 2-3 months   We look forward to seeing you next time. Please call our clinic at [phone] if you have any questions or concerns. The best time to call is Monday-Friday from 9am-4pm, but there is someone available 24/7. If after hours or the weekend, call the main hospital number and ask for the Internal Medicine Resident On-Call. If you need medication refills, please notify your pharmacy one week in advance and they will send [place] a request.   Thank you for trusting me with your care. Wishing you the best!   Christiana Fuchs, [place]

## 2021-11-26 NOTE — Progress Notes (Unsigned)
Subjective:  CC: clogged ears  HPI:  Jeffrey [name] is a 88 y.o. male with a past medical history stated below and presents today for cerumen impaction and to discuss solutions for getting medications to patient with his license being taking away in the near future. Please see problem based assessment and plan for additional details.  Past Medical History:  Diagnosis Date   Adenomatous polyp of colon 1995    w/ HGD   Advance care planning 12/29/2013   Advance directive- Sister [place] designated patient were incapacitated.   Allergic rhinitis    Bilateral cold feet 03/07/2020   BPH (benign prostatic hyperplasia)    COLONIC POLYPS, HX OF 08/24/2006   Qualifier: Diagnosis of  By: Council Mechanic MD, Hilaria Ota    Diverticulosis 09/21/2000   DIVERTICULOSIS, COLON 09/21/2000   Qualifier: Diagnosis of  By: Council Mechanic MD, Hilaria Ota    Dizziness 03/07/2020   ED (erectile dysfunction)    ED (erectile dysfunction) 11/14/2010   EXTERNAL HEMORRHOIDS 05/02/2010   Qualifier: Diagnosis of  By: Damita Dunnings MD, Phillip Heal     Hyperlipemia 05/05/1988   HYPERTROPHY PROSTATE W/UR OBST & OTH LUTS 10/09/2009   Pt uninterested in further testing.    Hypokalemia 05/11/2020   Hyponatremia 05/11/2020   Infected sebaceous cyst 01/25/2020   Knee pain 08/13/2011   Lung nodule 02/03/2019   Medicare annual wellness visit, subsequent 12/22/2011   Memory change 01/03/2016   Memory loss    Nasal sinus congestion 03/08/2019   Paresthesia of foot 08/13/2011   Rectal bleeding 09/26/2019   Schamberg's purpura    2012- eval by Dr. Allyson Sabal   Sebaceous cyst    Shoulder pain, right 08/24/2016   Skin cancer 05/05/1998   hx of Scalp-melanoma   TIA (transient ischemic attack)     Current Outpatient Medications on File Prior to Visit  Medication Sig Dispense Refill   citalopram (CELEXA) 10 MG tablet Take 1 tablet (10 mg total) by mouth daily. 90 tablet 1   docusate sodium (COLACE) 100 MG capsule Take 1 capsule (100 mg total)  by mouth every 12 (twelve) hours. 60 capsule 0   donepezil (ARICEPT) 10 MG tablet Take 1 tablet (10 mg total) by mouth at bedtime. 90 tablet 3   fluticasone (FLONASE) 50 MCG/ACT nasal spray Place 1 spray into both nostrils as needed.     loratadine (CLARITIN) 10 MG tablet Take 10 mg by mouth as needed.     memantine (NAMENDA) 10 MG tablet Take 1 tablet (10 mg total) by mouth 2 (two) times daily. 180 tablet 3   Multiple Vitamin (MULTIVITAMIN) tablet Take 1 tablet by mouth daily.     senna-docusate (SENOKOT-S) 8.6-50 MG tablet Take 2 tablets by mouth 2 (two) times daily for 14 days. 56 tablet 0   No current facility-administered medications on file prior to visit.    Family History  Problem Relation Age of Onset   Rectal cancer Father    Colon cancer Paternal Aunt    Other Paternal Aunt        cancer on back of neck   Prostate cancer Neg Hx     Social History   Socioeconomic History   Marital status: Widowed    Spouse name: Not on file   Number of children: 1   Years of education: Not on file   Highest education level: Bachelor's degree (e.g., BA, AB, BS)  Occupational History   Occupation: retired-  lab [employer]  Employer: RETIRED  Tobacco Use   Smoking status: Former    Years: 5.00    Types: Cigarettes    Quit date: 05/05/1958    Years since quitting: 63.6   Smokeless tobacco: Never  Vaping Use   Vaping Use: Never used  Substance and Sexual Activity   Alcohol use: No   Drug use: No   Sexual activity: Not on file  Other Topics Concern   Not on file  Social History Narrative   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    Artist- paints/charcoal   Left handed   Social Determinants of Health   Financial Resource Strain: Low Risk  (01/28/2019)   Overall Financial Resource Strain (CARDIA)     Difficulty of Paying Living Expenses: Not hard at all  Food Insecurity: No Food Insecurity (01/28/2019)   Hunger Vital Sign    Worried About Running Out of Food in the Last Year: Never true    Ran Out of Food in the Last Year: Never true  Transportation Needs: No Transportation Needs (01/28/2019)   PRAPARE - Hydrologist (Medical): No    Lack of Transportation (Non-Medical): No  Physical Activity: Insufficiently Active (01/28/2019)   Exercise Vital Sign    Days of Exercise per Week: 7 days    Minutes of Exercise per Session: 10 min  Stress: No Stress Concern Present (01/28/2019)   [place]    Feeling of Stress : Not at all  Social Connections: Not on file  Intimate Partner Violence: Not At Risk (01/28/2019)   Humiliation, Afraid, Rape, and Kick questionnaire    Fear of Current or Ex-Partner: No    Emotionally Abused: No    Physically Abused: No    Sexually Abused: No    Review of Systems: ROS negative except for what is noted on the assessment and plan.  Objective:   Vitals:   11/26/21 0937  BP: 136/61  Pulse: 61  SpO2: 97%  Weight: 146 lb 9.6 oz (66.5 kg)    Physical Exam: Constitutional: well-appearing, in no acute distress Cardiovascular: regular rate and rhythm, no m/r/g Pulmonary/Chest: normal work of breathing on room air, lungs clear to auscultation bilaterally Abdominal: soft, non-tender, non-distended MSK: normal bulk and tone Neurological: alert & oriented to self and place, normal gait Skin: warm and dry Psych: normal mood and affect     Assessment & Plan:  Cerumen impaction Patient presents with cerumen impaction bilaterally.  He reports that fullness in ears with decreased hearing.  Ear canals were gently irrigated with water and cerumen was easily removed.  Chronic constipation Patient presented to ED due to constipation.  He states that he takes MiraLAX as  needed.  He is not able to report how often he has bowel movements.  He states that it is somewhat difficult to have a bowel movement. A/P: Daily MiraLAX  Healthcare maintenance We discussed getting shingles vaccine.  He is not interested at this time.  He was given a handout from the CDC to learn more about this vaccine.  Goals of care, counseling/discussion Mr. Gomillion is a 88 year old with mild dementia who lives alone.  His social worker with [facility] is currently working through options for him.  He is  driving to restaurants daily, to get groceries, and his mail is sent to [facility] rather than his house.  At this time DMV is moving towards taking away his license.  Cheri states that she is working on getting a Publishing rights manager up to his house.  For his medications we could consider using pill packets with mail order however his mail does not come to his house anymore.  The other option would be to have a pharmacy deliver, but they typically call ahead and Mr. Morua does not answer his phone. A/P: I will talk about case with chronic care management to see if they have any additional solutions.   Patient discussed with Dr. Walden Field Melven Stockard, D.O. [facility]  PGY-2 Pager: [phone]  Phone: [phone] Date 11/27/2021  Time 6:20 AM

## 2021-11-27 DIAGNOSIS — Z0000 Encounter for general adult medical examination without abnormal findings: Secondary | ICD-10-CM | POA: Insufficient documentation

## 2021-11-27 NOTE — Assessment & Plan Note (Signed)
We discussed getting shingles vaccine.  He is not interested at this time.  He was given a handout from the CDC to learn more about this vaccine.

## 2021-11-27 NOTE — Assessment & Plan Note (Signed)
Jeffrey [name] is a 88 year old with mild dementia who lives alone.  His social worker with [facility] is currently working through options for him.  He is driving to restaurants daily, to get groceries, and his mail is sent to [facility] rather than his house.  At this time DMV is moving towards taking away his license.  Cheri states that she is working on getting a Publishing rights manager up to his house.  For his medications we could consider using pill packets with mail order however his mail does not come to his house anymore.  The other option would be to have a pharmacy deliver, but they typically call ahead and [name] does not answer his phone. A/P: I will talk about case with chronic care management to see if they have any additional solutions.

## 2021-11-27 NOTE — Assessment & Plan Note (Signed)
Patient presented to ED due to constipation.  He states that he takes MiraLAX as needed.  He is not able to report how often he has bowel movements.  He states that it is somewhat difficult to have a bowel movement. A/P: Daily MiraLAX

## 2021-11-27 NOTE — Assessment & Plan Note (Signed)
Patient presents with cerumen impaction bilaterally.  He reports that fullness in ears with decreased hearing.  Ear canals were gently irrigated with water and cerumen was easily removed.

## 2021-11-28 ENCOUNTER — Telehealth: Payer: Self-pay | Admitting: *Deleted

## 2021-11-28 NOTE — Chronic Care Management (AMB) (Signed)
  Care Coordination  Note  11/28/2021 Name: AGUSTINE ROSSITTO MRN: [id number] DOB: [birth year]  Loel Ro is a 88 y.o. year old male who is a primary care patient of Masters, Joellen Jersey, DO. I reached out to Loel Ro by phone today to offer care coordination services.      Mr. Slone was given information about Care Coordination services today including:  The Care Coordination services include support from the care team which includes your Nurse Coordinator, Clinical Social Worker, or Pharmacist.  The Care Coordination team is here to help remove barriers to the health concerns and goals most important to you. Care Coordination services are voluntary and the patient may decline or stop services at any time by request to their care team member.   Patient guardian Venida Jarvis did not agree to participate in care coordination services at this time.  Care guides emailed a list of pharmacies that have pill packs and delivery medications to guardian Sherri. Pharmacy Name Location Phone Number Type of package Free Service Free Delivery  [facility] [phone] Presorted tear-off pill packs     [facility] [phone]     [facility] [place] [phone]     [place] counties Use Amazon Account Presorted tear off pill packs   For Prime members  [place] ([place] [phone] Surecard [employer] (QID x 7 days)  $3 / home Free for [employer] ([place]) [place] [phone] Weekly QID    [place] ([place]) [place] [phone]) [phone] Dispill (QID x 7 days)   [facility] [phone] Dispill (QID x 7 days)   [place] [phone]) [phone] Dispill (QID x 7 days) $5   [facility] [place]) [phone] Dispill (QID x 7 days)    [place] [phone] SureCard Omnicell (QID x 7 days); OR [address] [facility] [place] [phone] [facility] [phone] 30 day pack or Surecell weekly QID    [facility] ([place]) [phone] Monthly (BID) or weekly (QID)    [facility] [place] ([place]) [phone] Blister cards $1 per card   [place] ([place]) [phone] Dispill (QID x 7 day) and blister packs    [facility]) [phone] Surecard Om[employer]ell (QID x 7 days)  and Blister Packs $10/ month  [facility] [place]) [phone] Single medication or multi-dose cards  $5 / month   [place]) [phone] Dispill (QID x 7 days) $10/month    [place]  Direct Dial: [phone]

## 2021-12-02 NOTE — Progress Notes (Signed)
Internal Medicine Clinic Attending  Case discussed with Dr. Masters  at the time of the visit.  We reviewed the resident's history and exam and pertinent patient test results.  I agree with the assessment, diagnosis, and plan of care documented in the resident's note.  

## 2021-12-03 NOTE — Progress Notes (Signed)
Internal Medicine Clinic Attending  Case discussed with Dr. Masters  At the time of the visit.  We reviewed the resident's history and exam and pertinent patient test results.  I agree with the assessment, diagnosis, and plan of care documented in the resident's note.  

## 2021-12-04 ENCOUNTER — Other Ambulatory Visit: Payer: Self-pay

## 2021-12-04 MED ORDER — LORATADINE 10 MG PO TABS
10.0000 mg | ORAL_TABLET | ORAL | 2 refills | Status: DC | PRN
Start: 2021-12-04 — End: 2022-01-21

## 2021-12-20 ENCOUNTER — Telehealth: Payer: Self-pay

## 2021-12-20 NOTE — Telephone Encounter (Signed)
0937- spoke with Linna Darner, legal guardian. Reports that "patient's confusion has gotten much worse and he doesn't really trust anyone after events that occurred in the past." She told me that he had changed primary providers and insurance providers in the not so distant past and does not see a need at this time for him to be followed by palliative care.

## 2022-01-20 ENCOUNTER — Other Ambulatory Visit: Payer: Self-pay | Admitting: Family Medicine

## 2022-02-19 ENCOUNTER — Encounter: Payer: Medicare Other | Admitting: Student

## 2022-02-26 DIAGNOSIS — F0390 Unspecified dementia without behavioral disturbance: Secondary | ICD-10-CM | POA: Diagnosis not present

## 2022-02-26 DIAGNOSIS — K5900 Constipation, unspecified: Secondary | ICD-10-CM | POA: Diagnosis not present

## 2022-02-26 DIAGNOSIS — E785 Hyperlipidemia, unspecified: Secondary | ICD-10-CM | POA: Diagnosis not present

## 2022-02-26 DIAGNOSIS — F419 Anxiety disorder, unspecified: Secondary | ICD-10-CM | POA: Diagnosis not present

## 2022-03-03 ENCOUNTER — Inpatient Hospital Stay (HOSPITAL_COMMUNITY)
Admission: EM | Admit: 2022-03-03 | Discharge: 2022-03-07 | DRG: 083 | Disposition: A | Discharge status: Home Health | Payer: Medicare Other | Source: Skilled Nursing Facility | Attending: Internal Medicine | Admitting: Internal Medicine

## 2022-03-03 ENCOUNTER — Emergency Department (HOSPITAL_COMMUNITY): Payer: Medicare Other

## 2022-03-03 ENCOUNTER — Encounter (HOSPITAL_COMMUNITY): Payer: Self-pay

## 2022-03-03 ENCOUNTER — Other Ambulatory Visit: Payer: Self-pay

## 2022-03-03 DIAGNOSIS — E876 Hypokalemia: Secondary | ICD-10-CM | POA: Diagnosis present

## 2022-03-03 DIAGNOSIS — Z743 Need for continuous supervision: Secondary | ICD-10-CM | POA: Diagnosis not present

## 2022-03-03 DIAGNOSIS — S2232XA Fracture of one rib, left side, initial encounter for closed fracture: Secondary | ICD-10-CM | POA: Diagnosis present

## 2022-03-03 DIAGNOSIS — F0390 Unspecified dementia without behavioral disturbance: Secondary | ICD-10-CM | POA: Diagnosis present

## 2022-03-03 DIAGNOSIS — N138 Other obstructive and reflux uropathy: Secondary | ICD-10-CM | POA: Diagnosis not present

## 2022-03-03 DIAGNOSIS — S7012XA Contusion of left thigh, initial encounter: Secondary | ICD-10-CM | POA: Diagnosis present

## 2022-03-03 DIAGNOSIS — R7989 Other specified abnormal findings of blood chemistry: Secondary | ICD-10-CM | POA: Diagnosis not present

## 2022-03-03 DIAGNOSIS — S2239XA Fracture of one rib, unspecified side, initial encounter for closed fracture: Secondary | ICD-10-CM | POA: Diagnosis not present

## 2022-03-03 DIAGNOSIS — N401 Enlarged prostate with lower urinary tract symptoms: Secondary | ICD-10-CM | POA: Diagnosis present

## 2022-03-03 DIAGNOSIS — Z800 Family history of malignant neoplasm of digestive organs: Secondary | ICD-10-CM

## 2022-03-03 DIAGNOSIS — Z79899 Other long term (current) drug therapy: Secondary | ICD-10-CM

## 2022-03-03 DIAGNOSIS — E440 Moderate protein-calorie malnutrition: Secondary | ICD-10-CM | POA: Diagnosis present

## 2022-03-03 DIAGNOSIS — Z87891 Personal history of nicotine dependence: Secondary | ICD-10-CM

## 2022-03-03 DIAGNOSIS — Z85820 Personal history of malignant melanoma of skin: Secondary | ICD-10-CM | POA: Diagnosis not present

## 2022-03-03 DIAGNOSIS — Z9181 History of falling: Secondary | ICD-10-CM

## 2022-03-03 DIAGNOSIS — J929 Pleural plaque without asbestos: Secondary | ICD-10-CM | POA: Diagnosis not present

## 2022-03-03 DIAGNOSIS — S7002XA Contusion of left hip, initial encounter: Secondary | ICD-10-CM | POA: Diagnosis present

## 2022-03-03 DIAGNOSIS — Z66 Do not resuscitate: Secondary | ICD-10-CM | POA: Diagnosis not present

## 2022-03-03 DIAGNOSIS — Z515 Encounter for palliative care: Secondary | ICD-10-CM

## 2022-03-03 DIAGNOSIS — W19XXXA Unspecified fall, initial encounter: Secondary | ICD-10-CM | POA: Diagnosis present

## 2022-03-03 DIAGNOSIS — R296 Repeated falls: Secondary | ICD-10-CM | POA: Diagnosis not present

## 2022-03-03 DIAGNOSIS — Z8673 Personal history of transient ischemic attack (TIA), and cerebral infarction without residual deficits: Secondary | ICD-10-CM

## 2022-03-03 DIAGNOSIS — M545 Low back pain, unspecified: Secondary | ICD-10-CM | POA: Diagnosis present

## 2022-03-03 DIAGNOSIS — S065X0A Traumatic subdural hemorrhage without loss of consciousness, initial encounter: Secondary | ICD-10-CM | POA: Diagnosis not present

## 2022-03-03 DIAGNOSIS — E785 Hyperlipidemia, unspecified: Secondary | ICD-10-CM | POA: Diagnosis present

## 2022-03-03 DIAGNOSIS — Z86010 Personal history of colonic polyps: Secondary | ICD-10-CM

## 2022-03-03 DIAGNOSIS — K5909 Other constipation: Secondary | ICD-10-CM | POA: Diagnosis present

## 2022-03-03 DIAGNOSIS — Z23 Encounter for immunization: Secondary | ICD-10-CM | POA: Diagnosis not present

## 2022-03-03 DIAGNOSIS — F03B3 Unspecified dementia, moderate, with mood disturbance: Secondary | ICD-10-CM | POA: Diagnosis not present

## 2022-03-03 DIAGNOSIS — D649 Anemia, unspecified: Secondary | ICD-10-CM | POA: Diagnosis present

## 2022-03-03 DIAGNOSIS — S2242XA Multiple fractures of ribs, left side, initial encounter for closed fracture: Secondary | ICD-10-CM | POA: Diagnosis not present

## 2022-03-03 DIAGNOSIS — R9431 Abnormal electrocardiogram [ECG] [EKG]: Secondary | ICD-10-CM | POA: Diagnosis not present

## 2022-03-03 DIAGNOSIS — Z888 Allergy status to other drugs, medicaments and biological substances status: Secondary | ICD-10-CM

## 2022-03-03 DIAGNOSIS — E860 Dehydration: Secondary | ICD-10-CM | POA: Diagnosis present

## 2022-03-03 DIAGNOSIS — R6889 Other general symptoms and signs: Secondary | ICD-10-CM | POA: Diagnosis not present

## 2022-03-03 DIAGNOSIS — S065XAA Traumatic subdural hemorrhage with loss of consciousness status unknown, initial encounter: Secondary | ICD-10-CM | POA: Diagnosis not present

## 2022-03-03 DIAGNOSIS — M4313 Spondylolisthesis, cervicothoracic region: Secondary | ICD-10-CM | POA: Diagnosis not present

## 2022-03-03 DIAGNOSIS — Z6822 Body mass index (BMI) 22.0-22.9, adult: Secondary | ICD-10-CM

## 2022-03-03 DIAGNOSIS — S199XXA Unspecified injury of neck, initial encounter: Secondary | ICD-10-CM | POA: Diagnosis not present

## 2022-03-03 DIAGNOSIS — M2578 Osteophyte, vertebrae: Secondary | ICD-10-CM | POA: Diagnosis not present

## 2022-03-03 DIAGNOSIS — S0990XA Unspecified injury of head, initial encounter: Secondary | ICD-10-CM | POA: Diagnosis not present

## 2022-03-03 DIAGNOSIS — E041 Nontoxic single thyroid nodule: Secondary | ICD-10-CM | POA: Diagnosis present

## 2022-03-03 DIAGNOSIS — M25522 Pain in left elbow: Secondary | ICD-10-CM | POA: Diagnosis not present

## 2022-03-03 DIAGNOSIS — I10 Essential (primary) hypertension: Secondary | ICD-10-CM | POA: Diagnosis not present

## 2022-03-03 DIAGNOSIS — M25519 Pain in unspecified shoulder: Secondary | ICD-10-CM | POA: Diagnosis not present

## 2022-03-03 DIAGNOSIS — R404 Transient alteration of awareness: Secondary | ICD-10-CM | POA: Diagnosis not present

## 2022-03-03 LAB — CBC WITH DIFFERENTIAL/PLATELET
Abs Immature Granulocytes: 0.02 10*3/uL (ref 0.00–0.07)
Basophils Absolute: 0 10*3/uL (ref 0.0–0.1)
Basophils Relative: 1 %
Eosinophils Absolute: 0 10*3/uL (ref 0.0–0.5)
Eosinophils Relative: 1 %
HCT: 34 % — ABNORMAL LOW (ref 39.0–52.0)
Hemoglobin: 11.6 g/dL — ABNORMAL LOW (ref 13.0–17.0)
Immature Granulocytes: 0 %
Lymphocytes Relative: 16 %
Lymphs Abs: 1.2 10*3/uL (ref 0.7–4.0)
MCH: 32 pg (ref 26.0–34.0)
MCHC: 34.1 g/dL (ref 30.0–36.0)
MCV: 93.7 fL (ref 80.0–100.0)
Monocytes Absolute: 0.9 10*3/uL (ref 0.1–1.0)
Monocytes Relative: 13 %
Neutro Abs: 5 10*3/uL (ref 1.7–7.7)
Neutrophils Relative %: 69 %
Platelets: 191 10*3/uL (ref 150–400)
RBC: 3.63 MIL/uL — ABNORMAL LOW (ref 4.22–5.81)
RDW: 13 % (ref 11.5–15.5)
WBC: 7.2 10*3/uL (ref 4.0–10.5)
nRBC: 0 % (ref 0.0–0.2)

## 2022-03-03 LAB — BASIC METABOLIC PANEL
Anion gap: 6 (ref 5–15)
BUN: 25 mg/dL — ABNORMAL HIGH (ref 8–23)
CO2: 21 mmol/L — ABNORMAL LOW (ref 22–32)
Calcium: 8 mg/dL — ABNORMAL LOW (ref 8.9–10.3)
Chloride: 110 mmol/L (ref 98–111)
Creatinine, Ser: 1.01 mg/dL (ref 0.61–1.24)
GFR, Estimated: >60 mL/min (ref >60)
Glucose, Bld: 103 mg/dL — ABNORMAL HIGH (ref 70–99)
Potassium: 3.1 mmol/L — ABNORMAL LOW (ref 3.5–5.1)
Sodium: 137 mmol/L (ref 135–145)

## 2022-03-03 LAB — URINALYSIS, ROUTINE W REFLEX MICROSCOPIC
Bilirubin Urine: NEGATIVE
Glucose, UA: NEGATIVE mg/dL
Hgb urine dipstick: NEGATIVE
Ketones, ur: 20 mg/dL — AB
Leukocytes,Ua: NEGATIVE
Nitrite: NEGATIVE
Protein, ur: NEGATIVE mg/dL
Specific Gravity, Urine: >1.046 — ABNORMAL HIGH (ref 1.005–1.030)
pH: 6 (ref 5.0–8.0)

## 2022-03-03 LAB — POC OCCULT BLOOD, ED: Fecal Occult Bld: NEGATIVE

## 2022-03-03 LAB — CBC
HCT: 31.6 % — ABNORMAL LOW (ref 39.0–52.0)
Hemoglobin: 10.8 g/dL — ABNORMAL LOW (ref 13.0–17.0)
MCH: 32 pg (ref 26.0–34.0)
MCHC: 34.2 g/dL (ref 30.0–36.0)
MCV: 93.5 fL (ref 80.0–100.0)
Platelets: 184 10*3/uL (ref 150–400)
RBC: 3.38 MIL/uL — ABNORMAL LOW (ref 4.22–5.81)
RDW: 13.2 % (ref 11.5–15.5)
WBC: 8.5 10*3/uL (ref 4.0–10.5)
nRBC: 0 % (ref 0.0–0.2)

## 2022-03-03 LAB — RETICULOCYTES
Immature Retic Fract: 27.3 % — ABNORMAL HIGH (ref 2.3–15.9)
RBC.: 3.6 MIL/uL — ABNORMAL LOW (ref 4.22–5.81)
Retic Count, Absolute: 95.4 10*3/uL (ref 19.0–186.0)
Retic Ct Pct: 2.7 % (ref 0.4–3.1)

## 2022-03-03 LAB — BLOOD GAS, VENOUS
Acid-base deficit: 1.6 mmol/L (ref 0.0–2.0)
Bicarbonate: 20.9 mmol/L (ref 20.0–28.0)
O2 Saturation: 81.1 %
Patient temperature: 37
pCO2, Ven: 28 mmHg — ABNORMAL LOW (ref 44–60)
pH, Ven: 7.48 — ABNORMAL HIGH (ref 7.25–7.43)
pO2, Ven: 46 mmHg — ABNORMAL HIGH (ref 32–45)

## 2022-03-03 LAB — PROTIME-INR
INR: 1.1 (ref 0.8–1.2)
Prothrombin Time: 14 seconds (ref 11.4–15.2)

## 2022-03-03 LAB — TYPE AND SCREEN
ABO/RH(D): A NEG
Antibody Screen: NEGATIVE

## 2022-03-03 LAB — LACTIC ACID, PLASMA
Lactic Acid, Venous: 3.5 mmol/L (ref 0.5–1.9)
Lactic Acid, Venous: 2.1 mmol/L (ref 0.5–1.9)

## 2022-03-03 LAB — TSH: TSH: 2.008 u[IU]/mL (ref 0.350–4.500)

## 2022-03-03 LAB — FOLATE: Folate: 25.5 ng/mL (ref >5.9)

## 2022-03-03 LAB — HEMOGLOBIN AND HEMATOCRIT, BLOOD
HCT: 32.6 % — ABNORMAL LOW (ref 39.0–52.0)
Hemoglobin: 11 g/dL — ABNORMAL LOW (ref 13.0–17.0)

## 2022-03-03 LAB — IRON AND TIBC
Iron: 39 ug/dL — ABNORMAL LOW (ref 45–182)
Saturation Ratios: 13 % — ABNORMAL LOW (ref 17.9–39.5)
TIBC: 302 ug/dL (ref 250–450)
UIBC: 263 ug/dL

## 2022-03-03 LAB — VITAMIN B12: Vitamin B-12: 911 pg/mL (ref 180–914)

## 2022-03-03 LAB — FERRITIN: Ferritin: 38 ng/mL (ref 24–336)

## 2022-03-03 MED ORDER — IOHEXOL 300 MG/ML  SOLN
100.0000 mL | Freq: Once | INTRAMUSCULAR | Status: AC | PRN
  Administered 2022-03-03: 100 mL via INTRAVENOUS

## 2022-03-03 MED ORDER — ACETAMINOPHEN 325 MG PO TABS
650.0000 mg | ORAL_TABLET | Freq: Once | ORAL | Status: AC
  Administered 2022-03-03: 650 mg via ORAL
  Filled 2022-03-03: qty 2

## 2022-03-03 MED ORDER — POTASSIUM CHLORIDE CRYS ER 20 MEQ PO TBCR
40.0000 meq | EXTENDED_RELEASE_TABLET | Freq: Once | ORAL | Status: AC
  Administered 2022-03-03: 40 meq via ORAL
  Filled 2022-03-03: qty 2

## 2022-03-03 MED ORDER — SODIUM CHLORIDE 0.9 % IV BOLUS
1000.0000 mL | Freq: Once | INTRAVENOUS | Status: AC
  Administered 2022-03-03: 1000 mL via INTRAVENOUS

## 2022-03-03 MED ORDER — SODIUM CHLORIDE (PF) 0.9 % IJ SOLN
INTRAMUSCULAR | Status: AC
  Filled 2022-03-03: qty 50

## 2022-03-03 MED ORDER — INFLUENZA VAC A&B SA ADJ QUAD 0.5 ML IM PRSY
0.5000 mL | PREFILLED_SYRINGE | INTRAMUSCULAR | Status: AC
  Administered 2022-03-05: 0.5 mL via INTRAMUSCULAR
  Filled 2022-03-03 (×2): qty 0.5

## 2022-03-03 NOTE — Assessment & Plan Note (Signed)
Continue pain management incentive spirometer

## 2022-03-03 NOTE — ED Triage Notes (Signed)
Pt coming from [facility] with c/o multiple falls over the last several days. Denies LOC, no blood thinners. Pt reports hitting his head with his fall today. Pt reports that he has been having ongoing dizziness causing his falls. Pt A&Ox3, hx dementia. Bruises visualized to pt's body, pt states that these are from his falls this week.

## 2022-03-03 NOTE — Subjective & Objective (Signed)
Patient transferred from [facility] with multiple falls over past few days not on blood thinners but has hit his head today has been having ongoing dizziness that causes his falls.  He has he has known history of dementia With multiple bruises all over his body no nausea no vomiting but he does think that he is dehydrated.

## 2022-03-03 NOTE — Progress Notes (Signed)
88 year old male with history of dementia.  Patient with a number of recent ground-level falls.  Brought to the emergency room after a fall with blow to his head.  Head CT scan demonstrates evidence of generalized cortical atrophy with bilateral chronic subdural fluid collections which are noncompressive.  Patient with a tiny left frontal subdural hemorrhage of no significance.  No indication for operative intervention.  Recommend follow-up scan.  If scan worsens please call me

## 2022-03-03 NOTE — Assessment & Plan Note (Signed)
Neurosurgery has been consulted plan for repeat CT head in a.m. no indication for surgical intervention at this time neurochecks every 4

## 2022-03-03 NOTE — Assessment & Plan Note (Signed)
Repeat potassium check magnesium and phosphate level

## 2022-03-03 NOTE — Assessment & Plan Note (Signed)
Continue Aricept at 10 mg with nightly

## 2022-03-03 NOTE — Assessment & Plan Note (Addendum)
Will rehydrate and follow, follow serial lactic acid

## 2022-03-03 NOTE — Assessment & Plan Note (Signed)
Obtain anemia panel most likely anemia secondary to hematoma of the thigh.  Continue to follow CBC.  Transfuse as needed for hemoglobin below 7 or rapid drop

## 2022-03-03 NOTE — ED Provider Notes (Signed)
Physical Exam  BP 134/64   Pulse 64   Temp 97.8 F (36.6 C) (Oral)   Resp 14   SpO2 100%   Physical Exam Vitals and nursing note reviewed.  Constitutional:      Appearance: Normal appearance.  HENT:     Head: Normocephalic and atraumatic.  Eyes:     Conjunctiva/sclera: Conjunctivae normal.  Pulmonary:     Effort: Pulmonary effort is normal. No respiratory distress.  Skin:    General: Skin is warm and dry.  Neurological:     Mental Status: He is alert. Mental status is at baseline.     Comments: Oriented to person and place. Pleasantly demented  Psychiatric:        Mood and Affect: Mood normal.        Behavior: Behavior normal.         Results   Results for orders placed or performed during the hospital encounter of 03/03/22  CBC  Result Value Ref Range   WBC 8.5 4.0 - 10.5 K/uL   RBC 3.38 (L) 4.22 - 5.81 MIL/uL   Hemoglobin 10.8 (L) 13.0 - 17.0 g/dL   HCT 31.6 (L) 39.0 - 52.0 %   MCV 93.5 80.0 - 100.0 fL   MCH 32.0 26.0 - 34.0 pg   MCHC 34.2 30.0 - 36.0 g/dL   RDW 13.2 11.5 - 15.5 %   Platelets 184 150 - 400 K/uL   nRBC 0.0 0.0 - 0.2 %  Basic metabolic panel  Result Value Ref Range   Sodium 137 135 - 145 mmol/L   Potassium 3.1 (L) 3.5 - 5.1 mmol/L   Chloride 110 98 - 111 mmol/L   CO2 21 (L) 22 - 32 mmol/L   Glucose, Bld 103 (H) 70 - 99 mg/dL   BUN 25 (H) 8 - 23 mg/dL   Creatinine, Ser 1.01 0.61 - 1.24 mg/dL   Calcium 8.0 (L) 8.9 - 10.3 mg/dL   GFR, Estimated >60 >60 mL/min   Anion gap 6 5 - 15  POC occult blood, ED  Result Value Ref Range   Fecal Occult Bld NEGATIVE NEGATIVE   CT Head Wo Contrast  Addendum Date: 03/03/2022   ADDENDUM REPORT: 03/03/2022 18:35 ADDENDUM: The original report was by Dr. Van Clines. The following addendum is by Dr. Van Clines: Critical Value/emergent results were called by telephone at the time of interpretation on 03/03/2022 at 5:27 pm to provider Dr. Ronnald Nian, Who verbally acknowledged these results.  Electronically Signed   By: Van Clines M.D.   On: 03/03/2022 18:35   Result Date: 03/03/2022 CLINICAL DATA:  Head trauma, multiple falls. Patient struck head today. Ongoing dizziness. EXAM: CT HEAD WITHOUT CONTRAST TECHNIQUE: Contiguous axial images were obtained from the base of the skull through the vertex without intravenous contrast. RADIATION DOSE REDUCTION: This exam was performed according to the departmental dose-optimization program which includes automated exposure control, adjustment of the mA and/or kV according to patient size and/or use of iterative reconstruction technique. COMPARISON:  06/09/2019 FINDINGS: Brain: Notable bilateral fluid density extra-axial fluid collections, probably with a component of subdural hygroma although there are some crossing vessels which raise the possibility of benign expansion of the subarachnoid space. Small acute or subacute subdural hematoma anterolaterally in the left middle cranial fossa on images 10 through 12 of series 3, up to 0.4 cm in thickness. This appearance was not present on 06/09/2019. 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 Otherwise, the brainstem, cerebellum, cerebral peduncles, thalamus, basal ganglia,  basilar cisterns, and ventricular system appear within normal limits. No acute CVA identified. Vascular: There is atherosclerotic calcification of the cavernous carotid arteries bilaterally. Skull: Unremarkable Sinuses/Orbits: Unremarkable Other: Periapical lucency along a right maxillary premolar. IMPRESSION: 1. Small acute or subacute subdural hematoma anterolaterally in the left middle cranial fossa, up to 0.4 cm in thickness. 2. Notable bilateral fluid density extra-axial fluid collections, probably with a component of subdural hygroma although there are some crossing vessels which raise the possibility of benign expansion of the subarachnoid space. 3. Stable left pericallosal lipoma. 4. Periapical lucency along a right maxillary  premolar. 5. Atherosclerosis. Radiology assistant personnel have been notified to put me in telephone contact with the referring physician or the referring physician's clinical representative in order to discuss these findings. Once this communication is established I will issue an addendum to this report for documentation purposes. Electronically Signed: By: Van Clines M.D. On: 03/03/2022 17:21   CT ABDOMEN PELVIS W CONTRAST  Result Date: 03/03/2022 CLINICAL DATA:  Multiple falls, ongoing dizziness. Bruising along the body. EXAM: CT ABDOMEN AND PELVIS WITH CONTRAST TECHNIQUE: Multidetector CT imaging of the abdomen and pelvis was performed using the standard protocol following bolus administration of intravenous contrast. RADIATION DOSE REDUCTION: This exam was performed according to the departmental dose-optimization program which includes automated exposure control, adjustment of the mA and/or kV according to patient size and/or use of iterative reconstruction technique. CONTRAST:  121m OMNIPAQUE IOHEXOL 300 MG/ML  SOLN COMPARISON:  03/05/2018 FINDINGS: Lower chest: Descending thoracic aortic atherosclerosis. Small to moderate-sized hiatal hernia. Right anterior descending coronary artery atherosclerotic vascular disease. Old healed right posterolateral rib fractures. Tiny pulmonary nodules in the 3-4 mm range in the lung bases are stable and considered benign. These do not warrant further follow up. Hepatobiliary: Motion artifact obscures portions of the liver. Those portions not obscured or skipped by motion artifact appear unremarkable. The gallbladder appears grossly normal. Pancreas: Unremarkable Spleen: Motion artifacts cures the upper portion of the spleen. Otherwise unremarkable. Adrenals/Urinary Tract: Unremarkable Stomach/Bowel: Hiatal hernia is noted above. Vascular/Lymphatic: Atherosclerosis is present, including aortoiliac atherosclerotic disease. There is some atheromatous plaque  proximally in the superior mesenteric artery without occlusion. No pathologic adenopathy. Reproductive: Prostatomegaly, prostate gland 6.0 by 4.9 by 6.7 cm (volume = 100 cm^3) with some heterogeneous enhancement in the peripheral zone especially near the apex, nonspecific although corroboration with PSA level suggested. Other: No supplemental non-categorized findings. Musculoskeletal: Substantial bruising and partially visualized hematoma in the subcutaneous tissues lateral to the left hip and proximal femur as shown for example on images 75-92 of series 4. This is primarily centered in the subcutaneous tissues and not directly along the superficial fascia margin to necessarily indicate a Morel Lavallee lesion. Subcutaneous edema posterior to the distal gluteus maximus muscle without visible underlying fracture of the left hip. Degenerative facet arthropathy in the lower lumbar spine. Possible left lateral recess disc protrusion at L3-4 extending caudad. IMPRESSION: 1. Substantial bruising and partially visualized hematoma in the subcutaneous tissues lateral to the left hip and proximal femur. This is primarily centered in the subcutaneous tissues and not directly along the superficial fascia margin to necessarily indicate a Morel Lavallee lesion. 2. Subcutaneous edema posterior to the distal gluteus maximus muscle without visible underlying fracture of the left hip. 3. Possible left lateral recess disc protrusion at L3-4 extending caudad. 4. Prostatomegaly, prostate gland volume 100 cc. Mildly heterogeneous peripheral zone enhancement apically, correlate with PSA levels. 5. Small to moderate-sized hiatal hernia. 6. Old healed  right posterolateral rib fractures. 7. Aortic atherosclerosis. Aortic Atherosclerosis (ICD10-I70.0). Electronically Signed   By: Van Clines M.D.   On: 03/03/2022 17:20   CT Cervical Spine Wo Contrast  Result Date: 03/03/2022 CLINICAL DATA:  Trauma EXAM: CT CERVICAL SPINE WITHOUT  CONTRAST TECHNIQUE: Multidetector CT imaging of the cervical spine was performed without intravenous contrast. Multiplanar CT image reconstructions were also generated. RADIATION DOSE REDUCTION: This exam was performed according to the departmental dose-optimization program which includes automated exposure control, adjustment of the mA and/or kV according to patient size and/or use of iterative reconstruction technique. COMPARISON:  None Available. FINDINGS: Alignment: There is trace anterolisthesis at C7-T1 which is favored is degenerative. Alignment is otherwise anatomic. Skull base and vertebrae: The bones are osteopenic. No acute fracture. No primary bone lesion or focal pathologic process. Soft tissues and spinal canal: No prevertebral fluid or swelling. No visible canal hematoma. Disc levels: There is mild-to-moderate disc space narrowing throughout the cervical spine most significant at C3-C4, C5-C6 and C6-C7. There is no severe central canal or neural foraminal stenosis identified at any level. No significant central canal or neural foraminal stenosis at any level. Upper chest: Negative. Other: There is a 2.3 cm inferior left thyroid nodule. There are calcifications in the left thyroid gland. IMPRESSION: 1. No acute fracture or traumatic subluxation of the cervical spine. 2. Mild-to-moderate degenerative changes. 3. 2.3 cm incidental left thyroid nodule. Recommend non-emergent thyroid ultrasound if clinically warranted given patient age. Reference: J Am Coll Radiol. 2015 Feb;12(2): 143-50 Electronically Signed   By: Ronney Asters M.D.   On: 03/03/2022 16:55   DG Elbow Complete Left  Result Date: 03/03/2022 CLINICAL DATA:  Fall.  Pain. EXAM: LEFT ELBOW - COMPLETE 3+ VIEW COMPARISON:  None Available. FINDINGS: There is diffuse decreased bone mineralization. Unfortunately IV catheter tubing overlies the distal anterior humeral fat pad on lateral view. Within this limitation, no definite elevation of the  fat pad to indicate a joint effusion. No visualization of the posterior fat pad to indicate joint effusion. No acute fracture is seen. No dislocation. Joint spaces are preserved. IMPRESSION: No acute fracture is seen. Electronically Signed   By: Yvonne Kendall M.D.   On: 03/03/2022 15:12   DG Hip Unilat W or Wo Pelvis 2-3 Views Left  Result Date: 03/03/2022 CLINICAL DATA:  Multiple falls over the past few days.  Pain. EXAM: DG HIP (WITH OR WITHOUT PELVIS) 2-3V LEFT COMPARISON:  KUB 05/11/2019 FINDINGS: Mild to moderate pubic symphysis joint space narrowing, subchondral sclerosis, and peripheral osteophytosis. Mild bilateral superior femoroacetabular joint space narrowing. Unchanged small sclerotic focus within the left intertrochanteric region, likely a benign bone island. Similar likely small sclerotic bone island within the right femoral neck, unchanged from prior. No acute fracture or dislocation. IMPRESSION: 1. No acute fracture. 2. 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 Electronically Signed   By: Yvonne Kendall M.D.   On: 03/03/2022 15:11   DG Shoulder Left  Result Date: 03/03/2022 CLINICAL DATA:  Multiple falls over the past several days. EXAM: LEFT SHOULDER - 2+ VIEW COMPARISON:  None Available. FINDINGS: Mild inferior glenoid degenerative osteophytosis. Mild acromioclavicular joint space narrowing and peripheral osteophytosis. No acute fracture or dislocation. The visualized portion of the left lung is unremarkable. IMPRESSION: Mild acromioclavicular and glenohumeral osteoarthritis. No acute fracture. Electronically Signed   By: Yvonne Kendall M.D.   On: 03/03/2022 15:09   DG Ribs Unilateral W/Chest Left  Result Date: 03/03/2022 CLINICAL DATA:  Multiple falls in past  several days. EXAM: LEFT RIBS AND CHEST - 3+ VIEW COMPARISON:  Chest two views 07/25/2021 FINDINGS: Negative. Cardiac silhouette and mediastinal contours are within normal limits. Mild calcification  within aortic arch. Unchanged mild biapical pleural thickening/scarring. The lungs are clear. No pleural effusion pneumothorax. Mild-to-moderate multilevel disc space narrowing and endplate osteophytes of the thoracic spine. Unchanged elevation of the right clavicular head with respect to the right acromion. Ectopic ossification is again seen along the right coracohumeral ligament, likely remote ligamentous tear. The left acromioclavicular joint space is appropriately aligned. There is an oblique linear lucency with minimal 1-2 mm cortical step-off indicating a minimally displaced fracture of the lateral left fifth rib. IMPRESSION: 1. Minimally displaced acute fracture of the lateral left fifth rib. 2. No pneumothorax. 3. 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 Electronically Signed   By: Yvonne Kendall M.D.   On: 03/03/2022 15:08     ED Course / MDM    Medical Decision Making Amount and/or Complexity of Data Reviewed Labs: ordered. Radiology: ordered.  Risk OTC drugs.  Accepted handoff at shift change from [facility]. Please see prior provider note for full HPI.  Briefly: Patient is a 88 year old male who presents to the ER for multiple falls and dizziness for several days. Hx of dementia, lives at [facility]. Old bruising noted across the body. Not on blood thinners.   DDX/Plan: Awaiting CT imaging and laboratory evaluation. Suspect hemoglobin drop noted on CBC is likely due to significant hematomas, as imaged above. Plan to repeat H&H to evaluate hemoglobin levels.   Potassium 3.1, given potassium replacement. Urinalysis negative for infection. Hemoglobin 10.8, compared to 13.9 three months ago. Hemoccult negative. Repeat H&H with hemoglobin 11.0.  I personally reviewed and interpreted patient's CT head imaging that showed small acute to subacute subdural hematoma, without midline  shift.   I consulted with on call neurosurgeon Dr Annette Stable who viewed patient's imaging and did not believe he would require surgery. He recommended admission with repeat head imaging tomorrow to evaluate for worsening.   I consulted with hospitalist Dr Roel Cluck who will admit.   Legal guardian through [place], spoken with at the bedside and is aware of the disposition.   I discussed this case with my attending physician Dr. Ronnald Nian who cosigned this note including patient's presenting symptoms, physical exam, and planned diagnostics and interventions. Attending physician stated agreement with plan or made changes to plan which were implemented.    Kateri Plummer, PA-C 03/03/22 2016    Lennice Sites, DO 03/03/22 2035

## 2022-03-03 NOTE — Assessment & Plan Note (Signed)
Not on statins continue to monitor

## 2022-03-03 NOTE — ED Provider Notes (Cosign Needed Addendum)
[facility] DEPT Provider Note   CSN: [id number] Arrival date & time: 03/03/22  1138     History  Chief Complaint  Patient presents with   Jeffrey [name] is a 88 y.o. male with past medical history significant for hyperlipidemia, previous TIA, remote history of melanoma, dementia, chronic constipation who presents with concern for dizziness, multiple falls over the last several days.  Patient did report hitting his head with fall today.  He has multiple healing bruises over left temple, left shoulder, left elbow, left upper chest wall, left lower quadrant as well as some tenderness over the left hip.  He does not currently take any blood thinners.  He denies any nausea, vomiting.  Patient reports that he thinks he is dehydrated or malnourished.   Fall       Home Medications Prior to Admission medications   Medication Sig Start Date End Date Taking? Authorizing Provider  acetaminophen (TYLENOL) 325 MG tablet Take 650 mg by mouth every 6 (six) hours as needed for headache, fever or mild pain.   Yes [provider]  carbamide peroxide (DEBROX) 6.5 % OTIC solution Place 5 drops into both ears 2 (two) times daily. Patient taking differently: Place 5 drops into both ears See admin instructions. Administer 5 drops into each ear once daily for 5 days as needed for cerumen impaction 11/26/21  Yes Masters, Katie, DO  citalopram (CELEXA) 10 MG tablet Take 1 tablet (10 mg total) by mouth daily. Patient taking differently: Take 10 mg by mouth in the morning. 10/09/21  Yes Suzzanne Cloud, NP  docusate sodium (COLACE) 100 MG capsule Take 1 capsule (100 mg total) by mouth every 12 (twelve) hours. Patient taking differently: Take 100 mg by mouth 2 (two) times daily. 11/13/21  Yes Gareth Morgan, MD  donepezil (ARICEPT) 10 MG tablet Take 1 tablet (10 mg total) by mouth at bedtime. 10/09/21  Yes Suzzanne Cloud, NP  fluticasone (FLONASE) 50 MCG/ACT nasal  spray Place 1 spray into both nostrils as needed.   Yes [provider]  loratadine (CLARITIN) 10 MG tablet Take 1 tablet by mouth once daily Patient taking differently: Take 10 mg by mouth daily as needed for allergies. 01/21/22  Yes Masters, Katie, DO  memantine (NAMENDA) 10 MG tablet Take 1 tablet (10 mg total) by mouth 2 (two) times daily. 10/09/21  Yes Suzzanne Cloud, NP  Multiple Vitamin (MULTIVITAMIN) tablet Take 1 tablet by mouth daily. Senior multivitamin.   Yes [provider]  polyethylene glycol (MIRALAX / GLYCOLAX) 17 g packet Take 17 g by mouth daily. 11/26/21  Yes Masters, Scientist, research (physical sciences), DO      Allergies    Flomax [tamsulosin] and Flonase [fluticasone propionate]    Review of Systems   Review of Systems  Neurological:  Positive for dizziness and weakness.  All other systems reviewed and are negative.   Physical Exam Updated Vital Signs BP 134/64   Pulse 64   Temp 97.8 F (36.6 C) (Oral)   Resp 14   SpO2 100%  Physical Exam Vitals and nursing note reviewed.  Constitutional:      General: He is not in acute distress. HENT:     Head: Normocephalic and atraumatic.  Eyes:     General:        Right eye: No discharge.        Left eye: No discharge.  Cardiovascular:     Rate and Rhythm: Normal rate and  regular rhythm.     Heart sounds: No murmur heard.    No friction rub. No gallop.  Pulmonary:     Effort: Pulmonary effort is normal.     Breath sounds: Normal breath sounds.  Abdominal:     General: Bowel sounds are normal.     Palpations: Abdomen is soft.  Musculoskeletal:     Comments: Patient with tenderness palpation over the left hip, and left shoulder, with normal range of motion, no leg length discrepancy, patient can move all 4 limbs spontaneously  Skin:    General: Skin is warm and dry.     Capillary Refill: Capillary refill takes less than 2 seconds.     Comments: Considerable bruising with yellowing of her left temple, left clavicle and chest,  left hip, left lower quadrant, no significant evidence of acute injury, hematoma, new bruising  Neurological:     Mental Status: He is alert.     Comments: Patient is alert and oriented to self but not to time or place, he can move all 4 limbs spontaneously, intact strength 5/5 bilateral upper and lower extremities, seemingly intact coordination but difficult to perform more detailed neuro exam due to poor directability  Psychiatric:        Mood and Affect: Mood normal.        Behavior: Behavior normal.        ED Results / Procedures / Treatments   Labs (all labs ordered are listed, but only abnormal results are displayed) Labs Reviewed  CBC - Abnormal; Notable for the following components:      Result Value   RBC 3.38 (*)    Hemoglobin 10.8 (*)    HCT 31.6 (*)    All other components within normal limits  URINALYSIS, ROUTINE W REFLEX MICROSCOPIC  BASIC METABOLIC PANEL  POC OCCULT BLOOD, ED    EKG None  Radiology DG Elbow Complete Left  Result Date: 03/03/2022 CLINICAL DATA:  Fall.  Pain. EXAM: LEFT ELBOW - COMPLETE 3+ VIEW COMPARISON:  None Available. FINDINGS: There is diffuse decreased bone mineralization. Unfortunately IV catheter tubing overlies the distal anterior humeral fat pad on lateral view. Within this limitation, no definite elevation of the fat pad to indicate a joint effusion. No visualization of the posterior fat pad to indicate joint effusion. No acute fracture is seen. No dislocation. Joint spaces are preserved. IMPRESSION: No acute fracture is seen. Electronically Signed   By: Yvonne Kendall M.D.   On: 03/03/2022 15:12   DG Hip Unilat W or Wo Pelvis 2-3 Views Left  Result Date: 03/03/2022 CLINICAL DATA:  Multiple falls over the past few days.  Pain. EXAM: DG HIP (WITH OR WITHOUT PELVIS) 2-3V LEFT COMPARISON:  KUB 05/11/2019 FINDINGS: Mild to moderate pubic symphysis joint space narrowing, subchondral sclerosis, and peripheral osteophytosis. Mild bilateral  superior femoroacetabular joint space narrowing. Unchanged small sclerotic focus within the left intertrochanteric region, likely a benign bone island. Similar likely small sclerotic bone island within the right femoral neck, unchanged from prior. No acute fracture or dislocation. IMPRESSION: 1. No acute fracture. 2. 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 Electronically Signed   By: Yvonne Kendall M.D.   On: 03/03/2022 15:11   DG Shoulder Left  Result Date: 03/03/2022 CLINICAL DATA:  Multiple falls over the past several days. EXAM: LEFT SHOULDER - 2+ VIEW COMPARISON:  None Available. FINDINGS: Mild inferior glenoid degenerative osteophytosis. Mild acromioclavicular joint space narrowing and peripheral osteophytosis. No acute fracture or dislocation. The visualized  portion of the left lung is unremarkable. IMPRESSION: Mild acromioclavicular and glenohumeral osteoarthritis. No acute fracture. Electronically Signed   By: Yvonne Kendall M.D.   On: 03/03/2022 15:09   DG Ribs Unilateral W/Chest Left  Result Date: 03/03/2022 CLINICAL DATA:  Multiple falls in past several days. EXAM: LEFT RIBS AND CHEST - 3+ VIEW COMPARISON:  Chest two views 07/25/2021 FINDINGS: Negative. Cardiac silhouette and mediastinal contours are within normal limits. Mild calcification within aortic arch. Unchanged mild biapical pleural thickening/scarring. The lungs are clear. No pleural effusion pneumothorax. Mild-to-moderate multilevel disc space narrowing and endplate osteophytes of the thoracic spine. Unchanged elevation of the right clavicular head with respect to the right acromion. Ectopic ossification is again seen along the right coracohumeral ligament, likely remote ligamentous tear. The left acromioclavicular joint space is appropriately aligned. There is an oblique linear lucency with minimal 1-2 mm cortical step-off indicating a minimally displaced fracture of the lateral left fifth rib. IMPRESSION:  1. Minimally displaced acute fracture of the lateral left fifth rib. 2. No pneumothorax. 3. 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 Electronically Signed   By: Yvonne Kendall M.D.   On: 03/03/2022 15:08    Procedures Procedures    Medications Ordered in ED Medications  acetaminophen (TYLENOL) tablet 650 mg (650 mg Oral Given 03/03/22 1323)    ED Course/ Medical Decision Making/ A&P                           Medical Decision Making Amount and/or Complexity of Data Reviewed Labs: ordered. Radiology: ordered.  Risk OTC drugs.   This is an overall well-appearing 88 year old male who With concern for frequent falls, dizziness, my emergent differential diagnosis includes acute fracture, dislocation, intracranial head bleed, or other injury from the frequent falls, considered stroke, epidural or subdural hematoma, intracranial hemorrhage, peripheral or central vertigo versus other cause for fall.  Previous history of hyperlipidemia, TIA, remote history of melanoma in 2005.  He does not take any blood thinners.  He has multiple areas of bruising as described in physical exam above, concern for intrathoracic, intra-abdominal, left upper extremity, left hip injuries based on his bruising pattern.  He is overall neurologically intact at his baseline, no obvious neurologic deficits, did not ambulate patient secondary to his current condition, but will need ambulation prior to safe discharge if continue to have dizziness.   Independently interpreted lab work including CBC which is notable for mild to moderate anemia, hemoglobin 10.8 today compared to 13.9 3 months ago, We will obtain hemoccult based on drop in hemoglobin, independently interpreted imaging including plain film radiograph of the left elbow, left hip, left shoulder, and left chest which showed evidence of left fifth rib fracture  without significant displacement, no other acute fractures or dislocations noted.  I agree with the radiologist interpretation.  I independently interpreted an EKG which shows evidence of normal sinus rhythm, minimally prolonged QT.  My attending Dr. Billy Fischer independently interpreted and agrees with this EKG interpretation.  3:29 PM Care of KIYAN BURMESTER transferred to [place] and Dr. Vanita Panda at the end of my shift as the patient will require reassessment once labs/imaging have resulted. Patient presentation, ED course, and plan of care discussed with review of all pertinent labs and imaging. Please see his/her note for further details regarding further ED course and disposition. Plan at time of handoff  is await further imaging, lab work, potential MRI to assess for dizziness, although some suspicion that may be secondary to his anemia, advanced age, and poor monitoring. This may be altered or completely changed at the discretion of the oncoming team pending results of further workup.  Final Clinical Impression(s) / ED Diagnoses Final diagnoses:  None    Rx / DC Orders ED Discharge Orders     None         Anselmo Pickler, PA-C 03/03/22 1519    Anselmo Pickler, PA-C 03/03/22 1529    Gareth Morgan, MD 03/04/22 1041

## 2022-03-03 NOTE — H&P (Signed)
Jeffrey [name] IRW:[id number] DOB: 03/19/1929 DOA: 03/03/2022     PCP: Christiana Fuchs, DO   Outpatient Specialists:      Patient arrived to ER on 03/03/22 at 1138 Referred by Attending Lennice Sites, DO   Patient coming from:     From facility [facility]  Chief Complaint:   Chief Complaint  Patient presents with   Fall    HPI: Jeffrey [name] is a 88 y.o. male with medical history significant of dementia, BPH, HLD, hx of TIA  Presented with   fall Patient transferred from [facility] with multiple falls over past few days not on blood thinners but has hit his head today has been having ongoing dizziness that causes his falls.  He has he has known history of dementia With multiple bruises all over his body no nausea no vomiting but he does think that he is dehydrated.  Very large hematoma of left hip     Regarding pertinent Chronic problems:     Hyperlipidemia - not on statins   Lipid Panel     Component Value Date/Time   CHOL 135 01/31/2020 0847   TRIG 77 05/11/2020 2044   HDL 58.40 01/31/2020 0847   CHOLHDL 2 01/31/2020 0847   VLDL 32.0 01/31/2020 0847   LDLCALC 44 01/31/2020 0847   LDLDIRECT 61.0 01/20/2018 1051   HTN  not on meds      Hx of TIA-  with/out residual deficits      Dementia - on Aricept     Chronic anemia - baseline hg Hemoglobin & Hematocrit  Recent Labs    11/13/21 0835 03/03/22 1331 03/03/22 1903  HGB 13.9 10.8* 11.0*     While in ER:  CT head showed subacute subdural hematoma neurosurgery has been consulted plan to have the patient admitted repeat CT in a.m. patient also noted to have significant anemia likely secondary to thigh hematoma.   No fracture was noted   Ordered  CT HEAD Small acute or subacute subdural hematoma anterolaterally in the left middle cranial fossa, up to 0.4 cm in thickness. 2. Notable bilateral fluid density extra-axial fluid collections, probably with a component of subdural hygroma  although there are some crossing vessels which raise the possibility of benign expansion of the subarachnoid space.   CXR - Minimally displaced acute fracture of the lateral left fifth rib.  CTabd/pelvis - Substantial bruising and partially visualized hematoma in the subcutaneous tissues lateral to the left hip and proximal femur. This is primarily centered in the subcutaneous tissues and not directly along the superficial fascia margin to necessarily indicate a Morel Lavallee lesion.    Following Medications were ordered in ER: Medications  influenza vaccine adjuvanted (FLUAD) injection 0.5 mL (has no administration in time range)  acetaminophen (TYLENOL) tablet 650 mg (650 mg Oral Given 03/03/22 1323)  iohexol (OMNIPAQUE) 300 MG/ML solution 100 mL (100 mLs Intravenous Contrast Given 03/03/22 1628)  sodium chloride (PF) 0.9 % injection (  Given by Other 03/03/22 1709)  potassium chloride SA (KLOR-CON M) CR tablet 40 mEq (40 mEq Oral Given 03/03/22 1656)    _______________________________________________________ ER Provider Called:    Neurosurgery Dr.Pool They Recommend admit to medicine   CT in AM   ED Triage Vitals  Enc Vitals Group     BP 03/03/22 1200 (!) 146/62     Pulse Rate 03/03/22 1200 72     Resp 03/03/22 1200 16     Temp 03/03/22 1214 97.8 F (36.6  C)     Temp Source 03/03/22 1214 Oral     SpO2 03/03/22 1152 99 %     Weight --      Height --      Head Circumference --      Peak Flow --      Pain Score 03/03/22 1157 4     Pain Loc --      Pain Edu? --      Excl. in [place]? --   TMAX(24)@     _________________________________________ Significant initial  Findings: Abnormal Labs Reviewed  CBC - Abnormal; Notable for the following components:      Result Value   RBC 3.38 (*)    Hemoglobin 10.8 (*)    HCT 31.6 (*)    All other components within normal limits  URINALYSIS, ROUTINE W REFLEX MICROSCOPIC - Abnormal; Notable for the following components:   Specific  Gravity, Urine >1.046 (*)    Ketones, ur 20 (*)    All other components within normal limits  BASIC METABOLIC PANEL - Abnormal; Notable for the following components:   Potassium 3.1 (*)    CO2 21 (*)    Glucose, Bld 103 (*)    BUN 25 (*)    Calcium 8.0 (*)    All other components within normal limits  HEMOGLOBIN AND HEMATOCRIT, BLOOD - Abnormal; Notable for the following components:   Hemoglobin 11.0 (*)    HCT 32.6 (*)    All other components within normal limits    ____________________  ECG: Ordered Personally reviewed and interpreted by me showing: HR : 71 Rhythm:Sinus rhythm Abnormal R-wave progression, early transition Minimal ST depression, anterolateral leads QTC 492   The recent clinical data is shown below. Vitals:   03/03/22 1645 03/03/22 1658 03/03/22 1803 03/03/22 1915  BP: (!) 130/56  134/63 117/63  Pulse: 62  (!) 59 68  Resp:   14 14  Temp:  98.1 F (36.7 C)    TempSrc:  Oral    SpO2: 100%  100% 100%      WBC     Component Value Date/Time   WBC 8.5 03/03/2022 1331   LYMPHSABS 1.4 08/21/2020 1754   MONOABS 0.7 08/21/2020 1754   EOSABS 0.1 08/21/2020 1754   BASOSABS 0.1 08/21/2020 1754      UA   no evidence of UTI      Urine analysis:    Component Value Date/Time   COLORURINE YELLOW 03/03/2022 1813   APPEARANCEUR CLEAR 03/03/2022 1813   LABSPEC >1.046 (H) 03/03/2022 1813   PHURINE 6.0 03/03/2022 1813   GLUCOSEU NEGATIVE 03/03/2022 1813   HGBUR NEGATIVE 03/03/2022 1813   BILIRUBINUR NEGATIVE 03/03/2022 1813   BILIRUBINUR Neg 05/11/2019 1720   KETONESUR 20 (A) 03/03/2022 1813   PROTEINUR NEGATIVE 03/03/2022 1813   UROBILINOGEN 0.2 05/11/2019 1720   NITRITE NEGATIVE 03/03/2022 1813   LEUKOCYTESUR NEGATIVE 03/03/2022 1813    _______________________________________________ Hospitalist was called for admission for  SDH (subdural hematoma)   Hematoma of left thigh,   Closed fracture of one rib of left side, initial encounter    The following  Work up has been ordered so far:  Orders Placed This Encounter  Procedures   CT Head Wo Contrast   CT Cervical Spine Wo Contrast   DG Shoulder Left   DG Elbow Complete Left   DG Hip Unilat W or Wo Pelvis 2-3 Views Left   CT ABDOMEN PELVIS W CONTRAST   DG Ribs Unilateral W/Chest Left  CBC   Urinalysis, Routine w reflex microscopic Urine, Clean Catch   Basic metabolic panel   Hemoglobin and hematocrit, blood   Consult to neurosurgery   Consult to hospitalist   POC occult blood, ED   ED EKG   EKG 12-Lead     OTHER Significant initial  Findings:  labs showing:  Recent Labs  Lab 03/03/22 1546  NA 137  K 3.1*  CO2 21*  GLUCOSE 103*  BUN 25*  CREATININE 1.01  CALCIUM 8.0*    Cr  stable,   Lab Results  Component Value Date   CREATININE 1.01 03/03/2022   CREATININE 1.09 11/13/2021   CREATININE 0.90 08/21/2020    No results for input(s): "AST", "ALT", "ALKPHOS", "BILITOT", "PROT", "ALBUMIN" in the last 168 hours. Lab Results  Component Value Date   CALCIUM 8.0 (L) 03/03/2022   PHOS 2.6 05/15/2020    Plt: Lab Results  Component Value Date   PLT 184 03/03/2022    COVID-19 Labs  No results for input(s): "DDIMER", "FERRITIN", "LDH", "CRP" in the last 72 hours.  Lab Results  Component Value Date   SARSCOV2NAA NEGATIVE 07/25/2021   SARSCOV2NAA NEGATIVE 08/22/2020   SARSCOV2NAA NEGATIVE 08/06/2020   SARSCOV2NAA POSITIVE (A) 05/04/2020      Recent Labs  Lab 03/03/22 1331 03/03/22 1903  WBC 8.5  --   HGB 10.8* 11.0*  HCT 31.6* 32.6*  MCV 93.5  --   PLT 184  --     HG/HCT  stable,     Component Value Date/Time   HGB 11.0 (L) 03/03/2022 1903   HCT 32.6 (L) 03/03/2022 1903   MCV 93.5 03/03/2022 1331          Cultures:    Component Value Date/Time   SDES  05/12/2020 0500    BLOOD RIGHT ANTECUBITAL Performed at [facility] [place] [address], [place], [place] [address]    [place]  05/12/2020 0500    BOTTLES DRAWN AEROBIC AND  ANAEROBIC Blood Culture adequate volume Performed at [place] [address] [place], [place] [address]    CULT  05/12/2020 0500    NO GROWTH 5 DAYS Performed at [place] [address] [place], [place] [address]    REPTSTATUS 05/17/2020 FINAL 05/12/2020 0500     Radiological Exams on Admission: CT Head Wo Contrast  Addendum Date: 03/03/2022   ADDENDUM REPORT: 03/03/2022 18:35 ADDENDUM: The original report was by Dr. Van Clines. The following addendum is by Dr. Van Clines: Critical Value/emergent results were called by telephone at the time of interpretation on 03/03/2022 at 5:27 pm to provider Dr. Ronnald Nian, Who verbally acknowledged these results. Electronically Signed   By: Van Clines M.D.   On: 03/03/2022 18:35   Result Date: 03/03/2022 CLINICAL DATA:  Head trauma, multiple falls. Patient struck head today. Ongoing dizziness. EXAM: CT HEAD WITHOUT CONTRAST TECHNIQUE: Contiguous axial images were obtained from the base of the skull through the vertex without intravenous contrast. RADIATION DOSE REDUCTION: This exam was performed according to the departmental dose-optimization program which includes automated exposure control, adjustment of the mA and/or kV according to patient size and/or use of iterative reconstruction technique. COMPARISON:  06/09/2019 FINDINGS: Brain: Notable bilateral fluid density extra-axial fluid collections, probably with a component of subdural hygroma although there are some crossing vessels which raise the possibility of benign expansion of the subarachnoid space. Small acute or subacute subdural hematoma anterolaterally in the left middle cranial fossa on images 10 through  12 of series 3, up to 0.4 cm in thickness. This appearance was not present on 06/09/2019. 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 Otherwise, the brainstem, cerebellum, cerebral peduncles, thalamus, basal ganglia, basilar cisterns, and  ventricular system appear within normal limits. No acute CVA identified. Vascular: There is atherosclerotic calcification of the cavernous carotid arteries bilaterally. Skull: Unremarkable Sinuses/Orbits: Unremarkable Other: Periapical lucency along a right maxillary premolar. IMPRESSION: 1. Small acute or subacute subdural hematoma anterolaterally in the left middle cranial fossa, up to 0.4 cm in thickness. 2. Notable bilateral fluid density extra-axial fluid collections, probably with a component of subdural hygroma although there are some crossing vessels which raise the possibility of benign expansion of the subarachnoid space. 3. Stable left pericallosal lipoma. 4. Periapical lucency along a right maxillary premolar. 5. Atherosclerosis. Radiology assistant personnel have been notified to put me in telephone contact with the referring physician or the referring physician's clinical representative in order to discuss these findings. Once this communication is established I will issue an addendum to this report for documentation purposes. Electronically Signed: By: Van Clines M.D. On: 03/03/2022 17:21   CT ABDOMEN PELVIS W CONTRAST  Result Date: 03/03/2022 CLINICAL DATA:  Multiple falls, ongoing dizziness. Bruising along the body. EXAM: CT ABDOMEN AND PELVIS WITH CONTRAST TECHNIQUE: Multidetector CT imaging of the abdomen and pelvis was performed using the standard protocol following bolus administration of intravenous contrast. RADIATION DOSE REDUCTION: This exam was performed according to the departmental dose-optimization program which includes automated exposure control, adjustment of the mA and/or kV according to patient size and/or use of iterative reconstruction technique. CONTRAST:  177m OMNIPAQUE IOHEXOL 300 MG/ML  SOLN COMPARISON:  03/05/2018 FINDINGS: Lower chest: Descending thoracic aortic atherosclerosis. Small to moderate-sized hiatal hernia. Right anterior descending coronary artery  atherosclerotic vascular disease. Old healed right posterolateral rib fractures. Tiny pulmonary nodules in the 3-4 mm range in the lung bases are stable and considered benign. These do not warrant further follow up. Hepatobiliary: Motion artifact obscures portions of the liver. Those portions not obscured or skipped by motion artifact appear unremarkable. The gallbladder appears grossly normal. Pancreas: Unremarkable Spleen: Motion artifacts cures the upper portion of the spleen. Otherwise unremarkable. Adrenals/Urinary Tract: Unremarkable Stomach/Bowel: Hiatal hernia is noted above. Vascular/Lymphatic: Atherosclerosis is present, including aortoiliac atherosclerotic disease. There is some atheromatous plaque proximally in the superior mesenteric artery without occlusion. No pathologic adenopathy. Reproductive: Prostatomegaly, prostate gland 6.0 by 4.9 by 6.7 cm (volume = 100 cm^3) with some heterogeneous enhancement in the peripheral zone especially near the apex, nonspecific although corroboration with PSA level suggested. Other: No supplemental non-categorized findings. Musculoskeletal: Substantial bruising and partially visualized hematoma in the subcutaneous tissues lateral to the left hip and proximal femur as shown for example on images 75-92 of series 4. This is primarily centered in the subcutaneous tissues and not directly along the superficial fascia margin to necessarily indicate a Morel Lavallee lesion. Subcutaneous edema posterior to the distal gluteus maximus muscle without visible underlying fracture of the left hip. Degenerative facet arthropathy in the lower lumbar spine. Possible left lateral recess disc protrusion at L3-4 extending caudad. IMPRESSION: 1. Substantial bruising and partially visualized hematoma in the subcutaneous tissues lateral to the left hip and proximal femur. This is primarily centered in the subcutaneous tissues and not directly along the superficial fascia margin to  necessarily indicate a Morel Lavallee lesion. 2. Subcutaneous edema posterior to the distal gluteus maximus muscle without visible underlying fracture of the left hip. 3. Possible left  lateral recess disc protrusion at L3-4 extending caudad. 4. Prostatomegaly, prostate gland volume 100 cc. Mildly heterogeneous peripheral zone enhancement apically, correlate with PSA levels. 5. Small to moderate-sized hiatal hernia. 6. Old healed right posterolateral rib fractures. 7. Aortic atherosclerosis. Aortic Atherosclerosis (ICD10-I70.0). Electronically Signed   By: Van Clines M.D.   On: 03/03/2022 17:20   CT Cervical Spine Wo Contrast  Result Date: 03/03/2022 CLINICAL DATA:  Trauma EXAM: CT CERVICAL SPINE WITHOUT CONTRAST TECHNIQUE: Multidetector CT imaging of the cervical spine was performed without intravenous contrast. Multiplanar CT image reconstructions were also generated. RADIATION DOSE REDUCTION: This exam was performed according to the departmental dose-optimization program which includes automated exposure control, adjustment of the mA and/or kV according to patient size and/or use of iterative reconstruction technique. COMPARISON:  None Available. FINDINGS: Alignment: There is trace anterolisthesis at C7-T1 which is favored is degenerative. Alignment is otherwise anatomic. Skull base and vertebrae: The bones are osteopenic. No acute fracture. No primary bone lesion or focal pathologic process. Soft tissues and spinal canal: No prevertebral fluid or swelling. No visible canal hematoma. Disc levels: There is mild-to-moderate disc space narrowing throughout the cervical spine most significant at C3-C4, C5-C6 and C6-C7. There is no severe central canal or neural foraminal stenosis identified at any level. No significant central canal or neural foraminal stenosis at any level. Upper chest: Negative. Other: There is a 2.3 cm inferior left thyroid nodule. There are calcifications in the left thyroid gland.  IMPRESSION: 1. No acute fracture or traumatic subluxation of the cervical spine. 2. Mild-to-moderate degenerative changes. 3. 2.3 cm incidental left thyroid nodule. Recommend non-emergent thyroid ultrasound if clinically warranted given patient age. Reference: J Am Coll Radiol. 2015 Feb;12(2): 143-50 Electronically Signed   By: Ronney Asters M.D.   On: 03/03/2022 16:55   DG Elbow Complete Left  Result Date: 03/03/2022 CLINICAL DATA:  Fall.  Pain. EXAM: LEFT ELBOW - COMPLETE 3+ VIEW COMPARISON:  None Available. FINDINGS: There is diffuse decreased bone mineralization. Unfortunately IV catheter tubing overlies the distal anterior humeral fat pad on lateral view. Within this limitation, no definite elevation of the fat pad to indicate a joint effusion. No visualization of the posterior fat pad to indicate joint effusion. No acute fracture is seen. No dislocation. Joint spaces are preserved. IMPRESSION: No acute fracture is seen. Electronically Signed   By: Yvonne Kendall M.D.   On: 03/03/2022 15:12   DG Hip Unilat W or Wo Pelvis 2-3 Views Left  Result Date: 03/03/2022 CLINICAL DATA:  Multiple falls over the past few days.  Pain. EXAM: DG HIP (WITH OR WITHOUT PELVIS) 2-3V LEFT COMPARISON:  KUB 05/11/2019 FINDINGS: Mild to moderate pubic symphysis joint space narrowing, subchondral sclerosis, and peripheral osteophytosis. Mild bilateral superior femoroacetabular joint space narrowing. Unchanged small sclerotic focus within the left intertrochanteric region, likely a benign bone island. Similar likely small sclerotic bone island within the right femoral neck, unchanged from prior. No acute fracture or dislocation. IMPRESSION: 1. No acute fracture. 2. 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 Electronically Signed   By: Yvonne Kendall M.D.   On: 03/03/2022 15:11   DG Shoulder Left  Result Date: 03/03/2022 CLINICAL DATA:  Multiple falls over the past several days. EXAM: LEFT SHOULDER -  2+ VIEW COMPARISON:  None Available. FINDINGS: Mild inferior glenoid degenerative osteophytosis. Mild acromioclavicular joint space narrowing and peripheral osteophytosis. No acute fracture or dislocation. The visualized portion of the left lung is unremarkable. IMPRESSION: Mild acromioclavicular and glenohumeral osteoarthritis. No  acute fracture. Electronically Signed   By: Yvonne Kendall M.D.   On: 03/03/2022 15:09   DG Ribs Unilateral W/Chest Left  Result Date: 03/03/2022 CLINICAL DATA:  Multiple falls in past several days. EXAM: LEFT RIBS AND CHEST - 3+ VIEW COMPARISON:  Chest two views 07/25/2021 FINDINGS: Negative. Cardiac silhouette and mediastinal contours are within normal limits. Mild calcification within aortic arch. Unchanged mild biapical pleural thickening/scarring. The lungs are clear. No pleural effusion pneumothorax. Mild-to-moderate multilevel disc space narrowing and endplate osteophytes of the thoracic spine. Unchanged elevation of the right clavicular head with respect to the right acromion. Ectopic ossification is again seen along the right coracohumeral ligament, likely remote ligamentous tear. The left acromioclavicular joint space is appropriately aligned. There is an oblique linear lucency with minimal 1-2 mm cortical step-off indicating a minimally displaced fracture of the lateral left fifth rib. IMPRESSION: 1. Minimally displaced acute fracture of the lateral left fifth rib. 2. No pneumothorax. 3. 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 Electronically Signed   By: Yvonne Kendall M.D.   On: 03/03/2022 15:08   _______________________________________________________________________________________________________ Latest  Blood pressure 117/63, pulse 68, temperature 98.1 F (36.7 C), temperature source Oral, resp. rate 14, SpO2 100 %.   Vitals  labs and radiology finding personally  reviewed  Review of Systems:    Pertinent positives include:  fatigue,   Constitutional:  No weight loss, night sweats, Fevers, chills, weight loss  HEENT:  No headaches, Difficulty swallowing,Tooth/dental problems,Sore throat,  No sneezing, itching, ear ache, nasal congestion, post nasal drip,  Cardio-vascular:  No chest pain, Orthopnea, PND, anasarca, dizziness, palpitations.no Bilateral lower extremity swelling  GI:  No heartburn, indigestion, abdominal pain, nausea, vomiting, diarrhea, change in bowel habits, loss of appetite, melena, blood in stool, hematemesis Resp:  no shortness of breath at rest. No dyspnea on exertion, No excess mucus, no productive cough, No non-productive cough, No coughing up of blood.No change in color of mucus.No wheezing. Skin:  no rash or lesions. No jaundice GU:  no dysuria, change in color of urine, no urgency or frequency. No straining to urinate.  No flank pain.  Musculoskeletal:  No joint pain or no joint swelling. No decreased range of motion. No back pain.  Psych:  No change in mood or affect. No depression or anxiety. No memory loss.  Neuro: no localizing neurological complaints, no tingling, no weakness, no double vision, no gait abnormality, no slurred speech, no confusion  All systems reviewed and apart from [employer] all are negative _______________________________________________________________________________________________ Past Medical History:   Past Medical History:  Diagnosis Date   Adenomatous polyp of colon 1995    w/ HGD   Advance care planning 12/29/2013   Advance directive- Sister [place] designated patient were incapacitated.   Allergic rhinitis    Bilateral cold feet 03/07/2020   BPH (benign prostatic hyperplasia)    COLONIC POLYPS, HX OF 08/24/2006   Qualifier: Diagnosis of  By: Council Mechanic MD, Hilaria Ota    Diverticulosis 09/21/2000   DIVERTICULOSIS, COLON 09/21/2000   Qualifier: Diagnosis of  By: Council Mechanic MD,  Hilaria Ota    Dizziness 03/07/2020   ED (erectile dysfunction)    ED (erectile dysfunction) 11/14/2010   EXTERNAL HEMORRHOIDS 05/02/2010   Qualifier: Diagnosis of  By: Damita Dunnings MD, Phillip Heal     Hyperlipemia 05/05/1988   HYPERTROPHY PROSTATE W/UR OBST & OTH LUTS 10/09/2009   Pt uninterested in further testing.  Hypokalemia 05/11/2020   Hyponatremia 05/11/2020   Infected sebaceous cyst 01/25/2020   Knee pain 08/13/2011   Lung nodule 02/03/2019   Medicare annual wellness visit, subsequent 12/22/2011   Memory change 01/03/2016   Memory loss    Nasal sinus congestion 03/08/2019   Paresthesia of foot 08/13/2011   Rectal bleeding 09/26/2019   Schamberg's purpura    2012- eval by Dr. Allyson Sabal   Sebaceous cyst    Shoulder pain, right 08/24/2016   Skin cancer 05/05/1998   hx of Scalp-melanoma   TIA (transient ischemic attack)      Past Surgical History:  Procedure Laterality Date   Carotid U/S Nml  09/08/2007   Colon polyp  2011   B9 Int Hemms   Colonoscopy polyps  10/05/2003   Divertics in hemms   COLONOSCOPY W/ POLYPECTOMY  08/1993; 09/21/2000;10/05/2003   B9   Echo (other)  09/08/2007   nml lvf EF 50-55% Mild Diast Dysfctn Triv Ar   melanoma scalp  07/2003   SEPTOPLASTY  1995   Skin revision vertex of scalp  2009   Dr. Luetta Nutting, [place]      Social History:  Ambulatory      reports that he quit smoking about 63 years ago. His smoking use included cigarettes. He has never used smokeless tobacco. He reports that he does not drink alcohol and does not use drugs.   Family History:  Family History  Problem Relation Age of Onset   Rectal cancer Father    Colon cancer Paternal Aunt    Other Paternal Aunt        cancer on back of neck   Prostate cancer Neg Hx    ______________________________________________________________________________________________ Allergies: Allergies  Allergen Reactions   Flomax [Tamsulosin] Other (See Comments)    Flushing  Arm stiffness   Flonase [Fluticasone  Propionate] Other (See Comments)    Epistaxis     Prior to Admission medications   Medication Sig Start Date End Date Taking? Authorizing Provider  acetaminophen (TYLENOL) 325 MG tablet Take 650 mg by mouth every 6 (six) hours as needed for headache, fever or mild pain.   Yes [provider]  carbamide peroxide (DEBROX) 6.5 % OTIC solution Place 5 drops into both ears 2 (two) times daily. Patient taking differently: Place 5 drops into both ears See admin instructions. Administer 5 drops into each ear once daily for 5 days as needed for cerumen impaction 11/26/21  Yes Masters, Katie, DO  citalopram (CELEXA) 10 MG tablet Take 1 tablet (10 mg total) by mouth daily. Patient taking differently: Take 10 mg by mouth in the morning. 10/09/21  Yes Suzzanne Cloud, NP  docusate sodium (COLACE) 100 MG capsule Take 1 capsule (100 mg total) by mouth every 12 (twelve) hours. Patient taking differently: Take 100 mg by mouth 2 (two) times daily. 11/13/21  Yes Gareth Morgan, MD  donepezil (ARICEPT) 10 MG tablet Take 1 tablet (10 mg total) by mouth at bedtime. 10/09/21  Yes Suzzanne Cloud, NP  fluticasone (FLONASE) 50 MCG/ACT nasal spray Place 1 spray into both nostrils as needed.   Yes [provider]  loratadine (CLARITIN) 10 MG tablet Take 1 tablet by mouth once daily Patient taking differently: Take 10 mg by mouth daily as needed for allergies. 01/21/22  Yes Masters, Katie, DO  memantine (NAMENDA) 10 MG tablet Take 1 tablet (10 mg total) by mouth 2 (two) times daily. 10/09/21  Yes Suzzanne Cloud, NP  Multiple Vitamin (MULTIVITAMIN)  tablet Take 1 tablet by mouth daily. Senior multivitamin.   Yes [provider]  polyethylene glycol (MIRALAX / GLYCOLAX) 17 g packet Take 17 g by mouth daily. 11/26/21  Yes Masters, [place], DO    ___________________________________________________________________________________________________ Physical Exam:    03/03/2022    7:15 PM 03/03/2022    6:03 PM  03/03/2022    4:45 PM  Vitals with BMI  Systolic [phone]  Diastolic 63 63 56  Pulse 68 59 62    1. General:  in No  Acute distress   Chronically ill -appearing 2. Psychological: Alert and  Oriented 3. Head/ENT:    Dry Mucous Membranes                          Head  traumatic, neck supple                         Poor Dentition 4. SKIN:  decreased Skin turgor,  Skin clean Dry and intact no rash 5. Heart: Regular rate and rhythm no  Murmur, no Rub or gallop 6. Lungs: , no wheezes or crackles   7. Abdomen: Soft,  non-tender, Non distended   bowel sounds present 8. Lower extremities: no clubbing, cyanosis, no  edema 9. Neurologically Grossly intact, moving all 4 extremities equally    10. MSK: Normal range of motion    Chart has been reviewed  ______________________________________________________________________________________________  Assessment/Plan 88 y.o. male with medical history significant of dementia, BPH, HLD, hx of TIA  Admitted for   SDH, Hematoma of left thigh, Closed fracture of one rib of left side, initial encounter    Present on Admission:  Subdural hematoma (HCC)  HLD (hyperlipidemia)  Dementia (HCC)  Rib fracture  Anemia  Hypokalemia  Elevated lactic acid level     Subdural hematoma [facility]) Neurosurgery has been consulted plan for repeat CT head in a.m. no indication for surgical intervention at this time neurochecks every 4  HLD (hyperlipidemia) Not on statins continue to monitor  Dementia (HCC) Continue Aricept at 10 mg with nightly  Rib fracture Continue pain management incentive spirometer  Anemia Obtain anemia panel most likely anemia secondary to hematoma of the thigh.  Continue to follow CBC.  Transfuse as needed for hemoglobin below 7 or rapid drop  Hypokalemia Repeat potassium check magnesium and phosphate level  Elevated lactic acid level Will rehydrate and follow, follow serial lactic acid     Other plan as per orders.  DVT  prophylaxis:  SCD      Code Status:    Code Status: Prior FULL CODE unable to get a hold of legal guardian at this time will need to really address CODE STATUS in a.m.  Family Communication:   Family not at  Bedside Called legal guardian at this time no answer Disposition Plan:                            Back to current facility when stable                               Following barriers for discharge:                            Electrolytes corrected  Anemia corrected                             Pain controlled with PO medications                                                     Would benefit from PT/OT eval prior to DC  Ordered                   Swallow eval - SLP ordered                                    Transition of care consulted                   Nutrition    consulted                                     Palliative care    consulted                    Consults called: Neurosurgery is aware  Admission status:  ED Disposition     ED Disposition  Admit   Condition  --   [place]: [place] [[id number]]  Level of Care: Telemetry [5]  Admit to tele based on following criteria: Other see comments  Comments: hypokalemia  May place patient in observation at [facility] or [place] if equivalent level of care is available:: No  Covid Evaluation: Asymptomatic - no recent exposure (last 10 days) testing not required  Diagnosis: Subdural hematoma [facility]) [[id number]]  Admitting Physician: Toy Baker [[id number]]  Attending Physician: Toy Baker [[id number]]           Obs     Level of care     tele  For 12H   Lariza Cothron [place] 03/03/2022, 9:50 PM    [facility]     after 2 AM please page floor coverage PA If 7AM-7PM, please contact the day team taking care of the patient using [email or web address]   Patient was evaluated in the context of the global COVID-19 pandemic, which necessitated  consideration that the patient might be at risk for infection with the SARS-CoV-2 virus that causes COVID-19. Institutional protocols and algorithms that pertain to the evaluation of patients at risk for COVID-19 are in a state of rapid change based on information released by regulatory bodies including the CDC and federal and state organizations. These policies and algorithms were followed during the patient's care.

## 2022-03-04 ENCOUNTER — Ambulatory Visit: Payer: Medicare Other | Admitting: Neurology

## 2022-03-04 ENCOUNTER — Observation Stay (HOSPITAL_COMMUNITY): Payer: Medicare Other

## 2022-03-04 DIAGNOSIS — E440 Moderate protein-calorie malnutrition: Secondary | ICD-10-CM | POA: Diagnosis present

## 2022-03-04 DIAGNOSIS — R296 Repeated falls: Secondary | ICD-10-CM | POA: Diagnosis present

## 2022-03-04 DIAGNOSIS — S7012XA Contusion of left thigh, initial encounter: Secondary | ICD-10-CM | POA: Diagnosis not present

## 2022-03-04 DIAGNOSIS — D649 Anemia, unspecified: Secondary | ICD-10-CM | POA: Diagnosis present

## 2022-03-04 DIAGNOSIS — Z8673 Personal history of transient ischemic attack (TIA), and cerebral infarction without residual deficits: Secondary | ICD-10-CM | POA: Diagnosis not present

## 2022-03-04 DIAGNOSIS — N401 Enlarged prostate with lower urinary tract symptoms: Secondary | ICD-10-CM | POA: Diagnosis present

## 2022-03-04 DIAGNOSIS — I10 Essential (primary) hypertension: Secondary | ICD-10-CM | POA: Diagnosis present

## 2022-03-04 DIAGNOSIS — E860 Dehydration: Secondary | ICD-10-CM | POA: Diagnosis present

## 2022-03-04 DIAGNOSIS — Z888 Allergy status to other drugs, medicaments and biological substances status: Secondary | ICD-10-CM | POA: Diagnosis not present

## 2022-03-04 DIAGNOSIS — E041 Nontoxic single thyroid nodule: Secondary | ICD-10-CM | POA: Diagnosis present

## 2022-03-04 DIAGNOSIS — S2232XA Fracture of one rib, left side, initial encounter for closed fracture: Secondary | ICD-10-CM | POA: Diagnosis present

## 2022-03-04 DIAGNOSIS — Z515 Encounter for palliative care: Secondary | ICD-10-CM | POA: Diagnosis not present

## 2022-03-04 DIAGNOSIS — Z66 Do not resuscitate: Secondary | ICD-10-CM | POA: Diagnosis present

## 2022-03-04 DIAGNOSIS — Z23 Encounter for immunization: Secondary | ICD-10-CM | POA: Diagnosis present

## 2022-03-04 DIAGNOSIS — E785 Hyperlipidemia, unspecified: Secondary | ICD-10-CM | POA: Diagnosis present

## 2022-03-04 DIAGNOSIS — W19XXXA Unspecified fall, initial encounter: Secondary | ICD-10-CM | POA: Diagnosis present

## 2022-03-04 DIAGNOSIS — F0390 Unspecified dementia without behavioral disturbance: Secondary | ICD-10-CM | POA: Diagnosis present

## 2022-03-04 DIAGNOSIS — S065X0A Traumatic subdural hemorrhage without loss of consciousness, initial encounter: Secondary | ICD-10-CM | POA: Diagnosis not present

## 2022-03-04 DIAGNOSIS — S065XAA Traumatic subdural hemorrhage with loss of consciousness status unknown, initial encounter: Secondary | ICD-10-CM | POA: Diagnosis present

## 2022-03-04 DIAGNOSIS — Z9181 History of falling: Secondary | ICD-10-CM | POA: Diagnosis not present

## 2022-03-04 DIAGNOSIS — F03B3 Unspecified dementia, moderate, with mood disturbance: Secondary | ICD-10-CM | POA: Diagnosis not present

## 2022-03-04 DIAGNOSIS — Z79899 Other long term (current) drug therapy: Secondary | ICD-10-CM | POA: Diagnosis not present

## 2022-03-04 DIAGNOSIS — Z85820 Personal history of malignant melanoma of skin: Secondary | ICD-10-CM | POA: Diagnosis not present

## 2022-03-04 DIAGNOSIS — E876 Hypokalemia: Secondary | ICD-10-CM | POA: Diagnosis present

## 2022-03-04 DIAGNOSIS — N138 Other obstructive and reflux uropathy: Secondary | ICD-10-CM | POA: Diagnosis present

## 2022-03-04 DIAGNOSIS — S7002XA Contusion of left hip, initial encounter: Secondary | ICD-10-CM | POA: Diagnosis present

## 2022-03-04 DIAGNOSIS — Z87891 Personal history of nicotine dependence: Secondary | ICD-10-CM | POA: Diagnosis not present

## 2022-03-04 DIAGNOSIS — R7989 Other specified abnormal findings of blood chemistry: Secondary | ICD-10-CM | POA: Diagnosis present

## 2022-03-04 LAB — CBC
HCT: 34.4 % — ABNORMAL LOW (ref 39.0–52.0)
Hemoglobin: 11.7 g/dL — ABNORMAL LOW (ref 13.0–17.0)
MCH: 31.6 pg (ref 26.0–34.0)
MCHC: 34 g/dL (ref 30.0–36.0)
MCV: 93 fL (ref 80.0–100.0)
Platelets: 197 10*3/uL (ref 150–400)
RBC: 3.7 MIL/uL — ABNORMAL LOW (ref 4.22–5.81)
RDW: 13 % (ref 11.5–15.5)
WBC: 7.7 10*3/uL (ref 4.0–10.5)
nRBC: 0 % (ref 0.0–0.2)

## 2022-03-04 LAB — COMPREHENSIVE METABOLIC PANEL
ALT: 19 U/L (ref 0–44)
AST: 26 U/L (ref 15–41)
Albumin: 3.3 g/dL — ABNORMAL LOW (ref 3.5–5.0)
Alkaline Phosphatase: 52 U/L (ref 38–126)
Anion gap: 8 (ref 5–15)
BUN: 17 mg/dL (ref 8–23)
CO2: 21 mmol/L — ABNORMAL LOW (ref 22–32)
Calcium: 8.2 mg/dL — ABNORMAL LOW (ref 8.9–10.3)
Chloride: 111 mmol/L (ref 98–111)
Creatinine, Ser: 0.87 mg/dL (ref 0.61–1.24)
GFR, Estimated: >60 mL/min (ref >60)
Glucose, Bld: 98 mg/dL (ref 70–99)
Potassium: 3.4 mmol/L — ABNORMAL LOW (ref 3.5–5.1)
Sodium: 140 mmol/L (ref 135–145)
Total Bilirubin: 1.5 mg/dL — ABNORMAL HIGH (ref 0.3–1.2)
Total Protein: 5.7 g/dL — ABNORMAL LOW (ref 6.5–8.1)

## 2022-03-04 LAB — PREALBUMIN: Prealbumin: 8 mg/dL — ABNORMAL LOW (ref 18–38)

## 2022-03-04 LAB — PHOSPHORUS: Phosphorus: 3.3 mg/dL (ref 2.5–4.6)

## 2022-03-04 LAB — TROPONIN I (HIGH SENSITIVITY): Troponin I (High Sensitivity): 17 ng/L (ref <18)

## 2022-03-04 LAB — LACTIC ACID, PLASMA
Lactic Acid, Venous: 1.2 mmol/L (ref 0.5–1.9)
Lactic Acid, Venous: 1 mmol/L (ref 0.5–1.9)

## 2022-03-04 LAB — CK: Total CK: 225 U/L (ref 49–397)

## 2022-03-04 LAB — BILIRUBIN, DIRECT: Bilirubin, Direct: 0.3 mg/dL — ABNORMAL HIGH (ref 0.0–0.2)

## 2022-03-04 LAB — MAGNESIUM: Magnesium: 2.2 mg/dL (ref 1.7–2.4)

## 2022-03-04 LAB — ABO/RH: ABO/RH(D): A NEG

## 2022-03-04 MED ORDER — DONEPEZIL HCL 10 MG PO TABS
10.0000 mg | ORAL_TABLET | Freq: Every day | ORAL | Status: DC
  Administered 2022-03-05: 10 mg via ORAL
  Filled 2022-03-04 (×3): qty 1

## 2022-03-04 MED ORDER — LORATADINE 10 MG PO TABS
10.0000 mg | ORAL_TABLET | Freq: Every day | ORAL | Status: DC
  Administered 2022-03-04 – 2022-03-05 (×2): 10 mg via ORAL
  Filled 2022-03-04 (×2): qty 1

## 2022-03-04 MED ORDER — ACETAMINOPHEN 325 MG PO TABS: 650.0000 mg | ORAL_TABLET | Freq: Four times a day (QID) | ORAL | Status: DC | PRN

## 2022-03-04 MED ORDER — MEMANTINE HCL 10 MG PO TABS
10.0000 mg | ORAL_TABLET | Freq: Two times a day (BID) | ORAL | Status: DC
  Administered 2022-03-04 – 2022-03-07 (×5): 10 mg via ORAL
  Filled 2022-03-04 (×6): qty 1

## 2022-03-04 MED ORDER — HALOPERIDOL LACTATE 5 MG/ML IJ SOLN
1.0000 mg | Freq: Four times a day (QID) | INTRAMUSCULAR | Status: DC | PRN
  Administered 2022-03-04: 2 mg via INTRAVENOUS
  Filled 2022-03-04: qty 1

## 2022-03-04 MED ORDER — HYDROCODONE-ACETAMINOPHEN 5-325 MG PO TABS: 1.0000 | ORAL_TABLET | ORAL | Status: DC | PRN

## 2022-03-04 MED ORDER — ADULT MULTIVITAMIN W/MINERALS CH
1.0000 | ORAL_TABLET | Freq: Every day | ORAL | Status: DC
  Administered 2022-03-04 – 2022-03-07 (×3): 1 via ORAL
  Filled 2022-03-04 (×5): qty 1

## 2022-03-04 MED ORDER — DOCUSATE SODIUM 100 MG PO CAPS
100.0000 mg | ORAL_CAPSULE | Freq: Two times a day (BID) | ORAL | Status: DC
  Administered 2022-03-05 – 2022-03-07 (×3): 100 mg via ORAL
  Filled 2022-03-04 (×4): qty 1

## 2022-03-04 MED ORDER — LIP MEDEX EX OINT
TOPICAL_OINTMENT | CUTANEOUS | Status: DC | PRN
  Administered 2022-03-04: 75 via TOPICAL
  Filled 2022-03-04: qty 7

## 2022-03-04 MED ORDER — ACETAMINOPHEN 325 MG PO TABS
650.0000 mg | ORAL_TABLET | Freq: Four times a day (QID) | ORAL | Status: DC
  Administered 2022-03-04 – 2022-03-07 (×9): 650 mg via ORAL
  Filled 2022-03-04 (×10): qty 2

## 2022-03-04 MED ORDER — SODIUM CHLORIDE 0.9 % IV SOLN: INTRAVENOUS | Status: DC

## 2022-03-04 MED ORDER — QUETIAPINE FUMARATE 25 MG PO TABS
25.0000 mg | ORAL_TABLET | Freq: Every day | ORAL | Status: DC
  Administered 2022-03-05 – 2022-03-06 (×2): 25 mg via ORAL
  Filled 2022-03-04 (×3): qty 1

## 2022-03-04 MED ORDER — POTASSIUM CHLORIDE CRYS ER 20 MEQ PO TBCR
40.0000 meq | EXTENDED_RELEASE_TABLET | ORAL | Status: AC
  Filled 2022-03-04 (×2): qty 2

## 2022-03-04 MED ORDER — ACETAMINOPHEN 650 MG RE SUPP: 650.0000 mg | Freq: Four times a day (QID) | RECTAL | Status: DC | PRN

## 2022-03-04 MED ORDER — CITALOPRAM HYDROBROMIDE 20 MG PO TABS
10.0000 mg | ORAL_TABLET | Freq: Every day | ORAL | Status: DC
  Administered 2022-03-04 – 2022-03-07 (×4): 10 mg via ORAL
  Filled 2022-03-04 (×4): qty 1

## 2022-03-04 MED ORDER — TRAZODONE HCL 50 MG PO TABS
50.0000 mg | ORAL_TABLET | Freq: Every day | ORAL | Status: DC
  Filled 2022-03-04: qty 1

## 2022-03-04 MED ORDER — LORAZEPAM 2 MG/ML IJ SOLN: 0.5000 mg | Freq: Four times a day (QID) | INTRAMUSCULAR | Status: DC | PRN

## 2022-03-04 NOTE — ED Notes (Signed)
ED TO INPATIENT HANDOFF REPORT  ED Nurse Name and Phone #: Nicholes Mango Name/Age/Gender Jeffrey [name] 88 y.o. male Room/Bed: WHALD/WHALD  Code Status   Code Status: Prior  Home/SNF/Other Skilled nursing facility Patient oriented to: self and place Is this baseline? Yes   Triage Complete: Triage complete  Chief Complaint Subdural hematoma ([place]) [S06.5XAA]  Triage Note Pt coming from [facility] with c/o multiple falls over the last several days. Denies LOC, no blood thinners. Pt reports hitting his head with his fall today. Pt reports that he has been having ongoing dizziness causing his falls. Pt A&Ox3, hx dementia. Bruises visualized to pt's body, pt states that these are from his falls this week.   Allergies Allergies  Allergen Reactions   Flomax [Tamsulosin] Other (See Comments)    Flushing  Arm stiffness   Flonase [Fluticasone Propionate] Other (See Comments)    Epistaxis    Level of Care/Admitting Diagnosis ED Disposition     ED Disposition  Admit   Condition  --   Comment  Hospital Area: [facility] [[id number]]  Level of Care: Telemetry [5]  Admit to tele based on following criteria: Other see comments  Comments: hypokalemia  May place patient in observation at [facility] or [facility] if equivalent level of care is available:: No  Covid Evaluation: Asymptomatic - no recent exposure (last 10 days) testing not required  Diagnosis: Subdural hematoma [facility]) [[id number]]  Admitting Physician: Toy Baker [[id number]]  Attending Physician: Toy Baker [[id number]]          B Medical/Surgery History Past Medical History:  Diagnosis Date   Adenomatous polyp of colon 1995    w/ HGD   Advance care planning 12/29/2013   Advance directive- Sister [place] designated patient were incapacitated.   Allergic rhinitis    Bilateral cold feet 03/07/2020   BPH (benign prostatic hyperplasia)    COLONIC POLYPS, HX OF 08/24/2006    Qualifier: Diagnosis of  By: Council Mechanic MD, Hilaria Ota    Diverticulosis 09/21/2000   DIVERTICULOSIS, COLON 09/21/2000   Qualifier: Diagnosis of  By: Council Mechanic MD, Hilaria Ota    Dizziness 03/07/2020   ED (erectile dysfunction)    ED (erectile dysfunction) 11/14/2010   EXTERNAL HEMORRHOIDS 05/02/2010   Qualifier: Diagnosis of  By: Damita Dunnings MD, Phillip Heal     Hyperlipemia 05/05/1988   HYPERTROPHY PROSTATE W/UR OBST & OTH LUTS 10/09/2009   Pt uninterested in further testing.    Hypokalemia 05/11/2020   Hyponatremia 05/11/2020   Infected sebaceous cyst 01/25/2020   Knee pain 08/13/2011   Lung nodule 02/03/2019   Medicare annual wellness visit, subsequent 12/22/2011   Memory change 01/03/2016   Memory loss    Nasal sinus congestion 03/08/2019   Paresthesia of foot 08/13/2011   Rectal bleeding 09/26/2019   Schamberg's purpura    2012- eval by Dr. Allyson Sabal   Sebaceous cyst    Shoulder pain, right 08/24/2016   Skin cancer 05/05/1998   hx of Scalp-melanoma   TIA (transient ischemic attack)    Past Surgical History:  Procedure Laterality Date   Carotid U/S Nml  09/08/2007   Colon polyp  2011   B9 Int Hemms   Colonoscopy polyps  10/05/2003   Divertics in hemms   COLONOSCOPY W/ POLYPECTOMY  08/1993; 09/21/2000;10/05/2003   B9   Echo (other)  09/08/2007   nml lvf EF 50-55% Mild Diast Dysfctn Triv Ar   melanoma scalp  07/2003   SEPTOPLASTY  1995  Skin revision vertex of scalp  2009   Dr. Luetta Nutting, [place]       A IV Location/Drains/Wounds Patient Lines/Drains/Airways Status     Active Line/Drains/Airways     Name Placement date Placement time Site Days   Peripheral IV 03/03/22 20 G Left Antecubital 03/03/22  --  Antecubital  1   Wound / Incision (Open or Dehisced) 05/12/20 Non-pressure wound;Other (Comment) Ankle Anterior;Left bruised 05/12/20  0624  Ankle  661            Intake/Output Last 24 hours  Intake/Output Summary (Last 24 hours) at 03/04/2022 1125 Last data filed at 03/04/2022  0056 Gross per 24 hour  Intake 554.17 ml  Output --  Net 554.17 ml    Labs/Imaging Results for orders placed or performed during the hospital encounter of 03/03/22 (from the past 48 hour(s))  CBC     Status: Abnormal   Collection Time: 03/03/22  1:31 PM  Result Value Ref Range   WBC 8.5 4.0 - 10.5 K/uL   RBC 3.38 (L) 4.22 - 5.81 MIL/uL   Hemoglobin 10.8 (L) 13.0 - 17.0 g/dL   HCT 31.6 (L) 39.0 - 52.0 %   MCV 93.5 80.0 - 100.0 fL   MCH 32.0 26.0 - 34.0 pg   MCHC 34.2 30.0 - 36.0 g/dL   RDW 13.2 11.5 - 15.5 %   Platelets 184 150 - 400 K/uL   nRBC 0.0 0.0 - 0.2 %    Comment: Performed at [facility], [place] [address] [place], [place] [address]  Basic metabolic panel     Status: Abnormal   Collection Time: 03/03/22  3:46 PM  Result Value Ref Range   Sodium 137 135 - 145 mmol/L   Potassium 3.1 (L) 3.5 - 5.1 mmol/L   Chloride 110 98 - 111 mmol/L   CO2 21 (L) 22 - 32 mmol/L   Glucose, Bld 103 (H) 70 - 99 mg/dL    Comment: Glucose reference range applies only to samples taken after fasting for at least 8 hours.   BUN 25 (H) 8 - 23 mg/dL   Creatinine, Ser 1.01 0.61 - 1.24 mg/dL   Calcium 8.0 (L) 8.9 - 10.3 mg/dL   GFR, Estimated >60 >60 mL/min    Comment: (NOTE) Calculated using the CKD-EPI Creatinine Equation (2021)    Anion gap 6 5 - 15    Comment: Performed at [facility], [place] [address] [place], [place] [address]  POC occult blood, ED     Status: None   Collection Time: 03/03/22  4:31 PM  Result Value Ref Range   Fecal Occult Bld NEGATIVE NEGATIVE  Urinalysis, Routine w reflex microscopic Urine, Clean Catch     Status: Abnormal   Collection Time: 03/03/22  6:13 PM  Result Value Ref Range   Color, Urine YELLOW YELLOW   APPearance CLEAR CLEAR   Specific Gravity, Urine >1.046 (H) 1.005 - 1.030   pH 6.0 5.0 - 8.0   Glucose, UA NEGATIVE NEGATIVE mg/dL   Hgb urine dipstick NEGATIVE NEGATIVE   Bilirubin Urine NEGATIVE NEGATIVE    Ketones, ur 20 (A) NEGATIVE mg/dL   Protein, ur NEGATIVE NEGATIVE mg/dL   Nitrite NEGATIVE NEGATIVE   Leukocytes,Ua NEGATIVE NEGATIVE    Comment: Performed at [place] [address], [place], [place] [address]  Hemoglobin and hematocrit, blood     Status: Abnormal   Collection Time: 03/03/22  7:03 PM  Result Value  Ref Range   Hemoglobin 11.0 (L) 13.0 - 17.0 g/dL   HCT 32.6 (L) 39.0 - 52.0 %    Comment: Performed at [facility], [place] [address], [place], [place] [address]  CBC with Differential/Platelet     Status: Abnormal   Collection Time: 03/03/22  8:39 PM  Result Value Ref Range   WBC 7.2 4.0 - 10.5 K/uL   RBC 3.63 (L) 4.22 - 5.81 MIL/uL   Hemoglobin 11.6 (L) 13.0 - 17.0 g/dL   HCT 34.0 (L) 39.0 - 52.0 %   MCV 93.7 80.0 - 100.0 fL   MCH 32.0 26.0 - 34.0 pg   MCHC 34.1 30.0 - 36.0 g/dL   RDW 13.0 11.5 - 15.5 %   Platelets 191 150 - 400 K/uL   nRBC 0.0 0.0 - 0.2 %   Neutrophils Relative % 69 %   Neutro Abs 5.0 1.7 - 7.7 K/uL   Lymphocytes Relative 16 %   Lymphs Abs 1.2 0.7 - 4.0 K/uL   Monocytes Relative 13 %   Monocytes Absolute 0.9 0.1 - 1.0 K/uL   Eosinophils Relative 1 %   Eosinophils Absolute 0.0 0.0 - 0.5 K/uL   Basophils Relative 1 %   Basophils Absolute 0.0 0.0 - 0.1 K/uL   Immature Granulocytes 0 %   Abs Immature Granulocytes 0.02 0.00 - 0.07 K/uL    Comment: Performed at [facility], [place] [address] [place], [place] [address]  Lactic acid, plasma     Status: Abnormal   Collection Time: 03/03/22  8:39 PM  Result Value Ref Range   Lactic Acid, Venous 3.5 (HH) 0.5 - 1.9 mmol/L    Comment: CRITICAL RESULT CALLED TO, READ BACK BY AND VERIFIED WITH RIVERS,C RN AT 2136 ON 03/03/22 BY [id number] Performed at [facility], [place] [address], [place], [place] [address]   Protime-INR     Status: None   Collection Time: 03/03/22  8:39 PM  Result Value Ref Range   Prothrombin Time 14.0 11.4 -  15.2 seconds   INR 1.1 0.8 - 1.2    Comment: (NOTE) INR goal varies based on device and disease states. Performed at [facility], [place] [address] [place], [place] [address]   TSH     Status: None   Collection Time: 03/03/22  8:39 PM  Result Value Ref Range   TSH 2.008 0.350 - 4.500 uIU/mL    Comment: Performed by a 3rd Generation assay with a functional sensitivity of <=0.01 uIU/mL. Performed at [facility], [place] [address], [place], [place] [address]   Vitamin B12     Status: None   Collection Time: 03/03/22  8:39 PM  Result Value Ref Range   Vitamin B-12 911 180 - 914 pg/mL    Comment: (NOTE) This assay is not validated for testing neonatal or myeloproliferative syndrome specimens for Vitamin B12 levels. Performed at [facility], [place] [address], [place], [place] [address]   Folate     Status: None   Collection Time: 03/03/22  8:39 PM  Result Value Ref Range   Folate 25.5 >5.9 ng/mL    Comment: RESULT CONFIRMED BY MANUAL DILUTION Performed at [place] [address], [place], [place] [address]   Iron and TIBC     Status: Abnormal   Collection Time: 03/03/22  8:39 PM  Result Value Ref Range   Iron 39 (L) 45 - 182 ug/dL   TIBC 302  250 - 450 ug/dL   Saturation Ratios 13 (L) 17.9 - 39.5 %   UIBC 263 ug/dL    Comment: Performed at [facility], [place] [address], [place], [place] [address]  Ferritin     Status: None   Collection Time: 03/03/22  8:39 PM  Result Value Ref Range   Ferritin 38 24 - 336 ng/mL    Comment: Performed at [facility], [place] [address], [place], [place] [address]  Reticulocytes     Status: Abnormal   Collection Time: 03/03/22  8:39 PM  Result Value Ref Range   Retic Ct Pct 2.7 0.4 - 3.1 %   RBC. 3.60 (L) 4.22 - 5.81 MIL/uL   Retic Count, Absolute 95.4 19.0 - 186.0 K/uL   Immature Retic Fract 27.3 (H) 2.3 - 15.9 %    Comment:  Performed at [facility], [place] [address] [place], [place] [address]  Type and screen [place]     Status: None   Collection Time: 03/03/22  8:39 PM  Result Value Ref Range   ABO/RH(D) A NEG    Antibody Screen NEG    Sample Expiration      03/06/2022,2359 Performed at [facility], [place] [address], [place], [place] [address]   Blood gas, venous     Status: Abnormal   Collection Time: 03/03/22 10:40 PM  Result Value Ref Range   pH, Ven 7.48 (H) 7.25 - 7.43   pCO2, Ven 28 (L) 44 - 60 mmHg   pO2, Ven 46 (H) 32 - 45 mmHg   Bicarbonate 20.9 20.0 - 28.0 mmol/L   Acid-base deficit 1.6 0.0 - 2.0 mmol/L   O2 Saturation 81.1 %   Patient temperature 37.0     Comment: Performed at [facility], [place] [address], [place], [place] [address]  Lactic acid, plasma     Status: Abnormal   Collection Time: 03/03/22 10:54 PM  Result Value Ref Range   Lactic Acid, Venous 2.1 (HH) 0.5 - 1.9 mmol/L    Comment: CRITICAL VALUE NOTED. VALUE IS CONSISTENT WITH PREVIOUSLY REPORTED/CALLED VALUE Performed at [place] [address] [place], [place] [address]   Lactic acid, plasma     Status: None   Collection Time: 03/04/22 12:25 AM  Result Value Ref Range   Lactic Acid, Venous 1.2 0.5 - 1.9 mmol/L    Comment: Performed at [facility], [place] [address], [place], [place] [address]  Lactic acid, plasma     Status: None   Collection Time: 03/04/22  3:03 AM  Result Value Ref Range   Lactic Acid, Venous 1.0 0.5 - 1.9 mmol/L    Comment: Performed at [facility], [place] [address], [place], [place] [address]  Prealbumin     Status: Abnormal   Collection Time: 03/04/22  3:15 AM  Result Value Ref Range   Prealbumin 8 (L) 18 - 38 mg/dL    Comment: Performed at [place] [address] [place], [place] [address]   CT HEAD WO CONTRAST (5MM)  Result Date:  03/04/2022 CLINICAL DATA:  88 year old male with multiple falls. Small left subdural hematoma. Subsequent encounter. EXAM: CT HEAD WITHOUT CONTRAST TECHNIQUE: Contiguous axial images were obtained from the base of the skull through the vertex without intravenous contrast. RADIATION DOSE REDUCTION: This exam was performed according to the departmental dose-optimization program which includes automated exposure control, adjustment of the mA and/or  kV according to patient size and/or use of iterative reconstruction technique. COMPARISON:  Head CT 03/03/2022. FINDINGS: Brain: Increased conspicuity of bilateral dural vein displacement away from the inner table of the skull and associated low-density fluid in keeping with low-density bilateral subdural hematoma or hygroma. These are new since 2021. See series 4, image 39. The bilateral fluid collections are most apparent over the lateral and superior convexities and measure 5 mm at most levels bilaterally. Along the left lateral frontal convexity the left subdural collection is larger measuring 7-8 mm. And as on the CT yesterday, there does appear to be a small component of hyperdense left subdural blood along the left middle cranial fossa. See coronal image 26. Only mild intracranial mass effect with rightward midline shift of 3 mm (note cavum septum pellucidum). Basilar cisterns remain patent. Incidental midline pericallosal intracranial lipoma (normal variant). No superimposed ventriculomegaly, evidence of mass lesion, or evidence of cortically based acute infarction. Stable gray-white matter differentiation throughout the brain. Vascular: Calcified atherosclerosis at the skull base. Skull: Stable and intact. Sinuses/Orbits: Visualized paranasal sinuses and mastoids are clear. Other: No acute orbit or scalp soft tissue finding. IMPRESSION: 1. Positive for Bilateral Subdural Fluid Collections, new since 2021 but not significantly changed from yesterday: - mixed density  left side subdural hematoma is 5-8 mm in thickness. - low-density right side subdural hematoma versus hygroma is 5 mm. 2. Only minimal intracranial mass effect. Rightward midline shift of 3 mm. Basilar cisterns remain patent. 3. No skull fracture or other acute intracranial abnormality. Electronically Signed   By: Genevie Ann M.D.   On: 03/04/2022 07:35   CT Head Wo Contrast  Addendum Date: 03/03/2022   ADDENDUM REPORT: 03/03/2022 18:35 ADDENDUM: The original report was by Dr. Van Clines. The following addendum is by Dr. Van Clines: Critical Value/emergent results were called by telephone at the time of interpretation on 03/03/2022 at 5:27 pm to provider Dr. Ronnald Nian, Who verbally acknowledged these results. Electronically Signed   By: Van Clines M.D.   On: 03/03/2022 18:35   Result Date: 03/03/2022 CLINICAL DATA:  Head trauma, multiple falls. Patient struck head today. Ongoing dizziness. EXAM: CT HEAD WITHOUT CONTRAST TECHNIQUE: Contiguous axial images were obtained from the base of the skull through the vertex without intravenous contrast. RADIATION DOSE REDUCTION: This exam was performed according to the departmental dose-optimization program which includes automated exposure control, adjustment of the mA and/or kV according to patient size and/or use of iterative reconstruction technique. COMPARISON:  06/09/2019 FINDINGS: Brain: Notable bilateral fluid density extra-axial fluid collections, probably with a component of subdural hygroma although there are some crossing vessels which raise the possibility of benign expansion of the subarachnoid space. Small acute or subacute subdural hematoma anterolaterally in the left middle cranial fossa on images 10 through 12 of series 3, up to 0.4 cm in thickness. This appearance was not present on 06/09/2019. 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 Otherwise, the brainstem, cerebellum, cerebral peduncles, thalamus, basal ganglia, basilar  cisterns, and ventricular system appear within normal limits. No acute CVA identified. Vascular: There is atherosclerotic calcification of the cavernous carotid arteries bilaterally. Skull: Unremarkable Sinuses/Orbits: Unremarkable Other: Periapical lucency along a right maxillary premolar. IMPRESSION: 1. Small acute or subacute subdural hematoma anterolaterally in the left middle cranial fossa, up to 0.4 cm in thickness. 2. Notable bilateral fluid density extra-axial fluid collections, probably with a component of subdural hygroma although there are some crossing vessels which raise the possibility of benign expansion of the subarachnoid  space. 3. Stable left pericallosal lipoma. 4. Periapical lucency along a right maxillary premolar. 5. Atherosclerosis. Radiology assistant personnel have been notified to put me in telephone contact with the referring physician or the referring physician's clinical representative in order to discuss these findings. Once this communication is established I will issue an addendum to this report for documentation purposes. Electronically Signed: By: Van Clines M.D. On: 03/03/2022 17:21   CT ABDOMEN PELVIS W CONTRAST  Result Date: 03/03/2022 CLINICAL DATA:  Multiple falls, ongoing dizziness. Bruising along the body. EXAM: CT ABDOMEN AND PELVIS WITH CONTRAST TECHNIQUE: Multidetector CT imaging of the abdomen and pelvis was performed using the standard protocol following bolus administration of intravenous contrast. RADIATION DOSE REDUCTION: This exam was performed according to the departmental dose-optimization program which includes automated exposure control, adjustment of the mA and/or kV according to patient size and/or use of iterative reconstruction technique. CONTRAST:  179m OMNIPAQUE IOHEXOL 300 MG/ML  SOLN COMPARISON:  03/05/2018 FINDINGS: Lower chest: Descending thoracic aortic atherosclerosis. Small to moderate-sized hiatal hernia. Right anterior descending  coronary artery atherosclerotic vascular disease. Old healed right posterolateral rib fractures. Tiny pulmonary nodules in the 3-4 mm range in the lung bases are stable and considered benign. These do not warrant further follow up. Hepatobiliary: Motion artifact obscures portions of the liver. Those portions not obscured or skipped by motion artifact appear unremarkable. The gallbladder appears grossly normal. Pancreas: Unremarkable Spleen: Motion artifacts cures the upper portion of the spleen. Otherwise unremarkable. Adrenals/Urinary Tract: Unremarkable Stomach/Bowel: Hiatal hernia is noted above. Vascular/Lymphatic: Atherosclerosis is present, including aortoiliac atherosclerotic disease. There is some atheromatous plaque proximally in the superior mesenteric artery without occlusion. No pathologic adenopathy. Reproductive: Prostatomegaly, prostate gland 6.0 by 4.9 by 6.7 cm (volume = 100 cm^3) with some heterogeneous enhancement in the peripheral zone especially near the apex, nonspecific although corroboration with PSA level suggested. Other: No supplemental non-categorized findings. Musculoskeletal: Substantial bruising and partially visualized hematoma in the subcutaneous tissues lateral to the left hip and proximal femur as shown for example on images 75-92 of series 4. This is primarily centered in the subcutaneous tissues and not directly along the superficial fascia margin to necessarily indicate a Morel Lavallee lesion. Subcutaneous edema posterior to the distal gluteus maximus muscle without visible underlying fracture of the left hip. Degenerative facet arthropathy in the lower lumbar spine. Possible left lateral recess disc protrusion at L3-4 extending caudad. IMPRESSION: 1. Substantial bruising and partially visualized hematoma in the subcutaneous tissues lateral to the left hip and proximal femur. This is primarily centered in the subcutaneous tissues and not directly along the superficial fascia  margin to necessarily indicate a Morel Lavallee lesion. 2. Subcutaneous edema posterior to the distal gluteus maximus muscle without visible underlying fracture of the left hip. 3. Possible left lateral recess disc protrusion at L3-4 extending caudad. 4. Prostatomegaly, prostate gland volume 100 cc. Mildly heterogeneous peripheral zone enhancement apically, correlate with PSA levels. 5. Small to moderate-sized hiatal hernia. 6. Old healed right posterolateral rib fractures. 7. Aortic atherosclerosis. Aortic Atherosclerosis (ICD10-I70.0). Electronically Signed   By: WVan ClinesM.D.   On: 03/03/2022 17:20   CT Cervical Spine Wo Contrast  Result Date: 03/03/2022 CLINICAL DATA:  Trauma EXAM: CT CERVICAL SPINE WITHOUT CONTRAST TECHNIQUE: Multidetector CT imaging of the cervical spine was performed without intravenous contrast. Multiplanar CT image reconstructions were also generated. RADIATION DOSE REDUCTION: This exam was performed according to the departmental dose-optimization program which includes automated exposure control, adjustment of the mA and/or  kV according to patient size and/or use of iterative reconstruction technique. COMPARISON:  None Available. FINDINGS: Alignment: There is trace anterolisthesis at C7-T1 which is favored is degenerative. Alignment is otherwise anatomic. Skull base and vertebrae: The bones are osteopenic. No acute fracture. No primary bone lesion or focal pathologic process. Soft tissues and spinal canal: No prevertebral fluid or swelling. No visible canal hematoma. Disc levels: There is mild-to-moderate disc space narrowing throughout the cervical spine most significant at C3-C4, C5-C6 and C6-C7. There is no severe central canal or neural foraminal stenosis identified at any level. No significant central canal or neural foraminal stenosis at any level. Upper chest: Negative. Other: There is a 2.3 cm inferior left thyroid nodule. There are calcifications in the left thyroid  gland. IMPRESSION: 1. No acute fracture or traumatic subluxation of the cervical spine. 2. Mild-to-moderate degenerative changes. 3. 2.3 cm incidental left thyroid nodule. Recommend non-emergent thyroid ultrasound if clinically warranted given patient age. Reference: J Am Coll Radiol. 2015 Feb;12(2): 143-50 Electronically Signed   By: Ronney Asters M.D.   On: 03/03/2022 16:55   DG Elbow Complete Left  Result Date: 03/03/2022 CLINICAL DATA:  Fall.  Pain. EXAM: LEFT ELBOW - COMPLETE 3+ VIEW COMPARISON:  None Available. FINDINGS: There is diffuse decreased bone mineralization. Unfortunately IV catheter tubing overlies the distal anterior humeral fat pad on lateral view. Within this limitation, no definite elevation of the fat pad to indicate a joint effusion. No visualization of the posterior fat pad to indicate joint effusion. No acute fracture is seen. No dislocation. Joint spaces are preserved. IMPRESSION: No acute fracture is seen. Electronically Signed   By: Yvonne Kendall M.D.   On: 03/03/2022 15:12   DG Hip Unilat W or Wo Pelvis 2-3 Views Left  Result Date: 03/03/2022 CLINICAL DATA:  Multiple falls over the past few days.  Pain. EXAM: DG HIP (WITH OR WITHOUT PELVIS) 2-3V LEFT COMPARISON:  KUB 05/11/2019 FINDINGS: Mild to moderate pubic symphysis joint space narrowing, subchondral sclerosis, and peripheral osteophytosis. Mild bilateral superior femoroacetabular joint space narrowing. Unchanged small sclerotic focus within the left intertrochanteric region, likely a benign bone island. Similar likely small sclerotic bone island within the right femoral neck, unchanged from prior. No acute fracture or dislocation. IMPRESSION: 1. No acute fracture. 2. 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 Electronically Signed   By: Yvonne Kendall M.D.   On: 03/03/2022 15:11   DG Shoulder Left  Result Date: 03/03/2022 CLINICAL DATA:  Multiple falls over the past several days. EXAM: LEFT  SHOULDER - 2+ VIEW COMPARISON:  None Available. FINDINGS: Mild inferior glenoid degenerative osteophytosis. Mild acromioclavicular joint space narrowing and peripheral osteophytosis. No acute fracture or dislocation. The visualized portion of the left lung is unremarkable. IMPRESSION: Mild acromioclavicular and glenohumeral osteoarthritis. No acute fracture. Electronically Signed   By: Yvonne Kendall M.D.   On: 03/03/2022 15:09   DG Ribs Unilateral W/Chest Left  Result Date: 03/03/2022 CLINICAL DATA:  Multiple falls in past several days. EXAM: LEFT RIBS AND CHEST - 3+ VIEW COMPARISON:  Chest two views 07/25/2021 FINDINGS: Negative. Cardiac silhouette and mediastinal contours are within normal limits. Mild calcification within aortic arch. Unchanged mild biapical pleural thickening/scarring. The lungs are clear. No pleural effusion pneumothorax. Mild-to-moderate multilevel disc space narrowing and endplate osteophytes of the thoracic spine. Unchanged elevation of the right clavicular head with respect to the right acromion. Ectopic ossification is again seen along the right coracohumeral ligament, likely remote ligamentous tear. The left acromioclavicular joint  space is appropriately aligned. There is an oblique linear lucency with minimal 1-2 mm cortical step-off indicating a minimally displaced fracture of the lateral left fifth rib. IMPRESSION: 1. Minimally displaced acute fracture of the lateral left fifth rib. 2. No pneumothorax. 3. 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 Electronically Signed   By: Yvonne Kendall M.D.   On: 03/03/2022 15:08    Pending Labs Unresulted Labs (From admission, onward)     Start     Ordered   03/04/22 0100  ABO/Rh  Once,   R        03/04/22 0100   03/03/22 2100  CK  Once,   R        03/03/22 2100   03/03/22 2100  Hepatic function panel  Once,   R        03/03/22 2100    03/03/22 2100  Magnesium  Once,   R        03/03/22 2100   03/03/22 2100  Phosphorus  Once,   R        03/03/22 2100   03/03/22 6812  Basic metabolic panel  Once,   R        03/03/22 2026   Signed and Held  Comprehensive metabolic panel  Tomorrow morning,   R       Question:  Release to patient  Answer:  Immediate   Signed and Held   Signed and Held  CBC  Tomorrow morning,   R       Question:  Release to patient  Answer:  Immediate   Signed and Held   Signed and Held  Magnesium  Tomorrow morning,   R        Signed and Held   Signed and Held  Phosphorus  Tomorrow morning,   R        Signed and Held            Vitals/Pain Today's Vitals   03/04/22 0400 03/04/22 0453 03/04/22 0501 03/04/22 1100  BP: (!) 109/55 (!) 129/54  (!) 126/58  Pulse: 61 61  63  Resp: '19 19  18  '$ Temp:   98.2 F (36.8 C) 98.3 F (36.8 C)  TempSrc:   Oral Oral  SpO2: 98% 97%  96%  PainSc:    0-No pain    Isolation Precautions No active isolations  Medications Medications  influenza vaccine adjuvanted (FLUAD) injection 0.5 mL (has no administration in time range)  acetaminophen (TYLENOL) tablet 650 mg (650 mg Oral Given 03/03/22 1323)  iohexol (OMNIPAQUE) 300 MG/ML solution 100 mL (100 mLs Intravenous Contrast Given 03/03/22 1628)  sodium chloride (PF) 0.9 % injection (  Given by Other 03/03/22 1709)  potassium chloride SA (KLOR-CON M) CR tablet 40 mEq (40 mEq Oral Given 03/03/22 1656)  sodium chloride 0.9 % bolus 1,000 mL (0 mLs Intravenous Stopped 03/04/22 0056)    Mobility walks with device Moderate fall risk   Focused Assessments primary   R Recommendations: See Admitting Provider Note  Report given to:   Additional Notes:

## 2022-03-04 NOTE — Evaluation (Signed)
Physical Therapy Evaluation Patient Details Name: Jeffrey [name] MRN: [id number] DOB: 1929-03-02 Today's Date: 03/04/2022  History of Present Illness  This is a 88 year old male with dementia, TIA, memory loss, covid, BPH who presents after having mild multiple falls.CT of the head revealed small acute versus subacute subdural hematomas and possible subdural hygroma.  CT of the abdomen and pelvis reveals a left hip hematoma. Chest x-ray minimally displaced acute fracture of the fifth rib  Clinical Impression  Pt admitted with above diagnosis.  Mobility limited by cognition, unable to redirect pt, fixated on calling his attorney. Pt stood from EOB as soon as therapist left the perimeter of the room, assisted pt in taking a few steps  to chair (which is where he said he was going). Pt extremely unsteady with transfer (also standing on fall mat making his balance worse d/t unstable surface).   Recommend SNF as pt is at risk for further falls d/t decr cognition, weakness and impaired balance.    Pt currently with functional limitations due to the deficits listed below (see PT Problem List). Pt will benefit from skilled PT to increase their independence and safety with mobility to allow discharge to the venue listed below.          Recommendations for follow up therapy are one component of a multi-disciplinary discharge planning process, led by the attending physician.  Recommendations may be updated based on patient status, additional functional criteria and insurance authorization.  Follow Up Recommendations Skilled nursing-short term rehab (<3 hours/day) Can patient physically be transported by private vehicle: No    Assistance Recommended at Discharge Frequent or constant Supervision/Assistance  Patient can return home with the following  A little help with walking and/or transfers;A little help with bathing/dressing/bathroom;Assistance with cooking/housework;Assist for  transportation;Direct supervision/assist for financial management;Help with stairs or ramp for entrance;Direct supervision/assist for medications management    Equipment Recommendations None recommended by PT  Recommendations for Other Services       Functional Status Assessment Patient has had a recent decline in their functional status and demonstrates the ability to make significant improvements in function in a reasonable and predictable amount of time.     Precautions / Restrictions Precautions Precautions: Fall      Mobility  Bed Mobility Overal bed mobility: Needs Assistance Bed Mobility: Supine to Sit     Supine to sit: Supervision     General bed mobility comments: for safety    Transfers Overall transfer level: Needs assistance Equipment used: None Transfers: Sit to/from Stand Sit to Stand: Min guard           General transfer comment: pt stands without assist, wide BOS, extremely unsteady    Ambulation/Gait Ambulation/Gait assistance: Min assist Gait Distance (Feet): 5 Feet Assistive device: Rolling walker (2 wheels) Gait Pattern/deviations: Wide base of support, Step-through pattern       General Gait Details: pt extremely unsteady  Stairs            Wheelchair Mobility    Modified Rankin (Stroke Patients Only)       Balance Overall balance assessment: Needs assistance, History of Falls Sitting-balance support: Feet supported, No upper extremity supported, Feet unsupported Sitting balance-Leahy Scale: Good     Standing balance support: During functional activity Standing balance-Leahy Scale: Poor Standing balance comment: reliant on external assist  Pertinent Vitals/Pain Pain Assessment Pain Assessment: No/denies pain (multiple bruises and contusions)    Home Living Family/patient expects to be discharged to:: Unsure                   Additional Comments: pt is from memory care  [facility] place    Prior Function Prior Level of Function : Independent/Modified Independent             Mobility Comments: pt reports independence       Hand Dominance        Extremity/Trunk Assessment   Upper Extremity Assessment Upper Extremity Assessment: Overall WFL for tasks assessed    Lower Extremity Assessment Lower Extremity Assessment: RLE deficits/detail;LLE deficits/detail; generalized weakness RLE Coordination: decreased gross motor LLE Coordination: decreased gross motor    Cervical / Trunk Assessment Cervical / Trunk Assessment: Normal  Communication   Communication: No difficulties  Cognition Arousal/Alertness: Awake/alert Behavior During Therapy: Restless (easily agititated) Overall Cognitive Status: History of cognitive impairments - at baseline Area of Impairment: Orientation, Attention, Memory, Safety/judgement, Following commands, Problem solving                 Orientation Level: Disoriented to, Place, Time, Situation Current Attention Level: Focused Memory: Decreased short-term memory, Decreased recall of precautions Following Commands: Follows one step commands inconsistently, Follows multi-step commands inconsistently Safety/Judgement: Decreased awareness of deficits, Decreased awareness of safety     General Comments: pt is tangential, requires redirection, does not follow commands and is perseeratign on calling his attorney        General Comments      Exercises     Assessment/Plan    PT Assessment Patient needs continued PT services  PT Problem List Decreased mobility;Decreased safety awareness;Decreased knowledge of precautions;Decreased balance;Decreased knowledge of use of DME;Decreased cognition       PT Treatment Interventions DME instruction;Therapeutic exercise;Gait training;Functional mobility training;Therapeutic activities;Patient/family education    PT Goals (Current goals can be found in the Care Plan  section)  Acute Rehab PT Goals PT Goal Formulation: With patient Time For Goal Achievement: 03/18/22 Potential to Achieve Goals: Fair    Frequency Min 2X/week     Co-evaluation               AM-PAC PT "6 Clicks" Mobility  Outcome Measure Help needed turning from your back to your side while in a flat bed without using bedrails?: A Little Help needed moving from lying on your back to sitting on the side of a flat bed without using bedrails?: A Little Help needed moving to and from a bed to a chair (including a wheelchair)?: A Little Help needed standing up from a chair using your arms (e.g., wheelchair or bedside chair)?: A Little Help needed to walk in hospital room?: A Lot Help needed climbing 3-5 steps with a railing? : A Lot 6 Click Score: 16    End of Session   Activity Tolerance: Treatment limited secondary to agitation Patient left: with call bell/phone within reach;in chair;with family/visitor present;with chair alarm set Nurse Communication: Mobility status (NT notified pt in chair) PT Visit Diagnosis: Other abnormalities of gait and mobility (R26.89);History of falling (Z91.81)    Time: 8413-2440 PT Time Calculation (min) (ACUTE ONLY): 17 min   Charges:   PT Evaluation $PT Eval Low Complexity: [place], PT  Acute Rehab Dept [facility]) [phone]  [facility] Weekend Pager [facility] only)  [phone]  03/04/2022  [facility] 03/04/2022, 4:56 PM

## 2022-03-04 NOTE — ED Notes (Signed)
Pt guardian, [name] at bedside updated with poc.

## 2022-03-04 NOTE — Progress Notes (Addendum)
[facility] Progress Note  Patient: Jeffrey [name]     EHM:[id number]  DOA: 03/03/2022   PCP: Christiana Fuchs, DO       Brief hospital course: This is a 88 year old male with dementia and BPH who presents after having mild multiple falls.  In the ED:  CT of the head revealed small acute versus subacute subdural hematomas and possible subdural hygroma CT of the abdomen and pelvis reveals a left hip hematoma  Chest x-ray minimally displaced acute fracture of the fifth rib. Lactic acid was noted to be 3.9 Potassium 3.1  Neurosurgery was asked to review images.  Recommended follow-up scan and consult if hemorrhage appears increased.   Subjective:  He is quite confused. No complaints of chest pain or headache. No other complaints.   Assessment and Plan: Principal Problem:    Acute (very small) Left Subdural hematoma - superimposed on chronic right subdural hematomas with minimal mass effect (HCC) Frequent falls - I have reviewed the CT of the head with Dr Trenton Gammon today and he does not feel there is any need for further interventions  - no c/o headaches- no other complaints - he lives in an ALF and we are awaiting a PT eval to see if he needs a higher level of care  Active Problems: Left hip hematoma - cont to follow- he does not have any pain currently - Hgb will need to be checked again tomorrow    Elevated lactic acid level -Improved to 1.0  Dehydration - BUN/ Cr ratio> 20 - U specific gravity elevated - given 1 L NS in ED and ordered continuous IVF - follow in AM    Hypokalemia - replaced but still low- replace again today    Dementia ([place]) - confused the time, place and situation    Rib fracture- acute vs chronic  - acute fracture of the fifth rib on CXR but CT abd/pelvis notes> Old healed right posterolateral rib fractures.  - he has no chest pain  Left thyroid nodule - incidental finding on CT- outpt w/u    Anemia, normocytic - Hgb ~ 11-12 -  anemia panel reviewed and unremarkable - FOB neg     Code Status: Prior DVT prophylaxis:  SCDs Start: 03/04/22 1216  Consultants: none Level of Care: Level of care: Telemetry Total time on patient care: 35 min   Objective:   Vitals:   03/04/22 0102 03/04/22 0400 03/04/22 0453 03/04/22 0501  BP: (!) 122/58 (!) 109/55 (!) 129/54   Pulse: 66 61 61   Resp: (!) '22 19 19   '$ Temp: 98.4 F (36.9 C)   98.2 F (36.8 C)  TempSrc: Oral   Oral  SpO2: 95% 98% 97%    There were no vitals filed for this visit. Exam: General exam: Appears comfortable  HEENT: oral mucosa moist Respiratory system: Clear to auscultation.  Cardiovascular system: S1 & S2 heard  Gastrointestinal system: Abdomen soft, non-tender, nondistended. Normal bowel sounds   Extremities: No cyanosis, clubbing or edema Psychiatry:  Mood & affect appropriate.  Very confused.  Imaging and lab data was personally reviewed    CBC: Recent Labs  Lab 03/03/22 1331 03/03/22 1903 03/03/22 2039  WBC 8.5  --  7.2  NEUTROABS  --   --  5.0  HGB 10.8* 11.0* 11.6*  HCT 31.6* 32.6* 34.0*  MCV 93.5  --  93.7  PLT 184  --  366   Basic Metabolic Panel: Recent Labs  Lab 03/03/22 1546  NA 137  K 3.1*  CL 110  CO2 21*  GLUCOSE 103*  BUN 25*  CREATININE 1.01  CALCIUM 8.0*   GFR: CrCl cannot be calculated (Unknown ideal weight.).  Scheduled Meds:  influenza vaccine adjuvanted  0.5 mL Intramuscular Tomorrow-1000   Continuous Infusions:   LOS: 0 days   Author: Debbe Odea  03/04/2022 8:51 AM

## 2022-03-04 NOTE — Progress Notes (Signed)
Severely agitated, calling 911 and telling 911 operator "they" are trying to kill him and to notify the FBI. Called security and charge nurse with total 3 nurses to room and technician. Dr Wynelle Cleveland notified, Haldol given without effect. Patient confused, uncooperative. Security or nurses unable to reassure and reorient patient, his agitation escalating and restraints applied for patient safety and to administer medical therapy, iv fluids and medications. Patient combative, yelling. Dr Wynelle Cleveland again notified. Lawernce Keas notified of increase agitation, confusion, combativeness and restraints applied.

## 2022-03-04 NOTE — Consult Note (Signed)
Consultation Note Date: 03/04/2022   Patient Name: Jeffrey [name]  DOB: [birth year]  MRN: [id number]  Age / Sex: 88 y.o., male  PCP: Christiana Fuchs, DO Referring Physician: Debbe Odea, MD  Reason for Consultation: Establish Goals of Care  HPI/Patient Profile: 88 y.o. male  with past medical history of dementia, previous TIA, hyperlipidemia, chronic constipation, and a remote history of melanoma admitted from [facility] on 03/03/2022 with dizziness and multiple falls over the past few days. Patient reports having hit his head with fall that occurred on 10/30. Patient has multiple bruises in various stages to his left side - temple, shoulder, elbow, chest, LLQ, and hip. Not currently on blood thinners.  Primary Decision Maker LEGAL GUARDIAN Jeffrey [name] with [employer] - can be reached at: Direct Cell [phone] / Office [phone].  Discussion: Patient was seen earlier this morning in the Emergency Department by Dr. Hilma [name]. Unsuccessful attempts to reach guardian.  Student NP presents to room for visit. Jeffrey [name] at bedside. Patient observed alert and confused. Speech vacillates between calm and being pressured with agitation. Intermittently paranoid.   [name] is from [place] [facility]) and explains that there was a court hearing back in September that transferred patient's care from [place] to [facility]. [name] reports that patient's dementia was such that he was unsafe living alone in his home. Patient's car keys were taken and he was admitted into a memory care unit at [facility] for his own safety.  Discussed patient's current medical status with guardian and he agrees that condition is fragile. Discussed CODE STATUS and that resuscitative efforts and ventilation would not be a bridge to better health for patient. [name] team has located  documents within the patient's home and are currently reviewing the paperwork to determine patient's wishes. [name] states that his team wants to act ethically on behalf of patient. [name] to update his boss and be in contact with hospital team to advise of code status. Patient status FULL CODE for now.   [name] welcomes a phone call day/night if hospital staff have questions/concerns. He expresses appreciation for all that hospital staff are doing to help patient.    SUMMARY OF RECOMMENDATIONS   Code Status/Advance Care Planning: Full code  PMT to follow for continued discussions on code status.  Recommend Outpatient Palliative Care services upon return to facility.   Prognosis:  Months to years  Discharge Planning: Guardian anticipates return to [facility]. PMT recommending Outpatient Palliative Care services to follow.  Primary Diagnoses: Present on Admission:  Subdural hematoma (HCC)  Dementia (HCC)  Rib fracture  Anemia  Hypokalemia  Elevated lactic acid level   ROS Patient disoriented x 4 at time of visit.  Physical Exam HENT:     Mouth/Throat:     Mouth: Mucous membranes are dry.  Pulmonary:     Effort: Pulmonary effort is normal.     Breath sounds: Normal breath sounds.  Abdominal:     Palpations: Abdomen is soft.  Skin:    Findings: Bruising present.  Neurological:     Mental Status: He is alert. He is disoriented.  Psychiatric:     Comments: Intermittent agitation, paranoia, and pressured speech.    Vital Signs: BP (!) 140/52   Pulse 61   Temp 98.2 F (36.8 C) (Oral)   Resp 18   SpO2 100%  Pain Scale: 0-10   Pain Score: 0-No pain   SpO2: SpO2: 100 % O2 Device:SpO2: 100 % O2 Flow Rate: on room air   IO: Intake/output summary:  Intake/Output Summary (Last 24 hours) at 03/04/2022 1653 Last data filed at 03/04/2022 0056 Gross per 24 hour  Intake 554.17 ml  Output --  Net 554.17 ml    Thank you for this consult.  Palliative medicine will continue to follow and assist as needed.   Signed by: Moss Mc, RN MSN [facility] / NP Student Palliative Medicine   Please contact Palliative Medicine Team phone at [phone] for questions and concerns.  For individual provider: See Shea Evans

## 2022-03-04 NOTE — Progress Notes (Signed)
Patient stating he does not want to go back to that place [facility]) because they have been trying to kill him and "that is why I am in such bad shape, because of them", and he wishes to go home after discharge from the hospital.

## 2022-03-04 NOTE — Progress Notes (Signed)
Pt doesn't want RN to check physical assessment and refused taking all night time medications. Pt is alert and oriented by himself. Disoriented time, place and situation. Will continue to monitor.

## 2022-03-05 DIAGNOSIS — E440 Moderate protein-calorie malnutrition: Secondary | ICD-10-CM | POA: Diagnosis present

## 2022-03-05 DIAGNOSIS — S065XAA Traumatic subdural hemorrhage with loss of consciousness status unknown, initial encounter: Secondary | ICD-10-CM | POA: Diagnosis not present

## 2022-03-05 LAB — PHOSPHORUS: Phosphorus: 3.2 mg/dL (ref 2.5–4.6)

## 2022-03-05 LAB — BASIC METABOLIC PANEL
Anion gap: 8 (ref 5–15)
BUN: 15 mg/dL (ref 8–23)
CO2: 22 mmol/L (ref 22–32)
Calcium: 8.1 mg/dL — ABNORMAL LOW (ref 8.9–10.3)
Chloride: 108 mmol/L (ref 98–111)
Creatinine, Ser: 0.77 mg/dL (ref 0.61–1.24)
GFR, Estimated: >60 mL/min (ref >60)
Glucose, Bld: 98 mg/dL (ref 70–99)
Potassium: 3.5 mmol/L (ref 3.5–5.1)
Sodium: 138 mmol/L (ref 135–145)

## 2022-03-05 LAB — CBC
HCT: 34.7 % — ABNORMAL LOW (ref 39.0–52.0)
Hemoglobin: 11.8 g/dL — ABNORMAL LOW (ref 13.0–17.0)
MCH: 31.6 pg (ref 26.0–34.0)
MCHC: 34 g/dL (ref 30.0–36.0)
MCV: 92.8 fL (ref 80.0–100.0)
Platelets: 187 10*3/uL (ref 150–400)
RBC: 3.74 MIL/uL — ABNORMAL LOW (ref 4.22–5.81)
RDW: 12.8 % (ref 11.5–15.5)
WBC: 7.4 10*3/uL (ref 4.0–10.5)
nRBC: 0 % (ref 0.0–0.2)

## 2022-03-05 MED ORDER — IPRATROPIUM-ALBUTEROL 0.5-2.5 (3) MG/3ML IN SOLN: 3.0000 mL | RESPIRATORY_TRACT | Status: DC | PRN

## 2022-03-05 MED ORDER — ENSURE ENLIVE PO LIQD: 237.0000 mL | ORAL | Status: DC

## 2022-03-05 MED ORDER — BENZOCAINE 10 % MT GEL: Freq: Three times a day (TID) | OROMUCOSAL | Status: DC | PRN

## 2022-03-05 MED ORDER — SENNOSIDES-DOCUSATE SODIUM 8.6-50 MG PO TABS: 1.0000 | ORAL_TABLET | Freq: Every evening | ORAL | Status: DC | PRN

## 2022-03-05 MED ORDER — POTASSIUM CHLORIDE CRYS ER 20 MEQ PO TBCR
40.0000 meq | EXTENDED_RELEASE_TABLET | Freq: Once | ORAL | Status: AC
  Administered 2022-03-05: 40 meq via ORAL
  Filled 2022-03-05: qty 2

## 2022-03-05 MED ORDER — METOPROLOL TARTRATE 5 MG/5ML IV SOLN: 5.0000 mg | INTRAVENOUS | Status: DC | PRN

## 2022-03-05 MED ORDER — SODIUM CHLORIDE 0.9 % IV SOLN
510.0000 mg | Freq: Once | INTRAVENOUS | Status: AC
  Administered 2022-03-05: 510 mg via INTRAVENOUS
  Filled 2022-03-05: qty 17

## 2022-03-05 MED ORDER — GUAIFENESIN 100 MG/5ML PO LIQD: 5.0000 mL | ORAL | Status: DC | PRN

## 2022-03-05 MED ORDER — HYDRALAZINE HCL 20 MG/ML IJ SOLN: 10.0000 mg | INTRAMUSCULAR | Status: DC | PRN

## 2022-03-05 MED ORDER — DIVALPROEX SODIUM 125 MG PO CSDR
125.0000 mg | DELAYED_RELEASE_CAPSULE | Freq: Two times a day (BID) | ORAL | Status: DC
  Administered 2022-03-05 – 2022-03-07 (×3): 125 mg via ORAL
  Filled 2022-03-05 (×4): qty 1

## 2022-03-05 NOTE — Evaluation (Signed)
SLP Cancellation Note  Patient Details Name: Jeffrey [name] MRN: [id number] DOB: 23-Apr-1929   Cancelled treatment:       Reason Eval/Treat Not Completed: Other (comment) (pt getting cleaned by NT, will continue efforts) Kathleen Lime, MS [facility] SLP Acute Rehab Services Office [phone] Pager [phone]   Macario Golds 03/05/2022, 1:08 PM

## 2022-03-05 NOTE — Progress Notes (Signed)
Initial Nutrition Assessment  DOCUMENTATION CODES:   Non-severe (moderate) malnutrition in context of chronic illness  INTERVENTION:  -Continue regular diet -Trial Ensure Plus HP q day (350kcal, 20g protein) -Continue MVI  NUTRITION DIAGNOSIS:  Moderate Malnutrition related to chronic illness as evidenced by moderate fat depletion, moderate muscle depletion.  GOAL:  Patient will meet greater than or equal to 90% of their needs  MONITOR:  PO intake, Supplement acceptance  REASON FOR ASSESSMENT:  Consult Assessment of nutrition requirement/status  ASSESSMENT:  Pt is a 88yo M with PMH of dementia, diverticulosis, HLD, skin cancer, TIA, and BPH who present with subdural hematoma after multiple falls.  Visited pt at bedside this afternoon. He was pleasantly confused. Denies n/v and abdominal pain. Pt thinks he is eating well but is unable to recall. Documentation shows 60-75% meal consumption today. Reports food tastes normal. NFPE shows moderate fat loss and moderate muscle wasting. Pt meets ASPEN criteria for moderate protein calorie malnutrition r/t Chronic illness (dementia).  Recommend trial of Ensure Plus HP q day to provide 350kcal and 20g protein. Continue regular diet as tolerated. Offer feeding assistance/tray setup and encouragement at meals. Continue MVI.  Medications reviewed and include: colace, MVI, KCl, NS @ 33m/hr  Labs reviewed  NUTRITION - FOCUSED PHYSICAL EXAM:  Flowsheet Row Most Recent Value  Orbital Region Moderate depletion  Upper Arm Region Moderate depletion  Thoracic and Lumbar Region Mild depletion  Buccal Region Moderate depletion  Temple Region Moderate depletion  Clavicle Bone Region Moderate depletion  Clavicle and Acromion Bone Region Moderate depletion  Scapular Bone Region Unable to assess  Dorsal Hand Moderate depletion  Patellar Region Mild depletion  Anterior Thigh Region No depletion  Posterior Calf Region Mild depletion  Edema (RD  Assessment) None  Hair Reviewed  Eyes Reviewed  Mouth Reviewed  Skin Reviewed  Nails Reviewed       Diet Order:   Diet Order             Diet regular Room service appropriate? No; Fluid consistency: Thin  Diet effective now                   EDUCATION NEEDS:   Not appropriate for education at this time  Skin:  Skin Assessment: Skin Integrity Issues: Skin Integrity Issues:: Incisions Incisions: left hip and elbow  Last BM:  10/31  Height:  Ht Readings from Last 1 Encounters:  03/04/22 '5\' 7"'$  (1.702 m)   Weight:  Wt Readings from Last 1 Encounters:  03/04/22 66.6 kg    BMI:  Body mass index is 22.99 kg/m.  Estimated Nutritional Needs:  Kcal:  1665-2000kcal Protein:  80-100g Fluid:  1665-20019m KaCandise BowensMS, RD, LDN, CNSC See AMiON for contact information

## 2022-03-05 NOTE — Progress Notes (Addendum)
PROGRESS NOTE    NOELL SHULAR  DVV:[id number] DOB: 1929-02-07 DOA: 03/03/2022 PCP: Christiana Fuchs, DO   Brief Narrative:  88 year old male with dementia and BPH who presents after having mild multiple falls. CT of the head revealed small acute versus subacute subdural hematomas and possible subdural hygroma. CT of the abdomen and pelvis reveals a left hip hematoma.  Chest x-ray showed no acute mildly displaced fifth rib fracture, hypokalemia.  Previous provider discussed CT head with neurosurgery who recommended conservative management and no further acute intervention.  For left hip hematoma, currently monitoring hemoglobin.  Ongoing supportive care but due to his advanced age and chronic issues, palliative care was consulted to help establish goals of care.   Assessment & Plan:  Principal Problem:   Subdural hematoma (HCC) Active Problems:   Hypokalemia   Dementia (HCC)   Rib fracture   Anemia   Elevated lactic acid level   Thyroid nodule   Acute (very small) Left Subdural hematoma - superimposed on chronic right subdural hematomas with minimal mass effect (HCC) Frequent falls -Previous provider discussed case with neurosurgery at this time recommending conservative management.  Denies any headaches.  PT has recommended SNF   Left hip hematoma -Hemoglobin currently remained stable and patient is without any pain.  Hemoglobin today is 11.8     Elevated lactic acid level -Resolved   Dehydration -IVF     Hypokalemia -As needed repletion     Dementia (HCC) -Chronic issue supportive care     Rib fracture- acute vs chronic  -Currently supportive care.   Left thyroid nodule -Recommend outpatient work-up including CT and ultrasound     Anemia, normocytic -Anemia panel shows borderline low ferritin level.  We will go ahead and give IV iron here  Given recurrent falls, subdural hematoma, chronic issues and advanced age we will consult palliative care to help establish  goals of care. Patsy [place], is willing to coordinate with Palliative care team to go over MOST form.   PT/OT = SNF  DVT prophylaxis: SCDs Start: 03/04/22 1216 Code Status: Full Code Family Communication:  Called Legal Guardian [place].   Status is: Inpatient Remains inpatient appropriate because: Continue to monitor his frequent agitation.    Subjective: During my visit he is sitting up in the recliner, pleasantly confused only alert to name. No Complaints.    Examination:  General exam: Appears calm and comfortable  Respiratory system: Clear to auscultation. Respiratory effort normal. Cardiovascular system: S1 & S2 heard, RRR. No JVD, murmurs, rubs, gallops or clicks. No pedal edema. Gastrointestinal system: Abdomen is nondistended, soft and nontender. No organomegaly or masses felt. Normal bowel sounds heard. Central nervous system: Alert and oriented x name only. No focal neurological deficits. Extremities: Symmetric 4 x 5 power. Skin: No rashes, lesions or ulcers Psychiatry: Judgement and insight appear Poor   Objective: Vitals:   03/04/22 1610 03/04/22 2035 03/04/22 2228 03/05/22 0647  BP: 130/80 (!) 140/63  (!) 145/75  Pulse: 70 66  73  Resp: 20 14    Temp: 98 F (36.7 C) 99 F (37.2 C)  (!) 97.4 F (36.3 C)  TempSrc: Oral Oral  Axillary  SpO2: 98% 98%  100%  Weight:   66.6 kg   Height:   '5\' 7"'$  (1.702 m)     Intake/Output Summary (Last 24 hours) at 03/05/2022 0835 Last data filed at 03/05/2022 0300 Gross per 24 hour  Intake 771.64 ml  Output 250 ml  Net 521.64 ml  Filed Weights   03/04/22 2228  Weight: 66.6 kg     Data Reviewed:   CBC: Recent Labs  Lab 03/03/22 1331 03/03/22 1903 03/03/22 2039 03/04/22 1230 03/05/22 0404  WBC 8.5  --  7.2 7.7 7.4  NEUTROABS  --   --  5.0  --   --   HGB 10.8* 11.0* 11.6* 11.7* 11.8*  HCT 31.6* 32.6* 34.0* 34.4* 34.7*  MCV 93.5  --  93.7 93.0 92.8  PLT 184  --  191 197 937   Basic Metabolic  Panel: Recent Labs  Lab 03/03/22 1546 03/04/22 1230 03/05/22 0404  NA 137 140 138  K 3.1* 3.4* 3.5  CL 110 111 108  CO2 21* 21* 22  GLUCOSE 103* 98 98  BUN 25* 17 15  CREATININE 1.01 0.87 0.77  CALCIUM 8.0* 8.2* 8.1*  MG  --  2.2  --   PHOS  --  3.3  --    GFR: Estimated Creatinine Clearance: 56.2 mL/min (by C-G formula based on SCr of 0.77 mg/dL). Liver Function Tests: Recent Labs  Lab 03/04/22 1230  AST 26  ALT 19  ALKPHOS 52  BILITOT 1.5*  PROT 5.7*  ALBUMIN 3.3*   No results for input(s): "LIPASE", "AMYLASE" in the last 168 hours. No results for input(s): "AMMONIA" in the last 168 hours. Coagulation Profile: Recent Labs  Lab 03/03/22 2039  INR 1.1   Cardiac Enzymes: Recent Labs  Lab 03/04/22 1230  CKTOTAL 225   BNP (last 3 results) No results for input(s): "PROBNP" in the last 8760 hours. HbA1C: No results for input(s): "HGBA1C" in the last 72 hours. CBG: No results for input(s): "GLUCAP" in the last 168 hours. Lipid Profile: No results for input(s): "CHOL", "HDL", "LDLCALC", "TRIG", "CHOLHDL", "LDLDIRECT" in the last 72 hours. Thyroid Function Tests: Recent Labs    03/03/22 2039  TSH 2.008   Anemia Panel: Recent Labs    03/03/22 2039  VITAMINB12 911  FOLATE 25.5  FERRITIN 38  TIBC 302  IRON 39*  RETICCTPCT 2.7   Sepsis Labs: Recent Labs  Lab 03/03/22 2039 03/03/22 2254 03/04/22 0025 03/04/22 0303  LATICACIDVEN 3.5* 2.1* 1.2 1.0    No results found for this or any previous visit (from the past 240 hour(s)).       Radiology Studies: CT HEAD WO CONTRAST (5MM)  Result Date: 03/04/2022 CLINICAL DATA:  88 year old male with multiple falls. Small left subdural hematoma. Subsequent encounter. EXAM: CT HEAD WITHOUT CONTRAST TECHNIQUE: Contiguous axial images were obtained from the base of the skull through the vertex without intravenous contrast. RADIATION DOSE REDUCTION: This exam was performed according to the departmental  dose-optimization program which includes automated exposure control, adjustment of the mA and/or kV according to patient size and/or use of iterative reconstruction technique. COMPARISON:  Head CT 03/03/2022. FINDINGS: Brain: Increased conspicuity of bilateral dural vein displacement away from the inner table of the skull and associated low-density fluid in keeping with low-density bilateral subdural hematoma or hygroma. These are new since 2021. See series 4, image 39. The bilateral fluid collections are most apparent over the lateral and superior convexities and measure 5 mm at most levels bilaterally. Along the left lateral frontal convexity the left subdural collection is larger measuring 7-8 mm. And as on the CT yesterday, there does appear to be a small component of hyperdense left subdural blood along the left middle cranial fossa. See coronal image 26. Only mild intracranial mass effect with rightward midline shift of 3  mm (note cavum septum pellucidum). Basilar cisterns remain patent. Incidental midline pericallosal intracranial lipoma (normal variant). No superimposed ventriculomegaly, evidence of mass lesion, or evidence of cortically based acute infarction. Stable gray-white matter differentiation throughout the brain. Vascular: Calcified atherosclerosis at the skull base. Skull: Stable and intact. Sinuses/Orbits: Visualized paranasal sinuses and mastoids are clear. Other: No acute orbit or scalp soft tissue finding. IMPRESSION: 1. Positive for Bilateral Subdural Fluid Collections, new since 2021 but not significantly changed from yesterday: - mixed density left side subdural hematoma is 5-8 mm in thickness. - low-density right side subdural hematoma versus hygroma is 5 mm. 2. Only minimal intracranial mass effect. Rightward midline shift of 3 mm. Basilar cisterns remain patent. 3. No skull fracture or other acute intracranial abnormality. Electronically Signed   By: Genevie Ann M.D.   On: 03/04/2022 07:35    CT Head Wo Contrast  Addendum Date: 03/03/2022   ADDENDUM REPORT: 03/03/2022 18:35 ADDENDUM: The original report was by Dr. Van Clines. The following addendum is by Dr. Van Clines: Critical Value/emergent results were called by telephone at the time of interpretation on 03/03/2022 at 5:27 pm to provider Dr. Ronnald Nian, Who verbally acknowledged these results. Electronically Signed   By: Van Clines M.D.   On: 03/03/2022 18:35   Result Date: 03/03/2022 CLINICAL DATA:  Head trauma, multiple falls. Patient struck head today. Ongoing dizziness. EXAM: CT HEAD WITHOUT CONTRAST TECHNIQUE: Contiguous axial images were obtained from the base of the skull through the vertex without intravenous contrast. RADIATION DOSE REDUCTION: This exam was performed according to the departmental dose-optimization program which includes automated exposure control, adjustment of the mA and/or kV according to patient size and/or use of iterative reconstruction technique. COMPARISON:  06/09/2019 FINDINGS: Brain: Notable bilateral fluid density extra-axial fluid collections, probably with a component of subdural hygroma although there are some crossing vessels which raise the possibility of benign expansion of the subarachnoid space. Small acute or subacute subdural hematoma anterolaterally in the left middle cranial fossa on images 10 through 12 of series 3, up to 0.4 cm in thickness. This appearance was not present on 06/09/2019. 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 Otherwise, the brainstem, cerebellum, cerebral peduncles, thalamus, basal ganglia, basilar cisterns, and ventricular system appear within normal limits. No acute CVA identified. Vascular: There is atherosclerotic calcification of the cavernous carotid arteries bilaterally. Skull: Unremarkable Sinuses/Orbits: Unremarkable Other: Periapical lucency along a right maxillary premolar. IMPRESSION: 1. Small acute or subacute subdural hematoma  anterolaterally in the left middle cranial fossa, up to 0.4 cm in thickness. 2. Notable bilateral fluid density extra-axial fluid collections, probably with a component of subdural hygroma although there are some crossing vessels which raise the possibility of benign expansion of the subarachnoid space. 3. Stable left pericallosal lipoma. 4. Periapical lucency along a right maxillary premolar. 5. Atherosclerosis. Radiology assistant personnel have been notified to put me in telephone contact with the referring physician or the referring physician's clinical representative in order to discuss these findings. Once this communication is established I will issue an addendum to this report for documentation purposes. Electronically Signed: By: Van Clines M.D. On: 03/03/2022 17:21   CT ABDOMEN PELVIS W CONTRAST  Result Date: 03/03/2022 CLINICAL DATA:  Multiple falls, ongoing dizziness. Bruising along the body. EXAM: CT ABDOMEN AND PELVIS WITH CONTRAST TECHNIQUE: Multidetector CT imaging of the abdomen and pelvis was performed using the standard protocol following bolus administration of intravenous contrast. RADIATION DOSE REDUCTION: This exam was performed according to the departmental dose-optimization  program which includes automated exposure control, adjustment of the mA and/or kV according to patient size and/or use of iterative reconstruction technique. CONTRAST:  132m OMNIPAQUE IOHEXOL 300 MG/ML  SOLN COMPARISON:  03/05/2018 FINDINGS: Lower chest: Descending thoracic aortic atherosclerosis. Small to moderate-sized hiatal hernia. Right anterior descending coronary artery atherosclerotic vascular disease. Old healed right posterolateral rib fractures. Tiny pulmonary nodules in the 3-4 mm range in the lung bases are stable and considered benign. These do not warrant further follow up. Hepatobiliary: Motion artifact obscures portions of the liver. Those portions not obscured or skipped by motion artifact  appear unremarkable. The gallbladder appears grossly normal. Pancreas: Unremarkable Spleen: Motion artifacts cures the upper portion of the spleen. Otherwise unremarkable. Adrenals/Urinary Tract: Unremarkable Stomach/Bowel: Hiatal hernia is noted above. Vascular/Lymphatic: Atherosclerosis is present, including aortoiliac atherosclerotic disease. There is some atheromatous plaque proximally in the superior mesenteric artery without occlusion. No pathologic adenopathy. Reproductive: Prostatomegaly, prostate gland 6.0 by 4.9 by 6.7 cm (volume = 100 cm^3) with some heterogeneous enhancement in the peripheral zone especially near the apex, nonspecific although corroboration with PSA level suggested. Other: No supplemental non-categorized findings. Musculoskeletal: Substantial bruising and partially visualized hematoma in the subcutaneous tissues lateral to the left hip and proximal femur as shown for example on images 75-92 of series 4. This is primarily centered in the subcutaneous tissues and not directly along the superficial fascia margin to necessarily indicate a Morel Lavallee lesion. Subcutaneous edema posterior to the distal gluteus maximus muscle without visible underlying fracture of the left hip. Degenerative facet arthropathy in the lower lumbar spine. Possible left lateral recess disc protrusion at L3-4 extending caudad. IMPRESSION: 1. Substantial bruising and partially visualized hematoma in the subcutaneous tissues lateral to the left hip and proximal femur. This is primarily centered in the subcutaneous tissues and not directly along the superficial fascia margin to necessarily indicate a Morel Lavallee lesion. 2. Subcutaneous edema posterior to the distal gluteus maximus muscle without visible underlying fracture of the left hip. 3. Possible left lateral recess disc protrusion at L3-4 extending caudad. 4. Prostatomegaly, prostate gland volume 100 cc. Mildly heterogeneous peripheral zone enhancement  apically, correlate with PSA levels. 5. Small to moderate-sized hiatal hernia. 6. Old healed right posterolateral rib fractures. 7. Aortic atherosclerosis. Aortic Atherosclerosis (ICD10-I70.0). Electronically Signed   By: WVan ClinesM.D.   On: 03/03/2022 17:20   CT Cervical Spine Wo Contrast  Result Date: 03/03/2022 CLINICAL DATA:  Trauma EXAM: CT CERVICAL SPINE WITHOUT CONTRAST TECHNIQUE: Multidetector CT imaging of the cervical spine was performed without intravenous contrast. Multiplanar CT image reconstructions were also generated. RADIATION DOSE REDUCTION: This exam was performed according to the departmental dose-optimization program which includes automated exposure control, adjustment of the mA and/or kV according to patient size and/or use of iterative reconstruction technique. COMPARISON:  None Available. FINDINGS: Alignment: There is trace anterolisthesis at C7-T1 which is favored is degenerative. Alignment is otherwise anatomic. Skull base and vertebrae: The bones are osteopenic. No acute fracture. No primary bone lesion or focal pathologic process. Soft tissues and spinal canal: No prevertebral fluid or swelling. No visible canal hematoma. Disc levels: There is mild-to-moderate disc space narrowing throughout the cervical spine most significant at C3-C4, C5-C6 and C6-C7. There is no severe central canal or neural foraminal stenosis identified at any level. No significant central canal or neural foraminal stenosis at any level. Upper chest: Negative. Other: There is a 2.3 cm inferior left thyroid nodule. There are calcifications in the left thyroid gland. IMPRESSION: 1.  No acute fracture or traumatic subluxation of the cervical spine. 2. Mild-to-moderate degenerative changes. 3. 2.3 cm incidental left thyroid nodule. Recommend non-emergent thyroid ultrasound if clinically warranted given patient age. Reference: J Am Coll Radiol. 2015 Feb;12(2): 143-50 Electronically Signed   By: Ronney Asters M.D.   On: 03/03/2022 16:55   DG Elbow Complete Left  Result Date: 03/03/2022 CLINICAL DATA:  Fall.  Pain. EXAM: LEFT ELBOW - COMPLETE 3+ VIEW COMPARISON:  None Available. FINDINGS: There is diffuse decreased bone mineralization. Unfortunately IV catheter tubing overlies the distal anterior humeral fat pad on lateral view. Within this limitation, no definite elevation of the fat pad to indicate a joint effusion. No visualization of the posterior fat pad to indicate joint effusion. No acute fracture is seen. No dislocation. Joint spaces are preserved. IMPRESSION: No acute fracture is seen. Electronically Signed   By: Yvonne Kendall M.D.   On: 03/03/2022 15:12   DG Hip Unilat W or Wo Pelvis 2-3 Views Left  Result Date: 03/03/2022 CLINICAL DATA:  Multiple falls over the past few days.  Pain. EXAM: DG HIP (WITH OR WITHOUT PELVIS) 2-3V LEFT COMPARISON:  KUB 05/11/2019 FINDINGS: Mild to moderate pubic symphysis joint space narrowing, subchondral sclerosis, and peripheral osteophytosis. Mild bilateral superior femoroacetabular joint space narrowing. Unchanged small sclerotic focus within the left intertrochanteric region, likely a benign bone island. Similar likely small sclerotic bone island within the right femoral neck, unchanged from prior. No acute fracture or dislocation. IMPRESSION: 1. No acute fracture. 2. 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 Electronically Signed   By: Yvonne Kendall M.D.   On: 03/03/2022 15:11   DG Shoulder Left  Result Date: 03/03/2022 CLINICAL DATA:  Multiple falls over the past several days. EXAM: LEFT SHOULDER - 2+ VIEW COMPARISON:  None Available. FINDINGS: Mild inferior glenoid degenerative osteophytosis. Mild acromioclavicular joint space narrowing and peripheral osteophytosis. No acute fracture or dislocation. The visualized portion of the left lung is unremarkable. IMPRESSION: Mild acromioclavicular and glenohumeral osteoarthritis. No  acute fracture. Electronically Signed   By: Yvonne Kendall M.D.   On: 03/03/2022 15:09   DG Ribs Unilateral W/Chest Left  Result Date: 03/03/2022 CLINICAL DATA:  Multiple falls in past several days. EXAM: LEFT RIBS AND CHEST - 3+ VIEW COMPARISON:  Chest two views 07/25/2021 FINDINGS: Negative. Cardiac silhouette and mediastinal contours are within normal limits. Mild calcification within aortic arch. Unchanged mild biapical pleural thickening/scarring. The lungs are clear. No pleural effusion pneumothorax. Mild-to-moderate multilevel disc space narrowing and endplate osteophytes of the thoracic spine. Unchanged elevation of the right clavicular head with respect to the right acromion. Ectopic ossification is again seen along the right coracohumeral ligament, likely remote ligamentous tear. The left acromioclavicular joint space is appropriately aligned. There is an oblique linear lucency with minimal 1-2 mm cortical step-off indicating a minimally displaced fracture of the lateral left fifth rib. IMPRESSION: 1. Minimally displaced acute fracture of the lateral left fifth rib. 2. No pneumothorax. 3. 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 Electronically Signed   By: Yvonne Kendall M.D.   On: 03/03/2022 15:08        Scheduled Meds:  acetaminophen  650 mg Oral Q6H   citalopram  10 mg Oral Daily   docusate sodium  100 mg Oral BID   donepezil  10 mg Oral QHS   influenza vaccine adjuvanted  0.5 mL Intramuscular Tomorrow-1000   loratadine  10 mg Oral Daily   memantine  10 mg Oral BID   multivitamin with minerals  1 tablet Oral Daily   QUEtiapine  25 mg Oral q1800   traZODone  50 mg Oral QHS   Continuous Infusions:  sodium chloride 75 mL/hr at 03/05/22 0438     LOS: 1 day   Time spent= 35 mins    Tabb Croghan Arsenio Loader, MD [facility]  If 7PM-7AM, please contact  night-coverage  03/05/2022, 8:35 AM

## 2022-03-05 NOTE — TOC Initial Note (Signed)
Transition of Care [facility]) - Initial/Assessment Note    Patient Details  Name: Jeffrey [name] MRN: [id number] Date of Birth: [birth year]  Transition of Care [facility]) CM/SW Contact:    Jeffrey Kaufman, RN Phone Number: 03/05/2022, 10:26 AM  Clinical Narrative:    Received TOC consult for Admitted from any facility. Patient is from [facility] memory care unit. PT has recommended short SNF. This RNCM spoke with patient's legal guardian Jeffrey [name] with [facility], who advised patient was recently placed in [facility] as of 02/24/22.  Patient had multiple falls prior to being admitted to [facility]. The plan is for patient to return to [facility] once medically stable for discharge. Legal guardian Jeffrey [name] reports "patient must be transported by ambulance even if private pay, as patient will get away an attempt to return home."   Spoke with Jeffrey [name] with [name]F/ memory care unit (previously [facility]) to confirm patient can return, will not need FL2, will need discharge summary with discharge medications.    TOC will continue to follow.                Expected Discharge Plan: Memory Care Barriers to Discharge: Continued Medical Work up   Patient Goals and CMS Choice Patient states their goals for this hospitalization and ongoing recovery are:: Return to [facility] CMS Medicare.gov Compare Post Acute Care list provided to:: Patient Represenative (must comment) (Legal Guardian- Jeffrey [name]) Choice offered to / list presented to : [name] POA / Guardian Jeffrey [name]- legal guardian)  Expected Discharge Plan and Services Expected Discharge Plan: Memory Care In-house Referral: NA Discharge Planning Services: CM Consult Post Acute Care Choice: Resumption of Svcs/PTA Provider Living arrangements for the past 2 months: Assisted Living Facility                 DME Arranged: N/A DME Agency: NA       HH Arranged: NA HH Agency:  NA        Prior Living Arrangements/Services Living arrangements for the past 2 months: [place] Lives with:: Facility Resident Patient language and need for interpreter reviewed:: Yes Do you feel safe going back to the place where you live?: Yes      Need for Family Participation in Patient Care: No (Comment) Care giver support system in place?: Yes (comment) Current home services: Other (comment) (ALF- memory care unit) Criminal Activity/Legal Involvement Pertinent to Current Situation/Hospitalization: No - Comment as needed  Activities of Daily Living Home Assistive Devices/Equipment: Eyeglasses ADL Screening (condition at time of admission) Patient's cognitive ability adequate to safely complete daily activities?: No Is the patient deaf or have difficulty hearing?: No Does the patient have difficulty seeing, even when wearing glasses/contacts?: No Does the patient have difficulty concentrating, remembering, or making decisions?: Yes Patient able to express need for assistance with ADLs?: Yes Does the patient have difficulty dressing or bathing?: Yes (stand by assist) Independently performs ADLs?: No Communication: Independent Dressing (OT): Needs assistance Is this a change from baseline?: Pre-admission baseline Grooming: Independent Feeding: Independent Bathing: Needs assistance Is this a change from baseline?: Pre-admission baseline Toileting: Needs assistance Is this a change from baseline?: Pre-admission baseline In/Out Bed: Needs assistance Is this a change from baseline?: Pre-admission baseline Walks in Home: Needs assistance Is this a change from baseline?: Pre-admission baseline Does the patient have difficulty walking or climbing stairs?: Yes Weakness of Legs: None Weakness of Arms/Hands: None  Permission Sought/Granted Permission sought to share  information with : Case Manager, Guardian Permission granted to share information with : Yes,  Verbal Permission Granted  Share Information with NAME: Case Manager           Emotional Assessment Appearance:: Appears stated age Attitude/Demeanor/Rapport: Unable to Assess Affect (typically observed): Unable to Assess Orientation: : Oriented to Self Alcohol / Substance Use: Not Applicable Psych Involvement: No (comment)  Admission diagnosis:  Subdural hematoma ([place]) [S06.5XAA] SDH (subdural hematoma) ([place]) [S06.5XAA] Closed fracture of one rib of left side, initial encounter [S22.32XA] Hematoma of left thigh, initial encounter [S70.12XA] Patient Active Problem List   Diagnosis Date Noted   Thyroid nodule 03/04/2022   Subdural hematoma ([place]) 03/03/2022   Rib fracture 03/03/2022   Anemia 03/03/2022   Elevated lactic acid level 03/03/2022   Healthcare maintenance 11/27/2021   Goals of care, counseling/discussion 11/12/2021   Dementia ([place]) 06/20/2020   Hypokalemia 05/11/2020   Chronic constipation 09/22/2019   Death of wife 2019/02/06   Cerumen impaction 03/14/2018   Esophageal stenosis 01/09/2017   Left ankle pain 11/05/2015   Allergic rhinitis 12/23/2012   History of melanoma 09/22/2011   Dysfunction of eustachian tube 05/01/2011   Schamberg's purpura 08/01/2010   ELEVATED BLOOD PRESSURE WITHOUT DIAGNOSIS OF HYPERTENSION 10/03/2008   TRANSIENT ISCHEMIC ATTACK 08/24/2007   PCP:  Christiana Fuchs, DO Pharmacy:   [facility], [address]. [place]. [place] [address] Phone: [phone] Fax: [phone]     Social Determinants of Health (SDOH) Interventions    Readmission Risk Interventions     No data to display

## 2022-03-05 NOTE — Progress Notes (Signed)
NT and RN tried to take vitals and pt started kicking. Unable to get closed to pt and take vitals. Attending on-call aware and received order of ankle restraints and restraints applied.

## 2022-03-05 NOTE — Evaluation (Signed)
Occupational Therapy Evaluation Patient Details Name: Jeffrey [name] MRN: [id number] DOB: October 27, 1928 Today's Date: 03/05/2022   History of Present Illness [name] is a 88 yr old male admitted to the hospital with dizziness and after having multiple falls over the past few days. He was found to have a SDH, L hip hematoma, and L 5th rib fracture. PMH: dementia, TIA, BPH   Clinical Impression   Patient presented with unsteadiness in standing, decreased safety awareness, decreased insight into deficits, reports of dizziness with activity, and decreased attention to tasks; he needed intermittent redirection to tasks. He required min assist for supine to sit, min assist for lower body dressing, and min guard assist to stand. He ambulated a short distance to the bedside chair, requiring steadying/balance assist. He is currently at high risk for falls. He will benefit from further OT services to maximize his safety and independence with self-care tasks.       Recommendations for follow up therapy are one component of a multi-disciplinary discharge planning process, led by the attending physician.  Recommendations may be updated based on patient status, additional functional criteria and insurance authorization.   Follow Up Recommendations  Skilled nursing-short term rehab (<3 hours/day)    Assistance Recommended at Discharge Frequent or constant Supervision/Assistance  Patient can return home with the following A little help with walking and/or transfers;A little help with bathing/dressing/bathroom;Direct supervision/assist for financial management;Direct supervision/assist for medications management    Functional Status Assessment  Patient has had a recent decline in their functional status and demonstrates the ability to make significant improvements in function in a reasonable and predictable amount of time.  Equipment Recommendations  None recommended by OT       Precautions /  Restrictions Precautions Precautions: Fall Restrictions Weight Bearing Restrictions: No      Mobility Bed Mobility Overal bed mobility: Needs Assistance Bed Mobility: Supine to Sit     Supine to sit: Min assist          Transfers Overall transfer level: Needs assistance Equipment used: Rolling walker (2 wheels) Transfers: Sit to/from Stand Sit to Stand: Min guard             Balance Overall balance assessment: Needs assistance, History of Falls   Sitting balance-Leahy Scale: Good       Standing balance-Leahy Scale: Poor           ADL either performed or assessed with clinical judgement   ADL Overall ADL's : Needs assistance/impaired Eating/Feeding: Sitting;Set up Eating/Feeding Details (indicate cue type and reason): He drank water from a cup seated in the bedside chair Grooming: Set up;Sitting Grooming Details (indicate cue type and reason): He performed face washing and teeth brushing seated in the chair, requiring min verbal cues for thoroughness with tasks and sustained attention.         Upper Body Dressing : Set up;Sitting   Lower Body Dressing: Minimal assistance               Vision Baseline Vision/History: 1 Wears glasses              Pertinent Vitals/Pain Pain Assessment Pain Assessment: No/denies pain     Hand Dominance Right   Extremity/Trunk Assessment Upper Extremity Assessment Upper Extremity Assessment: Overall WFL for tasks assessed   Lower Extremity Assessment Lower Extremity Assessment: Overall WFL for tasks assessed       Communication Communication Communication: No difficulties   Cognition Arousal/Alertness: Awake/alert   Overall Cognitive Status: History of  cognitive impairments - at baseline Area of Impairment: Attention, Memory, Safety/judgement                 Orientation Level: Disoriented to, Time, Situation   Memory: Decreased short-term memory, Decreased recall of precautions    Safety/Judgement: Decreased awareness of deficits, Decreased awareness of safety     General Comments: Pt is tangential and requires redirection. He followed 1 step commands with occasional repetition                Home Living Family/patient expects to be discharged to:: Assisted living            Additional Comments: pt is from memory care - [facility]      Prior Functioning/Environment Prior Level of Function : Independent/Modified Independent               ADLs Comments: Pt reported being independent with ADLs & ambulation, however this may need to be verified as he is a questionable historian.        OT Problem List: Decreased strength;Impaired balance (sitting and/or standing);Decreased cognition;Decreased safety awareness;Decreased knowledge of use of DME or AE;Decreased knowledge of precautions      OT Treatment/Interventions: Self-care/ADL training;Therapeutic exercise;Energy conservation;DME and/or AE instruction;Patient/family education;Balance training;Therapeutic activities;Cognitive remediation/compensation    OT Goals(Current goals can be found in the care plan section) Acute Rehab OT Goals OT Goal Formulation: With patient Time For Goal Achievement: 03/19/22 Potential to Achieve Goals: Good ADL Goals Pt Will Perform Grooming: with supervision;standing Pt Will Perform Lower Body Dressing: with supervision;sit to/from stand Pt Will Transfer to Toilet: with supervision;ambulating Pt Will Perform Toileting - Clothing Manipulation and hygiene: with supervision;sit to/from stand  OT Frequency: Min 2X/week       AM-PAC OT "6 Clicks" Daily Activity     Outcome Measure Help from another person eating meals?: None Help from another person taking care of personal grooming?: A Little Help from another person toileting, which includes using toliet, bedpan, or urinal?: A Little Help from another person bathing (including washing, rinsing, drying)?: A  Lot Help from another person to put on and taking off regular upper body clothing?: A Little Help from another person to put on and taking off regular lower body clothing?: A Little 6 Click Score: 18   End of Session Equipment Utilized During Treatment: Gait belt;Rolling walker (2 wheels) Nurse Communication: Mobility status  Activity Tolerance: Patient tolerated treatment well Patient left: in chair;with call bell/phone within reach;with nursing/sitter in room  OT Visit Diagnosis: Unsteadiness on feet (R26.81);Repeated falls (R29.6)                Time: 1005-1030 OT Time Calculation (min): 25 min Charges:  OT General Charges $OT Visit: 1 Visit OT Evaluation $OT Eval Low Complexity: 1 Low OT Treatments $Self Care/Home Management : 8-22 mins    Leota Sauers, OTR/L 03/05/2022, 11:44 AM

## 2022-03-05 NOTE — Evaluation (Addendum)
Clinical/Bedside Swallow Evaluation Patient Details  Name: Jeffrey [name] MRN: [id number] Date of Birth: Dec 02, 1928  Today's Date: 03/05/2022 Time: SLP Start Time (ACUTE ONLY): 1515 SLP Stop Time (ACUTE ONLY): 1557 SLP Time Calculation (min) (ACUTE ONLY): 42 min  Past Medical History:  Past Medical History:  Diagnosis Date   Adenomatous polyp of colon 1995    w/ HGD   Advance care planning 12/29/2013   Advance directive- Sister [place] designated patient were incapacitated.   Allergic rhinitis    Bilateral cold feet 03/07/2020   BPH (benign prostatic hyperplasia)    COLONIC POLYPS, HX OF 08/24/2006   Qualifier: Diagnosis of  By: Council Mechanic MD, Hilaria Ota    Diverticulosis 09/21/2000   DIVERTICULOSIS, COLON 09/21/2000   Qualifier: Diagnosis of  By: Council Mechanic MD, Hilaria Ota    Dizziness 03/07/2020   ED (erectile dysfunction)    ED (erectile dysfunction) 11/14/2010   EXTERNAL HEMORRHOIDS 05/02/2010   Qualifier: Diagnosis of  By: Damita Dunnings MD, Phillip Heal     Hyperlipemia 05/05/1988   HYPERTROPHY PROSTATE W/UR OBST & OTH LUTS 10/09/2009   Pt uninterested in further testing.    Hypokalemia 05/11/2020   Hyponatremia 05/11/2020   Infected sebaceous cyst 01/25/2020   Knee pain 08/13/2011   Lung nodule 02/03/2019   Medicare annual wellness visit, subsequent 12/22/2011   Memory change 01/03/2016   Memory loss    Nasal sinus congestion 03/08/2019   Paresthesia of foot 08/13/2011   Rectal bleeding 09/26/2019   Schamberg's purpura    2012- eval by Dr. Allyson Sabal   Sebaceous cyst    Shoulder pain, right 08/24/2016   Skin cancer 05/05/1998   hx of Scalp-melanoma   TIA (transient ischemic attack)    Past Surgical History:  Past Surgical History:  Procedure Laterality Date   Carotid U/S Nml  09/08/2007   Colon polyp  2011   B9 Int Hemms   Colonoscopy polyps  10/05/2003   Divertics in hemms   COLONOSCOPY W/ POLYPECTOMY  08/1993; 09/21/2000;10/05/2003   B9   Echo (other)  09/08/2007   nml lvf EF 50-55% Mild  Diast Dysfctn Triv Ar   melanoma scalp  07/2003   SEPTOPLASTY  1995   Skin revision vertex of scalp  2009   Dr. Luetta Nutting, [facility]   TONSILLECTOMY     HPI:  Pt is a 88 year old male with dementia, TIA, memory loss, covid, BPH who presents after having mild multiple falls.CT of the head revealed small acute versus subacute subdural hematomas and possible subdural hygroma.  CT of the abdomen and pelvis reveals a left hip hematoma. Chest x-ray minimally displaced acute fracture of the fifth rib.  Swallow eval ordered. Pt did not have Yale 3 ounce water screen.    Assessment / Plan / Recommendation  Clinical Impression  Functional oropharyngeal swallow ability noted without indication of dysphagia nor aspiration with po consumed.  He was able to self feed water, jello and graham crackers.  Swallow judged to be timely wihout indication of retention and he easily passed 3 ounce Yale swallow screen.  He does endorse discomfort and "dry" lips - stating sometimes it "spreads across his entire lip".  Please see the picture in the media portion of the chart.  SLP advised he avoid acidiic, spicy and hot temperature foods.  Recommend continue diet as tolerated.  No SLP follow up needed. SLP Visit Diagnosis: Dysphagia, unspecified (R13.10)    Aspiration Risk  Mild aspiration risk    Diet Recommendation Regular;Thin liquid  Liquid Administration via: Cup Supervision: Patient able to self feed Compensations: Slow rate;Small sips/bites Postural Changes: Seated upright at 90 degrees;Remain upright for at least 30 minutes after po intake    Other  Recommendations Oral Care Recommendations: Oral care BID    Recommendations for follow up therapy are one component of a multi-disciplinary discharge planning process, led by the attending physician.  Recommendations may be updated based on patient status, additional functional criteria and insurance authorization.  Follow up Recommendations No SLP follow up       Assistance Recommended at Discharge None  Functional Status Assessment Patient has not had a recent decline in their functional status  Frequency and Duration      N/a      Prognosis    N/a    Swallow Study   General Date of Onset: 03/05/22 HPI: Pt is a 88 year old male with dementia, TIA, memory loss, covid, BPH who presents after having mild multiple falls.CT of the head revealed small acute versus subacute subdural hematomas and possible subdural hygroma.  CT of the abdomen and pelvis reveals a left hip hematoma. Chest x-ray minimally displaced acute fracture of the fifth rib.  Swallow eval ordered. Pt did not have Yale 3 ounce water screen. Type of Study: Bedside Swallow Evaluation Previous Swallow Assessment: not in chart Diet Prior to this Study: Regular;Thin liquids Temperature Spikes Noted: No Respiratory Status: Room air History of Recent Intubation: No Behavior/Cognition: Alert;Cooperative;Pleasant mood Oral Cavity Assessment: Within Functional Limits Oral Care Completed by SLP: No Oral Cavity - Dentition: Other (Comment);Adequate natural dentition;Missing dentition (missing some posterior dentition) Self-Feeding Abilities: Able to feed self Patient Positioning: Upright in bed Baseline Vocal Quality: Low vocal intensity Volitional Cough: Strong Volitional Swallow: Able to elicit    Oral/Motor/Sensory Function Overall Oral Motor/Sensory Function: Within functional limits   Ice Chips Ice chips: Not tested   Thin Liquid Thin Liquid: Within functional limits Presentation: Self Fed;Cup    Nectar Thick Nectar Thick Liquid: Not tested   Honey Thick Honey Thick Liquid: Not tested   Puree Puree: Within functional limits Presentation: Self Fed;Spoon   Solid     Solid: Within functional limits Presentation: Self Fredirick Lathe 03/05/2022,4:26 PM  Kathleen Lime, MS [facility] SLP [place] Office [phone] Pager [phone]

## 2022-03-06 ENCOUNTER — Encounter (HOSPITAL_COMMUNITY): Payer: Self-pay | Admitting: Internal Medicine

## 2022-03-06 DIAGNOSIS — Z66 Do not resuscitate: Secondary | ICD-10-CM | POA: Diagnosis not present

## 2022-03-06 DIAGNOSIS — S065XAA Traumatic subdural hemorrhage with loss of consciousness status unknown, initial encounter: Secondary | ICD-10-CM | POA: Diagnosis not present

## 2022-03-06 LAB — CBC
HCT: 32.2 % — ABNORMAL LOW (ref 39.0–52.0)
Hemoglobin: 11.4 g/dL — ABNORMAL LOW (ref 13.0–17.0)
MCH: 32.2 pg (ref 26.0–34.0)
MCHC: 35.4 g/dL (ref 30.0–36.0)
MCV: 91 fL (ref 80.0–100.0)
Platelets: 181 10*3/uL (ref 150–400)
RBC: 3.54 MIL/uL — ABNORMAL LOW (ref 4.22–5.81)
RDW: 13.1 % (ref 11.5–15.5)
WBC: 7.8 10*3/uL (ref 4.0–10.5)
nRBC: 0 % (ref 0.0–0.2)

## 2022-03-06 LAB — URINALYSIS, ROUTINE W REFLEX MICROSCOPIC
Bilirubin Urine: NEGATIVE
Glucose, UA: NEGATIVE mg/dL
Ketones, ur: NEGATIVE mg/dL
Leukocytes,Ua: NEGATIVE
Nitrite: NEGATIVE
Protein, ur: NEGATIVE mg/dL
RBC / HPF: >50 RBC/hpf — ABNORMAL HIGH (ref 0–5)
Specific Gravity, Urine: 1.009 (ref 1.005–1.030)
pH: 5 (ref 5.0–8.0)

## 2022-03-06 LAB — BASIC METABOLIC PANEL
Anion gap: 9 (ref 5–15)
BUN: 15 mg/dL (ref 8–23)
CO2: 18 mmol/L — ABNORMAL LOW (ref 22–32)
Calcium: 8 mg/dL — ABNORMAL LOW (ref 8.9–10.3)
Chloride: 106 mmol/L (ref 98–111)
Creatinine, Ser: 0.86 mg/dL (ref 0.61–1.24)
GFR, Estimated: >60 mL/min (ref >60)
Glucose, Bld: 131 mg/dL — ABNORMAL HIGH (ref 70–99)
Potassium: 3.6 mmol/L (ref 3.5–5.1)
Sodium: 133 mmol/L — ABNORMAL LOW (ref 135–145)

## 2022-03-06 LAB — MAGNESIUM: Magnesium: 2.2 mg/dL (ref 1.7–2.4)

## 2022-03-06 MED ORDER — OXYCODONE HCL 5 MG PO TABS: 5.0000 mg | ORAL_TABLET | ORAL | 0 refills | Status: DC | PRN

## 2022-03-06 MED ORDER — DIVALPROEX SODIUM 125 MG PO CSDR: 125.0000 mg | DELAYED_RELEASE_CAPSULE | Freq: Two times a day (BID) | ORAL | 0 refills | Status: DC

## 2022-03-06 MED ORDER — FINASTERIDE 5 MG PO TABS
5.0000 mg | ORAL_TABLET | Freq: Every day | ORAL | Status: DC
  Administered 2022-03-06 – 2022-03-07 (×2): 5 mg via ORAL
  Filled 2022-03-06 (×2): qty 1

## 2022-03-06 MED ORDER — OXYCODONE HCL 5 MG PO TABS
5.0000 mg | ORAL_TABLET | ORAL | Status: DC | PRN
  Administered 2022-03-06: 5 mg via ORAL
  Filled 2022-03-06: qty 1

## 2022-03-06 MED ORDER — LIDOCAINE 5 % EX PTCH
1.0000 | MEDICATED_PATCH | CUTANEOUS | Status: DC
  Administered 2022-03-06 – 2022-03-07 (×2): 1 via TRANSDERMAL
  Filled 2022-03-06 (×2): qty 1

## 2022-03-06 MED ORDER — LIDOCAINE 5 % EX PTCH: 1.0000 | MEDICATED_PATCH | CUTANEOUS | 0 refills | Status: DC

## 2022-03-06 MED ORDER — QUETIAPINE FUMARATE 25 MG PO TABS: 25.0000 mg | ORAL_TABLET | Freq: Every day | ORAL | 0 refills | Status: AC

## 2022-03-06 MED ORDER — DOXAZOSIN MESYLATE 2 MG PO TABS: 2.0000 mg | ORAL_TABLET | Freq: Every day | ORAL | Status: DC

## 2022-03-06 MED ORDER — LORAZEPAM 0.5 MG PO TABS: 0.5000 mg | ORAL_TABLET | Freq: Four times a day (QID) | ORAL | 0 refills | Status: AC | PRN

## 2022-03-06 NOTE — Progress Notes (Signed)
Occupational Therapy Treatment Patient Details Name: Jeffrey [name] MRN: [id number] DOB: [birth year] Today's Date: 03/06/2022   History of present illness Jeffrey [name] is a 88 yr old male admitted to the hospital with dizziness and after having multiple falls over the past few days. He was found to have a SDH, L hip hematoma, and L 5th rib fracture. PMH: dementia, TIA, BPH   OT comments  Pt making very slow progress toward goals.  Pt very resistant to therapy today bc of pain and fear of falling when up.  Pt complained specifically if "kidney pain."  Therapist walked pt to bathroom due to need to urinate.  Pt unable to urinate despite being on commode for 15 minutes and attempted in standing for another 5 minutes.  Pt with large bruising on back in area of kidney from falls.  Question why this pt is unable to urinate?  D/c back to memory care is being discussed.  MDs: do we need to check into if this pt is having kidney issues? Pt does have dementia but does have valid c/o pain with bruising and inability to urinate.  Feel a SNF d/c for this pt is not the best option as feel he is unable to learn new things due to his cognition. Spoke to his guardian who stated he has been a flight risk and needs a locked/memory unit.  If pt is d/c'd to his memory unit, it needs to be explored where this pt has taken his falls.  Home?  Vs. When he was on this memory unit for a week.  This also may or may not be a safe d/c for him if he took several falls there prior to coming to hospital.  Pt may need a sitter/24 hour direct supervision if he is to be d/c'd soon, but feel pt should be able to urinate before thinking about d/c. Will continue to follow as pt is agreeable to attempt to address fall prevention.    Recommendations for follow up therapy are one component of a multi-disciplinary discharge planning process, led by the attending physician.  Recommendations may be updated based on patient status, additional  functional criteria and insurance authorization.    Follow Up Recommendations  Other (comment) (return to memory care with a sitter.)    Assistance Recommended at Discharge Frequent or constant Supervision/Assistance  Patient can return home with the following  A little help with walking and/or transfers;A little help with bathing/dressing/bathroom;Assistance with cooking/housework;Direct supervision/assist for medications management;Direct supervision/assist for financial management;Assist for transportation;Help with stairs or ramp for entrance   Equipment Recommendations  None recommended by OT    Recommendations for Other Services      Precautions / Restrictions Precautions Precautions: Fall Precaution Comments: previous falls pta. Not sure if those falls were sustained at home or at the memory unit he came from that he lived at for one week. Restrictions Weight Bearing Restrictions: No Other Position/Activity Restrictions: dizzy at times upon standing.       Mobility Bed Mobility Overal bed mobility: Needs Assistance Bed Mobility: Supine to Sit     Supine to sit: Min assist     General bed mobility comments: for safety    Transfers Overall transfer level: Needs assistance Equipment used: Rolling walker (2 wheels) Transfers: Sit to/from Stand Sit to Stand: Min guard           General transfer comment: pt stands without assist, wide BOS, extremely unsteady     Balance Overall balance assessment:  Needs assistance, History of Falls Sitting-balance support: Feet supported, No upper extremity supported, Feet unsupported Sitting balance-Leahy Scale: Good     Standing balance support: During functional activity Standing balance-Leahy Scale: Poor Standing balance comment: reliant on external assist                           ADL either performed or assessed with clinical judgement   ADL Overall ADL's : Needs assistance/impaired Eating/Feeding:  Sitting;Set up   Grooming: Wash/dry hands;Wash/dry face;Minimal assistance;Standing Grooming Details (indicate cue type and reason): pt stood at [employer] Transfer: Minimal assistance;Ambulation;Rolling walker (2 wheels);Comfort height toilet;Grab bars Toilet Transfer Details (indicate cue type and reason): pt walked to bathroom.  Pt is unsteady on his feet and requires cues for safety. Toileting- Clothing Manipulation and Hygiene: Minimal assistance;Sit to/from stand;Cueing for compensatory techniques;Cueing for safety       Functional mobility during ADLs: Minimal assistance;Rolling walker (2 wheels) General ADL Comments: Pt limited due to poor cognition and poor balance.    Extremity/Trunk Assessment Upper Extremity Assessment Upper Extremity Assessment: Overall WFL for tasks assessed   Lower Extremity Assessment Lower Extremity Assessment: Defer to PT evaluation RLE Coordination: decreased gross motor LLE Coordination: decreased gross motor        Vision   Vision Assessment?: No apparent visual deficits   Perception Perception Perception: Within Functional Limits   Praxis      Cognition Arousal/Alertness: Awake/alert Behavior During Therapy: Agitated Overall Cognitive Status: History of cognitive impairments - at baseline Area of Impairment: Orientation, Attention, Memory, Safety/judgement, Awareness, Problem solving                 Orientation Level: Disoriented to, Time, Situation Current Attention Level: Focused Memory: Decreased short-term memory, Decreased recall of precautions Following Commands: Follows one step commands inconsistently, Follows multi-step commands inconsistently Safety/Judgement: Decreased awareness of deficits, Decreased awareness of safety Awareness: Intellectual Problem Solving: Slow processing, Decreased initiation General Comments: Pt is at baseline with his cognition which fluctuates. At times, pt is  agreeable and cooperative and has some awareness of deficits, at other times he has no insight into current situation.        Exercises      Shoulder Instructions       General Comments Pt is most limited by his cognition. Pt is a fall risk and has little to no insight into his deficits which makes him a safety risk anywhere he is discharged.    Pertinent Vitals/ Pain       Pain Assessment Pain Assessment: Faces Faces Pain Scale: Hurts even more Pain Location: L hip area. Very bruised Pain Descriptors / Indicators: Dull, Grimacing, Guarding, Sore Pain Intervention(s): Limited activity within patient's tolerance, Monitored during session, Repositioned, Premedicated before session  Home Living                                          Prior Functioning/Environment              Frequency  Min 2X/week        Progress Toward Goals  OT Goals(current goals can now be found in the care plan section)  Progress towards OT goals: Progressing toward goals  Acute Rehab OT Goals OT Goal Formulation: With patient Time  For Goal Achievement: 03/19/22 Potential to Achieve Goals: Good ADL Goals Pt Will Perform Grooming: with supervision;standing Pt Will Perform Lower Body Dressing: with supervision;sit to/from stand Pt Will Transfer to Toilet: with supervision;ambulating Pt Will Perform Toileting - Clothing Manipulation and hygiene: with supervision;sit to/from stand  Plan Discharge plan needs to be updated    Co-evaluation                 AM-PAC OT "6 Clicks" Daily Activity     Outcome Measure   Help from another person eating meals?: None Help from another person taking care of personal grooming?: A Little Help from another person toileting, which includes using toliet, bedpan, or urinal?: A Little Help from another person bathing (including washing, rinsing, drying)?: A Lot Help from another person to put on and taking off regular upper body  clothing?: A Little Help from another person to put on and taking off regular lower body clothing?: A Little 6 Click Score: 18    End of Session Equipment Utilized During Treatment: Gait belt;Rolling walker (2 wheels)  OT Visit Diagnosis: Unsteadiness on feet (R26.81);Repeated falls (R29.6)   Activity Tolerance Patient tolerated treatment well   Patient Left in bed;with bed alarm set;with call bell/phone within reach   Nurse Communication Mobility status        Time: 7034-0352 OT Time Calculation (min): 41 min  Charges: OT General Charges $OT Visit: 1 Visit OT Treatments $Self Care/Home Management : 38-52 mins   Glenford Peers 03/06/2022, 12:04 PM

## 2022-03-06 NOTE — TOC Progression Note (Addendum)
Transition of Care [facility]) - Progression Note    Patient Details  Name: Jeffrey [name] MRN: [id number] Date of Birth: 1929/03/11  Transition of Care [facility]) CM/SW [place], RN Phone Number: 03/06/2022, 11:10 AM  Clinical Narrative:  Left voicemail message for [name] at [facility] regarding patient returning to ALF today, awaiting a call back.   This RNCM will send FL2 to ALF, awaiting discharge summary.  TOC wil continue to follow   - 11:40am Spoke with [name] at [facility] to advise of PT short term SNF recommendation, however legal guardian wants patietn to return to ALF. [name] reports [facility] is [place] agency that ALF normally uses. This RNCM notified MD of request for HHPT/OT orders.  Spoke with patient's legal guardian [name] with [facility]. Offered choice for outpatient palliative services, [name] chose [facility] Notified Shanita with [facility] of outpatient palliative referral. [facility] will follow.  TOC wil continue to follow  Expected Discharge Plan: Memory Care Barriers to Discharge: No Barriers Identified  Expected Discharge Plan and Services Expected Discharge Plan: Memory Care In-house Referral: NA Discharge Planning Services: CM Consult Post Acute Care Choice: Resumption of Svcs/PTA Provider Living arrangements for the past 2 months: Assisted Living Facility                 DME Arranged: N/A DME Agency: NA       HH Arranged: NA HH Agency: NA         Social Determinants of Health (SDOH) Interventions    Readmission Risk Interventions     No data to display

## 2022-03-06 NOTE — Progress Notes (Signed)
PROGRESS NOTE  Jeffrey [name] TDD:[id number] DOB: April 16, 1929 DOA: 03/03/2022 PCP: Christiana Fuchs, DO   LOS: 2 days   Brief Narrative / Interim history: 88 year old male with dementia and BPH who presents after having mild multiple falls. CT of the head revealed small acute versus subacute subdural hematomas and possible subdural hygroma. CT of the abdomen and pelvis reveals a left hip hematoma.  Chest x-ray showed no acute mildly displaced fifth rib fracture, hypokalemia.  Previous provider discussed CT head with neurosurgery who recommended conservative management and no further acute intervention.  For left hip hematoma, currently monitoring hemoglobin.  Ongoing supportive care but due to his advanced age and chronic issues, palliative care was consulted to help establish goals of care.   Subjective / 24h Interval events: Complains of lower back pain.  Cannot tell me for how long that is been going on.  Assesement and Plan: Principal Problem:   Subdural hematoma (HCC) Active Problems:   Hypokalemia   Dementia (HCC)   Rib fracture   Anemia   Elevated lactic acid level   Thyroid nodule   Malnutrition of moderate degree   DNR (do not resuscitate)   Principal problem Acute (very small) Left Subdural hematoma - superimposed on chronic right subdural hematomas with minimal mass effect, Frequent falls -Previous provider discussed case with neurosurgery at this time recommending conservative management.  Denies any headaches.  PT has recommended SNF   Active problems Left hip hematoma -Hemoglobin currently remained stable and patient is without any pain.    Elevated lactic acid level-Resolved  Acute urinary obstruction-probably has a degree of BPH, as well as increased pain from recent falls, pain medication usage as well as immobility in bed can all contribute.  Place a Foley, can discharge with and give a voiding trial as an outpatient when more mobile.  He is allergic to Flomax,  placed on finasteride  Dehydration -improving with IVF   Hypokalemia -As needed repletion, monitor in am    Dementia-Chronic issue supportive care   Rib fracture- acute vs chronic -Currently supportive care, pain control, incentive spirometry   Left thyroid nodule-Recommend outpatient work-up including CT and ultrasound   Anemia, normocytic -Anemia panel shows borderline low ferritin level.  Received IV iron  Scheduled Meds:  acetaminophen  650 mg Oral Q6H   citalopram  10 mg Oral Daily   divalproex  125 mg Oral Q12H   docusate sodium  100 mg Oral BID   donepezil  10 mg Oral QHS   feeding supplement  237 mL Oral Q24H   finasteride  5 mg Oral Daily   lidocaine  1 patch Transdermal Q24H   memantine  10 mg Oral BID   multivitamin with minerals  1 tablet Oral Daily   QUEtiapine  25 mg Oral q1800   Continuous Infusions:  sodium chloride 75 mL/hr at 03/06/22 1014   PRN Meds:.acetaminophen **OR** acetaminophen, benzocaine, haloperidol lactate, hydrALAZINE, ipratropium-albuterol, lip balm, LORazepam, metoprolol tartrate, oxyCODONE, senna-docusate  Current Outpatient Medications  Medication Instructions   acetaminophen (TYLENOL) 650 mg, Oral, Every 6 hours PRN   carbamide peroxide (DEBROX) 6.5 % OTIC solution 5 drops, Both EARS, 2 times daily   citalopram (CELEXA) 10 mg, Oral, Daily   divalproex (DEPAKOTE SPRINKLE) 125 mg, Oral, Every 12 hours   docusate sodium (COLACE) 100 mg, Oral, Every 12 hours   donepezil (ARICEPT) 10 mg, Oral, Daily at bedtime   fluticasone (FLONASE) 50 MCG/ACT nasal spray 1 spray, Each Nare, Daily PRN   [START  ON 03/07/2022] lidocaine (LIDODERM) 5 % 1 patch, Transdermal, Every 24 hours, Remove & Discard patch within 12 hours or as directed by MD   loratadine (CLARITIN) 10 MG tablet Oral, Daily   LORazepam (ATIVAN) 0.5 mg, Oral, Every 6 hours PRN   memantine (NAMENDA) 10 mg, Oral, 2 times daily   Multiple Vitamin (MULTIVITAMIN) tablet 1 tablet, Oral, Daily,  Senior multivitamin.   oxyCODONE (OXY IR/ROXICODONE) 5 mg, Oral, Every 4 hours PRN   polyethylene glycol (MIRALAX / GLYCOLAX) 17 g, Oral, Daily   QUEtiapine (SEROQUEL) 25 mg, Oral, Daily at bedtime   traZODone (DESYREL) 50 mg, Oral, Daily at bedtime    Diet Orders (From admission, onward)     Start     Ordered   03/05/22 0754  Diet regular Room service appropriate? No; Fluid consistency: Thin  Diet effective now       Question Answer Comment  Room service appropriate? No   Fluid consistency: Thin      03/05/22 0754            DVT prophylaxis: SCDs Start: 03/04/22 1216   Lab Results  Component Value Date   PLT 181 03/06/2022      Code Status: DNR  Family Communication: no family at bedside  Status is: Inpatient, acute urinary obstruction  Level of care: Telemetry  Consultants:  Palliative   Objective: Vitals:   03/05/22 1300 03/05/22 2129 03/06/22 0454 03/06/22 0810  BP: (!) 131/55 (!) 150/83 (!) 150/69 (!) 169/77  Pulse: 85 74 81 79  Resp: '18 20 20 19  '$ Temp: 97.8 F (36.6 C) 97.9 F (36.6 C) 98 F (36.7 C) 97.9 F (36.6 C)  TempSrc: Oral Oral Oral Oral  SpO2:  96% 95% 96%  Weight:      Height:        Intake/Output Summary (Last 24 hours) at 03/06/2022 1542 Last data filed at 03/06/2022 0600 Gross per 24 hour  Intake 1995.27 ml  Output 701 ml  Net 1294.27 ml   Wt Readings from Last 3 Encounters:  03/04/22 66.6 kg  11/26/21 66.5 kg  11/13/21 64.4 kg    Examination:  Constitutional: NAD Eyes: no scleral icterus ENMT: Mucous membranes are moist.  Neck: normal, supple Respiratory: clear to auscultation bilaterally, no wheezing, no crackles. Normal respiratory effort. No accessory muscle use.  Cardiovascular: Regular rate and rhythm, no murmurs / rubs / gallops.  No edema Abdomen: non distended, no tenderness. Bowel sounds positive.  Musculoskeletal: no clubbing / cyanosis.   Data Reviewed: I have independently reviewed following labs and  imaging studies   CBC Recent Labs  Lab 03/03/22 1331 03/03/22 1903 03/03/22 2039 03/04/22 1230 03/05/22 0404 03/06/22 0412  WBC 8.5  --  7.2 7.7 7.4 7.8  HGB 10.8* 11.0* 11.6* 11.7* 11.8* 11.4*  HCT 31.6* 32.6* 34.0* 34.4* 34.7* 32.2*  PLT 184  --  191 197 187 181  MCV 93.5  --  93.7 93.0 92.8 91.0  MCH 32.0  --  32.0 31.6 31.6 32.2  MCHC 34.2  --  34.1 34.0 34.0 35.4  RDW 13.2  --  13.0 13.0 12.8 13.1  LYMPHSABS  --   --  1.2  --   --   --   MONOABS  --   --  0.9  --   --   --   EOSABS  --   --  0.0  --   --   --   BASOSABS  --   --  0.0  --   --   --     Recent Labs  Lab 03/03/22 1546 03/03/22 2039 03/03/22 2254 03/04/22 0025 03/04/22 0303 03/04/22 1230 03/05/22 0404 03/06/22 0412  NA 137  --   --   --   --  140 138 133*  K 3.1*  --   --   --   --  3.4* 3.5 3.6  CL 110  --   --   --   --  111 108 106  CO2 21*  --   --   --   --  21* 22 18*  GLUCOSE 103*  --   --   --   --  98 98 131*  BUN 25*  --   --   --   --  '17 15 15  '$ CREATININE 1.01  --   --   --   --  0.87 0.77 0.86  CALCIUM 8.0*  --   --   --   --  8.2* 8.1* 8.0*  AST  --   --   --   --   --  26  --   --   ALT  --   --   --   --   --  19  --   --   ALKPHOS  --   --   --   --   --  52  --   --   BILITOT  --   --   --   --   --  1.5*  --   --   ALBUMIN  --   --   --   --   --  3.3*  --   --   MG  --   --   --   --   --  2.2  --  2.2  LATICACIDVEN  --  3.5* 2.1* 1.2 1.0  --   --   --   INR  --  1.1  --   --   --   --   --   --   TSH  --  2.008  --   --   --   --   --   --     ------------------------------------------------------------------------------------------------------------------ No results for input(s): "CHOL", "HDL", "LDLCALC", "TRIG", "CHOLHDL", "LDLDIRECT" in the last 72 hours.  No results found for: "HGBA1C" ------------------------------------------------------------------------------------------------------------------ Recent Labs    03/03/22 2039  TSH 2.008    Cardiac Enzymes No  results for input(s): "CKMB", "TROPONINI", "MYOGLOBIN" in the last 168 hours.  Invalid input(s): "CK" ------------------------------------------------------------------------------------------------------------------ No results found for: "BNP"  CBG: No results for input(s): "GLUCAP" in the last 168 hours.  No results found for this or any previous visit (from the past 240 hour(s)).   Radiology Studies: No results found.   [name], MD, PhD [facility]  Between 7 am - 7 pm I am available, please contact me via Amion (for emergencies) or Securechat (non urgent messages)  Between 7 pm - 7 am I am not available, please contact night coverage MD/APP via Amion

## 2022-03-06 NOTE — Progress Notes (Signed)
Daily Progress Note   Patient Name: Jeffrey [name]       Date: 03/06/2022 DOB: 03/30/1929  Age: 88 y.o. MRN#: [id number] Attending Physician: Caren Griffins, MD Primary Care Physician: Christiana Fuchs, DO Admit Date: 03/03/2022  Reason for Consultation/Follow-up: Establishing goals of care  Patient Profile/HPI: 88 y.o. male  with past medical history of dementia, previous TIA, hyperlipidemia, chronic constipation, and a remote history of melanoma admitted from [facility] on 03/03/2022 with dizziness and multiple falls over the past few days. Patient reports having hit his head with fall that occurred on 10/30. Patient has multiple bruises in various stages to his left side - temple, shoulder, elbow, chest, LLQ, and hip. Not on blood thinners at time of admission.  Review of EMR and received updates from bedside nursing staff prior to seeing patient. Nursing staff report that patient had a restful night.  Presented to bedside for visit. Patient observed resting peacefully with eyes closed and easy respirations. He is allowed to rest at this time. Call placed to [facility] to update him on patient's status and to follow-up on previous discussion regarding code status. Jeffrey [name] consents to DNR/DNI on behalf of patient. Jeffrey [name] plans to visit later today and expresses appreciation for the care that hospital staff have provided to patient. He is in agreement with patient's return to [facility] place when patient deemed medically ready.   Physical Exam Constitutional:      Appearance: He is ill-appearing.     Comments: Resting with eyes closed and peaceful appearance at time of visit.  Pulmonary:     Effort: Pulmonary effort is normal.  Genitourinary:    Comments:  Foley in place draining adequate urine.           Vital Signs: BP (!) 169/77 (BP Location: Right Arm) Comment: BP was rechecked 3 times. Patient is experiencing severe back pain. RN notifying doctor about more pain medications. Pt currently only has acetaminophen.  Pulse 79   Temp 97.9 F (36.6 C) (Oral)   Resp 19   Ht '5\' 7"'$  (1.702 m)   Wt 66.6 kg   SpO2 96%   BMI 22.99 kg/m  SpO2: SpO2: 96 % O2 Device: O2 Device: Room Air O2 Flow Rate:    Intake/output summary:  Intake/Output Summary (Last 24 hours)  at 03/06/2022 0859 Last data filed at 03/06/2022 0600 Gross per 24 hour  Intake 2285.27 ml  Output 701 ml  Net 1584.27 ml   LBM: Last BM Date : 03/04/22 Baseline Weight: Weight: 66.6 kg Most recent weight: Weight: 66.6 kg    Patient Active Problem List   Diagnosis Date Noted   Malnutrition of moderate degree 03/05/2022   Thyroid nodule 03/04/2022   Subdural hematoma ([place]) 03/03/2022   Rib fracture 03/03/2022   Anemia 03/03/2022   Elevated lactic acid level 03/03/2022   Healthcare maintenance 11/27/2021   Goals of care, counseling/discussion 11/12/2021   Dementia ([place]) 06/20/2020   Hypokalemia 05/11/2020   Chronic constipation 10-19-19   Death of wife 03-05-19   Cerumen impaction 03/14/2018   Esophageal stenosis 01/09/2017   Left ankle pain 11/05/2015   Allergic rhinitis 12/23/2012   History of melanoma 09/22/2011   Dysfunction of eustachian tube 05/01/2011   Schamberg's purpura 08/01/2010   ELEVATED BLOOD PRESSURE WITHOUT DIAGNOSIS OF HYPERTENSION 10/03/2008   TRANSIENT ISCHEMIC ATTACK 08/24/2007    Palliative Care Assessment & Plan   88 y.o. male  with past medical history of dementia, previous TIA, hyperlipidemia, chronic constipation, and a remote history of melanoma admitted from [facility] on 03/03/2022 with dizziness and multiple falls over the past few days. Patient reports having hit his head with fall that occurred on 10/30. Patient has multiple  bruises in various stages to his left side - temple, shoulder, elbow, chest, LLQ, and hip. Not on blood thinners at time of admission.   Assessment/Recommendations/Plan  Return to [facility] memory care unit. Continue outpatient palliative support with Manufacturing engineer. PMT signing off. Please contact with any further needs.   Code Status: DNR  Prognosis:  Months to years.  Discharge Planning: Return to [facility] memory care unit.  Care plan was discussed with [place].  Thank you for allowing the Palliative Medicine Team to assist in the care of this patient.   Moss Mc, RN MSN [facility] / Student NP Palliative Medicine   Please contact Palliative Medicine Team phone at [phone] for questions and concerns.

## 2022-03-07 DIAGNOSIS — S065XAA Traumatic subdural hemorrhage with loss of consciousness status unknown, initial encounter: Secondary | ICD-10-CM | POA: Diagnosis not present

## 2022-03-07 LAB — BASIC METABOLIC PANEL
Anion gap: 6 (ref 5–15)
BUN: 18 mg/dL (ref 8–23)
CO2: 20 mmol/L — ABNORMAL LOW (ref 22–32)
Calcium: 7.5 mg/dL — ABNORMAL LOW (ref 8.9–10.3)
Chloride: 110 mmol/L (ref 98–111)
Creatinine, Ser: 0.96 mg/dL (ref 0.61–1.24)
GFR, Estimated: >60 mL/min (ref >60)
Glucose, Bld: 100 mg/dL — ABNORMAL HIGH (ref 70–99)
Potassium: 3.4 mmol/L — ABNORMAL LOW (ref 3.5–5.1)
Sodium: 136 mmol/L (ref 135–145)

## 2022-03-07 LAB — CBC
HCT: 28.4 % — ABNORMAL LOW (ref 39.0–52.0)
Hemoglobin: 10 g/dL — ABNORMAL LOW (ref 13.0–17.0)
MCH: 32.4 pg (ref 26.0–34.0)
MCHC: 35.2 g/dL (ref 30.0–36.0)
MCV: 91.9 fL (ref 80.0–100.0)
Platelets: 176 10*3/uL (ref 150–400)
RBC: 3.09 MIL/uL — ABNORMAL LOW (ref 4.22–5.81)
RDW: 13.4 % (ref 11.5–15.5)
WBC: 9.4 10*3/uL (ref 4.0–10.5)
nRBC: 0 % (ref 0.0–0.2)

## 2022-03-07 LAB — MAGNESIUM: Magnesium: 2.2 mg/dL (ref 1.7–2.4)

## 2022-03-07 MED ORDER — POTASSIUM CHLORIDE CRYS ER 20 MEQ PO TBCR
40.0000 meq | EXTENDED_RELEASE_TABLET | Freq: Once | ORAL | Status: AC
  Administered 2022-03-07: 40 meq via ORAL
  Filled 2022-03-07: qty 2

## 2022-03-07 MED ORDER — FERROUS GLUCONATE 324 (38 FE) MG PO TABS: 324.0000 mg | ORAL_TABLET | Freq: Every day | ORAL | 3 refills | Status: AC

## 2022-03-07 MED ORDER — FINASTERIDE 5 MG PO TABS: 5.0000 mg | ORAL_TABLET | Freq: Every day | ORAL | Status: AC

## 2022-03-07 NOTE — TOC Progression Note (Addendum)
Transition of Care [facility]) - Progression Note    Patient Details  Name: BRANNDON TUITE MRN: [id number] Date of Birth: 1928-08-05  Transition of [facility]) CM/SW Contact  Leeroy Cha, RN Phone Number: 03/07/2022, 9:37 AM  Clinical Narrative:    Tct-[facility]-no answer at main number will try again.  1000-tct-to facility-spoke with Janett Billow rn wcb asap. 1134-tct-facility-no answer st main phone number. 1200-TCT-facility-no answer,  1214-tct-[facility]-message left on v.m. to return call. 1219-tcf-[facility] okay for patient to return. Expected Discharge Plan: Memory Care Barriers to Discharge: No Barriers Identified  Expected Discharge Plan and Services Expected Discharge Plan: Memory Care In-house Referral: NA Discharge Planning Services: CM Consult Post Acute Care Choice: Resumption of Svcs/PTA Provider Living arrangements for the past 2 months: [place] Expected Discharge Date: 03/07/22               DME Arranged: N/A DME Agency: NA       HH Arranged: NA HH Agency: NA         Social Determinants of Health (SDOH) Interventions    Readmission Risk Interventions   No data to display

## 2022-03-07 NOTE — Discharge Summary (Signed)
Physician Discharge Summary  Jeffrey [name] GYI:[id number] DOB: 1929/01/06 DOA: 03/03/2022  PCP: Christiana Fuchs, DO  Admit date: 03/03/2022 Discharge date: 03/07/2022  Admitted From: ALF Disposition:  ALF  Recommendations for Outpatient Follow-up:  Follow up with PCP in 1-2 weeks Once more mobile and pain better control, please do a voiding trial and dc Foley  Home Health: PT Equipment/Devices: none  Discharge Condition: stable CODE STATUS: DNR Diet Orders (From admission, onward)     Start     Ordered   03/05/22 0754  Diet regular Room service appropriate? No; Fluid consistency: Thin  Diet effective now       Question Answer Comment  Room service appropriate? No   Fluid consistency: Thin      03/05/22 0754            HPI: Per admitting MD, Jeffrey [name] is a 88 y.o. male with medical history significant of dementia, BPH, HLD, hx of TIA, Presented with fall, Patient transferred from [facility] with multiple falls over past few days not on blood thinners but has hit his head today has been having ongoing dizziness that causes his falls.  He has he has known history of dementia With multiple bruises all over his body no nausea no vomiting but he does think that he is dehydrated.  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  Denies any headaches.  This appears to have remained stable, no focal deficits, and otherwise he is back to baseline   Active  problems Left hip hematoma -pain controlled, hemoglobin overall stable.  Elevated lactic acid level-Resolved Acute urinary obstruction-probably has a degree of BPH, as well as increased pain from recent falls, pain medication usage as well as immobility in bed can all contribute.  Place a Foley, can discharge with and give a voiding trial as an outpatient when more mobile.  He is allergic to Flomax, placed on finasteride Dehydration -improved Hypokalemia -potassium repleted prior to discharge Dementia-Chronic issue supportive care Rib fracture- acute vs chronic -Currently supportive care, pain control, incentive spirometry Left thyroid nodule-Recommend outpatient work-up including CT and ultrasound Anemia, normocytic -Anemia panel shows borderline low ferritin level.  Received IV iron.  Continue iron supplements on discharge  Sepsis ruled out   Discharge Instructions   Allergies as of 03/07/2022       Reactions   Flomax [tamsulosin] Other (See Comments)   Flushing  Arm stiffness   Flonase [fluticasone Propionate] Other (See Comments)   Epistaxis        Medication List     STOP taking these medications    docusate sodium 100 MG capsule Commonly known as: COLACE   traZODone 50 MG tablet Commonly known as: DESYREL       TAKE these medications    acetaminophen 325 MG tablet Commonly known as: TYLENOL Take 650 mg by mouth every 6 (six) hours as needed for headache, fever or mild pain.   citalopram 10 MG tablet Commonly known as: CELEXA Take 1 tablet (  10 mg total) by mouth daily. What changed: when to take this   Debrox 6.5 % OTIC solution Generic drug: carbamide peroxide Place 5 drops into both ears 2 (two) times daily. What changed:  when to take this additional instructions   divalproex 125 MG capsule Commonly known as: DEPAKOTE SPRINKLE Take 1 capsule (125 mg total) by mouth every 12 (twelve) hours.   donepezil 10 MG tablet Commonly known as:  ARICEPT Take 1 tablet (10 mg total) by mouth at bedtime.   finasteride 5 MG tablet Commonly known as: PROSCAR Take 1 tablet (5 mg total) by mouth daily. Start taking on: March 08, 2022   fluticasone 50 MCG/ACT nasal spray Commonly known as: FLONASE Place 1 spray into both nostrils daily as needed for allergies.   lidocaine 5 % Commonly known as: LIDODERM Place 1 patch onto the skin daily. Remove & Discard patch within 12 hours or as directed by MD   loratadine 10 MG tablet Commonly known as: CLARITIN Take 1 tablet by mouth once daily What changed:  how much to take when to take this reasons to take this   LORazepam 0.5 MG tablet Commonly known as: ATIVAN Take 1 tablet (0.5 mg total) by mouth every 6 (six) hours as needed (agitation, restlessness).   memantine 10 MG tablet Commonly known as: NAMENDA Take 1 tablet (10 mg total) by mouth 2 (two) times daily.   multivitamin tablet Take 1 tablet by mouth daily. Senior multivitamin.   oxyCODONE 5 MG immediate release tablet Commonly known as: Oxy IR/ROXICODONE Take 1 tablet (5 mg total) by mouth every 4 (four) hours as needed for severe pain or breakthrough pain.   polyethylene glycol 17 g packet Commonly known as: MIRALAX / GLYCOLAX Take 17 g by mouth daily.   QUEtiapine 25 MG tablet Commonly known as: SEROQUEL Take 1 tablet (25 mg total) by mouth at bedtime.         Consultations: palliative  Procedures/Studies:   CT HEAD WO CONTRAST (5MM)  Result Date: 03/04/2022 CLINICAL DATA:  88 year old male with multiple falls. Small left subdural hematoma. Subsequent encounter. EXAM: CT HEAD WITHOUT CONTRAST TECHNIQUE: Contiguous axial images were obtained from the base of the skull through the vertex without intravenous contrast. RADIATION DOSE REDUCTION: This exam was performed according to the departmental dose-optimization program which includes automated exposure control, adjustment of the mA and/or kV according  to patient size and/or use of iterative reconstruction technique. COMPARISON:  Head CT 03/03/2022. FINDINGS: Brain: Increased conspicuity of bilateral dural vein displacement away from the inner table of the skull and associated low-density fluid in keeping with low-density bilateral subdural hematoma or hygroma. These are new since 2021. See series 4, image 39. The bilateral fluid collections are most apparent over the lateral and superior convexities and measure 5 mm at most levels bilaterally. Along the left lateral frontal convexity the left subdural collection is larger measuring 7-8 mm. And as on the CT yesterday, there does appear to be a small component of hyperdense left subdural blood along the left middle cranial fossa. See coronal image 26. Only mild intracranial mass effect with rightward midline shift of 3 mm (note cavum septum pellucidum). Basilar cisterns remain patent. Incidental midline pericallosal intracranial lipoma (normal variant). No superimposed ventriculomegaly, evidence of mass lesion, or evidence of cortically based acute infarction. Stable gray-white matter differentiation throughout the brain. Vascular: Calcified atherosclerosis at the skull base. Skull: Stable and intact. Sinuses/Orbits: Visualized paranasal sinuses and mastoids are clear. Other: No acute  orbit or scalp soft tissue finding. IMPRESSION: 1. Positive for Bilateral Subdural Fluid Collections, new since 2021 but not significantly changed from yesterday: - mixed density left side subdural hematoma is 5-8 mm in thickness. - low-density right side subdural hematoma versus hygroma is 5 mm. 2. Only minimal intracranial mass effect. Rightward midline shift of 3 mm. Basilar cisterns remain patent. 3. No skull fracture or other acute intracranial abnormality. Electronically Signed   By: Genevie Ann M.D.   On: 03/04/2022 07:35   CT Head Wo Contrast  Addendum Date: 03/03/2022   ADDENDUM REPORT: 03/03/2022 18:35 ADDENDUM: The  original report was by Dr. Van Clines. The following addendum is by Dr. Van Clines: Critical Value/emergent results were called by telephone at the time of interpretation on 03/03/2022 at 5:27 pm to provider Dr. Ronnald Nian, Who verbally acknowledged these results. Electronically Signed   By: Van Clines M.D.   On: 03/03/2022 18:35   Result Date: 03/03/2022 CLINICAL DATA:  Head trauma, multiple falls. Patient struck head today. Ongoing dizziness. EXAM: CT HEAD WITHOUT CONTRAST TECHNIQUE: Contiguous axial images were obtained from the base of the skull through the vertex without intravenous contrast. RADIATION DOSE REDUCTION: This exam was performed according to the departmental dose-optimization program which includes automated exposure control, adjustment of the mA and/or kV according to patient size and/or use of iterative reconstruction technique. COMPARISON:  06/09/2019 FINDINGS: Brain: Notable bilateral fluid density extra-axial fluid collections, probably with a component of subdural hygroma although there are some crossing vessels which raise the possibility of benign expansion of the subarachnoid space. Small acute or subacute subdural hematoma anterolaterally in the left middle cranial fossa on images 10 through 12 of series 3, up to 0.4 cm in thickness. This appearance was not present on 06/09/2019. 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 Otherwise, the brainstem, cerebellum, cerebral peduncles, thalamus, basal ganglia, basilar cisterns, and ventricular system appear within normal limits. No acute CVA identified. Vascular: There is atherosclerotic calcification of the cavernous carotid arteries bilaterally. Skull: Unremarkable Sinuses/Orbits: Unremarkable Other: Periapical lucency along a right maxillary premolar. IMPRESSION: 1. Small acute or subacute subdural hematoma anterolaterally in the left middle cranial fossa, up to 0.4 cm in thickness. 2. Notable bilateral fluid density  extra-axial fluid collections, probably with a component of subdural hygroma although there are some crossing vessels which raise the possibility of benign expansion of the subarachnoid space. 3. Stable left pericallosal lipoma. 4. Periapical lucency along a right maxillary premolar. 5. Atherosclerosis. Radiology assistant personnel have been notified to put me in telephone contact with the referring physician or the referring physician's clinical representative in order to discuss these findings. Once this communication is established I will issue an addendum to this report for documentation purposes. Electronically Signed: By: Van Clines M.D. On: 03/03/2022 17:21   CT ABDOMEN PELVIS W CONTRAST  Result Date: 03/03/2022 CLINICAL DATA:  Multiple falls, ongoing dizziness. Bruising along the body. EXAM: CT ABDOMEN AND PELVIS WITH CONTRAST TECHNIQUE: Multidetector CT imaging of the abdomen and pelvis was performed using the standard protocol following bolus administration of intravenous contrast. RADIATION DOSE REDUCTION: This exam was performed according to the departmental dose-optimization program which includes automated exposure control, adjustment of the mA and/or kV according to patient size and/or use of iterative reconstruction technique. CONTRAST:  151m OMNIPAQUE IOHEXOL 300 MG/ML  SOLN COMPARISON:  03/05/2018 FINDINGS: Lower chest: Descending thoracic aortic atherosclerosis. Small to moderate-sized hiatal hernia. Right anterior descending coronary artery atherosclerotic vascular disease. Old healed right posterolateral rib  fractures. Tiny pulmonary nodules in the 3-4 mm range in the lung bases are stable and considered benign. These do not warrant further follow up. Hepatobiliary: Motion artifact obscures portions of the liver. Those portions not obscured or skipped by motion artifact appear unremarkable. The gallbladder appears grossly normal. Pancreas: Unremarkable Spleen: Motion artifacts  cures the upper portion of the spleen. Otherwise unremarkable. Adrenals/Urinary Tract: Unremarkable Stomach/Bowel: Hiatal hernia is noted above. Vascular/Lymphatic: Atherosclerosis is present, including aortoiliac atherosclerotic disease. There is some atheromatous plaque proximally in the superior mesenteric artery without occlusion. No pathologic adenopathy. Reproductive: Prostatomegaly, prostate gland 6.0 by 4.9 by 6.7 cm (volume = 100 cm^3) with some heterogeneous enhancement in the peripheral zone especially near the apex, nonspecific although corroboration with PSA level suggested. Other: No supplemental non-categorized findings. Musculoskeletal: Substantial bruising and partially visualized hematoma in the subcutaneous tissues lateral to the left hip and proximal femur as shown for example on images 75-92 of series 4. This is primarily centered in the subcutaneous tissues and not directly along the superficial fascia margin to necessarily indicate a Morel Lavallee lesion. Subcutaneous edema posterior to the distal gluteus maximus muscle without visible underlying fracture of the left hip. Degenerative facet arthropathy in the lower lumbar spine. Possible left lateral recess disc protrusion at L3-4 extending caudad. IMPRESSION: 1. Substantial bruising and partially visualized hematoma in the subcutaneous tissues lateral to the left hip and proximal femur. This is primarily centered in the subcutaneous tissues and not directly along the superficial fascia margin to necessarily indicate a Morel Lavallee lesion. 2. Subcutaneous edema posterior to the distal gluteus maximus muscle without visible underlying fracture of the left hip. 3. Possible left lateral recess disc protrusion at L3-4 extending caudad. 4. Prostatomegaly, prostate gland volume 100 cc. Mildly heterogeneous peripheral zone enhancement apically, correlate with PSA levels. 5. Small to moderate-sized hiatal hernia. 6. Old healed right posterolateral  rib fractures. 7. Aortic atherosclerosis. Aortic Atherosclerosis (ICD10-I70.0). Electronically Signed   By: Van Clines M.D.   On: 03/03/2022 17:20   CT Cervical Spine Wo Contrast  Result Date: 03/03/2022 CLINICAL DATA:  Trauma EXAM: CT CERVICAL SPINE WITHOUT CONTRAST TECHNIQUE: Multidetector CT imaging of the cervical spine was performed without intravenous contrast. Multiplanar CT image reconstructions were also generated. RADIATION DOSE REDUCTION: This exam was performed according to the departmental dose-optimization program which includes automated exposure control, adjustment of the mA and/or kV according to patient size and/or use of iterative reconstruction technique. COMPARISON:  None Available. FINDINGS: Alignment: There is trace anterolisthesis at C7-T1 which is favored is degenerative. Alignment is otherwise anatomic. Skull base and vertebrae: The bones are osteopenic. No acute fracture. No primary bone lesion or focal pathologic process. Soft tissues and spinal canal: No prevertebral fluid or swelling. No visible canal hematoma. Disc levels: There is mild-to-moderate disc space narrowing throughout the cervical spine most significant at C3-C4, C5-C6 and C6-C7. There is no severe central canal or neural foraminal stenosis identified at any level. No significant central canal or neural foraminal stenosis at any level. Upper chest: Negative. Other: There is a 2.3 cm inferior left thyroid nodule. There are calcifications in the left thyroid gland. IMPRESSION: 1. No acute fracture or traumatic subluxation of the cervical spine. 2. Mild-to-moderate degenerative changes. 3. 2.3 cm incidental left thyroid nodule. Recommend non-emergent thyroid ultrasound if clinically warranted given patient age. Reference: J Am Coll Radiol. 2015 Feb;12(2): 143-50 Electronically Signed   By: Ronney Asters M.D.   On: 03/03/2022 16:55   DG Elbow Complete Left  Result Date: 03/03/2022 CLINICAL DATA:  Fall.  Pain.  EXAM: LEFT ELBOW - COMPLETE 3+ VIEW COMPARISON:  None Available. FINDINGS: There is diffuse decreased bone mineralization. Unfortunately IV catheter tubing overlies the distal anterior humeral fat pad on lateral view. Within this limitation, no definite elevation of the fat pad to indicate a joint effusion. No visualization of the posterior fat pad to indicate joint effusion. No acute fracture is seen. No dislocation. Joint spaces are preserved. IMPRESSION: No acute fracture is seen. Electronically Signed   By: Yvonne Kendall M.D.   On: 03/03/2022 15:12   DG Hip Unilat W or Wo Pelvis 2-3 Views Left  Result Date: 03/03/2022 CLINICAL DATA:  Multiple falls over the past few days.  Pain. EXAM: DG HIP (WITH OR WITHOUT PELVIS) 2-3V LEFT COMPARISON:  KUB 05/11/2019 FINDINGS: Mild to moderate pubic symphysis joint space narrowing, subchondral sclerosis, and peripheral osteophytosis. Mild bilateral superior femoroacetabular joint space narrowing. Unchanged small sclerotic focus within the left intertrochanteric region, likely a benign bone island. Similar likely small sclerotic bone island within the right femoral neck, unchanged from prior. No acute fracture or dislocation. IMPRESSION: 1. No acute fracture. 2. 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 Electronically Signed   By: Yvonne Kendall M.D.   On: 03/03/2022 15:11   DG Shoulder Left  Result Date: 03/03/2022 CLINICAL DATA:  Multiple falls over the past several days. EXAM: LEFT SHOULDER - 2+ VIEW COMPARISON:  None Available. FINDINGS: Mild inferior glenoid degenerative osteophytosis. Mild acromioclavicular joint space narrowing and peripheral osteophytosis. No acute fracture or dislocation. The visualized portion of the left lung is unremarkable. IMPRESSION: Mild acromioclavicular and glenohumeral osteoarthritis. No acute fracture. Electronically Signed   By: Yvonne Kendall M.D.   On: 03/03/2022 15:09   DG Ribs Unilateral W/Chest  Left  Result Date: 03/03/2022 CLINICAL DATA:  Multiple falls in past several days. EXAM: LEFT RIBS AND CHEST - 3+ VIEW COMPARISON:  Chest two views 07/25/2021 FINDINGS: Negative. Cardiac silhouette and mediastinal contours are within normal limits. Mild calcification within aortic arch. Unchanged mild biapical pleural thickening/scarring. The lungs are clear. No pleural effusion pneumothorax. Mild-to-moderate multilevel disc space narrowing and endplate osteophytes of the thoracic spine. Unchanged elevation of the right clavicular head with respect to the right acromion. Ectopic ossification is again seen along the right coracohumeral ligament, likely remote ligamentous tear. The left acromioclavicular joint space is appropriately aligned. There is an oblique linear lucency with minimal 1-2 mm cortical step-off indicating a minimally displaced fracture of the lateral left fifth rib. IMPRESSION: 1. Minimally displaced acute fracture of the lateral left fifth rib. 2. No pneumothorax. 3. 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 Electronically Signed   By: Yvonne Kendall M.D.   On: 03/03/2022 15:08     Subjective: - no chest pain, shortness of breath, no abdominal pain, nausea or vomiting.   Discharge Exam: BP 130/61 (BP Location: Right Arm)   Pulse 86   Temp 98.1 F (36.7 C) (Oral)   Resp 17   Ht '5\' 7"'$  (1.702 m)   Wt 66.6 kg   SpO2 92%   BMI 22.99 kg/m   General: Pt is alert, awake, not in acute distress Cardiovascular: RRR, S1/S2 +, no rubs, no gallops Respiratory: CTA bilaterally, no wheezing, no rhonchi Abdominal: Soft, NT, ND, bowel sounds + Extremities: no edema, no cyanosis    The results of significant diagnostics from this hospitalization (  including imaging, microbiology, ancillary and laboratory) are listed below for reference.     Microbiology: No results found for this or any  previous visit (from the past 240 hour(s)).   Labs: Basic Metabolic Panel: Recent Labs  Lab 03/03/22 1546 03/04/22 1230 03/05/22 0404 03/06/22 0412 03/07/22 0415  NA 137 140 138 133* 136  K 3.1* 3.4* 3.5 3.6 3.4*  CL 110 111 108 106 110  CO2 21* 21* 22 18* 20*  GLUCOSE 103* 98 98 131* 100*  BUN 25* '17 15 15 18  '$ CREATININE 1.01 0.87 0.77 0.86 0.96  CALCIUM 8.0* 8.2* 8.1* 8.0* 7.5*  MG  --  2.2  --  2.2 2.2  PHOS  --  3.3 3.2  --   --    Liver Function Tests: Recent Labs  Lab 03/04/22 1230  AST 26  ALT 19  ALKPHOS 52  BILITOT 1.5*  PROT 5.7*  ALBUMIN 3.3*   CBC: Recent Labs  Lab 03/03/22 2039 03/04/22 1230 03/05/22 0404 03/06/22 0412 03/07/22 0415  WBC 7.2 7.7 7.4 7.8 9.4  NEUTROABS 5.0  --   --   --   --   HGB 11.6* 11.7* 11.8* 11.4* 10.0*  HCT 34.0* 34.4* 34.7* 32.2* 28.4*  MCV 93.7 93.0 92.8 91.0 91.9  PLT 191 197 187 181 176   CBG: No results for input(s): "GLUCAP" in the last 168 hours. Hgb A1c No results for input(s): "HGBA1C" in the last 72 hours. Lipid Profile No results for input(s): "CHOL", "HDL", "LDLCALC", "TRIG", "CHOLHDL", "LDLDIRECT" in the last 72 hours. Thyroid function studies No results for input(s): "TSH", "T4TOTAL", "T3FREE", "THYROIDAB" in the last 72 hours.  Invalid input(s): "FREET3" Urinalysis    Component Value Date/Time   COLORURINE YELLOW 03/06/2022 [place] 03/06/2022 1403   LABSPEC 1.009 03/06/2022 1403   PHURINE 5.0 03/06/2022 1403   GLUCOSEU NEGATIVE 03/06/2022 1403   HGBUR LARGE (A) 03/06/2022 1403   BILIRUBINUR NEGATIVE 03/06/2022 1403   BILIRUBINUR Neg 05/11/2019 1720   KETONESUR NEGATIVE 03/06/2022 1403   PROTEINUR NEGATIVE 03/06/2022 1403   UROBILINOGEN 0.2 05/11/2019 1720   NITRITE NEGATIVE 03/06/2022 1403   LEUKOCYTESUR NEGATIVE 03/06/2022 1403    FURTHER DISCHARGE INSTRUCTIONS:   Get Medicines reviewed and adjusted: Please take all your medications with you for your next visit with  your Primary MD   Laboratory/radiological data: Please request your Primary MD to go over all hospital tests and procedure/radiological results at the follow up, please ask your Primary MD to get all Hospital records sent to his/her office.   In some cases, they will be blood work, cultures and biopsy results pending at the time of your discharge. Please request that your primary care M.D. goes through all the records of your hospital data and follows up on these results.   Also Note the following: If you experience worsening of your admission symptoms, develop shortness of breath, life threatening emergency, suicidal or homicidal thoughts you must seek medical attention immediately by calling 911 or calling your MD immediately  if symptoms less severe.   You must read complete instructions/literature along with all the possible adverse reactions/side effects for all the Medicines you take and that have been prescribed to you. Take any new Medicines after you have completely understood and accpet all the possible adverse reactions/side effects.    Do not drive when taking Pain medications or sleeping medications (Benzodaizepines)   Do not take more than prescribed Pain, Sleep and Anxiety Medications. It  is not advisable to combine anxiety,sleep and pain medications without talking with your primary care practitioner   Special Instructions: If you have smoked or chewed Tobacco  in the last 2 yrs please stop smoking, stop any regular Alcohol  and or any Recreational drug use.   Wear Seat belts while driving.   Please note: You were cared for by a hospitalist during your hospital stay. Once you are discharged, your primary care physician will handle any further medical issues. Please note that NO REFILLS for any discharge medications will be authorized once you are discharged, as it is imperative that you return to your primary care physician (or establish a relationship with a primary care  physician if you do not have one) for your post hospital discharge needs so that they can reassess your need for medications and monitor your lab values.  Time coordinating discharge: 40 minutes  SIGNED:  [name], MD, PhD 03/07/2022, 9:27 AM

## 2022-03-07 NOTE — Plan of Care (Signed)

## 2022-03-07 NOTE — Care Management Important Message (Signed)
Important Message  Patient Details IM Letter placed in Patients room Name: NAYEF COLLEGE MRN: [id number] Date of Birth: 1928/10/15   Medicare Important Message Given:  Yes     Kerin Salen 03/07/2022, 1:41 PM

## 2022-03-12 DIAGNOSIS — R339 Retention of urine, unspecified: Secondary | ICD-10-CM | POA: Diagnosis not present

## 2022-03-12 DIAGNOSIS — F02818 Dementia in other diseases classified elsewhere, unspecified severity, with other behavioral disturbance: Secondary | ICD-10-CM | POA: Diagnosis not present

## 2022-03-12 DIAGNOSIS — S7002XA Contusion of left hip, initial encounter: Secondary | ICD-10-CM | POA: Diagnosis not present

## 2022-03-12 DIAGNOSIS — S065X0A Traumatic subdural hemorrhage without loss of consciousness, initial encounter: Secondary | ICD-10-CM | POA: Diagnosis not present

## 2022-03-12 DIAGNOSIS — T83091A Other mechanical complication of indwelling urethral catheter, initial encounter: Secondary | ICD-10-CM | POA: Diagnosis not present

## 2022-03-19 ENCOUNTER — Emergency Department (HOSPITAL_COMMUNITY)
Admission: EM | Admit: 2022-03-19 | Discharge: 2022-03-19 | Disposition: A | Discharge status: Skilled Nursing Facility | Payer: Medicare Other | Attending: Emergency Medicine | Admitting: Emergency Medicine

## 2022-03-19 ENCOUNTER — Other Ambulatory Visit: Payer: Self-pay

## 2022-03-19 DIAGNOSIS — Z79899 Other long term (current) drug therapy: Secondary | ICD-10-CM | POA: Diagnosis not present

## 2022-03-19 DIAGNOSIS — N179 Acute kidney failure, unspecified: Secondary | ICD-10-CM | POA: Insufficient documentation

## 2022-03-19 DIAGNOSIS — Z743 Need for continuous supervision: Secondary | ICD-10-CM | POA: Diagnosis not present

## 2022-03-19 DIAGNOSIS — F0390 Unspecified dementia without behavioral disturbance: Secondary | ICD-10-CM | POA: Diagnosis not present

## 2022-03-19 DIAGNOSIS — N138 Other obstructive and reflux uropathy: Secondary | ICD-10-CM | POA: Diagnosis not present

## 2022-03-19 DIAGNOSIS — R339 Retention of urine, unspecified: Secondary | ICD-10-CM | POA: Diagnosis not present

## 2022-03-19 DIAGNOSIS — Z7401 Bed confinement status: Secondary | ICD-10-CM | POA: Diagnosis not present

## 2022-03-19 DIAGNOSIS — N139 Obstructive and reflux uropathy, unspecified: Secondary | ICD-10-CM

## 2022-03-19 DIAGNOSIS — R109 Unspecified abdominal pain: Secondary | ICD-10-CM | POA: Diagnosis not present

## 2022-03-19 DIAGNOSIS — M545 Low back pain, unspecified: Secondary | ICD-10-CM | POA: Diagnosis not present

## 2022-03-19 DIAGNOSIS — R1030 Lower abdominal pain, unspecified: Secondary | ICD-10-CM | POA: Diagnosis not present

## 2022-03-19 DIAGNOSIS — R5383 Other fatigue: Secondary | ICD-10-CM | POA: Diagnosis not present

## 2022-03-19 DIAGNOSIS — F29 Unspecified psychosis not due to a substance or known physiological condition: Secondary | ICD-10-CM | POA: Diagnosis not present

## 2022-03-19 LAB — URINALYSIS, ROUTINE W REFLEX MICROSCOPIC
Bilirubin Urine: NEGATIVE
Glucose, UA: NEGATIVE mg/dL
Ketones, ur: NEGATIVE mg/dL
Leukocytes,Ua: NEGATIVE
Nitrite: NEGATIVE
Protein, ur: NEGATIVE mg/dL
Specific Gravity, Urine: 1.008 (ref 1.005–1.030)
pH: 6 (ref 5.0–8.0)

## 2022-03-19 LAB — COMPREHENSIVE METABOLIC PANEL
ALT: 18 U/L (ref 0–44)
AST: 21 U/L (ref 15–41)
Albumin: 3.2 g/dL — ABNORMAL LOW (ref 3.5–5.0)
Alkaline Phosphatase: 80 U/L (ref 38–126)
Anion gap: 9 (ref 5–15)
BUN: 41 mg/dL — ABNORMAL HIGH (ref 8–23)
CO2: 21 mmol/L — ABNORMAL LOW (ref 22–32)
Calcium: 7.8 mg/dL — ABNORMAL LOW (ref 8.9–10.3)
Chloride: 105 mmol/L (ref 98–111)
Creatinine, Ser: 4.05 mg/dL — ABNORMAL HIGH (ref 0.61–1.24)
GFR, Estimated: 13 mL/min — ABNORMAL LOW (ref >60)
Glucose, Bld: 96 mg/dL (ref 70–99)
Potassium: 3.6 mmol/L (ref 3.5–5.1)
Sodium: 135 mmol/L (ref 135–145)
Total Bilirubin: 1.3 mg/dL — ABNORMAL HIGH (ref 0.3–1.2)
Total Protein: 5.9 g/dL — ABNORMAL LOW (ref 6.5–8.1)

## 2022-03-19 LAB — CBC WITH DIFFERENTIAL/PLATELET
Abs Immature Granulocytes: 0.05 10*3/uL (ref 0.00–0.07)
Basophils Absolute: 0 10*3/uL (ref 0.0–0.1)
Basophils Relative: 1 %
Eosinophils Absolute: 0 10*3/uL (ref 0.0–0.5)
Eosinophils Relative: 0 %
HCT: 37.8 % — ABNORMAL LOW (ref 39.0–52.0)
Hemoglobin: 12.6 g/dL — ABNORMAL LOW (ref 13.0–17.0)
Immature Granulocytes: 1 %
Lymphocytes Relative: 11 %
Lymphs Abs: 0.9 10*3/uL (ref 0.7–4.0)
MCH: 32.5 pg (ref 26.0–34.0)
MCHC: 33.3 g/dL (ref 30.0–36.0)
MCV: 97.4 fL (ref 80.0–100.0)
Monocytes Absolute: 1.3 10*3/uL — ABNORMAL HIGH (ref 0.1–1.0)
Monocytes Relative: 15 %
Neutro Abs: 6.1 10*3/uL (ref 1.7–7.7)
Neutrophils Relative %: 72 %
Platelets: 185 10*3/uL (ref 150–400)
RBC: 3.88 MIL/uL — ABNORMAL LOW (ref 4.22–5.81)
RDW: 15.1 % (ref 11.5–15.5)
WBC: 8.3 10*3/uL (ref 4.0–10.5)
nRBC: 0 % (ref 0.0–0.2)

## 2022-03-19 LAB — LIPASE, BLOOD: Lipase: 36 U/L (ref 11–51)

## 2022-03-19 MED ORDER — LIDOCAINE HCL URETHRAL/MUCOSAL 2 % EX GEL
1.0000 | Freq: Once | CUTANEOUS | Status: AC
  Administered 2022-03-19: 1 via URETHRAL

## 2022-03-19 NOTE — ED Provider Notes (Signed)
[name] DEPT Provider Note   CSN: [id number] Arrival date & time: 03/19/22  1143     History  Chief Complaint  Patient presents with   Abdominal Pain    Jeffrey [name] is a 88 y.o. male.  Patient is a 88 year old male with a history of memory loss, TIAs, hyperlipidemia, BPH, electrolyte disturbance in the past who is presenting today from [facility] presenting today with complaints of abdominal pain.  Patient reports it hurts all the way around his lower abdomen and into his back.  He reports he has been leaking urine and he is constipated and has not been passing anything.  He denies any nausea or vomiting.  He is not aware of having a fever.  The facility did not report any fever.  Patient reports he is very unhappy with where he stays and he is in this predicament because they do not take care of him.  He is noted to have a legal guardian who is not currently here.  The history is provided by the patient.  Abdominal Pain      Home Medications Prior to Admission medications   Medication Sig Start Date End Date Taking? Authorizing Provider  acetaminophen (TYLENOL) 325 MG tablet Take 650 mg by mouth every 6 (six) hours as needed for headache, fever or mild pain.    [provider]  carbamide peroxide (DEBROX) 6.5 % OTIC solution Place 5 drops into both ears 2 (two) times daily. Patient taking differently: Place 5 drops into both ears See admin instructions. Administer 5 drops into each ear once daily for 5 days as needed for cerumen impaction 11/26/21   Masters, Joellen Jersey, DO  citalopram (CELEXA) 10 MG tablet Take 1 tablet (10 mg total) by mouth daily. Patient taking differently: Take 10 mg by mouth in the morning. 10/09/21   Suzzanne Cloud, NP  divalproex (DEPAKOTE SPRINKLE) 125 MG capsule Take 1 capsule (125 mg total) by mouth every 12 (twelve) hours. 03/06/22 04/05/22  Caren Griffins, MD  donepezil (ARICEPT) 10 MG tablet Take 1 tablet  (10 mg total) by mouth at bedtime. 10/09/21   Suzzanne Cloud, NP  ferrous gluconate (FERGON) 324 MG tablet Take 1 tablet (324 mg total) by mouth daily with breakfast. 03/07/22   Caren Griffins, MD  finasteride (PROSCAR) 5 MG tablet Take 1 tablet (5 mg total) by mouth daily. 03/08/22   Caren Griffins, MD  fluticasone (FLONASE) 50 MCG/ACT nasal spray Place 1 spray into both nostrils daily as needed for allergies.    [provider]  lidocaine (LIDODERM) 5 % Place 1 patch onto the skin daily. Remove & Discard patch within 12 hours or as directed by MD 03/07/22   Caren Griffins, MD  loratadine (CLARITIN) 10 MG tablet Take 1 tablet by mouth once daily Patient taking differently: Take 10 mg by mouth daily as needed for allergies (dizziness). 01/21/22   Masters, Katie, DO  LORazepam (ATIVAN) 0.5 MG tablet Take 1 tablet (0.5 mg total) by mouth every 6 (six) hours as needed (agitation, restlessness). 03/06/22   Caren Griffins, MD  memantine (NAMENDA) 10 MG tablet Take 1 tablet (10 mg total) by mouth 2 (two) times daily. 10/09/21   Suzzanne Cloud, NP  Multiple Vitamin (MULTIVITAMIN) tablet Take 1 tablet by mouth daily. Senior multivitamin.    [provider]  oxyCODONE (OXY IR/ROXICODONE) 5 MG immediate release tablet Take 1 tablet (5 mg total) by mouth every 4 (  four) hours as needed for severe pain or breakthrough pain. 03/06/22   Caren Griffins, MD  polyethylene glycol (MIRALAX / GLYCOLAX) 17 g packet Take 17 g by mouth daily. 11/26/21   Masters, Katie, DO  QUEtiapine (SEROQUEL) 25 MG tablet Take 1 tablet (25 mg total) by mouth at bedtime. 03/06/22   Caren Griffins, MD      Allergies    Flomax [tamsulosin] and Flonase [fluticasone propionate]    Review of Systems   Review of Systems  Gastrointestinal:  Positive for abdominal pain.    Physical Exam Updated Vital Signs BP 137/62   Pulse 70   Temp (!) 97.4 F (36.3 C) (Oral)   Resp 18   Wt 66.2 kg   SpO2 100%   BMI 22.87  kg/m  Physical Exam Vitals and nursing note reviewed.  Constitutional:      General: He is not in acute distress.    Appearance: He is well-developed.  HENT:     Head: Normocephalic and atraumatic.  Eyes:     Conjunctiva/sclera: Conjunctivae normal.     Pupils: Pupils are equal, round, and reactive to light.  Cardiovascular:     Rate and Rhythm: Normal rate and regular rhythm.     Heart sounds: No murmur heard. Pulmonary:     Effort: Pulmonary effort is normal. No respiratory distress.     Breath sounds: Normal breath sounds. No wheezing or rales.  Abdominal:     General: There is no distension.     Palpations: Abdomen is soft.     Tenderness: There is no abdominal tenderness. There is no guarding or rebound.     Hernia: No hernia is present.     Comments: Firm mass palpated from the pelvis up to the umbilicus minimally tender.  No notable hernias.  Minimal CVA tenderness.  Musculoskeletal:        General: No tenderness. Normal range of motion.     Cervical back: Normal range of motion and neck supple.  Skin:    General: Skin is warm and dry.     Findings: No erythema or rash.  Neurological:     Mental Status: He is alert and oriented to person, place, and time.  Psychiatric:        Behavior: Behavior normal.     ED Results / Procedures / Treatments   Labs (all labs ordered are listed, but only abnormal results are displayed) Labs Reviewed  COMPREHENSIVE METABOLIC PANEL - Abnormal; Notable for the following components:      Result Value   CO2 21 (*)    BUN 41 (*)    Creatinine, Ser 4.05 (*)    Calcium 7.8 (*)    Total Protein 5.9 (*)    Albumin 3.2 (*)    Total Bilirubin 1.3 (*)    GFR, Estimated 13 (*)    All other components within normal limits  CBC WITH DIFFERENTIAL/PLATELET - Abnormal; Notable for the following components:   RBC 3.88 (*)    Hemoglobin 12.6 (*)    HCT 37.8 (*)    Monocytes Absolute 1.3 (*)    All other components within normal limits   URINALYSIS, ROUTINE W REFLEX MICROSCOPIC - Abnormal; Notable for the following components:   Hgb urine dipstick MODERATE (*)    Bacteria, UA RARE (*)    All other components within normal limits  LIPASE, BLOOD    EKG None  Radiology No results found.  Procedures Procedures    Medications Ordered  in ED Medications  lidocaine (XYLOCAINE) 2 % jelly 1 Application (1 Application Urethral Given 03/19/22 1253)    ED Course/ Medical Decision Making/ A&P                           Medical Decision Making Amount and/or Complexity of Data Reviewed External Data Reviewed: notes. Labs: ordered. Decision-making details documented in ED Course.   Pt with multiple medical problems and comorbidities and presenting today with a complaint that caries a high risk for morbidity and mortality.  Today with lower abdominal pain and difficulty having bowel movements and urinating.  Patient has a palpable bladder to his umbilicus and sounds like he is having overflow incontinence in the setting of having BPH.  Concern for urinary retention.  Also concern for potential constipation.  Will relieve retention with a Foley catheter.  We will then do rectal exam.  Patient is otherwise nontoxic-appearing at this time.  Labs are pending.  1766m of urine was drained with foley placement.  Patient reports all his symptoms have resolved.  I independently interpreted patient's labs today and UA with blood but no evidence of infection, CMP with new AKI with creatinine of 4.0, normal electrolytes and CBC without acute findings.  Feel that patient's new AKI is related to obstructive uropathy.  Patient is otherwise well-appearing.  Rectal exam with enlarged prostate but no fecal impaction.  He is not displaying any symptoms suggestive of obstruction at this time or need for a CT scan.  Patient's guardian is now present and reports that he had a Foley catheter which he purposely removed 6 days ago.  Patient will need  follow-up with urology and is allergic to Flomax.  We will go home with catheter in place.  Facility will recheck BMP in 2 days.  He was given return precautions.  At this time no indication for admission.        Final Clinical Impression(s) / ED Diagnoses Final diagnoses:  Urinary retention  [place]) [place]  Obstructive uropathy    Rx / DC Orders ED Discharge Orders     None         PBlanchie Dessert MD 03/19/22 1445

## 2022-03-19 NOTE — Discharge Instructions (Addendum)
You need to keep the Foley catheter in until you see urology.  There was no sign of infection today but your kidney function was elevated because of not being able to urinate.  You need to have a repeat BMP done on Friday to ensure that the kidney function has gone back to normal.  If you develop fever, vomiting, recurrent abdominal pain you should return to the hospital.

## 2022-03-19 NOTE — ED Notes (Signed)
[facility] has been called to transport pt to facility.

## 2022-03-19 NOTE — ED Notes (Signed)
Transported back to [facility] via [facility]

## 2022-03-19 NOTE — ED Notes (Addendum)
Pt verbalizes "does not want to go back there to" [facility]. Moved to h/w to await transport.

## 2022-03-19 NOTE — ED Notes (Signed)
EDP at [facility] for rectal exam, no stool in vault. Chaperone present. Care Manager/guardian leaving at this time.

## 2022-03-19 NOTE — ED Triage Notes (Signed)
BIB [facility] from [facility] for RLQ abd pain, h/o constipation, unsure of last BM or last void, h/o dementia, VSS. Arrives alert, NAD, calm, interactive. Guardian was at scene.

## 2022-03-20 DIAGNOSIS — Z960 Presence of urogenital implants: Secondary | ICD-10-CM | POA: Diagnosis not present

## 2022-03-20 DIAGNOSIS — R1030 Lower abdominal pain, unspecified: Secondary | ICD-10-CM | POA: Diagnosis not present

## 2022-03-20 DIAGNOSIS — D649 Anemia, unspecified: Secondary | ICD-10-CM | POA: Diagnosis not present

## 2022-03-20 DIAGNOSIS — Z466 Encounter for fitting and adjustment of urinary device: Secondary | ICD-10-CM | POA: Diagnosis not present

## 2022-03-20 DIAGNOSIS — R339 Retention of urine, unspecified: Secondary | ICD-10-CM | POA: Diagnosis not present

## 2022-03-20 DIAGNOSIS — E785 Hyperlipidemia, unspecified: Secondary | ICD-10-CM | POA: Diagnosis not present

## 2022-03-20 DIAGNOSIS — R338 Other retention of urine: Secondary | ICD-10-CM | POA: Diagnosis not present

## 2022-03-20 DIAGNOSIS — M545 Low back pain, unspecified: Secondary | ICD-10-CM | POA: Diagnosis not present

## 2022-03-21 DIAGNOSIS — R338 Other retention of urine: Secondary | ICD-10-CM | POA: Diagnosis not present

## 2022-03-21 DIAGNOSIS — E785 Hyperlipidemia, unspecified: Secondary | ICD-10-CM | POA: Diagnosis not present

## 2022-03-21 DIAGNOSIS — D649 Anemia, unspecified: Secondary | ICD-10-CM | POA: Diagnosis not present

## 2022-03-21 DIAGNOSIS — Z466 Encounter for fitting and adjustment of urinary device: Secondary | ICD-10-CM | POA: Diagnosis not present

## 2022-03-24 DIAGNOSIS — E663 Overweight: Secondary | ICD-10-CM | POA: Diagnosis not present

## 2022-03-24 DIAGNOSIS — F331 Major depressive disorder, recurrent, moderate: Secondary | ICD-10-CM | POA: Diagnosis not present

## 2022-03-24 DIAGNOSIS — R451 Restlessness and agitation: Secondary | ICD-10-CM | POA: Diagnosis not present

## 2022-03-24 DIAGNOSIS — F419 Anxiety disorder, unspecified: Secondary | ICD-10-CM | POA: Diagnosis not present

## 2022-03-24 DIAGNOSIS — F03918 Unspecified dementia, unspecified severity, with other behavioral disturbance: Secondary | ICD-10-CM | POA: Diagnosis not present

## 2022-03-25 DIAGNOSIS — N401 Enlarged prostate with lower urinary tract symptoms: Secondary | ICD-10-CM | POA: Diagnosis not present

## 2022-03-25 DIAGNOSIS — Z9181 History of falling: Secondary | ICD-10-CM | POA: Diagnosis not present

## 2022-03-25 DIAGNOSIS — R339 Retention of urine, unspecified: Secondary | ICD-10-CM | POA: Diagnosis not present

## 2022-03-25 DIAGNOSIS — Z436 Encounter for attention to other artificial openings of urinary tract: Secondary | ICD-10-CM | POA: Diagnosis not present

## 2022-04-02 DIAGNOSIS — R339 Retention of urine, unspecified: Secondary | ICD-10-CM | POA: Diagnosis not present

## 2022-04-02 DIAGNOSIS — Z436 Encounter for attention to other artificial openings of urinary tract: Secondary | ICD-10-CM | POA: Diagnosis not present

## 2022-04-02 DIAGNOSIS — Z960 Presence of urogenital implants: Secondary | ICD-10-CM | POA: Diagnosis not present

## 2022-04-03 ENCOUNTER — Encounter (HOSPITAL_COMMUNITY): Payer: Self-pay

## 2022-04-03 ENCOUNTER — Other Ambulatory Visit: Payer: Self-pay

## 2022-04-03 ENCOUNTER — Emergency Department (HOSPITAL_COMMUNITY)
Admission: EM | Admit: 2022-04-03 | Discharge: 2022-04-03 | Disposition: A | Discharge status: Home / Self Care | Payer: Medicare Other | Attending: Emergency Medicine | Admitting: Emergency Medicine

## 2022-04-03 DIAGNOSIS — F0390 Unspecified dementia without behavioral disturbance: Secondary | ICD-10-CM | POA: Insufficient documentation

## 2022-04-03 DIAGNOSIS — R531 Weakness: Secondary | ICD-10-CM | POA: Diagnosis not present

## 2022-04-03 DIAGNOSIS — R310 Gross hematuria: Secondary | ICD-10-CM

## 2022-04-03 DIAGNOSIS — R339 Retention of urine, unspecified: Secondary | ICD-10-CM | POA: Insufficient documentation

## 2022-04-03 DIAGNOSIS — I10 Essential (primary) hypertension: Secondary | ICD-10-CM | POA: Diagnosis not present

## 2022-04-03 DIAGNOSIS — Z79899 Other long term (current) drug therapy: Secondary | ICD-10-CM | POA: Insufficient documentation

## 2022-04-03 DIAGNOSIS — Z7401 Bed confinement status: Secondary | ICD-10-CM | POA: Diagnosis not present

## 2022-04-03 DIAGNOSIS — R0689 Other abnormalities of breathing: Secondary | ICD-10-CM | POA: Diagnosis not present

## 2022-04-03 DIAGNOSIS — Z743 Need for continuous supervision: Secondary | ICD-10-CM | POA: Diagnosis not present

## 2022-04-03 DIAGNOSIS — R6889 Other general symptoms and signs: Secondary | ICD-10-CM | POA: Diagnosis not present

## 2022-04-03 DIAGNOSIS — T699XXA Effect of reduced temperature, unspecified, initial encounter: Secondary | ICD-10-CM | POA: Diagnosis not present

## 2022-04-03 LAB — CBC
HCT: 37 % — ABNORMAL LOW (ref 39.0–52.0)
Hemoglobin: 12.6 g/dL — ABNORMAL LOW (ref 13.0–17.0)
MCH: 33 pg (ref 26.0–34.0)
MCHC: 34.1 g/dL (ref 30.0–36.0)
MCV: 96.9 fL (ref 80.0–100.0)
Platelets: 161 10*3/uL (ref 150–400)
RBC: 3.82 MIL/uL — ABNORMAL LOW (ref 4.22–5.81)
RDW: 14.3 % (ref 11.5–15.5)
WBC: 6.5 10*3/uL (ref 4.0–10.5)
nRBC: 0 % (ref 0.0–0.2)

## 2022-04-03 LAB — URINALYSIS, ROUTINE W REFLEX MICROSCOPIC
Bacteria, UA: NONE SEEN
Bilirubin Urine: NEGATIVE
Glucose, UA: NEGATIVE mg/dL
Ketones, ur: 20 mg/dL — AB
Nitrite: NEGATIVE
Protein, ur: >=300 mg/dL — AB
RBC / HPF: >50 RBC/hpf — ABNORMAL HIGH (ref 0–5)
Specific Gravity, Urine: 1.013 (ref 1.005–1.030)
pH: 8 (ref 5.0–8.0)

## 2022-04-03 LAB — BASIC METABOLIC PANEL
Anion gap: 7 (ref 5–15)
BUN: 12 mg/dL (ref 8–23)
CO2: 24 mmol/L (ref 22–32)
Calcium: 8.6 mg/dL — ABNORMAL LOW (ref 8.9–10.3)
Chloride: 106 mmol/L (ref 98–111)
Creatinine, Ser: 0.88 mg/dL (ref 0.61–1.24)
GFR, Estimated: >60 mL/min (ref >60)
Glucose, Bld: 89 mg/dL (ref 70–99)
Potassium: 3.6 mmol/L (ref 3.5–5.1)
Sodium: 137 mmol/L (ref 135–145)

## 2022-04-03 MED ORDER — CEPHALEXIN 500 MG PO CAPS
500.0000 mg | ORAL_CAPSULE | Freq: Once | ORAL | Status: AC
  Administered 2022-04-03: 500 mg via ORAL
  Filled 2022-04-03: qty 1

## 2022-04-03 MED ORDER — HALOPERIDOL LACTATE 5 MG/ML IJ SOLN
2.0000 mg | Freq: Once | INTRAMUSCULAR | Status: AC
  Administered 2022-04-03: 2 mg via INTRAVENOUS
  Filled 2022-04-03: qty 1

## 2022-04-03 MED ORDER — LIDOCAINE HCL URETHRAL/MUCOSAL 2 % EX GEL: 1.0000 | Freq: Once | CUTANEOUS | Status: DC

## 2022-04-03 MED ORDER — LORAZEPAM 2 MG/ML IJ SOLN
1.0000 mg | Freq: Once | INTRAMUSCULAR | Status: AC
  Administered 2022-04-03: 1 mg via INTRAVENOUS
  Filled 2022-04-03: qty 1

## 2022-04-03 MED ORDER — CEPHALEXIN 500 MG PO CAPS: 500.0000 mg | ORAL_CAPSULE | Freq: Three times a day (TID) | ORAL | 0 refills | Status: DC

## 2022-04-03 MED ORDER — LIDOCAINE HCL URETHRAL/MUCOSAL 2 % EX GEL
1.0000 | Freq: Once | CUTANEOUS | Status: AC
  Administered 2022-04-03: 1 via URETHRAL

## 2022-04-03 NOTE — ED Notes (Signed)
Pt is now resting. [facility] has been called and asked to return to attempt transport back home.
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
## 2022-04-03 NOTE — ED Provider Notes (Signed)
  Physical Exam  BP (!) 147/65   Pulse (!) 58   Temp 97.9 F (36.6 C) (Oral)   Resp 16   Ht '5\' 5"'$  (1.651 m)   SpO2 99%   BMI 24.30 kg/m   Physical Exam  Procedures  Procedures  ED Course / MDM   Clinical Course as of 04/03/22 1813  Thu Apr 03, 2022  1250 Patient did attempt to get up and walk with his catheter bag still attached to the chair.  I had to redirect the patient so he would not pull on his catheter.  I spoke with nursing staff asking them to put a leg bag on so his catheter would not get tangled [JK]  1326 Case discussed with legal guardian.  Requesting some assistance to try to get patient placed into skilled nursing facility.  Also request urology evaluation here in the ED as patient is very unhappy about his current living situation.  He tries to elope when they try to bring him back to his facility [JK]  1401 Reviewed the case with Dr. Junious Silk.  He would not recommend removing the Foley catheter at this time.  We reviewed his medications and the patient is on appropriate therapy at this time [JK]  1603 CBC(!) CBC normal [JK]  0881 Basic metabolic panel(!) Metabolic panel normal [JK]    Clinical Course User Index [JK] Dorie Rank, MD   Medical Decision Making Care assumed at 4 PM.  Patient is here with hematuria.  He has been tugging on his Foley constantly.  Signed out pending urinalysis.  6:13 PM But around signout, patient pulled out his Foley.  Nurse was able to replace the Foley catheter.  There is some blood in his catheter.  His urinalysis was sent from his previous Foley and showed some blood and moderate leuks but no bacteria.  Since he required a Foley replacement today and he pulled out his Foley, will give antibiotics while waiting for urine culture.  Social worker has been discussing case with the health his proxy which is his son.  Patient will go back to the assisted living for now.  Amount and/or Complexity of Data Reviewed Labs: ordered.  Decision-making details documented in ED Course.  Risk Prescription drug management.          Drenda Freeze, MD 04/03/22 [id number]

## 2022-04-03 NOTE — ED Notes (Signed)
Pt reportedly pulled his urinary catheter out and moved from triage to exam room 1, blood at meatus reported, NAD, calm.
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
## 2022-04-03 NOTE — ED Notes (Addendum)
Tolerated well, calm, NAD, denies pain. Foley draining bloody.
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
## 2022-04-03 NOTE — Progress Notes (Signed)
Per provider's notes patient will be Dc back to facility. This CSW has set up home health with [facility]. CSW reached out to legal guardian to make him aware that [facility] was set up a HIPAA VM was left. TOC will follow DC.

## 2022-04-03 NOTE — Progress Notes (Signed)
TOC will continue to follow patient  until DC. Staff has reported that the patient is being combative at this time. [facility] will have to come back once the patient is calm.

## 2022-04-03 NOTE — ED Notes (Signed)
Reported to me that pt in room 5 had pulled his urinary cath out and he was bleeding.  I went to pts room and found him standing in his room.  Pts cath was lying on the floor and I ask pt what happened and he states he doesn't know.  Pt has blood coming thru his pants.  I ask pt to sit and he did.  He states he needs some help.  He doesn't understand what the catheter is and why he has it.  He does understand that he needs to be cleaned up.  Pts visit was for pain at cath site.  I got pt a reg room and moved him to [place].  Notified RN of pt. And  I helped pt get into a hosp gown and onto bed.  Placed hosp socks on pt. Pt had on a depends and it was saturated with blood and blood was running down pts leg.  I cleaned up his leg and removed depend and placed a diaper under pt.  Pt placed on pulse ox and PB cuff.  Pt resting in bed when I left room.

## 2022-04-03 NOTE — ED Triage Notes (Signed)
Per EMS- patient is from [facility]. Patient c/o foley cath pain. EMS reports that the urine is reddish in color.

## 2022-04-03 NOTE — Progress Notes (Signed)
Transition of Care Jeffrey [name]) - Emergency Department Mini Assessment   Patient Details  Name: Jeffrey [name] MRN: [id number] Date of Birth: Dec 02, 1928  Transition of Care Jeffrey [name]) CM/SW Contact:    Jeffrey Relic, LCSW Phone Number: 04/03/2022, 2:23 PM   Clinical Narrative: This CSW went to speak with Jeffrey [name] after seeing he is wanting assistance. Jeffrey [name] informed this CSW that they would like for the pt to be moved from [name] to a "secured skill nursing facility." Jeffrey [name] states he feels the pt is in need of more medical attention than what he is receiving. His main concern is the pt's aggressive behaviors and constant removing of the catheter. Jeffrey [name] states that he and his team are hopeful that the pt will get admitted so they can work on transitioning him elsewhere upon discharge. This CSW informed that we do not typically place into Memory Care Unit/Assisted Living Facilities and I will speak with [facility] leadership to see what our options are. Jeffrey [name] called this writer back at 1:55 PM to inform that the pt had ripped out his catheter. This CSW informed that the pt's behaviors are likely caused by his dementia. 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 Jeffrey [name] briefly touched on wanting "specialists to come to him in the secured SNF because right now he has to go to appointments." Jeffrey [name] states that the pt already has sitters/aides at [name] and that they still do not prevent the behaviors "because if he asks them to leave or tell them to get out of the room, they have to do that." This CSW has reached out to Dr. Tomi [name] who reports there are scans pending and uncertain if the pt will require admission at this time. Jeffrey [name] verbalized understanding that if the pt is not admitted, he would have to return to [name] and they will have to transfer him to another facility from  there. Jeffrey [name] reported he and his team have already looked at [name] and [facility] which are both SNF.    ED Mini Assessment: What brought you to the Emergency Department? : foley cath pain  Barriers to Discharge: No Barriers Identified     Means of departure: Not know       Patient Contact and Communications [name] Contact 1: Jeffrey [name]- Legal Guardian   Spoke with: Jeffrey [name] Date: 04/03/22,   Contact time: [place] Phone Number: [phone]           Admission diagnosis:  cath pain Patient Active Problem List   Diagnosis Date Noted   DNR (do not resuscitate) 03/06/2022   Malnutrition of moderate degree 03/05/2022   Thyroid nodule 03/04/2022   Subdural hematoma ([place]) 03/03/2022   Rib fracture 03/03/2022   Anemia 03/03/2022   Elevated lactic acid level 03/03/2022   Healthcare maintenance 11/27/2021   Goals of care, counseling/discussion 11/12/2021   Dementia ([place]) 06/20/2020   Hypokalemia 05/11/2020   Chronic constipation 17-Oct-2019   Death of wife March 03, 2019   Cerumen impaction 03/14/2018   Esophageal stenosis 01/09/2017   Left ankle pain 11/05/2015   Allergic rhinitis 12/23/2012   History of melanoma 09/22/2011   Schamberg's purpura 08/01/2010   ELEVATED BLOOD PRESSURE WITHOUT DIAGNOSIS OF HYPERTENSION 10/03/2008   TRANSIENT ISCHEMIC ATTACK 08/24/2007   PCP:  System, Provider Not In Pharmacy:   [facility], [place].  [place]. [place] [address] Phone: [phone] Fax: [phone]

## 2022-04-03 NOTE — ED Notes (Signed)
Pt is now calm and [facility] was able to transport him home without issues.

## 2022-04-03 NOTE — ED Notes (Signed)
Alert, NAD, calm, interactive, resps e/u, speaking clearly, VSS, pleasant with rambling stories and confabulation. Denies pain or other sx.

## 2022-04-03 NOTE — Discharge Instructions (Signed)
You have gross hematuria likely from pulling on the Foley.  Since you have pulled out her Foley and we had to replace it today.  I ordered antibiotics to prevent infection.  Please take Keflex 3 times a day for 5 days.  You need to call urology tomorrow to get an appointment  Return to ER if you have worse blood in your urine, Foley clotted or severe pain at the Foley site or fever.

## 2022-04-05 DIAGNOSIS — R7402 Elevation of levels of lactic acid dehydrogenase (LDH): Secondary | ICD-10-CM | POA: Diagnosis not present

## 2022-04-05 DIAGNOSIS — E559 Vitamin D deficiency, unspecified: Secondary | ICD-10-CM | POA: Diagnosis not present

## 2022-04-05 DIAGNOSIS — E119 Type 2 diabetes mellitus without complications: Secondary | ICD-10-CM | POA: Diagnosis not present

## 2022-04-05 DIAGNOSIS — F0390 Unspecified dementia without behavioral disturbance: Secondary | ICD-10-CM | POA: Diagnosis not present

## 2022-04-05 DIAGNOSIS — F419 Anxiety disorder, unspecified: Secondary | ICD-10-CM | POA: Diagnosis not present

## 2022-04-05 DIAGNOSIS — Z131 Encounter for screening for diabetes mellitus: Secondary | ICD-10-CM | POA: Diagnosis not present

## 2022-04-05 DIAGNOSIS — E785 Hyperlipidemia, unspecified: Secondary | ICD-10-CM | POA: Diagnosis not present

## 2022-04-06 DIAGNOSIS — Z466 Encounter for fitting and adjustment of urinary device: Secondary | ICD-10-CM | POA: Diagnosis not present

## 2022-04-06 DIAGNOSIS — N401 Enlarged prostate with lower urinary tract symptoms: Secondary | ICD-10-CM | POA: Diagnosis not present

## 2022-04-06 DIAGNOSIS — R338 Other retention of urine: Secondary | ICD-10-CM | POA: Diagnosis not present

## 2022-04-06 DIAGNOSIS — Z87440 Personal history of urinary (tract) infections: Secondary | ICD-10-CM | POA: Diagnosis not present

## 2022-04-06 DIAGNOSIS — Z9181 History of falling: Secondary | ICD-10-CM | POA: Diagnosis not present

## 2022-04-06 DIAGNOSIS — F0390 Unspecified dementia without behavioral disturbance: Secondary | ICD-10-CM | POA: Diagnosis not present

## 2022-04-06 DIAGNOSIS — E785 Hyperlipidemia, unspecified: Secondary | ICD-10-CM | POA: Diagnosis not present

## 2022-04-06 LAB — URINE CULTURE: Culture: >=100000 — AB

## 2022-04-07 ENCOUNTER — Telehealth (HOSPITAL_BASED_OUTPATIENT_CLINIC_OR_DEPARTMENT_OTHER): Payer: Self-pay | Admitting: Emergency Medicine

## 2022-04-07 NOTE — Progress Notes (Signed)
ED Antimicrobial Stewardship Positive Culture Follow Up   Jeffrey [name] is an 88 y.o. male who presented to [facility] on 04/03/2022 with a chief complaint of foley catheter pain   Recent Results (from the past 720 hour(s))  Urine Culture     Status: Abnormal   Collection Time: 04/03/22  3:10 PM   Specimen: Urine, Catheterized  Result Value Ref Range Status   Specimen Description   Final    URINE, CATHETERIZED Performed at [facility] [address] [place], [place] [address]    Special Requests   Final    NONE Performed at [facility], [place] [address] [place], [place] [address]    Culture (A)  Final    >=100,000 COLONIES/mL ACINETOBACTER CALCOACETICUS/BAUMANNII COMPLEX 80,000 COLONIES/mL ENTEROCOCCUS FAECALIS    Report Status 04/06/2022 FINAL  Final   Organism ID, Bacteria ACINETOBACTER CALCOACETICUS/BAUMANNII COMPLEX (A)  Final   Organism ID, Bacteria ENTEROCOCCUS FAECALIS (A)  Final      Susceptibility   Acinetobacter calcoaceticus/baumannii complex - MIC*    CEFTAZIDIME 4 SENSITIVE Sensitive     CIPROFLOXACIN <=0.25 SENSITIVE Sensitive     GENTAMICIN <=1 SENSITIVE Sensitive     IMIPENEM <=0.25 SENSITIVE Sensitive     PIP/TAZO <=4 SENSITIVE Sensitive     TRIMETH/SULFA <=20 SENSITIVE Sensitive     AMPICILLIN/SULBACTAM <=2 SENSITIVE Sensitive     * >=100,000 COLONIES/mL ACINETOBACTER CALCOACETICUS/BAUMANNII COMPLEX   Enterococcus faecalis - MIC*    AMPICILLIN <=2 SENSITIVE Sensitive     NITROFURANTOIN <=16 SENSITIVE Sensitive     VANCOMYCIN 1 SENSITIVE Sensitive     * 80,000 COLONIES/mL ENTEROCOCCUS FAECALIS   Patient seen in the ED for foley catheter pain.  Patient without signs of infection in the ED.  Culture obtained from home foley catheter.  Patient removed catheter and was replaced by ED staff.  Suspect positive culture is colonization from chronic foley catheter.  Plan: stop cephalexin. No further antibiotics  needed  ED Provider: Davonna Belling, MD   Candie Mile 04/07/2022, 9:18 AM Clinical Pharmacist Monday - Friday phone -  [phone] Saturday - Sunday phone - [phone]

## 2022-04-07 NOTE — Telephone Encounter (Signed)
Post ED Visit - Positive Culture Follow-up: Successful Patient Follow-Up  Culture assessed and recommendations reviewed by:  '[]'$  Elenor Quinones, Pharm.D. '[]'$  Heide Guile, Pharm.D., BCPS AQ-ID '[]'$  Parks Neptune, Pharm.D., BCPS '[]'$  Alycia Rossetti, Pharm.D., BCPS '[]'$  [place], [place].D., BCPS, AAHIVP '[]'$  Legrand Como, Pharm.D., BCPS, AAHIVP '[]'$  Salome Arnt, PharmD, BCPS '[]'$  Johnnette Gourd, PharmD, BCPS '[]'$  Hughes Better, PharmD, BCPS '[]'$  Leeroy Cha, PharmD  Positive urine culture  '[]'$  Patient discharged without antimicrobial prescription and treatment is now indicated '[]'$  Organism is resistant to prescribed ED discharge antimicrobial '[]'$  Patient with positive blood cultures  Changes discussed with ED provider: Dr  Davonna Belling New antibiotic prescription stop cephalexin due to presumed colonization  Spoke with [facility] @ [phone] Faxed order to [facility] [place] 04/07/2022, 11:39 AM

## 2022-04-09 DIAGNOSIS — E785 Hyperlipidemia, unspecified: Secondary | ICD-10-CM | POA: Diagnosis not present

## 2022-04-09 DIAGNOSIS — R7402 Elevation of levels of lactic acid dehydrogenase (LDH): Secondary | ICD-10-CM | POA: Diagnosis not present

## 2022-04-09 DIAGNOSIS — Z131 Encounter for screening for diabetes mellitus: Secondary | ICD-10-CM | POA: Diagnosis not present

## 2022-04-09 DIAGNOSIS — R310 Gross hematuria: Secondary | ICD-10-CM | POA: Diagnosis not present

## 2022-04-09 DIAGNOSIS — F0390 Unspecified dementia without behavioral disturbance: Secondary | ICD-10-CM | POA: Diagnosis not present

## 2022-04-14 ENCOUNTER — Other Ambulatory Visit: Payer: Self-pay

## 2022-04-14 ENCOUNTER — Emergency Department (HOSPITAL_COMMUNITY): Payer: Medicare Other

## 2022-04-14 ENCOUNTER — Encounter (HOSPITAL_COMMUNITY): Payer: Self-pay

## 2022-04-14 ENCOUNTER — Observation Stay (HOSPITAL_COMMUNITY)
Admission: EM | Admit: 2022-04-14 | Discharge: 2022-04-16 | Disposition: A | Discharge status: Home Health | Payer: Medicare Other | Attending: Emergency Medicine | Admitting: Emergency Medicine

## 2022-04-14 DIAGNOSIS — M2681 Anterior soft tissue impingement: Secondary | ICD-10-CM | POA: Insufficient documentation

## 2022-04-14 DIAGNOSIS — F0390 Unspecified dementia without behavioral disturbance: Secondary | ICD-10-CM | POA: Diagnosis not present

## 2022-04-14 DIAGNOSIS — R9431 Abnormal electrocardiogram [ECG] [EKG]: Secondary | ICD-10-CM | POA: Diagnosis not present

## 2022-04-14 DIAGNOSIS — W19XXXA Unspecified fall, initial encounter: Secondary | ICD-10-CM

## 2022-04-14 DIAGNOSIS — S065XAA Traumatic subdural hemorrhage with loss of consciousness status unknown, initial encounter: Secondary | ICD-10-CM | POA: Diagnosis present

## 2022-04-14 DIAGNOSIS — M4312 Spondylolisthesis, cervical region: Secondary | ICD-10-CM | POA: Diagnosis not present

## 2022-04-14 DIAGNOSIS — S0990XA Unspecified injury of head, initial encounter: Secondary | ICD-10-CM | POA: Diagnosis not present

## 2022-04-14 DIAGNOSIS — M6281 Muscle weakness (generalized): Secondary | ICD-10-CM | POA: Diagnosis not present

## 2022-04-14 DIAGNOSIS — S065X0A Traumatic subdural hemorrhage without loss of consciousness, initial encounter: Secondary | ICD-10-CM | POA: Insufficient documentation

## 2022-04-14 DIAGNOSIS — Z87891 Personal history of nicotine dependence: Secondary | ICD-10-CM | POA: Insufficient documentation

## 2022-04-14 DIAGNOSIS — S4992XA Unspecified injury of left shoulder and upper arm, initial encounter: Secondary | ICD-10-CM | POA: Diagnosis not present

## 2022-04-14 DIAGNOSIS — Z8673 Personal history of transient ischemic attack (TIA), and cerebral infarction without residual deficits: Secondary | ICD-10-CM | POA: Insufficient documentation

## 2022-04-14 DIAGNOSIS — W01198A Fall on same level from slipping, tripping and stumbling with subsequent striking against other object, initial encounter: Secondary | ICD-10-CM | POA: Diagnosis not present

## 2022-04-14 DIAGNOSIS — Z79899 Other long term (current) drug therapy: Secondary | ICD-10-CM | POA: Diagnosis not present

## 2022-04-14 DIAGNOSIS — S2242XA Multiple fractures of ribs, left side, initial encounter for closed fracture: Principal | ICD-10-CM | POA: Insufficient documentation

## 2022-04-14 DIAGNOSIS — Z743 Need for continuous supervision: Secondary | ICD-10-CM | POA: Diagnosis not present

## 2022-04-14 DIAGNOSIS — Z85828 Personal history of other malignant neoplasm of skin: Secondary | ICD-10-CM | POA: Insufficient documentation

## 2022-04-14 DIAGNOSIS — M25552 Pain in left hip: Secondary | ICD-10-CM | POA: Diagnosis not present

## 2022-04-14 DIAGNOSIS — R58 Hemorrhage, not elsewhere classified: Secondary | ICD-10-CM | POA: Diagnosis not present

## 2022-04-14 DIAGNOSIS — R262 Difficulty in walking, not elsewhere classified: Secondary | ICD-10-CM | POA: Insufficient documentation

## 2022-04-14 LAB — COMPREHENSIVE METABOLIC PANEL
ALT: 13 U/L (ref 0–44)
AST: 17 U/L (ref 15–41)
Albumin: 2.8 g/dL — ABNORMAL LOW (ref 3.5–5.0)
Alkaline Phosphatase: 68 U/L (ref 38–126)
Anion gap: 8 (ref 5–15)
BUN: 13 mg/dL (ref 8–23)
CO2: 22 mmol/L (ref 22–32)
Calcium: 8.3 mg/dL — ABNORMAL LOW (ref 8.9–10.3)
Chloride: 106 mmol/L (ref 98–111)
Creatinine, Ser: 0.95 mg/dL (ref 0.61–1.24)
GFR, Estimated: >60 mL/min (ref >60)
Glucose, Bld: 94 mg/dL (ref 70–99)
Potassium: 3.5 mmol/L (ref 3.5–5.1)
Sodium: 136 mmol/L (ref 135–145)
Total Bilirubin: 0.7 mg/dL (ref 0.3–1.2)
Total Protein: 5.4 g/dL — ABNORMAL LOW (ref 6.5–8.1)

## 2022-04-14 LAB — CBC WITH DIFFERENTIAL/PLATELET
Abs Immature Granulocytes: 0.02 10*3/uL (ref 0.00–0.07)
Basophils Absolute: 0 10*3/uL (ref 0.0–0.1)
Basophils Relative: 1 %
Eosinophils Absolute: 0 10*3/uL (ref 0.0–0.5)
Eosinophils Relative: 1 %
HCT: 34.6 % — ABNORMAL LOW (ref 39.0–52.0)
Hemoglobin: 11.4 g/dL — ABNORMAL LOW (ref 13.0–17.0)
Immature Granulocytes: 0 %
Lymphocytes Relative: 13 %
Lymphs Abs: 1.2 10*3/uL (ref 0.7–4.0)
MCH: 32.3 pg (ref 26.0–34.0)
MCHC: 32.9 g/dL (ref 30.0–36.0)
MCV: 98 fL (ref 80.0–100.0)
Monocytes Absolute: 1.1 10*3/uL — ABNORMAL HIGH (ref 0.1–1.0)
Monocytes Relative: 12 %
Neutro Abs: 6.6 10*3/uL (ref 1.7–7.7)
Neutrophils Relative %: 73 %
Platelets: 177 10*3/uL (ref 150–400)
RBC: 3.53 MIL/uL — ABNORMAL LOW (ref 4.22–5.81)
RDW: 13.8 % (ref 11.5–15.5)
WBC: 8.9 10*3/uL (ref 4.0–10.5)
nRBC: 0 % (ref 0.0–0.2)

## 2022-04-14 LAB — PROTIME-INR
INR: 1.1 (ref 0.8–1.2)
Prothrombin Time: 14.3 seconds (ref 11.4–15.2)

## 2022-04-14 MED ORDER — OXYCODONE HCL 5 MG PO TABS: 5.0000 mg | ORAL_TABLET | ORAL | Status: DC | PRN

## 2022-04-14 MED ORDER — ONDANSETRON HCL 4 MG/2ML IJ SOLN: 4.0000 mg | Freq: Four times a day (QID) | INTRAMUSCULAR | Status: DC | PRN

## 2022-04-14 MED ORDER — DONEPEZIL HCL 10 MG PO TABS: 10.0000 mg | ORAL_TABLET | Freq: Every day | ORAL | Status: DC

## 2022-04-14 MED ORDER — QUETIAPINE FUMARATE 50 MG PO TABS
25.0000 mg | ORAL_TABLET | Freq: Every day | ORAL | Status: DC
  Administered 2022-04-15 (×2): 25 mg via ORAL
  Filled 2022-04-14 (×2): qty 1

## 2022-04-14 MED ORDER — ACETAMINOPHEN 325 MG PO TABS: 650.0000 mg | ORAL_TABLET | ORAL | Status: DC | PRN

## 2022-04-14 MED ORDER — ONDANSETRON 4 MG PO TBDP: 4.0000 mg | ORAL_TABLET | Freq: Four times a day (QID) | ORAL | Status: DC | PRN

## 2022-04-14 MED ORDER — FINASTERIDE 5 MG PO TABS
5.0000 mg | ORAL_TABLET | Freq: Every day | ORAL | Status: DC
  Administered 2022-04-15 – 2022-04-16 (×2): 5 mg via ORAL
  Filled 2022-04-14 (×2): qty 1

## 2022-04-14 MED ORDER — POLYETHYLENE GLYCOL 3350 17 G PO PACK
17.0000 g | PACK | Freq: Every day | ORAL | Status: DC
  Administered 2022-04-15 – 2022-04-16 (×2): 17 g via ORAL
  Filled 2022-04-14 (×2): qty 1

## 2022-04-14 MED ORDER — LORAZEPAM 0.5 MG PO TABS
0.5000 mg | ORAL_TABLET | Freq: Four times a day (QID) | ORAL | Status: DC | PRN
  Administered 2022-04-16: 0.5 mg via ORAL
  Filled 2022-04-14: qty 1

## 2022-04-14 MED ORDER — CITALOPRAM HYDROBROMIDE 20 MG PO TABS
10.0000 mg | ORAL_TABLET | Freq: Every day | ORAL | Status: DC
  Administered 2022-04-15 – 2022-04-16 (×2): 10 mg via ORAL
  Filled 2022-04-14 (×2): qty 1

## 2022-04-14 MED ORDER — DIVALPROEX SODIUM 125 MG PO CSDR: 125.0000 mg | DELAYED_RELEASE_CAPSULE | Freq: Two times a day (BID) | ORAL | Status: DC

## 2022-04-14 MED ORDER — MEMANTINE HCL 10 MG PO TABS: 10.0000 mg | ORAL_TABLET | Freq: Two times a day (BID) | ORAL | Status: DC

## 2022-04-14 NOTE — ED Triage Notes (Addendum)
Patient BIB [facility] from [facility]. Had an unwitnessed fall and hit the left side of his head on the floor. No LOC. He said he was sleeping and got up and lost his balance. No blood thinners. Has dementia. Unknown when last tetanus was.

## 2022-04-14 NOTE — ED Triage Notes (Signed)
Pt BIB [facility] coming from [facility]. Pt came in for a unwitnessed fall from [facility]. A CT scan was done at [facility]. Pt has had numerous previous falls.

## 2022-04-14 NOTE — H&P (Addendum)
Admitting Physician: Nickola Major Rowan Pollman  Service: Trauma Surgery  CC: Fall  Subjective   Mechanism of Injury: Jeffrey [name] is an 88 y.o. male who presented as a trauma transfer after a fall.  The fall was unwitnessed at [facility] place.  He has DNR paperwork and paperwork listing his medical decision and financial decision makers.  Past Medical History:  Diagnosis Date   Adenomatous polyp of colon 1995   w/ HGD   Advance care planning 12/29/2013   Advance directive- Sister [place] designated patient were incapacitated.   Allergic rhinitis    Bilateral cold feet 03/07/2020   BPH (benign prostatic hyperplasia)    COLONIC POLYPS, HX OF 08/24/2006   Qualifier: Diagnosis of  By: Council Mechanic MD, Hilaria Ota    Diverticulosis 09/21/2000   DIVERTICULOSIS, COLON 09/21/2000   Qualifier: Diagnosis of  By: Council Mechanic MD, Hilaria Ota    Dizziness 03/07/2020   Dysfunction of eustachian tube 05/01/2011   ED (erectile dysfunction)    ED (erectile dysfunction) 11/14/2010   EXTERNAL HEMORRHOIDS 05/02/2010   Qualifier: Diagnosis of  By: Damita Dunnings MD, Phillip Heal     Hyperlipemia 05/05/1988   HYPERTROPHY PROSTATE W/UR OBST & OTH LUTS 10/09/2009   Pt uninterested in further testing.    Hypokalemia 05/11/2020   Hyponatremia 05/11/2020   Infected sebaceous cyst 01/25/2020   Knee pain 08/13/2011   Lung nodule 02/03/2019   Medicare annual wellness visit, subsequent 12/22/2011   Memory change 01/03/2016   Memory loss    Nasal sinus congestion 03/08/2019   Paresthesia of foot 08/13/2011   Rectal bleeding 09/26/2019   Schamberg's purpura    2012- eval by Dr. Allyson Sabal   Sebaceous cyst    Shoulder pain, right 08/24/2016   Skin cancer 05/05/1998   hx of Scalp-melanoma   TIA (transient ischemic attack)     Past Surgical History:  Procedure Laterality Date   Carotid U/S Nml  09/08/2007   Colon polyp  2011   B9 Int Hemms   Colonoscopy polyps  10/05/2003   Divertics in hemms    COLONOSCOPY W/ POLYPECTOMY  08/1993; 09/21/2000;10/05/2003   B9   Echo (other)  09/08/2007   nml lvf EF 50-55% Mild Diast Dysfctn Triv Ar   melanoma scalp  07/2003   SEPTOPLASTY  1995   Skin revision vertex of scalp  2009   Dr. Luetta Nutting, [facility]   TONSILLECTOMY      Family History  Problem Relation Age of Onset   Rectal cancer Father    Colon cancer Paternal Aunt    Other Paternal Aunt        cancer on back of neck   Prostate cancer Neg Hx     Social:  reports that he quit smoking about 63 years ago. His smoking use included cigarettes. He has never used smokeless tobacco. He reports that he does not drink alcohol and does not use drugs.  Allergies:  Allergies  Allergen Reactions   Flomax [Tamsulosin] Other (See Comments)    Flushing  Arm stiffness   Flonase [Fluticasone Propionate] Other (See Comments)    Epistaxis    Medications: Current Outpatient Medications  Medication Instructions   acetaminophen (TYLENOL) 650 mg, Oral, Every 6 hours PRN   carbamide peroxide (DEBROX) 6.5 % OTIC solution 5 drops, Both EARS, 2 times daily   cephALEXin (KEFLEX) 500 mg, Oral, 3 times daily   citalopram (CELEXA) 10 mg, Oral, Daily   divalproex (DEPAKOTE SPRINKLE) 125 mg, Oral, Every 12 hours  donepezil (ARICEPT) 10 mg, Oral, Daily at bedtime   ferrous gluconate (FERGON) 324 mg, Oral, Daily with breakfast   finasteride (PROSCAR) 5 mg, Oral, Daily   fluticasone (FLONASE) 50 MCG/ACT nasal spray 1 spray, Each Nare, Daily PRN   lidocaine (LIDODERM) 5 % 1 patch, Transdermal, Every 24 hours, Remove & Discard patch within 12 hours or as directed by MD   loratadine (CLARITIN) 10 MG tablet Oral, Daily   LORazepam (ATIVAN) 0.5 mg, Oral, Every 6 hours PRN   memantine (NAMENDA) 10 mg, Oral, 2 times daily   Multiple Vitamin (MULTIVITAMIN) tablet 1 tablet, Oral, Daily, Senior multivitamin.   oxyCODONE (OXY IR/ROXICODONE) 5 mg, Oral, Every 4 hours PRN   polyethylene glycol (MIRALAX / GLYCOLAX) 17 g, Oral, Daily    QUEtiapine (SEROQUEL) 25 mg, Oral, Daily at bedtime    Objective   Primary Survey: Blood pressure (!) 117/52, pulse 78, temperature 98.8 F (37.1 C), temperature source Oral, resp. rate 16, height '5\' 5"'$  (1.651 m), weight 66.2 kg, SpO2 99 %. Airway: Patent, protecting airway Breathing: Bilateral breath sounds, breathing spontaneously Circulation: Stable, Palpable peripheral pulses Disability: Moving all extremities,   GCS Eyes: 4 - Eyes open spontaneously  GCS Verbal: 5 - Oriented  GCS Motor: 6 - Obeys commands for movement  GCS 15  Environment/Exposure: Warm, dry  Secondary Survey: Head: Normocephalic, atraumatic Neck:  c-collar Chest: Bilateral breath sounds, chest wall stable - tenderness over right chest wall Abdomen: Soft, non-tender, non-distended Upper Extremities: Strength and sensation intact, palpable peripheral pulses Lower extremities: Strength and sensation intact, palpable peripheral pulses Back: No step offs or deformities, atraumatic Rectal:  deferred Psych: Normal mood and affect    Results for orders placed or performed during the hospital encounter of 04/14/22 (from the past 24 hour(s))  Comprehensive metabolic panel     Status: Abnormal   Collection Time: 04/14/22  4:46 PM  Result Value Ref Range   Sodium 136 135 - 145 mmol/L   Potassium 3.5 3.5 - 5.1 mmol/L   Chloride 106 98 - 111 mmol/L   CO2 22 22 - 32 mmol/L   Glucose, Bld 94 70 - 99 mg/dL   BUN 13 8 - 23 mg/dL   Creatinine, Ser 0.95 0.61 - 1.24 mg/dL   Calcium 8.3 (L) 8.9 - 10.3 mg/dL   Total Protein 5.4 (L) 6.5 - 8.1 g/dL   Albumin 2.8 (L) 3.5 - 5.0 g/dL   AST 17 15 - 41 U/L   ALT 13 0 - 44 U/L   Alkaline Phosphatase 68 38 - 126 U/L   Total Bilirubin 0.7 0.3 - 1.2 mg/dL   GFR, Estimated >60 >60 mL/min   Anion gap 8 5 - 15  CBC with Differential     Status: Abnormal   Collection Time: 04/14/22  4:46 PM  Result Value Ref Range   WBC 8.9 4.0 - 10.5 K/uL   RBC 3.53 (L) 4.22 - 5.81 MIL/uL    Hemoglobin 11.4 (L) 13.0 - 17.0 g/dL   HCT 34.6 (L) 39.0 - 52.0 %   MCV 98.0 80.0 - 100.0 fL   MCH 32.3 26.0 - 34.0 pg   MCHC 32.9 30.0 - 36.0 g/dL   RDW 13.8 11.5 - 15.5 %   Platelets 177 150 - 400 K/uL   nRBC 0.0 0.0 - 0.2 %   Neutrophils Relative % 73 %   Neutro Abs 6.6 1.7 - 7.7 K/uL   Lymphocytes Relative 13 %   Lymphs Abs 1.2 0.7 -  4.0 K/uL   Monocytes Relative 12 %   Monocytes Absolute 1.1 (H) 0.1 - 1.0 K/uL   Eosinophils Relative 1 %   Eosinophils Absolute 0.0 0.0 - 0.5 K/uL   Basophils Relative 1 %   Basophils Absolute 0.0 0.0 - 0.1 K/uL   Immature Granulocytes 0 %   Abs Immature Granulocytes 0.02 0.00 - 0.07 K/uL  Protime-INR     Status: None   Collection Time: 04/14/22  8:49 PM  Result Value Ref Range   Prothrombin Time 14.3 11.4 - 15.2 seconds   INR 1.1 0.8 - 1.2     Imaging Orders         CT Head Wo Contrast         CT Cervical Spine Wo Contrast         DG Shoulder Left         CT Chest Wo Contrast      Assessment and Plan   Jeffrey [name] is an 88 y.o. male who presented as a trauma transfer after a fall.  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  Dispo - Med-Surg FWashburn MD  [facility] Use [email or web address] to contact on call provider  New Patient Billing: [phone]- High MDM

## 2022-04-14 NOTE — ED Provider Notes (Signed)
   10:24 PM Arrives from [facility] for trauma eval.  Patient had acute on chronic bilateral SDH with 74m midline shift, fractures left 3rd, 4th, 5th rib fractures.  He denies any pain currently.  Denies SOB or pain with breathing.  Trauma to eval.  Trauma team admitting for ongoing care.   SLarene Pickett PA-C 04/14/22 2[place] Ankit, MD 04/15/22 1239

## 2022-04-14 NOTE — ED Provider Notes (Signed)
[place] DEPT Provider Note   CSN: [id number] Arrival date & time: 04/14/22  1358     History  Chief Complaint  Patient presents with   Jeffrey [name] is a 88 y.o. male.  With history of dementia, hyperlipidemia, previous TIA, BPH who presents the ED for evaluation of a fall.  EMS reports to be unwitnessed.  Patient does live in an assisted living home.  He reports that he was taking a nap in his recliner when he stood up and tripped forward.  He hit his head and then slid across the hardwood floor.  Denies loss of consciousness. Denies blood thinners. States he tripped and lost his balance. Denies dizziness or light headedness. Currently has no complaints. Specifically denies headache, vision changes, numbness, weakness, tingling, chest pain, shortness of breath, shoulder, elbow, wrist, hip, knee, ankle, back pain. Patient is a DNR.   Fall       Home Medications Prior to Admission medications   Medication Sig Start Date End Date Taking? Authorizing Provider  acetaminophen (TYLENOL) 325 MG tablet Take 650 mg by mouth every 6 (six) hours as needed for headache, fever or mild pain.    [provider]  carbamide peroxide (DEBROX) 6.5 % OTIC solution Place 5 drops into both ears 2 (two) times daily. Patient taking differently: Place 5 drops into both ears See admin instructions. Administer 5 drops into each ear once daily for 5 days as needed for cerumen impaction 11/26/21   Masters, Katie, DO  cephALEXin (KEFLEX) 500 MG capsule Take 1 capsule (500 mg total) by mouth 3 (three) times daily. 04/03/22   Drenda Freeze, MD  citalopram (CELEXA) 10 MG tablet Take 1 tablet (10 mg total) by mouth daily. Patient taking differently: Take 10 mg by mouth in the morning. 10/09/21   Suzzanne Cloud, NP  divalproex (DEPAKOTE SPRINKLE) 125 MG capsule Take 1 capsule (125 mg total) by mouth every 12 (twelve) hours. 03/06/22 04/05/22  Caren Griffins,  MD  donepezil (ARICEPT) 10 MG tablet Take 1 tablet (10 mg total) by mouth at bedtime. 10/09/21   Suzzanne Cloud, NP  ferrous gluconate (FERGON) 324 MG tablet Take 1 tablet (324 mg total) by mouth daily with breakfast. 03/07/22   Caren Griffins, MD  finasteride (PROSCAR) 5 MG tablet Take 1 tablet (5 mg total) by mouth daily. 03/08/22   Caren Griffins, MD  fluticasone (FLONASE) 50 MCG/ACT nasal spray Place 1 spray into both nostrils daily as needed for allergies.    [provider]  lidocaine (LIDODERM) 5 % Place 1 patch onto the skin daily. Remove & Discard patch within 12 hours or as directed by MD 03/07/22   Caren Griffins, MD  loratadine (CLARITIN) 10 MG tablet Take 1 tablet by mouth once daily Patient taking differently: Take 10 mg by mouth daily as needed for allergies (dizziness). 01/21/22   Masters, Katie, DO  LORazepam (ATIVAN) 0.5 MG tablet Take 1 tablet (0.5 mg total) by mouth every 6 (six) hours as needed (agitation, restlessness). 03/06/22   Caren Griffins, MD  memantine (NAMENDA) 10 MG tablet Take 1 tablet (10 mg total) by mouth 2 (two) times daily. 10/09/21   Suzzanne Cloud, NP  Multiple Vitamin (MULTIVITAMIN) tablet Take 1 tablet by mouth daily. Senior multivitamin.    [provider]  oxyCODONE (OXY IR/ROXICODONE) 5 MG immediate release tablet Take 1 tablet (5 mg total) by mouth every 4 (four)  hours as needed for severe pain or breakthrough pain. 03/06/22   Caren Griffins, MD  polyethylene glycol (MIRALAX / GLYCOLAX) 17 g packet Take 17 g by mouth daily. 11/26/21   Masters, Katie, DO  QUEtiapine (SEROQUEL) 25 MG tablet Take 1 tablet (25 mg total) by mouth at bedtime. 03/06/22   Caren Griffins, MD      Allergies    Flomax [tamsulosin] and Flonase [fluticasone propionate]    Review of Systems   Review of Systems  Skin:  Positive for wound.  All other systems reviewed and are negative.   Physical Exam Updated Vital Signs BP 131/61   Pulse 70   Temp  98.8 F (37.1 C) (Oral)   Resp 20   SpO2 97%  Physical Exam Vitals and nursing note reviewed.  Constitutional:      General: He is not in acute distress.    Appearance: Normal appearance. He is well-developed. He is not ill-appearing, toxic-appearing or diaphoretic.  HENT:     Head: Normocephalic and atraumatic.     Comments: No raccoon eyes or battles sign Eyes:     Extraocular Movements: Extraocular movements intact.     Conjunctiva/sclera: Conjunctivae normal.     Pupils: Pupils are equal, round, and reactive to light.  Cardiovascular:     Rate and Rhythm: Normal rate and regular rhythm.     Heart sounds: No murmur heard. Pulmonary:     Effort: Pulmonary effort is normal. No respiratory distress.     Breath sounds: Normal breath sounds. No stridor. No wheezing, rhonchi or rales.  Abdominal:     Palpations: Abdomen is soft.     Tenderness: There is no abdominal tenderness. There is no guarding.  Musculoskeletal:        General: No swelling. Normal range of motion.     Cervical back: Neck supple. No tenderness.     Right lower leg: No edema.     Left lower leg: No edema.     Comments: No midline C,T, or L spine tenderness. No L or R shoulder tenderness. No hip tenderness.   Skin:    General: Skin is warm and dry.     Capillary Refill: Capillary refill takes less than 2 seconds.     Comments: Abrasion to left temple with no obvious laceration or active bleeding  Neurological:     Mental Status: He is alert. Mental status is at baseline.     Comments: Oriented to self, at baseline  Psychiatric:        Mood and Affect: Mood normal.     ED Results / Procedures / Treatments   Labs (all labs ordered are listed, but only abnormal results are displayed) Labs Reviewed  COMPREHENSIVE METABOLIC PANEL - Abnormal; Notable for the following components:      Result Value   Calcium 8.3 (*)    Total Protein 5.4 (*)    Albumin 2.8 (*)    All other components within normal limits   CBC WITH DIFFERENTIAL/PLATELET - Abnormal; Notable for the following components:   RBC 3.53 (*)    Hemoglobin 11.4 (*)    HCT 34.6 (*)    Monocytes Absolute 1.1 (*)    All other components within normal limits  PROTIME-INR  URINALYSIS, ROUTINE W REFLEX MICROSCOPIC    EKG EKG Interpretation  Date/Time:  Monday April 14 2022 16:45:25 EST Ventricular Rate:  72 PR Interval:  166 QRS Duration: 86 QT Interval:  412 QTC Calculation: 451 R Axis:   -  2 Text Interpretation: Sinus rhythm with Premature atrial complexes Minimal voltage criteria for LVH, may be normal variant ( R in aVL ) Cannot rule out Anterior infarct , age undetermined Abnormal ECG When compared with ECG of 07-Mar-2022 05:54, PREVIOUS ECG IS PRESENT Confirmed by Regan Lemming ([id number]) on 04/14/2022 5:31:04 PM  Radiology CT Chest Wo Contrast  Result Date: 04/14/2022 CLINICAL DATA:  Unwitnessed fall, blunt chest trauma. EXAM: CT CHEST WITHOUT CONTRAST TECHNIQUE: Multidetector CT imaging of the chest was performed following the standard protocol without IV contrast. RADIATION DOSE REDUCTION: This exam was performed according to the departmental dose-optimization program which includes automated exposure control, adjustment of the mA and/or kV according to patient size and/or use of iterative reconstruction technique. COMPARISON:  03/29/2019 FINDINGS: Cardiovascular: Coronary, aortic arch, and branch vessel atherosclerotic vascular disease. Mediastinum/Nodes: Small to moderate-sized hiatal hernia. Thyroid nodules measuring up to 2.2 cm in diameter on image 29 series 2, already evaluated on prior thyroid ultrasound of 05/12/2019. This has been evaluated on previous imaging. (ref: J Am Coll Radiol. 2015 Feb;12(2): 143-50). Lungs/Pleura: No pulmonary contusion or pneumothorax. Mild dependent atelectasis in both lower lobes. 6 mm ground-glass density right middle lobe pulmonary nodule on image 74 series 5, stable from 2020. Stable 4 by 3  mm right middle lobe nodule on image 78 series 5. Upper Abdomen: Atherosclerosis. Musculoskeletal: Old healed right rib deformities from prior fractures. Subacute healing fractures of the left anterolateral third, fourth, and fifth ribs are present. Mild lower thoracic spondylosis. IMPRESSION: 1. Subacute healing fractures of the left anterolateral third, fourth, and fifth ribs. No pneumothorax or pulmonary contusion. 2. Small to moderate-sized hiatal hernia. 3. Stable 6 mm ground-glass density right middle lobe pulmonary nodule. This is been stable for 3 years. Follow up chest CT is recommended in 2 years time in order to document a full 5 years of stability and exclude low-grade adenocarcinoma. 4. Aortic atherosclerosis. Aortic Atherosclerosis (ICD10-I70.0). Electronically Signed   By: Van Clines M.D.   On: 04/14/2022 19:28   CT Head Wo Contrast  Result Date: 04/14/2022 CLINICAL DATA:  Head trauma, minor (Age >= 65y); Neck trauma (Age >= 65y) EXAM: CT HEAD WITHOUT CONTRAST CT CERVICAL SPINE WITHOUT CONTRAST TECHNIQUE: Multidetector CT imaging of the head and cervical spine was performed following the standard protocol without intravenous contrast. Multiplanar CT image reconstructions of the cervical spine were also generated. RADIATION DOSE REDUCTION: This exam was performed according to the departmental dose-optimization program which includes automated exposure control, adjustment of the mA and/or kV according to patient size and/or use of iterative reconstruction technique. COMPARISON:  CT head 03/04/2022 FINDINGS: CT HEAD FINDINGS Brain: Cerebral ventricle sizes are concordant with the degree of cerebral volume loss. Patchy and confluent areas of decreased attenuation are noted throughout the deep and periventricular white matter of the cerebral hemispheres bilaterally, compatible with chronic microvascular ischemic disease. No evidence of large-territorial acute infarction. No parenchymal  hemorrhage. No mass lesion. Interval development of an 11 mm hyperdense extra-axial fluid along the left calvarial convexity with associated fluid fluid level due to an underlying 1cm chronic left calvarial convexity subdural hematoma (6:16). Interval development of a 7 mm hyperdense extra-axial fluid along the right calvarial convexity with associated fluid fluid level due to an underlying 100m chronic right calvarial convexity (6:35). Associated 3 mm left-to-right midline shift. No hydrocephalus. Cavum septum pellucidum variant again noted. Basilar cisterns are patent. Vascular: No hyperdense vessel. Skull: No acute fracture or focal lesion. Sinuses/Orbits: Paranasal sinuses and mastoid air  cells are clear. The orbits are unremarkable. Other: None. CT CERVICAL SPINE FINDINGS Alignment: Grade 1 anterolisthesis of C4 on C5 and C7 on T1. Skull base and vertebrae: Multilevel moderate degenerative changes spine. No acute fracture. No aggressive appearing focal osseous lesion or focal pathologic process. Soft tissues and spinal canal: No prevertebral fluid or swelling. No visible canal hematoma. Upper chest: Unremarkable. Other: Atherosclerotic plaque of the carotid arteries within the neck. Atherosclerotic plaque. IMPRESSION: 1. Acute on chronic 11 mm left and 9 mm right subdural hematomas. Associated 3 mm left-to-right midline shift. 2. No acute displaced fracture or traumatic listhesis of the cervical spine. These results were called by telephone at the time of interpretation on 04/14/2022 at 5:30 pm to provider [facility] , who verbally acknowledged these results. Electronically Signed   By: Iven Finn M.D.   On: 04/14/2022 18:23   CT Cervical Spine Wo Contrast  Result Date: 04/14/2022 CLINICAL DATA:  Head trauma, minor (Age >= 65y); Neck trauma (Age >= 65y) EXAM: CT HEAD WITHOUT CONTRAST CT CERVICAL SPINE WITHOUT CONTRAST TECHNIQUE: Multidetector CT imaging of the head and cervical spine was  performed following the standard protocol without intravenous contrast. Multiplanar CT image reconstructions of the cervical spine were also generated. RADIATION DOSE REDUCTION: This exam was performed according to the departmental dose-optimization program which includes automated exposure control, adjustment of the mA and/or kV according to patient size and/or use of iterative reconstruction technique. COMPARISON:  CT head 03/04/2022 FINDINGS: CT HEAD FINDINGS Brain: Cerebral ventricle sizes are concordant with the degree of cerebral volume loss. Patchy and confluent areas of decreased attenuation are noted throughout the deep and periventricular white matter of the cerebral hemispheres bilaterally, compatible with chronic microvascular ischemic disease. No evidence of large-territorial acute infarction. No parenchymal hemorrhage. No mass lesion. Interval development of an 11 mm hyperdense extra-axial fluid along the left calvarial convexity with associated fluid fluid level due to an underlying 1cm chronic left calvarial convexity subdural hematoma (6:16). Interval development of a 7 mm hyperdense extra-axial fluid along the right calvarial convexity with associated fluid fluid level due to an underlying 71m chronic right calvarial convexity (6:35). Associated 3 mm left-to-right midline shift. No hydrocephalus. Cavum septum pellucidum variant again noted. Basilar cisterns are patent. Vascular: No hyperdense vessel. Skull: No acute fracture or focal lesion. Sinuses/Orbits: Paranasal sinuses and mastoid air cells are clear. The orbits are unremarkable. Other: None. CT CERVICAL SPINE FINDINGS Alignment: Grade 1 anterolisthesis of C4 on C5 and C7 on T1. Skull base and vertebrae: Multilevel moderate degenerative changes spine. No acute fracture. No aggressive appearing focal osseous lesion or focal pathologic process. Soft tissues and spinal canal: No prevertebral fluid or swelling. No visible canal hematoma. Upper  chest: Unremarkable. Other: Atherosclerotic plaque of the carotid arteries within the neck. Atherosclerotic plaque. IMPRESSION: 1. Acute on chronic 11 mm left and 9 mm right subdural hematomas. Associated 3 mm left-to-right midline shift. 2. No acute displaced fracture or traumatic listhesis of the cervical spine. These results were called by telephone at the time of interpretation on 04/14/2022 at 5:30 pm [facility] verbally acknowledged these results. Electronically Signed   By: MIven FinnM.D.   On: 04/14/2022 18:23   DG Shoulder Left  Result Date: 04/14/2022 CLINICAL DATA:  Fall, left shoulder pain EXAM: LEFT SHOULDER - 2+ VIEW COMPARISON:  Shoulder radiographs 03/03/2022 FINDINGS: There is no acute fracture or dislocation. Glenohumeral and acromioclavicular alignment is normal. There is mild degenerative change about  the shoulder. The soft tissues are unremarkable. There is an acute displaced fracture of the left third rib. IMPRESSION: 1. No acute fracture or dislocation in the shoulder. 2. Acute displaced fracture of the left third rib. Electronically Signed   By: Valetta Mole M.D.   On: 04/14/2022 17:46    Procedures Procedures    Medications Ordered in ED Medications - No data to display  ED Course/ Medical Decision Making/ A&P Clinical Course as of 04/14/22 2148  [facility] Apr 14, 2022  1639 Sindy Guadeloupe Antler [phone]), at bedside. States the facility says patient has had multiple falls recently and has been complaining of mild LLQ abdomina pain, L shoulder pain and dizziness. [AS]  3646 Received call from radiology. Acute on chronic SDH with 3 mm midline shift [AS]  8032 Spoke with neurosurgery Dr. Marcello Moores, who recommends follow up in clinic and repeat CT in 2 to 4 weeks if patient is at his baseline. [AS]  1857 CT Head Wo Contrast Acute on chronic SDH  [AS]  1858 DG Shoulder Left Acute displaced fracture of the left 3rd rib [AS]  1948 CT Chest Wo  Contrast Subacute fractures of left 3rd 4th and 5th ribs [AS]  2120 Spoke with Dr. Thermon Leyland trauma surgery who requests ED to ED transfer to [facility] for trauma evaluation [AS]  2121 Spoke with Dr. Alvino Chapel at [facility] ED who accepts transfer [AS]    Clinical Course User Index [AS] Claudie Fisherman Grafton Folk, PA-C                           Medical Decision Making Amount and/or Complexity of Data Reviewed Labs: ordered. Radiology: ordered. Decision-making details documented in ED Course.  This patient presents to the ED for concern of fall, this involves an extensive number of treatment options, and is a complaint that carries with it a high risk of complications and morbidity. The differential diagnosis of weakness includes but is not limited to neurologic causes (GBS, myasthenia gravis, CVA, MS, ALS, transverse myelitis, spinal cord injury, CVA, botulism, ) and other causes: ACS, Arrhythmia, syncope, orthostatic hypotension, sepsis, hypoglycemia, electrolyte disturbance, hypothyroidism, respiratory failure, symptomatic anemia, dehydration, heat injury, polypharmacy, malignancy.    Co morbidities that complicate the patient evaluation  dementia, frequent falls, recent SDH (03/03/2022), hyperlipidemia, previous TIA, BPH  My initial workup includes basic labs, urinalysis, CT head, c spine, XR left shoulder, EKG  Additional history obtained from: Nursing notes from this visit. Previous records within EMR system admission on 03/03/2022 for fall with SDH EMS provides a portion of the history  I ordered, reviewed and interpreted labs which include: CBC, CMP, urinalysis. Stable anemia of 11.4, hypocalcemia of 8.3, hypoalbuminemia of 2.8   I ordered imaging studies including CT head, c spine, R left shoulder I independently visualized and interpreted imaging which showed acute on chronic bilateral subdural hematomas with associated 3 mm midline shift. Subacute fractures of the left 3rd, 4th and  5th ribs. I agree with the radiologist interpretation  Consultations Obtained:  I requested consultation with the trauma surgery Dr. Chapman Moss,  and discussed lab and imaging findings as well as pertinent plan - they recommend: ED to ED transfer to [name] for trauma evaluation due to multisystem trauma.  I requested consultation with neurosurgery Dr. Marcello Moores who recommends outpatient follow up in 2 to 4 weeks for reevaluation of intracranial bleed.  Afebrile, hemodynamically stable.  88 year old male presents to the ED for evaluation of unwitnessed fall.  Patient has had Numerous falls recently and was admitted 03/03/2022 for subdural hematomas.  Was then discharged home and has had numerous falls since that time.  Today he fell forward and hit his head on the ground.  Denies loss of consciousness, however is a poor historian. Denies symptoms on my evaluation.  Physical exam was remarkable for an abrasion over the left eye and patient had no pain at the time of my evaluation.  Neurologic status is difficult to determine due to patient's dementia, but had no focal neurologic deficits.  Was reporting to the nursing home that he had left shoulder pain so a shoulder x-ray was obtained which did show displaced third rib fracture.  CT chest show fractures of the third fourth and fifth ribs.  Neurosurgery was consulted and recommend outpatient follow-up for acute on chronic subdural hematomas.  Trauma surgery was consulted for multisystem trauma and recommends ED to ED transfer for further trauma workup. Patient was accepted by Dr. Alvino Chapel. Will be transferred to [facility] ED. Stable at the time of transfer.  Patient's case discussed with Dr. Armandina Gemma who agrees with plan to transfer to [facility] ED for trauma evaluation.   Note: Portions of this report may have been transcribed using voice recognition software. Every effort was made to ensure accuracy; however, inadvertent computerized transcription errors may  still be present.          Final Clinical Impression(s) / ED Diagnoses Final diagnoses:  Fall, initial encounter  SDH (subdural hematoma) ([place])  Closed fracture of multiple ribs of left side, initial encounter    Rx / DC Orders ED Discharge Orders     None         Nehemiah Massed 04/14/22 2148    Regan Lemming, MD 04/16/22 0111

## 2022-04-15 DIAGNOSIS — S065XAA Traumatic subdural hemorrhage with loss of consciousness status unknown, initial encounter: Secondary | ICD-10-CM | POA: Diagnosis not present

## 2022-04-15 LAB — CBC
HCT: 34.8 % — ABNORMAL LOW (ref 39.0–52.0)
Hemoglobin: 12.2 g/dL — ABNORMAL LOW (ref 13.0–17.0)
MCH: 32.7 pg (ref 26.0–34.0)
MCHC: 35.1 g/dL (ref 30.0–36.0)
MCV: 93.3 fL (ref 80.0–100.0)
Platelets: 183 10*3/uL (ref 150–400)
RBC: 3.73 MIL/uL — ABNORMAL LOW (ref 4.22–5.81)
RDW: 13.5 % (ref 11.5–15.5)
WBC: 8.7 10*3/uL (ref 4.0–10.5)
nRBC: 0 % (ref 0.0–0.2)

## 2022-04-15 LAB — URINALYSIS, ROUTINE W REFLEX MICROSCOPIC
Bilirubin Urine: NEGATIVE
Glucose, UA: NEGATIVE mg/dL
Ketones, ur: NEGATIVE mg/dL
Nitrite: POSITIVE — AB
Protein, ur: 100 mg/dL — AB
RBC / HPF: >50 RBC/hpf — ABNORMAL HIGH (ref 0–5)
Specific Gravity, Urine: 1.008 (ref 1.005–1.030)
WBC, UA: >50 WBC/hpf — ABNORMAL HIGH (ref 0–5)
pH: 6 (ref 5.0–8.0)

## 2022-04-15 LAB — BASIC METABOLIC PANEL
Anion gap: 8 (ref 5–15)
BUN: 10 mg/dL (ref 8–23)
CO2: 22 mmol/L (ref 22–32)
Calcium: 8.3 mg/dL — ABNORMAL LOW (ref 8.9–10.3)
Chloride: 105 mmol/L (ref 98–111)
Creatinine, Ser: 0.86 mg/dL (ref 0.61–1.24)
GFR, Estimated: >60 mL/min (ref >60)
Glucose, Bld: 103 mg/dL — ABNORMAL HIGH (ref 70–99)
Potassium: 3.4 mmol/L — ABNORMAL LOW (ref 3.5–5.1)
Sodium: 135 mmol/L (ref 135–145)

## 2022-04-15 LAB — MAGNESIUM: Magnesium: 2.2 mg/dL (ref 1.7–2.4)

## 2022-04-15 LAB — MRSA NEXT GEN BY PCR, NASAL: MRSA by PCR Next Gen: DETECTED — AB

## 2022-04-15 MED ORDER — CHLORHEXIDINE GLUCONATE CLOTH 2 % EX PADS
6.0000 | MEDICATED_PAD | Freq: Every day | CUTANEOUS | Status: DC
  Administered 2022-04-15 – 2022-04-16 (×2): 6 via TOPICAL

## 2022-04-15 MED ORDER — CHLORHEXIDINE GLUCONATE CLOTH 2 % EX PADS: 6.0000 | MEDICATED_PAD | Freq: Every day | CUTANEOUS | Status: DC

## 2022-04-15 MED ORDER — POTASSIUM CHLORIDE 20 MEQ PO PACK
40.0000 meq | PACK | Freq: Once | ORAL | Status: AC
  Administered 2022-04-15: 40 meq via ORAL
  Filled 2022-04-15: qty 2

## 2022-04-15 MED ORDER — MUPIROCIN 2 % EX OINT
1.0000 | TOPICAL_OINTMENT | Freq: Two times a day (BID) | CUTANEOUS | Status: DC
  Administered 2022-04-15 – 2022-04-16 (×3): 1 via NASAL
  Filled 2022-04-15: qty 22

## 2022-04-15 NOTE — Progress Notes (Signed)
[facility] Progress Note     Subjective: CC:  Sitting up in bed, eating breakfast. Denies pain.  Patient is unsure of his location. Does not recall why he is in hospital. Oriented to self only.   Objective: Vital signs in last 24 hours: Temp:  [97.4 F (36.3 C)-98.8 F (37.1 C)] 97.4 F (36.3 C) (12/12 0755) Pulse Rate:  [69-79] 69 (12/12 0755) Resp:  [16-20] 17 (12/12 0755) BP: (117-148)/(52-111) 124/58 (12/12 0755) SpO2:  [97 %-99 %] 97 % (12/12 0755) Weight:  [66.2 kg] 66.2 kg (12/11 2238)    Intake/Output from previous day: No intake/output data recorded. Intake/Output this shift: No intake/output data recorded.  PE: Gen:  Alert, NAD, pleasant and cooperative HEENT: mile left periorbital ecchymosis  Card:  Regular rate and rhythm, pedal pulses 2+ BL Pulm:  Normal effort, clear to auscultation bilaterally Abd: Soft, non-tender, non-distended, no HSM Skin: warm and dry, no rashes  Psych: A&Ox1, appropriate mood and affect  Neuro: non focal exam, moving all extremities, following commands, appropriate speech, sensation to all extremities in tact.  Lab Results:  Recent Labs    04/14/22 1646 04/15/22 0238  WBC 8.9 8.7  HGB 11.4* 12.2*  HCT 34.6* 34.8*  PLT 177 183   BMET Recent Labs    04/14/22 1646 04/15/22 0238  NA 136 135  K 3.5 3.4*  CL 106 105  CO2 22 22  GLUCOSE 94 103*  BUN 13 10  CREATININE 0.95 0.86  CALCIUM 8.3* 8.3*   PT/INR Recent Labs    04/14/22 2049  LABPROT 14.3  INR 1.1   CMP     Component Value Date/Time   NA 135 04/15/2022 0238   K 3.4 (L) 04/15/2022 0238   CL 105 04/15/2022 0238   CO2 22 04/15/2022 0238   GLUCOSE 103 (H) 04/15/2022 0238   BUN 10 04/15/2022 0238   CREATININE 0.86 04/15/2022 0238   CALCIUM 8.3 (L) 04/15/2022 0238   PROT 5.4 (L) 04/14/2022 1646   ALBUMIN 2.8 (L) 04/14/2022 1646   AST 17 04/14/2022 1646   ALT 13 04/14/2022 1646   ALKPHOS 68 04/14/2022 1646   BILITOT 0.7 04/14/2022 1646    GFRNONAA >60 04/15/2022 0238   GFRAA >60 06/09/2019 2044   Lipase     Component Value Date/Time   LIPASE 36 03/19/2022 1222       Studies/Results: CT Chest Wo Contrast  Result Date: 04/14/2022 CLINICAL DATA:  Unwitnessed fall, blunt chest trauma. EXAM: CT CHEST WITHOUT CONTRAST TECHNIQUE: Multidetector CT imaging of the chest was performed following the standard protocol without IV contrast. RADIATION DOSE REDUCTION: This exam was performed according to the departmental dose-optimization program which includes automated exposure control, adjustment of the mA and/or kV according to patient size and/or use of iterative reconstruction technique. COMPARISON:  03/29/2019 FINDINGS: Cardiovascular: Coronary, aortic arch, and branch vessel atherosclerotic vascular disease. Mediastinum/Nodes: Small to moderate-sized hiatal hernia. Thyroid nodules measuring up to 2.2 cm in diameter on image 29 series 2, already evaluated on prior thyroid ultrasound of 05/12/2019. This has been evaluated on previous imaging. (ref: J Am Coll Radiol. 2015 Feb;12(2): 143-50). Lungs/Pleura: No pulmonary contusion or pneumothorax. Mild dependent atelectasis in both lower lobes. 6 mm ground-glass density right middle lobe pulmonary nodule on image 74 series 5, stable from 2020. Stable 4 by 3 mm right middle lobe nodule on image 78 series 5. Upper Abdomen: Atherosclerosis. Musculoskeletal: Old healed right rib deformities from prior fractures. Subacute healing fractures of the left  anterolateral third, fourth, and fifth ribs are present. Mild lower thoracic spondylosis. IMPRESSION: 1. Subacute healing fractures of the left anterolateral third, fourth, and fifth ribs. No pneumothorax or pulmonary contusion. 2. Small to moderate-sized hiatal hernia. 3. Stable 6 mm ground-glass density right middle lobe pulmonary nodule. This is been stable for 3 years. Follow up chest CT is recommended in 2 years time in order to document a full 5 years  of stability and exclude low-grade adenocarcinoma. 4. Aortic atherosclerosis. Aortic Atherosclerosis (ICD10-I70.0). Electronically Signed   By: Van Clines M.D.   On: 04/14/2022 19:28   CT Head Wo Contrast  Result Date: 04/14/2022 CLINICAL DATA:  Head trauma, minor (Age >= 65y); Neck trauma (Age >= 65y) EXAM: CT HEAD WITHOUT CONTRAST CT CERVICAL SPINE WITHOUT CONTRAST TECHNIQUE: Multidetector CT imaging of the head and cervical spine was performed following the standard protocol without intravenous contrast. Multiplanar CT image reconstructions of the cervical spine were also generated. RADIATION DOSE REDUCTION: This exam was performed according to the departmental dose-optimization program which includes automated exposure control, adjustment of the mA and/or kV according to patient size and/or use of iterative reconstruction technique. COMPARISON:  CT head 03/04/2022 FINDINGS: CT HEAD FINDINGS Brain: Cerebral ventricle sizes are concordant with the degree of cerebral volume loss. Patchy and confluent areas of decreased attenuation are noted throughout the deep and periventricular white matter of the cerebral hemispheres bilaterally, compatible with chronic microvascular ischemic disease. No evidence of large-territorial acute infarction. No parenchymal hemorrhage. No mass lesion. Interval development of an 11 mm hyperdense extra-axial fluid along the left calvarial convexity with associated fluid fluid level due to an underlying 1cm chronic left calvarial convexity subdural hematoma (6:16). Interval development of a 7 mm hyperdense extra-axial fluid along the right calvarial convexity with associated fluid fluid level due to an underlying 71m chronic right calvarial convexity (6:35). Associated 3 mm left-to-right midline shift. No hydrocephalus. Cavum septum pellucidum variant again noted. Basilar cisterns are patent. Vascular: No hyperdense vessel. Skull: No acute fracture or focal lesion.  Sinuses/Orbits: Paranasal sinuses and mastoid air cells are clear. The orbits are unremarkable. Other: None. CT CERVICAL SPINE FINDINGS Alignment: Grade 1 anterolisthesis of C4 on C5 and C7 on T1. Skull base and vertebrae: Multilevel moderate degenerative changes spine. No acute fracture. No aggressive appearing focal osseous lesion or focal pathologic process. Soft tissues and spinal canal: No prevertebral fluid or swelling. No visible canal hematoma. Upper chest: Unremarkable. Other: Atherosclerotic plaque of the carotid arteries within the neck. Atherosclerotic plaque. IMPRESSION: 1. Acute on chronic 11 mm left and 9 mm right subdural hematomas. Associated 3 mm left-to-right midline shift. 2. No acute displaced fracture or traumatic listhesis of the cervical spine. These results were called by telephone at the time of interpretation on 04/14/2022 at 5:30 pm to provider [facility], who verbally acknowledged these results. Electronically Signed   By: MIven FinnM.D.   On: 04/14/2022 18:23   CT Cervical Spine Wo Contrast  Result Date: 04/14/2022 CLINICAL DATA:  Head trauma, minor (Age >= 65y); Neck trauma (Age >= 65y) EXAM: CT HEAD WITHOUT CONTRAST CT CERVICAL SPINE WITHOUT CONTRAST TECHNIQUE: Multidetector CT imaging of the head and cervical spine was performed following the standard protocol without intravenous contrast. Multiplanar CT image reconstructions of the cervical spine were also generated. RADIATION DOSE REDUCTION: This exam was performed according to the departmental dose-optimization program which includes automated exposure control, adjustment of the mA and/or kV according to patient size and/or use of iterative  reconstruction technique. COMPARISON:  CT head 03/04/2022 FINDINGS: CT HEAD FINDINGS Brain: Cerebral ventricle sizes are concordant with the degree of cerebral volume loss. Patchy and confluent areas of decreased attenuation are noted throughout the deep and periventricular  white matter of the cerebral hemispheres bilaterally, compatible with chronic microvascular ischemic disease. No evidence of large-territorial acute infarction. No parenchymal hemorrhage. No mass lesion. Interval development of an 11 mm hyperdense extra-axial fluid along the left calvarial convexity with associated fluid fluid level due to an underlying 1cm chronic left calvarial convexity subdural hematoma (6:16). Interval development of a 7 mm hyperdense extra-axial fluid along the right calvarial convexity with associated fluid fluid level due to an underlying 68m chronic right calvarial convexity (6:35). Associated 3 mm left-to-right midline shift. No hydrocephalus. Cavum septum pellucidum variant again noted. Basilar cisterns are patent. Vascular: No hyperdense vessel. Skull: No acute fracture or focal lesion. Sinuses/Orbits: Paranasal sinuses and mastoid air cells are clear. The orbits are unremarkable. Other: None. CT CERVICAL SPINE FINDINGS Alignment: Grade 1 anterolisthesis of C4 on C5 and C7 on T1. Skull base and vertebrae: Multilevel moderate degenerative changes spine. No acute fracture. No aggressive appearing focal osseous lesion or focal pathologic process. Soft tissues and spinal canal: No prevertebral fluid or swelling. No visible canal hematoma. Upper chest: Unremarkable. Other: Atherosclerotic plaque of the carotid arteries within the neck. Atherosclerotic plaque. IMPRESSION: 1. Acute on chronic 11 mm left and 9 mm right subdural hematomas. Associated 3 mm left-to-right midline shift. 2. No acute displaced fracture or traumatic listhesis of the cervical spine. These results were called by telephone at the time of interpretation on 04/14/2022 at 5:30 pm [facility], who verbally acknowledged these results. Electronically Signed   By: MIven FinnM.D.   On: 04/14/2022 18:23   DG Shoulder Left  Result Date: 04/14/2022 CLINICAL DATA:  Fall, left shoulder pain EXAM: LEFT  SHOULDER - 2+ VIEW COMPARISON:  Shoulder radiographs 03/03/2022 FINDINGS: There is no acute fracture or dislocation. Glenohumeral and acromioclavicular alignment is normal. There is mild degenerative change about the shoulder. The soft tissues are unremarkable. There is an acute displaced fracture of the left third rib. IMPRESSION: 1. No acute fracture or dislocation in the shoulder. 2. Acute displaced fracture of the left third rib. Electronically Signed   By: PValetta MoleM.D.   On: 04/14/2022 17:46    Anti-infectives: Anti-infectives (From admission, onward)    None        Assessment/Plan  Acute on chronic 11 mm left and 9 mm right subdural hematomas with 363mleft to right midline shift -  Er provider discussed with Dr ThMarcello Mooreswho recommends outpatient follow up in 2 to 4 weeks for reevaluation of intracranial bleed."  Monitor neurostatus overnight   Healing subacute left 3,4,5 rib fractures - Pain is more right sided on exam, monitor   DNR     FEN - Reg VTE - Sequential Compression Devices ID - None given in the trauma bay.   Dispo - Med-Surg Floor, PT/OT/SLP evals Per H&P [facility]    LOS: 0 days   I reviewed nursing notes, last 24 h vitals and pain scores, last 48 h intake and output, last 24 h labs and trends, and last 24 h imaging results.    ElObie DredgePA-C [facility] Please see Amion for pager number during day hours 7:00am-4:30pm

## 2022-04-15 NOTE — Evaluation (Signed)
Physical Therapy Evaluation Patient Details Name: Jeffrey [name] MRN: [id number] DOB: 04-20-1929 Today's Date: 04/15/2022  History of Present Illness  88 yr old male admitted to the hospital 12/11 with fall at [facility] care involving falling head first after recent history of many falls. CT: Acute on chronic 11 mm left and 9 mm right subdural hematomas with 39m left to right midline shift. He was found previously to have a SDH, L hip hematoma, and L 3rd, 4th and 5th rib fractures. PMH: dementia, TIA, BPH  Clinical Impression  Pt was seen for initial mobility visit, and noted his balance changes that require RW and more direct assistance.  Pt is from ALF and could use HHPT to follow along, presuming staff of ALF are willing to provide extra support initially.  IF staff cannot do this would recommend him to SNF care, and will prepare for either possibility with goals of acute PT outlined below.       Recommendations for follow up therapy are one component of a multi-disciplinary discharge planning process, led by the attending physician.  Recommendations may be updated based on patient status, additional functional criteria and insurance authorization.  Follow Up Recommendations Home health PT      Assistance Recommended at Discharge Frequent or constant Supervision/Assistance  Patient can return home with the following  A little help with walking and/or transfers;A little help with bathing/dressing/bathroom;Assistance with cooking/housework;Direct supervision/assist for financial management;Direct supervision/assist for medications management;Assist for transportation    Equipment Recommendations Rolling walker (2 wheels)  Recommendations for Other Services       Functional Status Assessment Patient has had a recent decline in their functional status and demonstrates the ability to make significant improvements in function in a reasonable and predictable amount of time.      Precautions / Restrictions Precautions Precautions: Fall Precaution Comments: impulsive Restrictions Weight Bearing Restrictions: No      Mobility  Bed Mobility Overal bed mobility: Needs Assistance Bed Mobility: Supine to Sit, Sit to Supine     Supine to sit: Min assist Sit to supine: Min assist   General bed mobility comments: min assist with help for legs mainly    Transfers Overall transfer level: Needs assistance Equipment used: Rolling walker (2 wheels) Transfers: Sit to/from Stand Sit to Stand: Min assist           General transfer comment: min assist to support the effort to power up    Ambulation/Gait Ambulation/Gait assistance: Min guard, Min assist Gait Distance (Feet): 38 Feet Assistive device: Rolling walker (2 wheels), 1 person hand held assist Gait Pattern/deviations: Step-through pattern, Decreased stride length, Wide base of support, Knee flexed in stance - left, Knee flexed in stance - right Gait velocity: reduced Gait velocity interpretation: <1.31 ft/sec, indicative of household ambulator Pre-gait activities: posture is observed with cues for correction General Gait Details: pt is quite reliant on walker after using a SPC per his report, demonstrating fatigue symptoms and has unsafe tendency to walk outside the walker and turn with poor control  Stairs            Wheelchair Mobility    Modified Rankin (Stroke Patients Only)       Balance Overall balance assessment: Needs assistance Sitting-balance support: Feet supported Sitting balance-Leahy Scale: Fair     Standing balance support: Bilateral upper extremity supported, During functional activity Standing balance-Leahy Scale: Poor  Pertinent Vitals/Pain Pain Assessment Pain Assessment: No/denies pain    Home Living Family/patient expects to be discharged to:: Assisted living                 Home Equipment: Kasandra Knudsen - single  point Additional Comments: pt is from memory care - [facility]    Prior Function Prior Level of Function : Needs assist       Physical Assist : Mobility (physical) Mobility (physical): Gait   Mobility Comments: pt reports he used a SPC at times ADLs Comments: in ALF and has inconsistent history     Hand Dominance   Dominant Hand: Right    Extremity/Trunk Assessment   Upper Extremity Assessment Upper Extremity Assessment: Overall WFL for tasks assessed    Lower Extremity Assessment Lower Extremity Assessment: Generalized weakness    Cervical / Trunk Assessment Cervical / Trunk Assessment: Other exceptions (L side rib fractures)  Communication   Communication: No difficulties  Cognition Arousal/Alertness: Awake/alert Behavior During Therapy: Impulsive Overall Cognitive Status: No family/caregiver present to determine baseline cognitive functioning                                 General Comments: unclear if this is baseline        General Comments General comments (skin integrity, edema, etc.): Pt is up to side of bed with minimal support but cannot stand safely without help and requires assist to avoid lateral LOB during gait as well as corrections for unsafe turns    Exercises Other Exercises Other Exercises: 4 to 4+ LE strength   Assessment/Plan    PT Assessment Patient needs continued PT services  PT Problem List Decreased strength;Decreased activity tolerance;Decreased balance;Decreased mobility;Decreased coordination;Decreased knowledge of use of DME;Decreased safety awareness       PT Treatment Interventions DME instruction;Gait training;Functional mobility training;Therapeutic activities;Therapeutic exercise;Balance training;Neuromuscular re-education;Patient/family education    PT Goals (Current goals can be found in the Care Plan section)  Acute Rehab PT Goals Patient Stated Goal: to get stronger and feel better PT Goal  Formulation: With patient Time For Goal Achievement: 04/29/22 Potential to Achieve Goals: Good    Frequency Min 3X/week     Co-evaluation               AM-PAC PT "6 Clicks" Mobility  Outcome Measure Help needed turning from your back to your side while in a flat bed without using bedrails?: None Help needed moving from lying on your back to sitting on the side of a flat bed without using bedrails?: A Little Help needed moving to and from a bed to a chair (including a wheelchair)?: A Little Help needed standing up from a chair using your arms (e.g., wheelchair or bedside chair)?: A Little Help needed to walk in hospital room?: A Little Help needed climbing 3-5 steps with a railing? : A Lot 6 Click Score: 18    End of Session Equipment Utilized During Treatment: Gait belt Activity Tolerance: Patient tolerated treatment well;Patient limited by fatigue Patient left: in chair;with call bell/phone within reach;with chair alarm set Nurse Communication: Mobility status PT Visit Diagnosis: Unsteadiness on feet (R26.81);Muscle weakness (generalized) (M62.81);Difficulty in walking, not elsewhere classified (R26.2)    Time: 9937-1696 PT Time Calculation (min) (ACUTE ONLY): 24 min   Charges:   PT Evaluation $PT Eval Moderate Complexity: 1 Mod PT Treatments $Gait Training: 8-22 mins       Ramond Dial  04/15/2022, 3:19 PM  Mee Hives, PT PhD Acute Rehab Dept. Number: [place]

## 2022-04-15 NOTE — Evaluation (Signed)
Occupational Therapy Evaluation and Discharge Patient Details Name: Jeffrey [name] MRN: [id number] DOB: December 28, 1928 Today's Date: 04/15/2022   History of Present Illness 88 yr old male admitted to the hospital 12/11 with fall at [facility] care involving falling head first after recent history of many falls. CT: Acute on chronic 11 mm left and 9 mm right subdural hematomas with 62m left to right midline shift. He was found previously to have a SDH, L hip hematoma, and L 3rd, 4th and 5th rib fractures. PMH: dementia, TIA, BPH   Clinical Impression   This 88yo male admitted with above presents to acute OT at a min guard A level when up on his feet and S when in bed or sitting, unsure if this is his baseline from memory care or not so [facility] to his [facility]. No further OT needs, we will sign off.      Recommendations for follow up therapy are one component of a multi-disciplinary discharge planning process, led by the attending physician.  Recommendations may be updated based on patient status, additional functional criteria and insurance authorization.   Follow Up Recommendations  Home health OT     Assistance Recommended at Discharge Intermittent Supervision/Assistance (when up on his feet)  Patient can return home with the following A little help with bathing/dressing/bathroom;A little help with walking and/or transfers;Assistance with cooking/housework;Assist for transportation;Help with stairs or ramp for entrance;Direct supervision/assist for financial management;Direct supervision/assist for medications management    Functional Status Assessment  Patient has had a recent decline in their functional status and demonstrates the ability to make significant improvements in function in a reasonable and predictable amount of time.  Equipment Recommendations  None recommended by OT       Precautions / Restrictions  Precautions Precautions: Fall Precaution Comments: impulsive Restrictions Weight Bearing Restrictions: No      Mobility Bed Mobility               General bed mobility comments: overall at a S level due to recent history of falls    Transfers Overall transfer level: Needs assistance Equipment used: None Transfers: Sit to/from Stand Sit to Stand: Min guard           General transfer comment: ambulation in room with min guard A without AD      Balance Overall balance assessment: Mild deficits observed, not formally tested (when up on his feet)                                         ADL either performed or assessed with clinical judgement   ADL                                         General ADL Comments: Overall at a min guard A level when up on his feet due to recent frequent falls and S for cues prn due to memory deficits     Vision Patient Visual Report: No change from baseline              Pertinent Vitals/Pain Pain Assessment Pain Assessment: No/denies pain     Hand Dominance Right   Extremity/Trunk Assessment Upper Extremity Assessment Upper Extremity Assessment: [facility]  for tasks assessed      Communication Communication Communication: No difficulties   Cognition Arousal/Alertness: Awake/alert Behavior During Therapy: Impulsive Overall Cognitive Status: History of cognitive impairments - at baseline                                                  [place] expects to be discharged to:: Assisted living                             Home Equipment: Kasandra Knudsen - single point   Additional Comments: pt is from memory care - [facility]      Prior Functioning/Environment Prior Level of Function : Needs assist       Physical Assist : Mobility (physical) Mobility (physical): Gait   Mobility Comments: pt reports he used a SPC at times ADLs  Comments: memory care ALF ([facility]); appears from my eval he can do most of his ADLs with S/setup        OT Problem List: Impaired balance (sitting and/or standing);Decreased safety awareness         OT Goals(Current goals can be found in the care plan section) Acute Rehab OT Goals Patient Stated Goal: did not state         AM-PAC OT "6 Clicks" Daily Activity     Outcome Measure Help from another person eating meals?: None Help from another person taking care of personal grooming?: A Little Help from another person toileting, which includes using toliet, bedpan, or urinal?: A Little Help from another person bathing (including washing, rinsing, drying)?: A Little Help from another person to put on and taking off regular upper body clothing?: A Little Help from another person to put on and taking off regular lower body clothing?: A Little 6 Click Score: 19   End of Session Equipment Utilized During Treatment: Gait belt  Activity Tolerance: Patient tolerated treatment well Patient left: in bed;with call bell/phone within reach;with bed alarm set  OT Visit Diagnosis: Unsteadiness on feet (R26.81);Other abnormalities of gait and mobility (R26.89);History of falling (Z91.81);Other symptoms and signs involving cognitive function                Time: 7253-6644 OT Time Calculation (min): 12 min Charges:  OT General Charges $OT Visit: 1 Visit OT Evaluation $OT Eval Moderate Complexity: 1 Mod    Almon Register 04/15/2022, 4:32 PM

## 2022-04-15 NOTE — Progress Notes (Signed)
Jeffrey [name] is a 88 y.o. male patient admitted. Awake, alert - oriented  X 1 - pleasantly confused.  VSS - Blood pressure (!) 128/111, pulse 72, temperature 98.1 F (36.7 C), temperature source Oral, resp. rate 17, height '5\' 5"'$  (1.651 m), weight 66.2 kg, SpO2 99 %.    IV in place, occlusive dsg intact without redness.  Bed on lowest position. Call bell within reach. Floor mats in place.  Will cont to eval and treat per MD orders.  Vidal Schwalbe, RN 04/15/2022 2:39 AM

## 2022-04-16 DIAGNOSIS — R404 Transient alteration of awareness: Secondary | ICD-10-CM | POA: Diagnosis not present

## 2022-04-16 DIAGNOSIS — G459 Transient cerebral ischemic attack, unspecified: Secondary | ICD-10-CM | POA: Diagnosis not present

## 2022-04-16 DIAGNOSIS — S065XAA Traumatic subdural hemorrhage with loss of consciousness status unknown, initial encounter: Secondary | ICD-10-CM | POA: Diagnosis not present

## 2022-04-16 DIAGNOSIS — R4182 Altered mental status, unspecified: Secondary | ICD-10-CM | POA: Diagnosis not present

## 2022-04-16 DIAGNOSIS — Z7401 Bed confinement status: Secondary | ICD-10-CM | POA: Diagnosis not present

## 2022-04-16 DIAGNOSIS — Z743 Need for continuous supervision: Secondary | ICD-10-CM | POA: Diagnosis not present

## 2022-04-16 MED ORDER — AMOXICILLIN-POT CLAVULANATE 875-125 MG PO TABS
1.0000 | ORAL_TABLET | Freq: Two times a day (BID) | ORAL | Status: DC
  Administered 2022-04-16: 1 via ORAL
  Filled 2022-04-16 (×2): qty 1

## 2022-04-16 MED ORDER — MUPIROCIN 2 % EX OINT: 1.0000 | TOPICAL_OINTMENT | Freq: Two times a day (BID) | CUTANEOUS | 0 refills | Status: AC

## 2022-04-16 MED ORDER — SODIUM CHLORIDE 0.9 % IV SOLN: 2.0000 g | Freq: Once | INTRAVENOUS | Status: DC

## 2022-04-16 MED ORDER — CEFTRIAXONE SODIUM 1 G IJ SOLR: 2.0000 g | INTRAMUSCULAR | Status: DC

## 2022-04-16 MED ORDER — AMOXICILLIN-POT CLAVULANATE 875-125 MG PO TABS: 1.0000 | ORAL_TABLET | Freq: Two times a day (BID) | ORAL | 0 refills | Status: AC

## 2022-04-16 NOTE — Progress Notes (Signed)
RN attempted to call report no answer will try again

## 2022-04-16 NOTE — TOC Transition Note (Incomplete)
Transition of Care [facility]) - CM/SW Discharge Note   Patient Details  Name: Jeffrey [name] MRN: [id number] Date of Birth: 1928/09/18  [facility] CM/SW Contact:  Ella Bodo, RN Phone Number: 04/16/2022, 12:17 PM   Clinical Narrative:    88 yr old male admitted to the hospital 12/11 with fall at [facility] care involving falling head first after recent history of many falls. CT: Acute on chronic 11 mm left and 9 mm right subdural hematomas with 53m left to right midline shift. He was found previously to have a SDH, L hip hematoma, and L 3rd, 4th and 5th rib fractures.  [facility] two [facility], one at bedside. Spoke extensively with MNeena Rhymes legal guardian for patient:  he states that patient is demented, and is often agitated when he comes to the hospital or goes to doctor's appts.  He states that after the appt, he does not want to go back to the facility, and can become difficult to handle.  MRodman Keyis appreciative of care given to patient; we discussed need for non-emergent ambulance transport back to the facility today, and he is agreeable to this.    [facility].  He states no FL2 needed, as patient is observation.  Faxed discharge summary to [phone] per his request.  Bedside [phone]    Addendum: 1:48pm [facility]                  Discharge Plan and Services                                     Social Determinants of Health (SDOH) Interventions     Readmission Risk Interventions     No data to display         JReinaldo Raddle RN, BSN  Trauma/Neuro ICU Case Manager [phone]

## 2022-04-16 NOTE — Progress Notes (Signed)
PTAR showed up at bedside to take the patient back to the facility, EMS told the patient he is going to to [place] pt became agitated and stated that he wants to go home in [place] and RN told him yes that is where he is going to try and calm him down and get him to cooperate with EMS. EMS told RN that they are not able to tell the patient that because it is considered " Kidnapping" and that they can only tell him where he is actually going which is [place]. RN called Pts Guardian [name] and explained the situation to him and he spoke with EMS and I told him we would give the pt a moment and try again with him but pt was still agitated. PTAR had to leave RN gave the patient PO ativan to see if that would help him relax some, [name], CM and [place], [place] notified and aware of the situation.

## 2022-04-16 NOTE — Discharge Summary (Signed)
[facility] Discharge Summary   Patient ID: Jeffrey [name] MRN: [id number] DOB/AGE: 1928-05-30 88 y.o.  Admit date: 04/14/2022 Discharge date: 04/16/2022   Discharge Diagnosis Fall Acute on chronic 11 mm left and 9 mm right subdural hematomas with 59m left to right midline shift Healing subacute left 3,4,5 rib fractures Code status DNR  Consultants None  Imaging: CT Chest Wo Contrast  Result Date: 04/14/2022 CLINICAL DATA:  Unwitnessed fall, blunt chest trauma. EXAM: CT CHEST WITHOUT CONTRAST TECHNIQUE: Multidetector CT imaging of the chest was performed following the standard protocol without IV contrast. RADIATION DOSE REDUCTION: This exam was performed according to the departmental dose-optimization program which includes automated exposure control, adjustment of the mA and/or kV according to patient size and/or use of iterative reconstruction technique. COMPARISON:  03/29/2019 FINDINGS: Cardiovascular: Coronary, aortic arch, and branch vessel atherosclerotic vascular disease. Mediastinum/Nodes: Small to moderate-sized hiatal hernia. Thyroid nodules measuring up to 2.2 cm in diameter on image 29 series 2, already evaluated on prior thyroid ultrasound of 05/12/2019. This has been evaluated on previous imaging. (ref: J Am Coll Radiol. 2015 Feb;12(2): 143-50). Lungs/Pleura: No pulmonary contusion or pneumothorax. Mild dependent atelectasis in both lower lobes. 6 mm ground-glass density right middle lobe pulmonary nodule on image 74 series 5, stable from 2020. Stable 4 by 3 mm right middle lobe nodule on image 78 series 5. Upper Abdomen: Atherosclerosis. Musculoskeletal: Old healed right rib deformities from prior fractures. Subacute healing fractures of the left anterolateral third, fourth, and fifth ribs are present. Mild lower thoracic spondylosis. IMPRESSION: 1. Subacute healing fractures of the left anterolateral third, fourth, and fifth ribs. No pneumothorax or pulmonary  contusion. 2. Small to moderate-sized hiatal hernia. 3. Stable 6 mm ground-glass density right middle lobe pulmonary nodule. This is been stable for 3 years. Follow up chest CT is recommended in 2 years time in order to document a full 5 years of stability and exclude low-grade adenocarcinoma. 4. Aortic atherosclerosis. Aortic Atherosclerosis (ICD10-I70.0). Electronically Signed   By: WVan ClinesM.D.   On: 04/14/2022 19:28   CT Head Wo Contrast  Result Date: 04/14/2022 CLINICAL DATA:  Head trauma, minor (Age >= 65y); Neck trauma (Age >= 65y) EXAM: CT HEAD WITHOUT CONTRAST CT CERVICAL SPINE WITHOUT CONTRAST TECHNIQUE: Multidetector CT imaging of the head and cervical spine was performed following the standard protocol without intravenous contrast. Multiplanar CT image reconstructions of the cervical spine were also generated. RADIATION DOSE REDUCTION: This exam was performed according to the departmental dose-optimization program which includes automated exposure control, adjustment of the mA and/or kV according to patient size and/or use of iterative reconstruction technique. COMPARISON:  CT head 03/04/2022 FINDINGS: CT HEAD FINDINGS Brain: Cerebral ventricle sizes are concordant with the degree of cerebral volume loss. Patchy and confluent areas of decreased attenuation are noted throughout the deep and periventricular white matter of the cerebral hemispheres bilaterally, compatible with chronic microvascular ischemic disease. No evidence of large-territorial acute infarction. No parenchymal hemorrhage. No mass lesion. Interval development of an 11 mm hyperdense extra-axial fluid along the left calvarial convexity with associated fluid fluid level due to an underlying 1cm chronic left calvarial convexity subdural hematoma (6:16). Interval development of a 7 mm hyperdense extra-axial fluid along the right calvarial convexity with associated fluid fluid level due to an underlying 950mchronic right  calvarial convexity (6:35). Associated 3 mm left-to-right midline shift. No hydrocephalus. Cavum septum pellucidum variant again noted. Basilar cisterns are patent. Vascular: No hyperdense vessel. Skull: No acute fracture or  focal lesion. Sinuses/Orbits: Paranasal sinuses and mastoid air cells are clear. The orbits are unremarkable. Other: None. CT CERVICAL SPINE FINDINGS Alignment: Grade 1 anterolisthesis of C4 on C5 and C7 on T1. Skull base and vertebrae: Multilevel moderate degenerative changes spine. No acute fracture. No aggressive appearing focal osseous lesion or focal pathologic process. Soft tissues and spinal canal: No prevertebral fluid or swelling. No visible canal hematoma. Upper chest: Unremarkable. Other: Atherosclerotic plaque of the carotid arteries within the neck. Atherosclerotic plaque. IMPRESSION: 1. Acute on chronic 11 mm left and 9 mm right subdural hematomas. Associated 3 mm left-to-right midline shift. 2. No acute displaced fracture or traumatic listhesis of the cervical spine. These results were called by telephone at the time of interpretation on 04/14/2022 at 5:30 pm to provider [facility] , who verbally acknowledged these results. Electronically Signed   By: Iven Finn M.D.   On: 04/14/2022 18:23   CT Cervical Spine Wo Contrast  Result Date: 04/14/2022 CLINICAL DATA:  Head trauma, minor (Age >= 65y); Neck trauma (Age >= 65y) EXAM: CT HEAD WITHOUT CONTRAST CT CERVICAL SPINE WITHOUT CONTRAST TECHNIQUE: Multidetector CT imaging of the head and cervical spine was performed following the standard protocol without intravenous contrast. Multiplanar CT image reconstructions of the cervical spine were also generated. RADIATION DOSE REDUCTION: This exam was performed according to the departmental dose-optimization program which includes automated exposure control, adjustment of the mA and/or kV according to patient size and/or use of iterative reconstruction technique. COMPARISON:   CT head 03/04/2022 FINDINGS: CT HEAD FINDINGS Brain: Cerebral ventricle sizes are concordant with the degree of cerebral volume loss. Patchy and confluent areas of decreased attenuation are noted throughout the deep and periventricular white matter of the cerebral hemispheres bilaterally, compatible with chronic microvascular ischemic disease. No evidence of large-territorial acute infarction. No parenchymal hemorrhage. No mass lesion. Interval development of an 11 mm hyperdense extra-axial fluid along the left calvarial convexity with associated fluid fluid level due to an underlying 1cm chronic left calvarial convexity subdural hematoma (6:16). Interval development of a 7 mm hyperdense extra-axial fluid along the right calvarial convexity with associated fluid fluid level due to an underlying 68m chronic right calvarial convexity (6:35). Associated 3 mm left-to-right midline shift. No hydrocephalus. Cavum septum pellucidum variant again noted. Basilar cisterns are patent. Vascular: No hyperdense vessel. Skull: No acute fracture or focal lesion. Sinuses/Orbits: Paranasal sinuses and mastoid air cells are clear. The orbits are unremarkable. Other: None. CT CERVICAL SPINE FINDINGS Alignment: Grade 1 anterolisthesis of C4 on C5 and C7 on T1. Skull base and vertebrae: Multilevel moderate degenerative changes spine. No acute fracture. No aggressive appearing focal osseous lesion or focal pathologic process. Soft tissues and spinal canal: No prevertebral fluid or swelling. No visible canal hematoma. Upper chest: Unremarkable. Other: Atherosclerotic plaque of the carotid arteries within the neck. Atherosclerotic plaque. IMPRESSION: 1. Acute on chronic 11 mm left and 9 mm right subdural hematomas. Associated 3 mm left-to-right midline shift. 2. No acute displaced fracture or traumatic listhesis of the cervical spine. These results were called by telephone at the time of interpretation on 04/14/2022 at 5:30 pm to provider  [facility], who verbally acknowledged these results. Electronically Signed   By: MIven FinnM.D.   On: 04/14/2022 18:23   DG Shoulder Left  Result Date: 04/14/2022 CLINICAL DATA:  Fall, left shoulder pain EXAM: LEFT SHOULDER - 2+ VIEW COMPARISON:  Shoulder radiographs 03/03/2022 FINDINGS: There is no acute fracture or dislocation. Glenohumeral and acromioclavicular alignment  is normal. There is mild degenerative change about the shoulder. The soft tissues are unremarkable. There is an acute displaced fracture of the left third rib. IMPRESSION: 1. No acute fracture or dislocation in the shoulder. 2. Acute displaced fracture of the left third rib. Electronically Signed   By: Valetta Mole M.D.   On: 04/14/2022 17:46    Procedures None  Hospital Course:  EATHON VALADE is a 88 y.o. male PMH dementia, multiple falls, BPH with indwelling foley catheter, DNR who presented to ED 12/11 after unwitnessed fall at [facility] place. CT scans showed Acute on chronic 11 mm left and 9 mm right subdural hematomas with 54m left to right midline shift, and Healing left 3,4,5 rib fractures. CT head discussed with neurosurgery, Dr. TMarcello Moores who recommended outpatient follow up in 2 to 4 weeks for reevaluation of intracranial bleed, monitor neurostatus over night. Patient was admitted to the trauma service for observation. Patient worked with therapies during this admission who recommended home health PT/OT when medically stable for discharge. There was concern for UTI on urinalysis therefore foley catheter was exchanged on 04/16/22, urine culture sent, and patient discharged on 7 days of augmentin. On 04/16/22 the patient was felt stable for discharge back to his assisted living facility.  Patient will follow up as below and knows to call with questions or concerns.      Allergies as of 04/16/2022       Reactions   Flomax [tamsulosin] Other (See Comments)   Flushing  [facility] stiffness [facility]   Flonase [fluticasone Propionate] Other (See Comments)   Epistaxis [facility]       Medication List     STOP taking these medications    cephALEXin 500 MG capsule Commonly known as: KEFLEX   divalproex 125 MG capsule Commonly known as: DEPAKOTE SPRINKLE   lidocaine 5 % Commonly known as: LIDODERM   oxyCODONE 5 MG immediate release tablet Commonly known as: Oxy IR/ROXICODONE       TAKE these medications    acetaminophen 325 MG tablet Commonly known as: TYLENOL Take 650 mg by mouth every 6 (six) hours as needed for headache, fever or mild pain.   amoxicillin-clavulanate 875-125 MG tablet Commonly known as: AUGMENTIN Take 1 tablet by mouth every 12 (twelve) hours for 7 days.   CALCIUM 1000 + D PO Take 1 tablet by mouth daily.   citalopram 10 MG tablet Commonly known as: CELEXA Take 1 tablet (10 mg total) by mouth daily. What changed: when to take this   clonazePAM 0.5 MG tablet Commonly known as: KLONOPIN Take 0.25 mg by mouth at bedtime.   Debrox 6.5 % OTIC solution Generic drug: carbamide peroxide Place 5 drops into both ears 2 (two) times daily. What changed:  when to take this additional instructions   donepezil 10 MG tablet Commonly known as: ARICEPT Take 1 tablet (10 mg total) by mouth at bedtime.   ferrous gluconate 324 MG tablet Commonly known as: FERGON Take 1 tablet (324 mg total) by mouth daily with breakfast.   finasteride 5 MG tablet Commonly known as: PROSCAR Take 1 tablet (5 mg total) by mouth daily.   fluticasone 50 MCG/ACT nasal spray Commonly known as: FLONASE Place 1 spray into both nostrils daily as needed for allergies.   loratadine 10 MG tablet Commonly known as: CLARITIN Take 1 tablet by mouth once daily What changed:  how much to take when to take this reasons  to take this   LORazepam 0.5 MG tablet Commonly known as: ATIVAN Take 1 tablet (0.5 mg total) by mouth every 6 (six) hours as needed  (agitation, restlessness). What changed:  when to take this additional instructions   memantine 10 MG tablet Commonly known as: NAMENDA Take 1 tablet (10 mg total) by mouth 2 (two) times daily.   multivitamin tablet Take 1 tablet by mouth daily. Senior multivitamin.   mupirocin ointment 2 % Commonly known as: BACTROBAN Place 1 Application into the nose 2 (two) times daily for 5 days.   polyethylene glycol 17 g packet Commonly known as: MIRALAX / GLYCOLAX Take 17 g by mouth daily.   QUEtiapine 25 MG tablet Commonly known as: SEROQUEL Take 1 tablet (25 mg total) by mouth at bedtime. What changed:  how much to take when to take this          Follow-up Information     [facility] Follow up.   Why: Regaring urinary retention Contact information: [place]        Vallarie Mare, MD. Schedule an appointment as soon as possible for a visit in 2 week(s).   Specialty: Neurosurgery Why: Call for follow up in 2-4 weeks regarding head injury Contact information: [place] [address] [phone]         Primary care physician Follow up.   Why: Follow up with PCP in about 1 week                 Signed: Wellington Hampshire, [facility] 04/16/2022, 10:37 AM Please see Amion for pager number during day hours 7:00am-4:30pm

## 2022-04-16 NOTE — Care Management Obs Status (Signed)
[facility] NOTIFICATION   Patient Details  Name: Jeffrey [name] MRN: [id number] Date of Birth: 16-Sep-1928   Medicare Observation Status Notification Given:  Yes    Carles Collet, RN 04/16/2022, 10:35 AM

## 2022-04-16 NOTE — Progress Notes (Signed)
[facility] Progress Note     Subjective: CC-  Sitting up in bed eating breakfast. States that he slept well last night. Denies any pain or SOB.  Objective: Vital signs in last 24 hours: Temp:  [97.3 F (36.3 C)-98.4 F (36.9 C)] 98.4 F (36.9 C) (12/13 0728) Pulse Rate:  [73-82] 82 (12/13 0728) Resp:  [16-18] 16 (12/13 0728) BP: (114-138)/(52-60) 114/60 (12/13 0728) SpO2:  [93 %-100 %] 95 % (12/13 0728)    Intake/Output from previous day: 12/12 0701 - 12/13 0700 In: 60 [P.O.:60] Out: 700 [Urine:700] Intake/Output this shift: No intake/output data recorded.  PE: Gen:  Alert, NAD, pleasant and cooperative HEENT: mile left periorbital ecchymosis  Card:  RRR Pulm:  CTAB, rate and effort normal on room air Abd: Soft, non-tender, non-distended Skin: warm and dry, no rashes  Psych: A&Ox1, appropriate mood and affect  Neuro: non focal exam, moving all extremities, following commands, appropriate speech  Lab Results:  Recent Labs    04/14/22 1646 04/15/22 0238  WBC 8.9 8.7  HGB 11.4* 12.2*  HCT 34.6* 34.8*  PLT 177 183   BMET Recent Labs    04/14/22 1646 04/15/22 0238  NA 136 135  K 3.5 3.4*  CL 106 105  CO2 22 22  GLUCOSE 94 103*  BUN 13 10  CREATININE 0.95 0.86  CALCIUM 8.3* 8.3*   PT/INR Recent Labs    04/14/22 2049  LABPROT 14.3  INR 1.1   CMP     Component Value Date/Time   NA 135 04/15/2022 0238   K 3.4 (L) 04/15/2022 0238   CL 105 04/15/2022 0238   CO2 22 04/15/2022 0238   GLUCOSE 103 (H) 04/15/2022 0238   BUN 10 04/15/2022 0238   CREATININE 0.86 04/15/2022 0238   CALCIUM 8.3 (L) 04/15/2022 0238   PROT 5.4 (L) 04/14/2022 1646   ALBUMIN 2.8 (L) 04/14/2022 1646   AST 17 04/14/2022 1646   ALT 13 04/14/2022 1646   ALKPHOS 68 04/14/2022 1646   BILITOT 0.7 04/14/2022 1646   GFRNONAA >60 04/15/2022 0238   GFRAA >60 06/09/2019 2044   Lipase     Component Value Date/Time   LIPASE 36 03/19/2022 1222        Studies/Results: CT Chest Wo Contrast  Result Date: 04/14/2022 CLINICAL DATA:  Unwitnessed fall, blunt chest trauma. EXAM: CT CHEST WITHOUT CONTRAST TECHNIQUE: Multidetector CT imaging of the chest was performed following the standard protocol without IV contrast. RADIATION DOSE REDUCTION: This exam was performed according to the departmental dose-optimization program which includes automated exposure control, adjustment of the mA and/or kV according to patient size and/or use of iterative reconstruction technique. COMPARISON:  03/29/2019 FINDINGS: Cardiovascular: Coronary, aortic arch, and branch vessel atherosclerotic vascular disease. Mediastinum/Nodes: Small to moderate-sized hiatal hernia. Thyroid nodules measuring up to 2.2 cm in diameter on image 29 series 2, already evaluated on prior thyroid ultrasound of 05/12/2019. This has been evaluated on previous imaging. (ref: J Am Coll Radiol. 2015 Feb;12(2): 143-50). Lungs/Pleura: No pulmonary contusion or pneumothorax. Mild dependent atelectasis in both lower lobes. 6 mm ground-glass density right middle lobe pulmonary nodule on image 74 series 5, stable from 2020. Stable 4 by 3 mm right middle lobe nodule on image 78 series 5. Upper Abdomen: Atherosclerosis. Musculoskeletal: Old healed right rib deformities from prior fractures. Subacute healing fractures of the left anterolateral third, fourth, and fifth ribs are present. Mild lower thoracic spondylosis. IMPRESSION: 1. Subacute healing fractures of the left anterolateral third, fourth, and  fifth ribs. No pneumothorax or pulmonary contusion. 2. Small to moderate-sized hiatal hernia. 3. Stable 6 mm ground-glass density right middle lobe pulmonary nodule. This is been stable for 3 years. Follow up chest CT is recommended in 2 years time in order to document a full 5 years of stability and exclude low-grade adenocarcinoma. 4. Aortic atherosclerosis. Aortic Atherosclerosis (ICD10-I70.0).  Electronically Signed   By: Van Clines M.D.   On: 04/14/2022 19:28   CT Head Wo Contrast  Result Date: 04/14/2022 CLINICAL DATA:  Head trauma, minor (Age >= 65y); Neck trauma (Age >= 65y) EXAM: CT HEAD WITHOUT CONTRAST CT CERVICAL SPINE WITHOUT CONTRAST TECHNIQUE: Multidetector CT imaging of the head and cervical spine was performed following the standard protocol without intravenous contrast. Multiplanar CT image reconstructions of the cervical spine were also generated. RADIATION DOSE REDUCTION: This exam was performed according to the departmental dose-optimization program which includes automated exposure control, adjustment of the mA and/or kV according to patient size and/or use of iterative reconstruction technique. COMPARISON:  CT head 03/04/2022 FINDINGS: CT HEAD FINDINGS Brain: Cerebral ventricle sizes are concordant with the degree of cerebral volume loss. Patchy and confluent areas of decreased attenuation are noted throughout the deep and periventricular white matter of the cerebral hemispheres bilaterally, compatible with chronic microvascular ischemic disease. No evidence of large-territorial acute infarction. No parenchymal hemorrhage. No mass lesion. Interval development of an 11 mm hyperdense extra-axial fluid along the left calvarial convexity with associated fluid fluid level due to an underlying 1cm chronic left calvarial convexity subdural hematoma (6:16). Interval development of a 7 mm hyperdense extra-axial fluid along the right calvarial convexity with associated fluid fluid level due to an underlying 86m chronic right calvarial convexity (6:35). Associated 3 mm left-to-right midline shift. No hydrocephalus. Cavum septum pellucidum variant again noted. Basilar cisterns are patent. Vascular: No hyperdense vessel. Skull: No acute fracture or focal lesion. Sinuses/Orbits: Paranasal sinuses and mastoid air cells are clear. The orbits are unremarkable. Other: None. CT CERVICAL SPINE  FINDINGS Alignment: Grade 1 anterolisthesis of C4 on C5 and C7 on T1. Skull base and vertebrae: Multilevel moderate degenerative changes spine. No acute fracture. No aggressive appearing focal osseous lesion or focal pathologic process. Soft tissues and spinal canal: No prevertebral fluid or swelling. No visible canal hematoma. Upper chest: Unremarkable. Other: Atherosclerotic plaque of the carotid arteries within the neck. Atherosclerotic plaque. IMPRESSION: 1. Acute on chronic 11 mm left and 9 mm right subdural hematomas. Associated 3 mm left-to-right midline shift. 2. No acute displaced fracture or traumatic listhesis of the cervical spine. These results were called by telephone at the time of interpretation on 04/14/2022 at 5:30 pm to provider [facility], who verbally acknowledged these results. Electronically Signed   By: MIven FinnM.D.   On: 04/14/2022 18:23   CT Cervical Spine Wo Contrast  Result Date: 04/14/2022 CLINICAL DATA:  Head trauma, minor (Age >= 65y); Neck trauma (Age >= 65y) EXAM: CT HEAD WITHOUT CONTRAST CT CERVICAL SPINE WITHOUT CONTRAST TECHNIQUE: Multidetector CT imaging of the head and cervical spine was performed following the standard protocol without intravenous contrast. Multiplanar CT image reconstructions of the cervical spine were also generated. RADIATION DOSE REDUCTION: This exam was performed according to the departmental dose-optimization program which includes automated exposure control, adjustment of the mA and/or kV according to patient size and/or use of iterative reconstruction technique. COMPARISON:  CT head 03/04/2022 FINDINGS: CT HEAD FINDINGS Brain: Cerebral ventricle sizes are concordant with the degree of cerebral volume loss.  Patchy and confluent areas of decreased attenuation are noted throughout the deep and periventricular white matter of the cerebral hemispheres bilaterally, compatible with chronic microvascular ischemic disease. No evidence of  large-territorial acute infarction. No parenchymal hemorrhage. No mass lesion. Interval development of an 11 mm hyperdense extra-axial fluid along the left calvarial convexity with associated fluid fluid level due to an underlying 1cm chronic left calvarial convexity subdural hematoma (6:16). Interval development of a 7 mm hyperdense extra-axial fluid along the right calvarial convexity with associated fluid fluid level due to an underlying 39m chronic right calvarial convexity (6:35). Associated 3 mm left-to-right midline shift. No hydrocephalus. Cavum septum pellucidum variant again noted. Basilar cisterns are patent. Vascular: No hyperdense vessel. Skull: No acute fracture or focal lesion. Sinuses/Orbits: Paranasal sinuses and mastoid air cells are clear. The orbits are unremarkable. Other: None. CT CERVICAL SPINE FINDINGS Alignment: Grade 1 anterolisthesis of C4 on C5 and C7 on T1. Skull base and vertebrae: Multilevel moderate degenerative changes spine. No acute fracture. No aggressive appearing focal osseous lesion or focal pathologic process. Soft tissues and spinal canal: No prevertebral fluid or swelling. No visible canal hematoma. Upper chest: Unremarkable. Other: Atherosclerotic plaque of the carotid arteries within the neck. Atherosclerotic plaque. IMPRESSION: 1. Acute on chronic 11 mm left and 9 mm right subdural hematomas. Associated 3 mm left-to-right midline shift. 2. No acute displaced fracture or traumatic listhesis of the cervical spine. These results were called by telephone at the time of interpretation on 04/14/2022 at 5:30 pm to [facility], who verbally acknowledged these results. Electronically Signed   By: MIven FinnM.D.   On: 04/14/2022 18:23   DG Shoulder Left  Result Date: 04/14/2022 CLINICAL DATA:  Fall, left shoulder pain EXAM: LEFT SHOULDER - 2+ VIEW COMPARISON:  Shoulder radiographs 03/03/2022 FINDINGS: There is no acute fracture or dislocation. Glenohumeral  and acromioclavicular alignment is normal. There is mild degenerative change about the shoulder. The soft tissues are unremarkable. There is an acute displaced fracture of the left third rib. IMPRESSION: 1. No acute fracture or dislocation in the shoulder. 2. Acute displaced fracture of the left third rib. Electronically Signed   By: PValetta MoleM.D.   On: 04/14/2022 17:46    Anti-infectives: Anti-infectives (From admission, onward)    None        Assessment/Plan Fall Acute on chronic 11 mm left and 9 mm right subdural hematomas with 322mleft to right midline shift -  Er provider discussed with Dr ThMarcello Mooreswho recommends outpatient follow up in 2 to 4 weeks for reevaluation of intracranial bleed."  Monitor neuro status Healing subacute left 3,4,5 rib fractures - pulm toilet, patient currently without pain and not requiring pain medications Code status DNR     FEN - Reg VTE - SCDs, will discuss timing of DVT ppx with MD ID - None  Foley - indwelling, u/a with UTI vs colonization - will discuss with MD   Dispo - PT/OT rec HH therapies at ALF, vs SNF if ALF unable to provide extra support - will discuss with case management. SLP eval pending. [facility] H&P [facility]   I reviewed last 24 h vitals and pain scores, last 48 h intake and output, last 24 h labs and trends, and last 24 h imaging results.    LOS: 0 days    BrWellington HampshirePASurg[facility] 04/16/2022, 8:28 AM Please see Amion for  pager number during day hours 7:00am-4:30pm

## 2022-04-16 NOTE — Progress Notes (Signed)
Mobility Specialist - Progress Note   04/16/22 1400  Mobility  Activity Ambulated with assistance to bathroom  Level of Assistance Contact guard assist, steadying assist  Assistive Device Front wheel walker  Distance Ambulated (ft) 20 ft  Activity Response Tolerated well  $Mobility charge 1 Mobility    Pt received in bed requesting to use BR. No complaints throughout. Left in bed w/ call bell within reach and all needs met.   [place] Specialist Please contact via SecureChat or Rehab office at [phone]

## 2022-04-18 DIAGNOSIS — E785 Hyperlipidemia, unspecified: Secondary | ICD-10-CM | POA: Diagnosis not present

## 2022-04-18 DIAGNOSIS — N401 Enlarged prostate with lower urinary tract symptoms: Secondary | ICD-10-CM | POA: Diagnosis not present

## 2022-04-18 DIAGNOSIS — Z466 Encounter for fitting and adjustment of urinary device: Secondary | ICD-10-CM | POA: Diagnosis not present

## 2022-04-18 DIAGNOSIS — R338 Other retention of urine: Secondary | ICD-10-CM | POA: Diagnosis not present

## 2022-04-18 DIAGNOSIS — Z9181 History of falling: Secondary | ICD-10-CM | POA: Diagnosis not present

## 2022-04-18 DIAGNOSIS — F0390 Unspecified dementia without behavioral disturbance: Secondary | ICD-10-CM | POA: Diagnosis not present

## 2022-04-19 LAB — URINE CULTURE: Culture: >=100000 — AB
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
## 2022-04-23 ENCOUNTER — Ambulatory Visit: Payer: Medicare Other | Admitting: Neurology

## 2022-04-23 DIAGNOSIS — F32A Depression, unspecified: Secondary | ICD-10-CM | POA: Diagnosis not present

## 2022-04-23 DIAGNOSIS — E785 Hyperlipidemia, unspecified: Secondary | ICD-10-CM | POA: Diagnosis not present

## 2022-04-23 DIAGNOSIS — F03A0 Unspecified dementia, mild, without behavioral disturbance, psychotic disturbance, mood disturbance, and anxiety: Secondary | ICD-10-CM | POA: Diagnosis not present

## 2022-04-23 DIAGNOSIS — R339 Retention of urine, unspecified: Secondary | ICD-10-CM | POA: Diagnosis not present

## 2022-04-23 DIAGNOSIS — Z9181 History of falling: Secondary | ICD-10-CM | POA: Diagnosis not present

## 2022-04-23 DIAGNOSIS — Z960 Presence of urogenital implants: Secondary | ICD-10-CM | POA: Diagnosis not present

## 2022-04-23 DIAGNOSIS — F419 Anxiety disorder, unspecified: Secondary | ICD-10-CM | POA: Diagnosis not present

## 2022-04-23 DIAGNOSIS — F0390 Unspecified dementia without behavioral disturbance: Secondary | ICD-10-CM | POA: Diagnosis not present

## 2022-04-23 DIAGNOSIS — Z0000 Encounter for general adult medical examination without abnormal findings: Secondary | ICD-10-CM | POA: Diagnosis not present

## 2022-04-25 DIAGNOSIS — Z466 Encounter for fitting and adjustment of urinary device: Secondary | ICD-10-CM | POA: Diagnosis not present

## 2022-04-25 DIAGNOSIS — E871 Hypo-osmolality and hyponatremia: Secondary | ICD-10-CM | POA: Diagnosis not present

## 2022-04-25 DIAGNOSIS — R338 Other retention of urine: Secondary | ICD-10-CM | POA: Diagnosis not present

## 2022-04-25 DIAGNOSIS — E785 Hyperlipidemia, unspecified: Secondary | ICD-10-CM | POA: Diagnosis not present

## 2022-04-25 DIAGNOSIS — F0390 Unspecified dementia without behavioral disturbance: Secondary | ICD-10-CM | POA: Diagnosis not present

## 2022-04-25 DIAGNOSIS — R7402 Elevation of levels of lactic acid dehydrogenase (LDH): Secondary | ICD-10-CM | POA: Diagnosis not present

## 2022-04-25 DIAGNOSIS — Z9181 History of falling: Secondary | ICD-10-CM | POA: Diagnosis not present

## 2022-04-25 DIAGNOSIS — N401 Enlarged prostate with lower urinary tract symptoms: Secondary | ICD-10-CM | POA: Diagnosis not present

## 2022-05-04 IMAGING — DX DG CHEST 1V PORT
1 series · 1 of 1 positions shown · non-contrast
Comparison: 03/12/2020

CLINICAL DATA: COVID positive.

EXAM:
PORTABLE CHEST 1 VIEW

[chest ap]
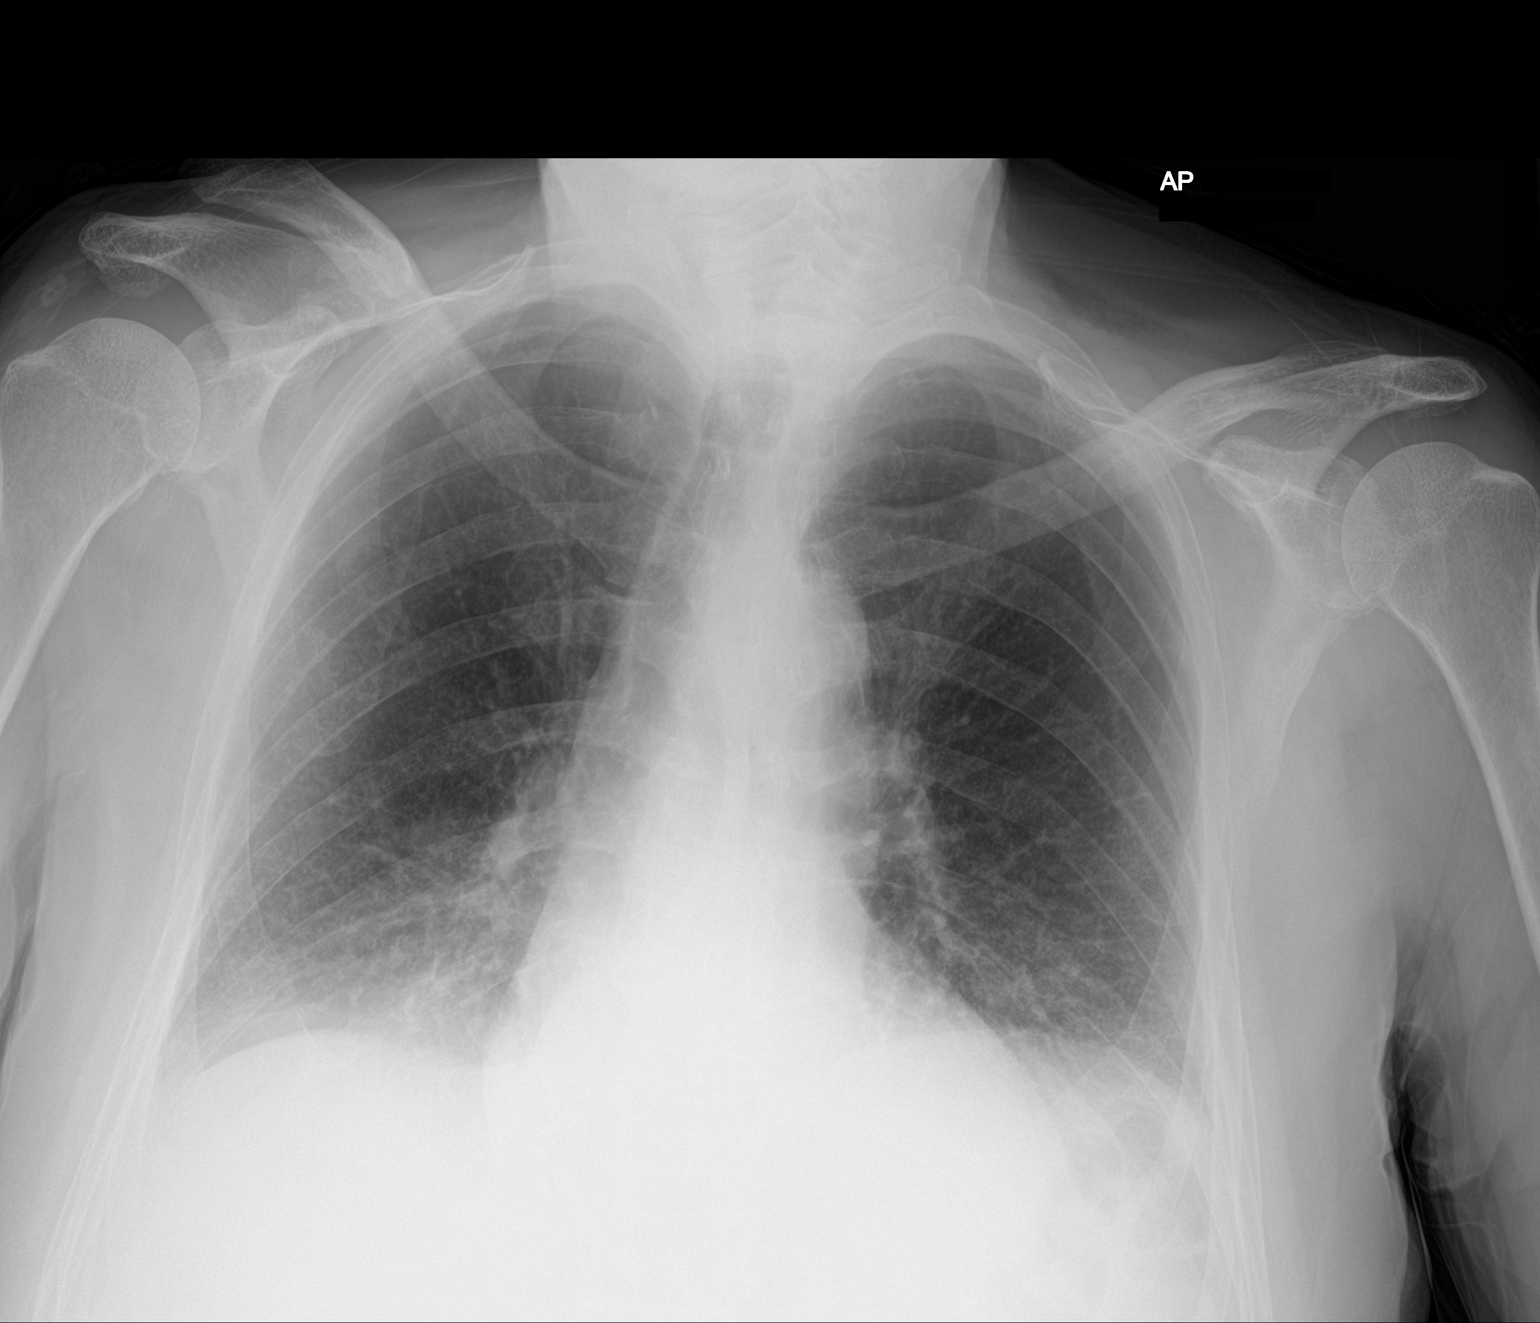

[1 of 1 positions shown; findings below may reference images not displayed]

FINDINGS: Lung volumes are low. Patchy heterogeneous bilateral airspace
opacities in a mid-lower lung zone predominant distribution. The
heart is normal in size with normal mediastinal contours. Small
retrocardiac hiatal hernia. No pneumothorax or large pleural
effusion. Chronic right AC joint separation. No acute osseous
abnormalities are seen.
IMPRESSION: Patchy heterogeneous bilateral airspace opacities in a mid-lower
lung zone predominant distribution, pattern consistent with [id number]
pneumonia.

## 2022-05-06 DIAGNOSIS — E785 Hyperlipidemia, unspecified: Secondary | ICD-10-CM | POA: Diagnosis not present

## 2022-05-06 DIAGNOSIS — Z87440 Personal history of urinary (tract) infections: Secondary | ICD-10-CM | POA: Diagnosis not present

## 2022-05-06 DIAGNOSIS — F0390 Unspecified dementia without behavioral disturbance: Secondary | ICD-10-CM | POA: Diagnosis not present

## 2022-05-06 DIAGNOSIS — Z466 Encounter for fitting and adjustment of urinary device: Secondary | ICD-10-CM | POA: Diagnosis not present

## 2022-05-06 DIAGNOSIS — N401 Enlarged prostate with lower urinary tract symptoms: Secondary | ICD-10-CM | POA: Diagnosis not present

## 2022-05-06 DIAGNOSIS — R338 Other retention of urine: Secondary | ICD-10-CM | POA: Diagnosis not present

## 2022-05-06 DIAGNOSIS — Z9181 History of falling: Secondary | ICD-10-CM | POA: Diagnosis not present

## 2022-05-07 DIAGNOSIS — E871 Hypo-osmolality and hyponatremia: Secondary | ICD-10-CM | POA: Diagnosis not present

## 2022-05-13 DIAGNOSIS — R338 Other retention of urine: Secondary | ICD-10-CM | POA: Diagnosis not present

## 2022-05-13 DIAGNOSIS — N401 Enlarged prostate with lower urinary tract symptoms: Secondary | ICD-10-CM | POA: Diagnosis not present

## 2022-05-13 DIAGNOSIS — Z466 Encounter for fitting and adjustment of urinary device: Secondary | ICD-10-CM | POA: Diagnosis not present

## 2022-05-13 DIAGNOSIS — F0390 Unspecified dementia without behavioral disturbance: Secondary | ICD-10-CM | POA: Diagnosis not present

## 2022-05-13 DIAGNOSIS — E785 Hyperlipidemia, unspecified: Secondary | ICD-10-CM | POA: Diagnosis not present

## 2022-05-13 DIAGNOSIS — Z9181 History of falling: Secondary | ICD-10-CM | POA: Diagnosis not present

## 2022-05-14 DIAGNOSIS — Z960 Presence of urogenital implants: Secondary | ICD-10-CM | POA: Diagnosis not present

## 2022-05-14 DIAGNOSIS — R339 Retention of urine, unspecified: Secondary | ICD-10-CM | POA: Diagnosis not present

## 2022-05-15 ENCOUNTER — Other Ambulatory Visit: Payer: Self-pay

## 2022-05-15 ENCOUNTER — Emergency Department (HOSPITAL_COMMUNITY)
Admission: EM | Admit: 2022-05-15 | Discharge: 2022-05-15 | Disposition: A | Discharge status: Skilled Nursing Facility | Payer: Medicare Other | Attending: Emergency Medicine | Admitting: Emergency Medicine

## 2022-05-15 DIAGNOSIS — R410 Disorientation, unspecified: Secondary | ICD-10-CM | POA: Diagnosis not present

## 2022-05-15 DIAGNOSIS — R509 Fever, unspecified: Secondary | ICD-10-CM | POA: Diagnosis not present

## 2022-05-15 DIAGNOSIS — Z743 Need for continuous supervision: Secondary | ICD-10-CM | POA: Diagnosis not present

## 2022-05-15 DIAGNOSIS — Y732 Prosthetic and other implants, materials and accessory gastroenterology and urology devices associated with adverse incidents: Secondary | ICD-10-CM | POA: Diagnosis not present

## 2022-05-15 DIAGNOSIS — R58 Hemorrhage, not elsewhere classified: Secondary | ICD-10-CM | POA: Diagnosis not present

## 2022-05-15 DIAGNOSIS — R404 Transient alteration of awareness: Secondary | ICD-10-CM | POA: Diagnosis not present

## 2022-05-15 DIAGNOSIS — T839XXA Unspecified complication of genitourinary prosthetic device, implant and graft, initial encounter: Secondary | ICD-10-CM

## 2022-05-15 DIAGNOSIS — T83091A Other mechanical complication of indwelling urethral catheter, initial encounter: Secondary | ICD-10-CM | POA: Insufficient documentation

## 2022-05-15 DIAGNOSIS — G4489 Other headache syndrome: Secondary | ICD-10-CM | POA: Diagnosis not present

## 2022-05-15 NOTE — ED Triage Notes (Signed)
Pt BIB EMS from [facility] s/o foley catheter removal. Per staff pt pulled out foley, bleeding reported. No active bleeding upon arrival. Pt denies symptoms, hx dementia.

## 2022-05-15 NOTE — Discharge Instructions (Signed)
Return for any problem.  ?

## 2022-05-15 NOTE — ED Provider Notes (Signed)
[facility] DEPT Provider Note   CSN: [id number] Arrival date & time: 05/15/22  1146     History  Chief Complaint  Patient presents with   Vascular Access Problem   Foley catheter problem    Jeffrey [name] is a 89 y.o. male.  89 year old male with prior medical history as detailed below presents for evaluation.  Patient transported by EMS from his facility.  Patient with chronic indwelling Foley.  Patient apparently inadvertently pulled out the Foley earlier today.  Per EMS, staff attempted to reinsert the Foley without deflating the balloon first.  This was unsuccessful.  Patient sent to the ED for Foley catheter placement.  Patient did have some scant bleeding from the penile meatus after removal of the catheter.  Patient is pleasantly demented.  He is without complaint.   The history is provided by the patient, medical records and the EMS personnel.       Home Medications Prior to Admission medications   Medication Sig Start Date End Date Taking? Authorizing Provider  acetaminophen (TYLENOL) 325 MG tablet Take 650 mg by mouth every 6 (six) hours as needed for headache, fever or mild pain.    [provider]  Calcium Carb-Cholecalciferol (CALCIUM 1000 + D PO) Take 1 tablet by mouth daily.    [provider]  carbamide peroxide (DEBROX) 6.5 % OTIC solution Place 5 drops into both ears 2 (two) times daily. Patient taking differently: Place 5 drops into both ears See admin instructions. Administer 5 drops into each ear once daily for 5 days as needed for cerumen impaction 11/26/21   Masters, Joellen [place], DO  citalopram (CELEXA) 10 MG tablet Take 1 tablet (10 mg total) by mouth daily. Patient taking differently: Take 10 mg by mouth in the morning. 10/09/21   Suzzanne Cloud, NP  clonazePAM (KLONOPIN) 0.5 MG tablet Take 0.25 mg by mouth at bedtime.    [provider]  donepezil (ARICEPT) 10 MG tablet Take 1 tablet (10 mg total)  by mouth at bedtime. Patient not taking: Reported on 04/15/2022 10/09/21   Suzzanne Cloud, NP  ferrous gluconate (FERGON) 324 MG tablet Take 1 tablet (324 mg total) by mouth daily with breakfast. 03/07/22   Caren Griffins, MD  finasteride (PROSCAR) 5 MG tablet Take 1 tablet (5 mg total) by mouth daily. 03/08/22   Caren Griffins, MD  fluticasone (FLONASE) 50 MCG/ACT nasal spray Place 1 spray into both nostrils daily as needed for allergies.    [provider]  loratadine (CLARITIN) 10 MG tablet Take 1 tablet by mouth once daily Patient taking differently: Take 10 mg by mouth daily as needed for allergies (dizziness). 01/21/22   Masters, Katie, DO  LORazepam (ATIVAN) 0.5 MG tablet Take 1 tablet (0.5 mg total) by mouth every 6 (six) hours as needed (agitation, restlessness). Patient taking differently: Take 0.5 mg by mouth See admin instructions. Give 1 tablet by mouth every 4 to 6 hours as needed for agitation and restlessness 03/06/22   Caren Griffins, MD  memantine (NAMENDA) 10 MG tablet Take 1 tablet (10 mg total) by mouth 2 (two) times daily. Patient not taking: Reported on 04/15/2022 10/09/21   Suzzanne Cloud, NP  Multiple Vitamin (MULTIVITAMIN) tablet Take 1 tablet by mouth daily. Senior multivitamin.    [provider]  polyethylene glycol (MIRALAX / GLYCOLAX) 17 g packet Take 17 g by mouth daily. 11/26/21   Masters, Katie, DO  QUEtiapine (SEROQUEL) 25 MG tablet  Take 1 tablet (25 mg total) by mouth at bedtime. Patient taking differently: Take 12.5 mg by mouth 2 (two) times daily. 03/06/22   Caren Griffins, MD      Allergies    Flomax [tamsulosin] and Flonase [fluticasone propionate]    Review of Systems   Review of Systems  All other systems reviewed and are negative.   Physical Exam Updated Vital Signs BP 127/72   Pulse 76   Temp 98 F (36.7 C)   Resp 18   Ht '5\' 5"'$  (1.651 m)   Wt 66 kg   SpO2 97%   BMI 24.21 kg/m  Physical Exam Vitals and nursing note  reviewed.  Constitutional:      General: He is not in acute distress.    Appearance: Normal appearance. He is well-developed.  HENT:     Head: Normocephalic and atraumatic.  Eyes:     Conjunctiva/sclera: Conjunctivae normal.     Pupils: Pupils are equal, round, and reactive to light.  Cardiovascular:     Rate and Rhythm: Normal rate and regular rhythm.     Heart sounds: Normal heart sounds.  Pulmonary:     Effort: Pulmonary effort is normal. No respiratory distress.     Breath sounds: Normal breath sounds.  Abdominal:     General: There is no distension.     Palpations: Abdomen is soft.     Tenderness: There is no abdominal tenderness.  Genitourinary:    Comments: Scant blood noted at penile meatus. Musculoskeletal:        General: No deformity. Normal range of motion.     Cervical back: Normal range of motion and neck supple.  Skin:    General: Skin is warm and dry.  Neurological:     General: No focal deficit present.     Mental Status: He is alert and oriented to person, place, and time.     ED Results / Procedures / Treatments   Labs (all labs ordered are listed, but only abnormal results are displayed) Labs Reviewed - No data to display  EKG None  Radiology No results found.  Procedures Procedures    Medications Ordered in ED Medications - No data to display  ED Course/ Medical Decision Making/ A&P                           Medical Decision Making   Medical Screen Complete  This patient presented to the ED with complaint of Foley catheter replacement.  This complaint involves an extensive number of treatment options. The initial differential diagnosis includes, but is not limited to, Foley catheter replacement  This presentation is: Acute, Chronic, Self-Limited, Previously Undiagnosed, Uncertain Prognosis, and Complicated  Patient with inadvertent removal of chronic Foley catheter.  Minimal bleeding noted at penile meatus.  Catheter replaced  here in the ED without difficulty.    Urine has cleared  -without apparent active hemorrhage  - after replacement of catheter.  Patient appears to be appropriate for discharge back to his facility.  Additional history obtained: External records from outside sources obtained and reviewed including prior ED visits and prior Inpatient records.   Problem List / ED Course:  Foley catheter replacement   Reevaluation:  After the interventions noted above, I reevaluated the patient and found that they have: improved  Disposition:  After consideration of the diagnostic results and the patients response to treatment, I feel that the patent would benefit from close outpatient  follow-up.          Final Clinical Impression(s) / ED Diagnoses Final diagnoses:  Foley catheter problem, initial encounter [facility])    Rx / DC Orders ED Discharge Orders     None         Valarie Merino, MD 05/15/22 1315

## 2022-05-15 NOTE — ED Notes (Addendum)
[facility] called, [facility] called to give report , no answer.

## 2022-05-16 ENCOUNTER — Encounter (HOSPITAL_COMMUNITY): Payer: Self-pay | Admitting: Emergency Medicine

## 2022-05-16 ENCOUNTER — Emergency Department (HOSPITAL_COMMUNITY)
Admission: EM | Admit: 2022-05-16 | Discharge: 2022-05-16 | Disposition: A | Discharge status: Skilled Nursing Facility | Payer: Medicare Other | Attending: Student | Admitting: Student

## 2022-05-16 DIAGNOSIS — Z743 Need for continuous supervision: Secondary | ICD-10-CM | POA: Diagnosis not present

## 2022-05-16 DIAGNOSIS — Z87891 Personal history of nicotine dependence: Secondary | ICD-10-CM | POA: Diagnosis not present

## 2022-05-16 DIAGNOSIS — T83098A Other mechanical complication of other indwelling urethral catheter, initial encounter: Secondary | ICD-10-CM | POA: Diagnosis not present

## 2022-05-16 DIAGNOSIS — Z79899 Other long term (current) drug therapy: Secondary | ICD-10-CM | POA: Diagnosis not present

## 2022-05-16 DIAGNOSIS — Z85828 Personal history of other malignant neoplasm of skin: Secondary | ICD-10-CM | POA: Insufficient documentation

## 2022-05-16 DIAGNOSIS — F0390 Unspecified dementia without behavioral disturbance: Secondary | ICD-10-CM | POA: Diagnosis not present

## 2022-05-16 DIAGNOSIS — Z960 Presence of urogenital implants: Secondary | ICD-10-CM | POA: Insufficient documentation

## 2022-05-16 DIAGNOSIS — R404 Transient alteration of awareness: Secondary | ICD-10-CM | POA: Diagnosis not present

## 2022-05-16 DIAGNOSIS — R609 Edema, unspecified: Secondary | ICD-10-CM | POA: Diagnosis not present

## 2022-05-16 DIAGNOSIS — I10 Essential (primary) hypertension: Secondary | ICD-10-CM | POA: Diagnosis not present

## 2022-05-16 DIAGNOSIS — R410 Disorientation, unspecified: Secondary | ICD-10-CM | POA: Diagnosis not present

## 2022-05-16 NOTE — ED Provider Notes (Signed)
[facility] DEPT Provider Note  CSN: [id number] Arrival date & time: 05/16/22 1128  Chief Complaint(s) No chief complaint on file.  HPI Jeffrey [name] is a 89 y.o. male who presents emergency department for evaluation of a urinary catheter complication.  The patient was seen yesterday after removing his Foley catheter and a new catheter was placed.  The patient reportedly had removed his catheter again today but on arrival to the emergency department, his urinary catheter is in place and actively draining urine on the bed.  Additional history unable to be obtained from the patient's secondary to underlying dementia.   Past Medical History Past Medical History:  Diagnosis Date   Adenomatous polyp of colon 1995   w/ HGD   Advance care planning 12/29/2013   Advance directive- Sister [place] designated patient were incapacitated.   Allergic rhinitis    Bilateral cold feet 03/07/2020   BPH (benign prostatic hyperplasia)    COLONIC POLYPS, HX OF 08/24/2006   Qualifier: Diagnosis of  By: Council Mechanic MD, Hilaria Ota    Diverticulosis 09/21/2000   DIVERTICULOSIS, COLON 09/21/2000   Qualifier: Diagnosis of  By: Council Mechanic MD, Hilaria Ota    Dizziness 03/07/2020   Dysfunction of eustachian tube 05/01/2011   ED (erectile dysfunction)    ED (erectile dysfunction) 11/14/2010   EXTERNAL HEMORRHOIDS 05/02/2010   Qualifier: Diagnosis of  By: Damita Dunnings MD, Phillip Heal     Hyperlipemia 05/05/1988   HYPERTROPHY PROSTATE W/UR OBST & OTH LUTS 10/09/2009   Pt uninterested in further testing.    Hypokalemia 05/11/2020   Hyponatremia 05/11/2020   Infected sebaceous cyst 01/25/2020   Knee pain 08/13/2011   Lung nodule 02/06/19   Medicare annual wellness visit, subsequent 12/22/2011   Memory change 01/03/2016   Memory loss    Nasal sinus congestion 03/08/2019   Paresthesia of foot 08/13/2011   Rectal bleeding 09/26/2019   Schamberg's purpura    2012- eval by  Dr. Allyson Sabal   Sebaceous cyst    Shoulder pain, right 08/24/2016   Skin cancer 05/05/1998   hx of Scalp-melanoma   TIA (transient ischemic attack)    Patient Active Problem List   Diagnosis Date Noted   DNR (do not resuscitate) 03/06/2022   Malnutrition of moderate degree 03/05/2022   Thyroid nodule 03/04/2022   Subdural hematoma ([place]) 03/03/2022   Rib fracture 03/03/2022   Anemia 03/03/2022   Elevated lactic acid level 03/03/2022   Healthcare maintenance 11/27/2021   Goals of care, counseling/discussion 11/12/2021   Dementia ([place]) 06/20/2020   Hypokalemia 05/11/2020   Chronic constipation 09-22-2019   Death of wife Feb 06, 2019   Cerumen impaction 03/14/2018   Esophageal stenosis 01/09/2017   Left ankle pain 11/05/2015   Allergic rhinitis 12/23/2012   History of melanoma 09/22/2011   Schamberg's purpura 08/01/2010   ELEVATED BLOOD PRESSURE WITHOUT DIAGNOSIS OF HYPERTENSION 10/03/2008   TRANSIENT ISCHEMIC ATTACK 08/24/2007   Home Medication(s) Prior to Admission medications   Medication Sig Start Date End Date Taking? Authorizing Provider  acetaminophen (TYLENOL) 325 MG tablet Take 650 mg by mouth every 6 (six) hours as needed for headache, fever or mild pain.    [provider]  Calcium Carb-Cholecalciferol (CALCIUM 1000 + D PO) Take 1 tablet by mouth daily.    [provider]  carbamide peroxide (DEBROX) 6.5 % OTIC solution Place 5 drops into both ears 2 (two) times daily. Patient taking differently: Place 5 drops into both ears See admin instructions. Administer 5 drops into  each ear once daily for 5 days as needed for cerumen impaction 11/26/21   Masters, Joellen Jersey, DO  citalopram (CELEXA) 10 MG tablet Take 1 tablet (10 mg total) by mouth daily. Patient taking differently: Take 10 mg by mouth in the morning. 10/09/21   Suzzanne Cloud, NP  clonazePAM (KLONOPIN) 0.5 MG tablet Take 0.25 mg by mouth at bedtime.    [provider]  donepezil (ARICEPT) 10 MG  tablet Take 1 tablet (10 mg total) by mouth at bedtime. Patient not taking: Reported on 04/15/2022 10/09/21   Suzzanne Cloud, NP  ferrous gluconate (FERGON) 324 MG tablet Take 1 tablet (324 mg total) by mouth daily with breakfast. 03/07/22   Caren Griffins, MD  finasteride (PROSCAR) 5 MG tablet Take 1 tablet (5 mg total) by mouth daily. 03/08/22   Caren Griffins, MD  fluticasone (FLONASE) 50 MCG/ACT nasal spray Place 1 spray into both nostrils daily as needed for allergies.    [provider]  loratadine (CLARITIN) 10 MG tablet Take 1 tablet by mouth once daily Patient taking differently: Take 10 mg by mouth daily as needed for allergies (dizziness). 01/21/22   Masters, Katie, DO  LORazepam (ATIVAN) 0.5 MG tablet Take 1 tablet (0.5 mg total) by mouth every 6 (six) hours as needed (agitation, restlessness). Patient taking differently: Take 0.5 mg by mouth See admin instructions. Give 1 tablet by mouth every 4 to 6 hours as needed for agitation and restlessness 03/06/22   Caren Griffins, MD  memantine (NAMENDA) 10 MG tablet Take 1 tablet (10 mg total) by mouth 2 (two) times daily. Patient not taking: Reported on 04/15/2022 10/09/21   Suzzanne Cloud, NP  Multiple Vitamin (MULTIVITAMIN) tablet Take 1 tablet by mouth daily. Senior multivitamin.    [provider]  polyethylene glycol (MIRALAX / GLYCOLAX) 17 g packet Take 17 g by mouth daily. 11/26/21   Masters, Katie, DO  QUEtiapine (SEROQUEL) 25 MG tablet Take 1 tablet (25 mg total) by mouth at bedtime. Patient taking differently: Take 12.5 mg by mouth 2 (two) times daily. 03/06/22   Caren Griffins, MD                                                                                                                                    Past Surgical History Past Surgical History:  Procedure Laterality Date   Carotid U/S Nml  09/08/2007   Colon polyp  2011   B9 Int Hemms   Colonoscopy polyps  10/05/2003   Divertics in hemms    COLONOSCOPY W/ POLYPECTOMY  08/1993; 09/21/2000;10/05/2003   B9   Echo (other)  09/08/2007   nml lvf EF 50-55% Mild Diast Dysfctn Triv Ar   melanoma scalp  07/2003   SEPTOPLASTY  1995   Skin revision vertex of scalp  2009   Dr. Luetta Nutting, [place]     Family History Family  History  Problem Relation Age of Onset   Rectal cancer Father    Colon cancer Paternal Aunt    Other Paternal Aunt        cancer on back of neck   Prostate cancer Neg Hx     Social History Social History   Tobacco Use   Smoking status: Former    Years: 5.00    Types: Cigarettes    Quit date: 05/05/1958    Years since quitting: 64.0   Smokeless tobacco: Never  Vaping Use   Vaping Use: Never used  Substance Use Topics   Alcohol use: No   Drug use: No   Allergies Flomax [tamsulosin] and Flonase [fluticasone propionate]  Review of Systems Review of Systems  Unable to perform ROS: Dementia    Physical Exam Vital Signs  I have reviewed the triage vital signs BP 131/61   Pulse 99   Temp 97.7 F (36.5 C) (Oral)   Resp 18   SpO2 98%   Physical Exam Constitutional:      General: He is not in acute distress.    Appearance: Normal appearance.  HENT:     Head: Normocephalic and atraumatic.     Nose: No congestion or rhinorrhea.  Eyes:     General:        Right eye: No discharge.        Left eye: No discharge.     Extraocular Movements: Extraocular movements intact.     Pupils: Pupils are equal, round, and reactive to light.  Cardiovascular:     Rate and Rhythm: Normal rate and regular rhythm.     Heart sounds: No murmur heard. Pulmonary:     Effort: No respiratory distress.     Breath sounds: No wheezing or rales.  Abdominal:     General: There is no distension.     Tenderness: There is no abdominal tenderness.  Musculoskeletal:        General: Normal range of motion.     Cervical back: Normal range of motion.  Skin:    General: Skin is warm and dry.  Neurological:     General: No  focal deficit present.     Mental Status: He is alert.     ED Results and Treatments Labs (all labs ordered are listed, but only abnormal results are displayed) Labs Reviewed - No data to display                                                                                                                        Radiology No results found.  Pertinent labs & imaging results that were available during my care of the patient were reviewed by me and considered in my medical decision making (see MDM for details).  Medications Ordered in ED Medications - No data to display  Procedures Ultrasound ED Abd  Date/Time: 05/16/2022 12:06 PM  Performed by: Teressa Lower, MD Authorized by: Teressa Lower, MD   Procedure details:    Indications comment:  Urinary catheter placement confirmation   Bladder:  Visualized    Images: not archived    Bladder findings:    Free pelvic fluid: not identified   Comments:     Patient urinary catheter balloon in place, confirmed with flushing    (including critical care time)  Medical Decision Making / ED Course   This patient presents to the ED for concern of urinary catheter complication, this involves an extensive number of treatment options, and is a complaint that carries with it a high risk of complications and morbidity.  The differential diagnosis includes urinary catheter bag removal, catheter displacement, catheter blockage  MDM: Patient seen emerged part for evaluation of urinary catheter dysfunction.  Physical exam is unremarkable and urinary catheter appears to be in place with scant bleeding around the urethral meatus likely secondary to traumatic removal yesterday.  Bedside ultrasound was performed that shows the urinary catheter balloon in the bladder lumen and this was confirmed by flushing the catheter  under ultrasound guidance.  Patient has return of clear yellow urine with flushing and the bag was replaced.  It appears that today's presentation was solely because of bag removal.  Patient then discharged back to facility.   Additional history obtained:  -External records from outside source obtained and reviewed including: Chart review including previous notes, labs, imaging, consultation notes    Medicines ordered and prescription drug management: No orders of the defined types were placed in this encounter.   -I have reviewed the patients home medicines and have made adjustments as needed  Critical interventions none    Cardiac Monitoring: The patient was maintained on a cardiac monitor.  I personally viewed and interpreted the cardiac monitored which showed an underlying rhythm of: NSR  Social Determinants of Health:  Factors impacting patients care include: none   Reevaluation: After the interventions noted above, I reevaluated the patient and found that they have :improved  Co morbidities that complicate the patient evaluation  Past Medical History:  Diagnosis Date   Adenomatous polyp of colon 1995   w/ HGD   Advance care planning 12/29/2013   Advance directive- Sister [place] designated patient were incapacitated.   Allergic rhinitis    Bilateral cold feet 03/07/2020   BPH (benign prostatic hyperplasia)    COLONIC POLYPS, HX OF 08/24/2006   Qualifier: Diagnosis of  By: Council Mechanic MD, Hilaria Ota    Diverticulosis 09/21/2000   DIVERTICULOSIS, COLON 09/21/2000   Qualifier: Diagnosis of  By: Council Mechanic MD, Hilaria Ota    Dizziness 03/07/2020   Dysfunction of eustachian tube 05/01/2011   ED (erectile dysfunction)    ED (erectile dysfunction) 11/14/2010   EXTERNAL HEMORRHOIDS 05/02/2010   Qualifier: Diagnosis of  By: Damita Dunnings MD, Phillip Heal     Hyperlipemia 05/05/1988   HYPERTROPHY PROSTATE W/UR OBST & OTH LUTS 10/09/2009   Pt uninterested in further  testing.    Hypokalemia 05/11/2020   Hyponatremia 05/11/2020   Infected sebaceous cyst 01/25/2020   Knee pain 08/13/2011   Lung nodule 02/03/2019   Medicare annual wellness visit, subsequent 12/22/2011   Memory change 01/03/2016   Memory loss    Nasal sinus congestion 03/08/2019   Paresthesia of foot 08/13/2011   Rectal bleeding 09/26/2019   Schamberg's purpura    2012- eval by Dr. Allyson Sabal   Sebaceous cyst  Shoulder pain, right 08/24/2016   Skin cancer 05/05/1998   hx of Scalp-melanoma   TIA (transient ischemic attack)       Dispostion: I considered admission for this patient, but he does not meet inpatient criteria for admission he is safe for discharge back to his facility     Final Clinical Impression(s) / ED Diagnoses Final diagnoses:  None     '@PCDICTATION'$ @    Teressa Lower, MD 05/16/22 1208

## 2022-05-16 NOTE — ED Triage Notes (Signed)
Pt here today back from nursing home ,with c/o pulled apart his  foley cath , the main cath is is still in pt , but bag is no where to be found , pants are wet

## 2022-05-16 NOTE — ED Notes (Signed)
Report called back to Nursing home , [facility] called for transport

## 2022-05-16 NOTE — Discharge Instructions (Signed)
This patient was seen in the emergency department for evaluation of a urinary catheter displacement.  The patient's urinary catheter is in place but the bag has been removed.  A new bag was placed today and patient will be sent back to your facility.

## 2022-05-16 NOTE — ED Notes (Signed)
Flushed Cath with no issue,pt tolerated well  , attached leg  bag to monitor drainage

## 2022-05-19 DIAGNOSIS — E785 Hyperlipidemia, unspecified: Secondary | ICD-10-CM | POA: Diagnosis not present

## 2022-05-19 DIAGNOSIS — Z9181 History of falling: Secondary | ICD-10-CM | POA: Diagnosis not present

## 2022-05-19 DIAGNOSIS — D649 Anemia, unspecified: Secondary | ICD-10-CM | POA: Diagnosis not present

## 2022-05-19 DIAGNOSIS — N401 Enlarged prostate with lower urinary tract symptoms: Secondary | ICD-10-CM | POA: Diagnosis not present

## 2022-05-19 DIAGNOSIS — F0390 Unspecified dementia without behavioral disturbance: Secondary | ICD-10-CM | POA: Diagnosis not present

## 2022-05-19 DIAGNOSIS — K5900 Constipation, unspecified: Secondary | ICD-10-CM | POA: Diagnosis not present

## 2022-05-19 DIAGNOSIS — Z466 Encounter for fitting and adjustment of urinary device: Secondary | ICD-10-CM | POA: Diagnosis not present

## 2022-05-19 DIAGNOSIS — R338 Other retention of urine: Secondary | ICD-10-CM | POA: Diagnosis not present

## 2022-05-21 DIAGNOSIS — Z960 Presence of urogenital implants: Secondary | ICD-10-CM | POA: Diagnosis not present

## 2022-05-21 DIAGNOSIS — R339 Retention of urine, unspecified: Secondary | ICD-10-CM | POA: Diagnosis not present

## 2022-05-23 ENCOUNTER — Emergency Department (HOSPITAL_COMMUNITY)
Admission: EM | Admit: 2022-05-23 | Discharge: 2022-05-23 | Disposition: A | Discharge status: Home / Self Care | Payer: Medicare Other | Attending: Student | Admitting: Student

## 2022-05-23 DIAGNOSIS — R6889 Other general symptoms and signs: Secondary | ICD-10-CM | POA: Diagnosis not present

## 2022-05-23 DIAGNOSIS — F0390 Unspecified dementia without behavioral disturbance: Secondary | ICD-10-CM | POA: Insufficient documentation

## 2022-05-23 DIAGNOSIS — Z85828 Personal history of other malignant neoplasm of skin: Secondary | ICD-10-CM | POA: Insufficient documentation

## 2022-05-23 DIAGNOSIS — R319 Hematuria, unspecified: Secondary | ICD-10-CM | POA: Diagnosis not present

## 2022-05-23 DIAGNOSIS — Z87891 Personal history of nicotine dependence: Secondary | ICD-10-CM | POA: Insufficient documentation

## 2022-05-23 DIAGNOSIS — Z743 Need for continuous supervision: Secondary | ICD-10-CM | POA: Diagnosis not present

## 2022-05-23 DIAGNOSIS — Z8673 Personal history of transient ischemic attack (TIA), and cerebral infarction without residual deficits: Secondary | ICD-10-CM | POA: Insufficient documentation

## 2022-05-23 DIAGNOSIS — Z7401 Bed confinement status: Secondary | ICD-10-CM | POA: Diagnosis not present

## 2022-05-23 DIAGNOSIS — R58 Hemorrhage, not elsewhere classified: Secondary | ICD-10-CM | POA: Diagnosis not present

## 2022-05-23 NOTE — ED Provider Notes (Signed)
[place] EMERGENCY DEPARTMENT AT [facility] Provider Note  CSN: [id number] Arrival date & time: 05/23/22 1404  Chief Complaint(s) Foley cather issue  HPI YAKUB LODES is a 89 y.o. male who presents emergency department for evaluation of a Foley catheter issue.  Facility staff states that they noticed a small amount of blood at the urethral meatus and in the bag and sent him to the emergency department for further evaluation.  I spoke directly with nursing home staff who states that despite offering nursing care, they are not allowed to "touch the Foley" and are not allowed to flush the Foley at his current facility.  Additional history from the patient is unable to be obtained secondary to his underlying dementia.,  Patient has a scant amount of bleeding at the urethral meatus but his urinary catheter bag is free-flowing with no gross hematuria.   Past Medical History Past Medical History:  Diagnosis Date   Adenomatous polyp of colon 1995   w/ HGD   Advance care planning 12/29/2013   Advance directive- Sister [place] designated patient were incapacitated.   Allergic rhinitis    Bilateral cold feet 03/07/2020   BPH (benign prostatic hyperplasia)    COLONIC POLYPS, HX OF 08/24/2006   Qualifier: Diagnosis of  By: Council Mechanic MD, Hilaria Ota    Diverticulosis 09/21/2000   DIVERTICULOSIS, COLON 09/21/2000   Qualifier: Diagnosis of  By: Council Mechanic MD, Hilaria Ota    Dizziness 03/07/2020   Dysfunction of eustachian tube 05/01/2011   ED (erectile dysfunction)    ED (erectile dysfunction) 11/14/2010   EXTERNAL HEMORRHOIDS 05/02/2010   Qualifier: Diagnosis of  By: Damita Dunnings MD, Phillip Heal     Hyperlipemia 05/05/1988   HYPERTROPHY PROSTATE W/UR OBST & OTH LUTS 10/09/2009   Pt uninterested in further testing.    Hypokalemia 05/11/2020   Hyponatremia 05/11/2020   Infected sebaceous cyst 01/25/2020   Knee pain 08/13/2011   Lung nodule 02/20/19   Medicare annual  wellness visit, subsequent 12/22/2011   Memory change 01/03/2016   Memory loss    Nasal sinus congestion 03/08/2019   Paresthesia of foot 08/13/2011   Rectal bleeding 09/26/2019   Schamberg's purpura    2012- eval by Dr. Allyson Sabal   Sebaceous cyst    Shoulder pain, right 08/24/2016   Skin cancer 05/05/1998   hx of Scalp-melanoma   TIA (transient ischemic attack)    Patient Active Problem List   Diagnosis Date Noted   DNR (do not resuscitate) 03/06/2022   Malnutrition of moderate degree 03/05/2022   Thyroid nodule 03/04/2022   Subdural hematoma ([place]) 03/03/2022   Rib fracture 03/03/2022   Anemia 03/03/2022   Elevated lactic acid level 03/03/2022   Healthcare maintenance 11/27/2021   Goals of care, counseling/discussion 11/12/2021   Dementia ([place]) 06/20/2020   Hypokalemia 05/11/2020   Chronic constipation 10-06-2019   Death of wife February 20, 2019   Cerumen impaction 03/14/2018   Esophageal stenosis 01/09/2017   Left ankle pain 11/05/2015   Allergic rhinitis 12/23/2012   History of melanoma 09/22/2011   Schamberg's purpura 08/01/2010   ELEVATED BLOOD PRESSURE WITHOUT DIAGNOSIS OF HYPERTENSION 10/03/2008   TRANSIENT ISCHEMIC ATTACK 08/24/2007   Home Medication(s) Prior to Admission medications   Medication Sig Start Date End Date Taking? Authorizing Provider  acetaminophen (TYLENOL) 325 MG tablet Take 650 mg by mouth every 6 (six) hours as needed for headache, fever or mild pain.    [provider]  Calcium Carb-Cholecalciferol (CALCIUM 1000 + D PO) Take 1  tablet by mouth daily.    [provider]  carbamide peroxide (DEBROX) 6.5 % OTIC solution Place 5 drops into both ears 2 (two) times daily. Patient taking differently: Place 5 drops into both ears See admin instructions. Administer 5 drops into each ear once daily for 5 days as needed for cerumen impaction 11/26/21   Masters, Joellen [place], DO  citalopram (CELEXA) 10 MG tablet Take 1 tablet (10 mg total) by mouth  daily. Patient taking differently: Take 10 mg by mouth in the morning. 10/09/21   Suzzanne Cloud, NP  clonazePAM (KLONOPIN) 0.5 MG tablet Take 0.25 mg by mouth at bedtime.    [provider]  donepezil (ARICEPT) 10 MG tablet Take 1 tablet (10 mg total) by mouth at bedtime. Patient not taking: Reported on 04/15/2022 10/09/21   Suzzanne Cloud, NP  ferrous gluconate (FERGON) 324 MG tablet Take 1 tablet (324 mg total) by mouth daily with breakfast. 03/07/22   Caren Griffins, MD  finasteride (PROSCAR) 5 MG tablet Take 1 tablet (5 mg total) by mouth daily. 03/08/22   Caren Griffins, MD  fluticasone (FLONASE) 50 MCG/ACT nasal spray Place 1 spray into both nostrils daily as needed for allergies.    [provider]  loratadine (CLARITIN) 10 MG tablet Take 1 tablet by mouth once daily Patient taking differently: Take 10 mg by mouth daily as needed for allergies (dizziness). 01/21/22   Masters, Katie, DO  LORazepam (ATIVAN) 0.5 MG tablet Take 1 tablet (0.5 mg total) by mouth every 6 (six) hours as needed (agitation, restlessness). Patient taking differently: Take 0.5 mg by mouth See admin instructions. Give 1 tablet by mouth every 4 to 6 hours as needed for agitation and restlessness 03/06/22   Caren Griffins, MD  memantine (NAMENDA) 10 MG tablet Take 1 tablet (10 mg total) by mouth 2 (two) times daily. Patient not taking: Reported on 04/15/2022 10/09/21   Suzzanne Cloud, NP  Multiple Vitamin (MULTIVITAMIN) tablet Take 1 tablet by mouth daily. Senior multivitamin.    [provider]  polyethylene glycol (MIRALAX / GLYCOLAX) 17 g packet Take 17 g by mouth daily. 11/26/21   Masters, Katie, DO  QUEtiapine (SEROQUEL) 25 MG tablet Take 1 tablet (25 mg total) by mouth at bedtime. Patient taking differently: Take 12.5 mg by mouth 2 (two) times daily. 03/06/22   Caren Griffins, MD                                                                                                                                     Past Surgical History Past Surgical History:  Procedure Laterality Date   Carotid U/S Nml  09/08/2007   Colon polyp  2011   B9 Int Hemms   Colonoscopy polyps  10/05/2003   Divertics in hemms   COLONOSCOPY W/ POLYPECTOMY  08/1993; 09/21/2000;10/05/2003   B9   Echo (other)  09/08/2007  nml lvf EF 50-55% Mild Diast Dysfctn Triv Ar   melanoma scalp  07/2003   SEPTOPLASTY  1995   Skin revision vertex of scalp  2009   Dr. Luetta Nutting, [place]     Family History Family History  Problem Relation Age of Onset   Rectal cancer Father    Colon cancer Paternal Aunt    Other Paternal Aunt        cancer on back of neck   Prostate cancer Neg Hx     Social History Social History   Tobacco Use   Smoking status: Former    Years: 5.00    Types: Cigarettes    Quit date: 05/05/1958    Years since quitting: 64.0   Smokeless tobacco: Never  Vaping Use   Vaping Use: Never used  Substance Use Topics   Alcohol use: No   Drug use: No   Allergies Flomax [tamsulosin] and Flonase [fluticasone propionate]  Review of Systems Review of Systems  Unable to perform ROS: Dementia    Physical Exam Vital Signs  I have reviewed the triage vital signs BP (!) 113/56 (BP Location: Right Arm)   Pulse 72   Temp (!) 97.2 F (36.2 C) (Oral)   Resp 19   SpO2 98% Comment: Simultaneous filing. User may not have seen previous data.  Physical Exam Constitutional:      General: He is not in acute distress.    Appearance: Normal appearance.  HENT:     Head: Normocephalic and atraumatic.     Nose: No congestion or rhinorrhea.  Eyes:     General:        Right eye: No discharge.        Left eye: No discharge.     Extraocular Movements: Extraocular movements intact.     Pupils: Pupils are equal, round, and reactive to light.  Cardiovascular:     Rate and Rhythm: Normal rate and regular rhythm.     Heart sounds: No murmur heard. Pulmonary:     Effort: No respiratory distress.      Breath sounds: No wheezing or rales.  Abdominal:     General: There is no distension.     Tenderness: There is no abdominal tenderness.  Genitourinary:    Comments: Scant bleeding at the urethral meatus Musculoskeletal:        General: Normal range of motion.     Cervical back: Normal range of motion.  Skin:    General: Skin is warm and dry.  Neurological:     General: No focal deficit present.     Mental Status: He is alert.     ED Results and Treatments Labs (all labs ordered are listed, but only abnormal results are displayed) Labs Reviewed - No data to display                                                                                                                        Radiology No  results found.  Pertinent labs & imaging results that were available during my care of the patient were reviewed by me and considered in my medical decision making (see MDM for details).  Medications Ordered in ED Medications - No data to display                                                                                                                                   Procedures Procedures  (including critical care time)  Medical Decision Making / ED Course   This patient presents to the ED for concern of hematuria, this involves an extensive number of treatment options, and is a complaint that carries with it a high risk of complications and morbidity.  The differential diagnosis includes trauma from Foley catheter, traumatic removal of Foley catheter, Foley catheter displacement, UTI  MDM: Patient seen emergency room for evaluation of hematuria.  Physical exam is quite unremarkable with only a very scant amount of bleeding seen at the urethral meatus.  Patient's urinary catheter flushes well with no evidence of obstruction and no gross hematuria.  He does not meet inpatient criteria for admission at this time and his Foley catheter is working well.  He will require outpatient  follow-up with his urologist and additional education for nursing staff at his facility.  I provided some instructions for nursing staff at the facility surrounding his hematuria to try and decrease frequent ER visits for this.  Patient then discharged   Additional history obtained: -Additional history obtained from facility staff -External records from outside source obtained and reviewed including: Chart review including previous notes, labs, imaging, consultation notes     Medicines ordered and prescription drug management: No orders of the defined types were placed in this encounter.   -I have reviewed the patients home medicines and have made adjustments as needed  Critical interventions none     Cardiac Monitoring: The patient was maintained on a cardiac monitor.  I personally viewed and interpreted the cardiac monitored which showed an underlying rhythm of: NSr  Social Determinants of Health:  Factors impacting patients care include: Currently living in facility    Reevaluation: After the interventions noted above, I reevaluated the patient and found that they have :improved  Co morbidities that complicate the patient evaluation  Past Medical History:  Diagnosis Date   Adenomatous polyp of colon 1995   w/ HGD   Advance care planning 12/29/2013   Advance directive- Sister [place] designated patient were incapacitated.   Allergic rhinitis    Bilateral cold feet 03/07/2020   BPH (benign prostatic hyperplasia)    COLONIC POLYPS, HX OF 08/24/2006   Qualifier: Diagnosis of  By: Council Mechanic MD, Hilaria Ota    Diverticulosis 09/21/2000   DIVERTICULOSIS, COLON 09/21/2000   Qualifier: Diagnosis of  By: Council Mechanic MD, Hilaria Ota    Dizziness 03/07/2020   Dysfunction of eustachian tube 05/01/2011   ED (erectile dysfunction)  ED (erectile dysfunction) 11/14/2010   EXTERNAL HEMORRHOIDS 05/02/2010   Qualifier: Diagnosis of  By: Damita Dunnings MD, Phillip Heal      Hyperlipemia 05/05/1988   HYPERTROPHY PROSTATE W/UR OBST & OTH LUTS 10/09/2009   Pt uninterested in further testing.    Hypokalemia 05/11/2020   Hyponatremia 05/11/2020   Infected sebaceous cyst 01/25/2020   Knee pain 08/13/2011   Lung nodule 02/03/2019   Medicare annual wellness visit, subsequent 12/22/2011   Memory change 01/03/2016   Memory loss    Nasal sinus congestion 03/08/2019   Paresthesia of foot 08/13/2011   Rectal bleeding 09/26/2019   Schamberg's purpura    2012- eval by Dr. Allyson Sabal   Sebaceous cyst    Shoulder pain, right 08/24/2016   Skin cancer 05/05/1998   hx of Scalp-melanoma   TIA (transient ischemic attack)       Dispostion: I considered admission for this patient, but he does not meet inpatient criteria for admission he is safe for discharge to outpatient follow-up     Final Clinical Impression(s) / ED Diagnoses Final diagnoses:  Hematuria, unspecified type     '@PCDICTATION'$ @    Teressa Lower, MD 05/23/22 1655

## 2022-05-23 NOTE — ED Triage Notes (Signed)
Pt BIBA from [facility]. EMS called for bleeding from urethra d/t catheter. No fresh bleeding on arrival to ED.  Pt Aox2 at baseline

## 2022-05-23 NOTE — ED Notes (Signed)
[facility] called. Pt placed on transpo list

## 2022-05-23 NOTE — Discharge Instructions (Signed)
This patient has been seen in the emergency department multiple times over the course of this month.  It is not unusual for patient with an indwelling Foley catheter to have intermittent blood around the urethral meatus or scant bleeding in the urinary Foley catheter bag.  If there are any concerns about the placement of the Foley catheter, the first step would be to take a 10 cc syringe and flush the urinary catheter.  If there is urine in the bag, the Foley catheter is in place and the patient does not need to come to the emergency department.  Please Have the patient follow-up outpatient with a urologist at [facility] urology.  I placed a referral for him today.  Please only have the patient return to the emergency department if he is having a significant amount of gross hematuria in his bag or large volume blood coming from the tip of the penis.

## 2022-05-26 DIAGNOSIS — E663 Overweight: Secondary | ICD-10-CM | POA: Diagnosis not present

## 2022-05-26 DIAGNOSIS — R451 Restlessness and agitation: Secondary | ICD-10-CM | POA: Diagnosis not present

## 2022-05-26 DIAGNOSIS — F03918 Unspecified dementia, unspecified severity, with other behavioral disturbance: Secondary | ICD-10-CM | POA: Diagnosis not present

## 2022-05-26 DIAGNOSIS — F419 Anxiety disorder, unspecified: Secondary | ICD-10-CM | POA: Diagnosis not present

## 2022-05-26 DIAGNOSIS — F331 Major depressive disorder, recurrent, moderate: Secondary | ICD-10-CM | POA: Diagnosis not present

## 2022-05-28 ENCOUNTER — Telehealth: Payer: Self-pay

## 2022-05-28 DIAGNOSIS — R339 Retention of urine, unspecified: Secondary | ICD-10-CM | POA: Diagnosis not present

## 2022-05-28 DIAGNOSIS — Z960 Presence of urogenital implants: Secondary | ICD-10-CM | POA: Diagnosis not present

## 2022-05-28 NOTE — Telephone Encounter (Signed)
        Patient  visited [place] on 1/19   Telephone encounter attempt : 1st   A HIPAA compliant voice message was left requesting a return call.  Instructed patient to call back   [place], [place] Management  [phone] [address], [place], [place] [address] Phone: [phone] Email: [email or web address]

## 2022-05-29 ENCOUNTER — Telehealth: Payer: Self-pay

## 2022-05-29 NOTE — Telephone Encounter (Signed)
     Patient  visit on 1/19  at [place] you been able to follow up with your primary care physician? Yes   The patient was or was not able to obtain any needed medicine or equipment. Yes   Are there diet recommendations that you are having difficulty following? Na   Patient expresses understanding of discharge instructions and education provided has no other needs at this time. Yes      [place], [facility], Care Management  [phone] [address], [place], [place] [address] Phone: [phone] Email: [email or web address]

## 2022-06-01 DIAGNOSIS — R82998 Other abnormal findings in urine: Secondary | ICD-10-CM | POA: Diagnosis not present

## 2022-06-04 DIAGNOSIS — F0392 Unspecified dementia, unspecified severity, with psychotic disturbance: Secondary | ICD-10-CM | POA: Diagnosis not present

## 2022-06-04 DIAGNOSIS — M4322 Fusion of spine, cervical region: Secondary | ICD-10-CM | POA: Diagnosis not present

## 2022-06-04 DIAGNOSIS — R278 Other lack of coordination: Secondary | ICD-10-CM | POA: Diagnosis not present

## 2022-06-04 DIAGNOSIS — R296 Repeated falls: Secondary | ICD-10-CM | POA: Diagnosis not present

## 2022-06-04 DIAGNOSIS — R41841 Cognitive communication deficit: Secondary | ICD-10-CM | POA: Diagnosis not present

## 2022-06-04 DIAGNOSIS — M6281 Muscle weakness (generalized): Secondary | ICD-10-CM | POA: Diagnosis not present

## 2022-06-04 DIAGNOSIS — R1310 Dysphagia, unspecified: Secondary | ICD-10-CM | POA: Diagnosis not present

## 2022-06-04 DIAGNOSIS — J449 Chronic obstructive pulmonary disease, unspecified: Secondary | ICD-10-CM | POA: Diagnosis not present

## 2022-06-04 DIAGNOSIS — R293 Abnormal posture: Secondary | ICD-10-CM | POA: Diagnosis not present

## 2022-06-05 DIAGNOSIS — R2689 Other abnormalities of gait and mobility: Secondary | ICD-10-CM | POA: Diagnosis not present

## 2022-06-05 DIAGNOSIS — F0392 Unspecified dementia, unspecified severity, with psychotic disturbance: Secondary | ICD-10-CM | POA: Diagnosis not present

## 2022-06-05 DIAGNOSIS — M6281 Muscle weakness (generalized): Secondary | ICD-10-CM | POA: Diagnosis not present

## 2022-06-05 DIAGNOSIS — J449 Chronic obstructive pulmonary disease, unspecified: Secondary | ICD-10-CM | POA: Diagnosis not present

## 2022-06-05 DIAGNOSIS — R1310 Dysphagia, unspecified: Secondary | ICD-10-CM | POA: Diagnosis not present

## 2022-06-05 DIAGNOSIS — R41841 Cognitive communication deficit: Secondary | ICD-10-CM | POA: Diagnosis not present

## 2022-06-05 DIAGNOSIS — R296 Repeated falls: Secondary | ICD-10-CM | POA: Diagnosis not present

## 2022-06-05 DIAGNOSIS — R278 Other lack of coordination: Secondary | ICD-10-CM | POA: Diagnosis not present

## 2022-06-05 DIAGNOSIS — R293 Abnormal posture: Secondary | ICD-10-CM | POA: Diagnosis not present

## 2022-06-05 DIAGNOSIS — M4322 Fusion of spine, cervical region: Secondary | ICD-10-CM | POA: Diagnosis not present

## 2022-06-06 DIAGNOSIS — N138 Other obstructive and reflux uropathy: Secondary | ICD-10-CM | POA: Diagnosis not present

## 2022-06-06 DIAGNOSIS — F419 Anxiety disorder, unspecified: Secondary | ICD-10-CM | POA: Diagnosis not present

## 2022-06-06 DIAGNOSIS — R278 Other lack of coordination: Secondary | ICD-10-CM | POA: Diagnosis not present

## 2022-06-06 DIAGNOSIS — R296 Repeated falls: Secondary | ICD-10-CM | POA: Diagnosis not present

## 2022-06-06 DIAGNOSIS — N401 Enlarged prostate with lower urinary tract symptoms: Secondary | ICD-10-CM | POA: Diagnosis not present

## 2022-06-06 DIAGNOSIS — Z9181 History of falling: Secondary | ICD-10-CM | POA: Diagnosis not present

## 2022-06-06 DIAGNOSIS — F32A Depression, unspecified: Secondary | ICD-10-CM | POA: Diagnosis not present

## 2022-06-06 DIAGNOSIS — K5909 Other constipation: Secondary | ICD-10-CM | POA: Diagnosis not present

## 2022-06-06 DIAGNOSIS — Z960 Presence of urogenital implants: Secondary | ICD-10-CM | POA: Diagnosis not present

## 2022-06-06 DIAGNOSIS — M6281 Muscle weakness (generalized): Secondary | ICD-10-CM | POA: Diagnosis not present

## 2022-06-06 DIAGNOSIS — J302 Other seasonal allergic rhinitis: Secondary | ICD-10-CM | POA: Diagnosis not present

## 2022-06-06 DIAGNOSIS — J449 Chronic obstructive pulmonary disease, unspecified: Secondary | ICD-10-CM | POA: Diagnosis not present

## 2022-06-06 DIAGNOSIS — F0392 Unspecified dementia, unspecified severity, with psychotic disturbance: Secondary | ICD-10-CM | POA: Diagnosis not present

## 2022-06-06 DIAGNOSIS — M4322 Fusion of spine, cervical region: Secondary | ICD-10-CM | POA: Diagnosis not present

## 2022-06-06 DIAGNOSIS — F0393 Unspecified dementia, unspecified severity, with mood disturbance: Secondary | ICD-10-CM | POA: Diagnosis not present

## 2022-06-07 DIAGNOSIS — M6281 Muscle weakness (generalized): Secondary | ICD-10-CM | POA: Diagnosis not present

## 2022-06-07 DIAGNOSIS — J449 Chronic obstructive pulmonary disease, unspecified: Secondary | ICD-10-CM | POA: Diagnosis not present

## 2022-06-07 DIAGNOSIS — M4322 Fusion of spine, cervical region: Secondary | ICD-10-CM | POA: Diagnosis not present

## 2022-06-07 DIAGNOSIS — F0392 Unspecified dementia, unspecified severity, with psychotic disturbance: Secondary | ICD-10-CM | POA: Diagnosis not present

## 2022-06-07 DIAGNOSIS — R296 Repeated falls: Secondary | ICD-10-CM | POA: Diagnosis not present

## 2022-06-07 DIAGNOSIS — R278 Other lack of coordination: Secondary | ICD-10-CM | POA: Diagnosis not present

## 2022-06-09 DIAGNOSIS — F0392 Unspecified dementia, unspecified severity, with psychotic disturbance: Secondary | ICD-10-CM | POA: Diagnosis not present

## 2022-06-09 DIAGNOSIS — R278 Other lack of coordination: Secondary | ICD-10-CM | POA: Diagnosis not present

## 2022-06-09 DIAGNOSIS — R296 Repeated falls: Secondary | ICD-10-CM | POA: Diagnosis not present

## 2022-06-09 DIAGNOSIS — M4322 Fusion of spine, cervical region: Secondary | ICD-10-CM | POA: Diagnosis not present

## 2022-06-09 DIAGNOSIS — M6281 Muscle weakness (generalized): Secondary | ICD-10-CM | POA: Diagnosis not present

## 2022-06-09 DIAGNOSIS — J449 Chronic obstructive pulmonary disease, unspecified: Secondary | ICD-10-CM | POA: Diagnosis not present

## 2022-06-10 DIAGNOSIS — J449 Chronic obstructive pulmonary disease, unspecified: Secondary | ICD-10-CM | POA: Diagnosis not present

## 2022-06-10 DIAGNOSIS — R296 Repeated falls: Secondary | ICD-10-CM | POA: Diagnosis not present

## 2022-06-10 DIAGNOSIS — E119 Type 2 diabetes mellitus without complications: Secondary | ICD-10-CM | POA: Diagnosis not present

## 2022-06-10 DIAGNOSIS — R278 Other lack of coordination: Secondary | ICD-10-CM | POA: Diagnosis not present

## 2022-06-10 DIAGNOSIS — M4322 Fusion of spine, cervical region: Secondary | ICD-10-CM | POA: Diagnosis not present

## 2022-06-10 DIAGNOSIS — F0392 Unspecified dementia, unspecified severity, with psychotic disturbance: Secondary | ICD-10-CM | POA: Diagnosis not present

## 2022-06-10 DIAGNOSIS — M6281 Muscle weakness (generalized): Secondary | ICD-10-CM | POA: Diagnosis not present

## 2022-06-11 DIAGNOSIS — R278 Other lack of coordination: Secondary | ICD-10-CM | POA: Diagnosis not present

## 2022-06-11 DIAGNOSIS — F0392 Unspecified dementia, unspecified severity, with psychotic disturbance: Secondary | ICD-10-CM | POA: Diagnosis not present

## 2022-06-11 DIAGNOSIS — M4322 Fusion of spine, cervical region: Secondary | ICD-10-CM | POA: Diagnosis not present

## 2022-06-11 DIAGNOSIS — M6281 Muscle weakness (generalized): Secondary | ICD-10-CM | POA: Diagnosis not present

## 2022-06-11 DIAGNOSIS — J449 Chronic obstructive pulmonary disease, unspecified: Secondary | ICD-10-CM | POA: Diagnosis not present

## 2022-06-11 DIAGNOSIS — R296 Repeated falls: Secondary | ICD-10-CM | POA: Diagnosis not present

## 2022-06-12 DIAGNOSIS — F0392 Unspecified dementia, unspecified severity, with psychotic disturbance: Secondary | ICD-10-CM | POA: Diagnosis not present

## 2022-06-12 DIAGNOSIS — R296 Repeated falls: Secondary | ICD-10-CM | POA: Diagnosis not present

## 2022-06-12 DIAGNOSIS — R278 Other lack of coordination: Secondary | ICD-10-CM | POA: Diagnosis not present

## 2022-06-12 DIAGNOSIS — M6281 Muscle weakness (generalized): Secondary | ICD-10-CM | POA: Diagnosis not present

## 2022-06-12 DIAGNOSIS — M4322 Fusion of spine, cervical region: Secondary | ICD-10-CM | POA: Diagnosis not present

## 2022-06-12 DIAGNOSIS — J449 Chronic obstructive pulmonary disease, unspecified: Secondary | ICD-10-CM | POA: Diagnosis not present

## 2022-06-13 DIAGNOSIS — L259 Unspecified contact dermatitis, unspecified cause: Secondary | ICD-10-CM | POA: Diagnosis not present

## 2022-06-13 DIAGNOSIS — R278 Other lack of coordination: Secondary | ICD-10-CM | POA: Diagnosis not present

## 2022-06-13 DIAGNOSIS — J449 Chronic obstructive pulmonary disease, unspecified: Secondary | ICD-10-CM | POA: Diagnosis not present

## 2022-06-13 DIAGNOSIS — D509 Iron deficiency anemia, unspecified: Secondary | ICD-10-CM | POA: Diagnosis not present

## 2022-06-13 DIAGNOSIS — M6281 Muscle weakness (generalized): Secondary | ICD-10-CM | POA: Diagnosis not present

## 2022-06-13 DIAGNOSIS — F0392 Unspecified dementia, unspecified severity, with psychotic disturbance: Secondary | ICD-10-CM | POA: Diagnosis not present

## 2022-06-13 DIAGNOSIS — R296 Repeated falls: Secondary | ICD-10-CM | POA: Diagnosis not present

## 2022-06-13 DIAGNOSIS — M4322 Fusion of spine, cervical region: Secondary | ICD-10-CM | POA: Diagnosis not present

## 2022-06-16 DIAGNOSIS — R296 Repeated falls: Secondary | ICD-10-CM | POA: Diagnosis not present

## 2022-06-16 DIAGNOSIS — R278 Other lack of coordination: Secondary | ICD-10-CM | POA: Diagnosis not present

## 2022-06-16 DIAGNOSIS — J449 Chronic obstructive pulmonary disease, unspecified: Secondary | ICD-10-CM | POA: Diagnosis not present

## 2022-06-16 DIAGNOSIS — M4322 Fusion of spine, cervical region: Secondary | ICD-10-CM | POA: Diagnosis not present

## 2022-06-16 DIAGNOSIS — M6281 Muscle weakness (generalized): Secondary | ICD-10-CM | POA: Diagnosis not present

## 2022-06-16 DIAGNOSIS — F0392 Unspecified dementia, unspecified severity, with psychotic disturbance: Secondary | ICD-10-CM | POA: Diagnosis not present

## 2022-06-17 DIAGNOSIS — F419 Anxiety disorder, unspecified: Secondary | ICD-10-CM | POA: Diagnosis not present

## 2022-06-17 DIAGNOSIS — F0393 Unspecified dementia, unspecified severity, with mood disturbance: Secondary | ICD-10-CM | POA: Diagnosis not present

## 2022-06-17 DIAGNOSIS — J449 Chronic obstructive pulmonary disease, unspecified: Secondary | ICD-10-CM | POA: Diagnosis not present

## 2022-06-17 DIAGNOSIS — M4322 Fusion of spine, cervical region: Secondary | ICD-10-CM | POA: Diagnosis not present

## 2022-06-17 DIAGNOSIS — F0392 Unspecified dementia, unspecified severity, with psychotic disturbance: Secondary | ICD-10-CM | POA: Diagnosis not present

## 2022-06-17 DIAGNOSIS — F331 Major depressive disorder, recurrent, moderate: Secondary | ICD-10-CM | POA: Diagnosis not present

## 2022-06-17 DIAGNOSIS — R296 Repeated falls: Secondary | ICD-10-CM | POA: Diagnosis not present

## 2022-06-17 DIAGNOSIS — M6281 Muscle weakness (generalized): Secondary | ICD-10-CM | POA: Diagnosis not present

## 2022-06-17 DIAGNOSIS — R278 Other lack of coordination: Secondary | ICD-10-CM | POA: Diagnosis not present

## 2022-06-18 DIAGNOSIS — R278 Other lack of coordination: Secondary | ICD-10-CM | POA: Diagnosis not present

## 2022-06-18 DIAGNOSIS — R296 Repeated falls: Secondary | ICD-10-CM | POA: Diagnosis not present

## 2022-06-18 DIAGNOSIS — M6281 Muscle weakness (generalized): Secondary | ICD-10-CM | POA: Diagnosis not present

## 2022-06-18 DIAGNOSIS — M4322 Fusion of spine, cervical region: Secondary | ICD-10-CM | POA: Diagnosis not present

## 2022-06-18 DIAGNOSIS — J449 Chronic obstructive pulmonary disease, unspecified: Secondary | ICD-10-CM | POA: Diagnosis not present

## 2022-06-18 DIAGNOSIS — F0392 Unspecified dementia, unspecified severity, with psychotic disturbance: Secondary | ICD-10-CM | POA: Diagnosis not present

## 2022-06-19 DIAGNOSIS — R296 Repeated falls: Secondary | ICD-10-CM | POA: Diagnosis not present

## 2022-06-19 DIAGNOSIS — F0392 Unspecified dementia, unspecified severity, with psychotic disturbance: Secondary | ICD-10-CM | POA: Diagnosis not present

## 2022-06-19 DIAGNOSIS — J449 Chronic obstructive pulmonary disease, unspecified: Secondary | ICD-10-CM | POA: Diagnosis not present

## 2022-06-19 DIAGNOSIS — M4322 Fusion of spine, cervical region: Secondary | ICD-10-CM | POA: Diagnosis not present

## 2022-06-19 DIAGNOSIS — R278 Other lack of coordination: Secondary | ICD-10-CM | POA: Diagnosis not present

## 2022-06-19 DIAGNOSIS — N390 Urinary tract infection, site not specified: Secondary | ICD-10-CM | POA: Diagnosis not present

## 2022-06-19 DIAGNOSIS — M6281 Muscle weakness (generalized): Secondary | ICD-10-CM | POA: Diagnosis not present

## 2022-06-20 DIAGNOSIS — F0392 Unspecified dementia, unspecified severity, with psychotic disturbance: Secondary | ICD-10-CM | POA: Diagnosis not present

## 2022-06-20 DIAGNOSIS — R296 Repeated falls: Secondary | ICD-10-CM | POA: Diagnosis not present

## 2022-06-20 DIAGNOSIS — M6281 Muscle weakness (generalized): Secondary | ICD-10-CM | POA: Diagnosis not present

## 2022-06-20 DIAGNOSIS — J449 Chronic obstructive pulmonary disease, unspecified: Secondary | ICD-10-CM | POA: Diagnosis not present

## 2022-06-20 DIAGNOSIS — M4322 Fusion of spine, cervical region: Secondary | ICD-10-CM | POA: Diagnosis not present

## 2022-06-20 DIAGNOSIS — R278 Other lack of coordination: Secondary | ICD-10-CM | POA: Diagnosis not present

## 2022-06-23 DIAGNOSIS — M4322 Fusion of spine, cervical region: Secondary | ICD-10-CM | POA: Diagnosis not present

## 2022-06-23 DIAGNOSIS — N400 Enlarged prostate without lower urinary tract symptoms: Secondary | ICD-10-CM | POA: Diagnosis not present

## 2022-06-23 DIAGNOSIS — M6281 Muscle weakness (generalized): Secondary | ICD-10-CM | POA: Diagnosis not present

## 2022-06-23 DIAGNOSIS — B9620 Unspecified Escherichia coli [E. coli] as the cause of diseases classified elsewhere: Secondary | ICD-10-CM | POA: Diagnosis not present

## 2022-06-23 DIAGNOSIS — R296 Repeated falls: Secondary | ICD-10-CM | POA: Diagnosis not present

## 2022-06-23 DIAGNOSIS — R278 Other lack of coordination: Secondary | ICD-10-CM | POA: Diagnosis not present

## 2022-06-23 DIAGNOSIS — N390 Urinary tract infection, site not specified: Secondary | ICD-10-CM | POA: Diagnosis not present

## 2022-06-23 DIAGNOSIS — D509 Iron deficiency anemia, unspecified: Secondary | ICD-10-CM | POA: Diagnosis not present

## 2022-06-23 DIAGNOSIS — N139 Obstructive and reflux uropathy, unspecified: Secondary | ICD-10-CM | POA: Diagnosis not present

## 2022-06-23 DIAGNOSIS — J449 Chronic obstructive pulmonary disease, unspecified: Secondary | ICD-10-CM | POA: Diagnosis not present

## 2022-06-23 DIAGNOSIS — K5900 Constipation, unspecified: Secondary | ICD-10-CM | POA: Diagnosis not present

## 2022-06-23 DIAGNOSIS — F0392 Unspecified dementia, unspecified severity, with psychotic disturbance: Secondary | ICD-10-CM | POA: Diagnosis not present

## 2022-06-24 DIAGNOSIS — M6281 Muscle weakness (generalized): Secondary | ICD-10-CM | POA: Diagnosis not present

## 2022-06-24 DIAGNOSIS — R278 Other lack of coordination: Secondary | ICD-10-CM | POA: Diagnosis not present

## 2022-06-24 DIAGNOSIS — R296 Repeated falls: Secondary | ICD-10-CM | POA: Diagnosis not present

## 2022-06-24 DIAGNOSIS — F0392 Unspecified dementia, unspecified severity, with psychotic disturbance: Secondary | ICD-10-CM | POA: Diagnosis not present

## 2022-06-24 DIAGNOSIS — M4322 Fusion of spine, cervical region: Secondary | ICD-10-CM | POA: Diagnosis not present

## 2022-06-24 DIAGNOSIS — J449 Chronic obstructive pulmonary disease, unspecified: Secondary | ICD-10-CM | POA: Diagnosis not present

## 2022-06-25 DIAGNOSIS — R296 Repeated falls: Secondary | ICD-10-CM | POA: Diagnosis not present

## 2022-06-25 DIAGNOSIS — F0392 Unspecified dementia, unspecified severity, with psychotic disturbance: Secondary | ICD-10-CM | POA: Diagnosis not present

## 2022-06-25 DIAGNOSIS — J449 Chronic obstructive pulmonary disease, unspecified: Secondary | ICD-10-CM | POA: Diagnosis not present

## 2022-06-25 DIAGNOSIS — M4322 Fusion of spine, cervical region: Secondary | ICD-10-CM | POA: Diagnosis not present

## 2022-06-25 DIAGNOSIS — M6281 Muscle weakness (generalized): Secondary | ICD-10-CM | POA: Diagnosis not present

## 2022-06-25 DIAGNOSIS — R278 Other lack of coordination: Secondary | ICD-10-CM | POA: Diagnosis not present

## 2022-06-26 DIAGNOSIS — J449 Chronic obstructive pulmonary disease, unspecified: Secondary | ICD-10-CM | POA: Diagnosis not present

## 2022-06-26 DIAGNOSIS — M4322 Fusion of spine, cervical region: Secondary | ICD-10-CM | POA: Diagnosis not present

## 2022-06-26 DIAGNOSIS — F0392 Unspecified dementia, unspecified severity, with psychotic disturbance: Secondary | ICD-10-CM | POA: Diagnosis not present

## 2022-06-26 DIAGNOSIS — M6281 Muscle weakness (generalized): Secondary | ICD-10-CM | POA: Diagnosis not present

## 2022-06-26 DIAGNOSIS — R278 Other lack of coordination: Secondary | ICD-10-CM | POA: Diagnosis not present

## 2022-06-26 DIAGNOSIS — R296 Repeated falls: Secondary | ICD-10-CM | POA: Diagnosis not present

## 2022-06-27 DIAGNOSIS — R278 Other lack of coordination: Secondary | ICD-10-CM | POA: Diagnosis not present

## 2022-06-27 DIAGNOSIS — M6281 Muscle weakness (generalized): Secondary | ICD-10-CM | POA: Diagnosis not present

## 2022-06-27 DIAGNOSIS — F0394 Unspecified dementia, unspecified severity, with anxiety: Secondary | ICD-10-CM | POA: Diagnosis not present

## 2022-06-27 DIAGNOSIS — J449 Chronic obstructive pulmonary disease, unspecified: Secondary | ICD-10-CM | POA: Diagnosis not present

## 2022-06-27 DIAGNOSIS — Z960 Presence of urogenital implants: Secondary | ICD-10-CM | POA: Diagnosis not present

## 2022-06-27 DIAGNOSIS — M4322 Fusion of spine, cervical region: Secondary | ICD-10-CM | POA: Diagnosis not present

## 2022-06-27 DIAGNOSIS — F32A Depression, unspecified: Secondary | ICD-10-CM | POA: Diagnosis not present

## 2022-06-27 DIAGNOSIS — F0392 Unspecified dementia, unspecified severity, with psychotic disturbance: Secondary | ICD-10-CM | POA: Diagnosis not present

## 2022-06-27 DIAGNOSIS — R296 Repeated falls: Secondary | ICD-10-CM | POA: Diagnosis not present

## 2022-06-27 DIAGNOSIS — F419 Anxiety disorder, unspecified: Secondary | ICD-10-CM | POA: Diagnosis not present

## 2022-06-27 DIAGNOSIS — N400 Enlarged prostate without lower urinary tract symptoms: Secondary | ICD-10-CM | POA: Diagnosis not present

## 2022-06-27 DIAGNOSIS — K5909 Other constipation: Secondary | ICD-10-CM | POA: Diagnosis not present

## 2022-06-27 DIAGNOSIS — F0393 Unspecified dementia, unspecified severity, with mood disturbance: Secondary | ICD-10-CM | POA: Diagnosis not present

## 2022-06-27 DIAGNOSIS — D509 Iron deficiency anemia, unspecified: Secondary | ICD-10-CM | POA: Diagnosis not present

## 2022-06-27 DIAGNOSIS — N139 Obstructive and reflux uropathy, unspecified: Secondary | ICD-10-CM | POA: Diagnosis not present

## 2022-06-30 DIAGNOSIS — M4322 Fusion of spine, cervical region: Secondary | ICD-10-CM | POA: Diagnosis not present

## 2022-06-30 DIAGNOSIS — F0392 Unspecified dementia, unspecified severity, with psychotic disturbance: Secondary | ICD-10-CM | POA: Diagnosis not present

## 2022-06-30 DIAGNOSIS — J449 Chronic obstructive pulmonary disease, unspecified: Secondary | ICD-10-CM | POA: Diagnosis not present

## 2022-06-30 DIAGNOSIS — R338 Other retention of urine: Secondary | ICD-10-CM | POA: Diagnosis not present

## 2022-06-30 DIAGNOSIS — M6281 Muscle weakness (generalized): Secondary | ICD-10-CM | POA: Diagnosis not present

## 2022-06-30 DIAGNOSIS — R278 Other lack of coordination: Secondary | ICD-10-CM | POA: Diagnosis not present

## 2022-06-30 DIAGNOSIS — R296 Repeated falls: Secondary | ICD-10-CM | POA: Diagnosis not present

## 2022-07-01 DIAGNOSIS — F419 Anxiety disorder, unspecified: Secondary | ICD-10-CM | POA: Diagnosis not present

## 2022-07-01 DIAGNOSIS — G309 Alzheimer's disease, unspecified: Secondary | ICD-10-CM | POA: Diagnosis not present

## 2022-07-01 DIAGNOSIS — F0280 Dementia in other diseases classified elsewhere without behavioral disturbance: Secondary | ICD-10-CM | POA: Diagnosis not present

## 2022-07-01 DIAGNOSIS — F331 Major depressive disorder, recurrent, moderate: Secondary | ICD-10-CM | POA: Diagnosis not present

## 2022-07-02 DIAGNOSIS — M4322 Fusion of spine, cervical region: Secondary | ICD-10-CM | POA: Diagnosis not present

## 2022-07-02 DIAGNOSIS — F0392 Unspecified dementia, unspecified severity, with psychotic disturbance: Secondary | ICD-10-CM | POA: Diagnosis not present

## 2022-07-02 DIAGNOSIS — R278 Other lack of coordination: Secondary | ICD-10-CM | POA: Diagnosis not present

## 2022-07-02 DIAGNOSIS — R4182 Altered mental status, unspecified: Secondary | ICD-10-CM | POA: Diagnosis not present

## 2022-07-02 DIAGNOSIS — M6281 Muscle weakness (generalized): Secondary | ICD-10-CM | POA: Diagnosis not present

## 2022-07-02 DIAGNOSIS — J449 Chronic obstructive pulmonary disease, unspecified: Secondary | ICD-10-CM | POA: Diagnosis not present

## 2022-07-02 DIAGNOSIS — R296 Repeated falls: Secondary | ICD-10-CM | POA: Diagnosis not present

## 2022-07-03 DIAGNOSIS — F0392 Unspecified dementia, unspecified severity, with psychotic disturbance: Secondary | ICD-10-CM | POA: Diagnosis not present

## 2022-07-03 DIAGNOSIS — M79674 Pain in right toe(s): Secondary | ICD-10-CM | POA: Diagnosis not present

## 2022-07-03 DIAGNOSIS — R278 Other lack of coordination: Secondary | ICD-10-CM | POA: Diagnosis not present

## 2022-07-03 DIAGNOSIS — M4322 Fusion of spine, cervical region: Secondary | ICD-10-CM | POA: Diagnosis not present

## 2022-07-03 DIAGNOSIS — M6281 Muscle weakness (generalized): Secondary | ICD-10-CM | POA: Diagnosis not present

## 2022-07-03 DIAGNOSIS — R296 Repeated falls: Secondary | ICD-10-CM | POA: Diagnosis not present

## 2022-07-03 DIAGNOSIS — M79675 Pain in left toe(s): Secondary | ICD-10-CM | POA: Diagnosis not present

## 2022-07-03 DIAGNOSIS — J449 Chronic obstructive pulmonary disease, unspecified: Secondary | ICD-10-CM | POA: Diagnosis not present

## 2022-07-03 DIAGNOSIS — B351 Tinea unguium: Secondary | ICD-10-CM | POA: Diagnosis not present

## 2022-07-04 DIAGNOSIS — M6281 Muscle weakness (generalized): Secondary | ICD-10-CM | POA: Diagnosis not present

## 2022-07-04 DIAGNOSIS — R293 Abnormal posture: Secondary | ICD-10-CM | POA: Diagnosis not present

## 2022-07-04 DIAGNOSIS — R1310 Dysphagia, unspecified: Secondary | ICD-10-CM | POA: Diagnosis not present

## 2022-07-04 DIAGNOSIS — R41841 Cognitive communication deficit: Secondary | ICD-10-CM | POA: Diagnosis not present

## 2022-07-04 DIAGNOSIS — M4322 Fusion of spine, cervical region: Secondary | ICD-10-CM | POA: Diagnosis not present

## 2022-07-04 DIAGNOSIS — R278 Other lack of coordination: Secondary | ICD-10-CM | POA: Diagnosis not present

## 2022-07-04 DIAGNOSIS — F0392 Unspecified dementia, unspecified severity, with psychotic disturbance: Secondary | ICD-10-CM | POA: Diagnosis not present

## 2022-07-04 DIAGNOSIS — R296 Repeated falls: Secondary | ICD-10-CM | POA: Diagnosis not present

## 2022-07-04 DIAGNOSIS — R2689 Other abnormalities of gait and mobility: Secondary | ICD-10-CM | POA: Diagnosis not present

## 2022-07-04 DIAGNOSIS — J449 Chronic obstructive pulmonary disease, unspecified: Secondary | ICD-10-CM | POA: Diagnosis not present

## 2022-07-07 DIAGNOSIS — J449 Chronic obstructive pulmonary disease, unspecified: Secondary | ICD-10-CM | POA: Diagnosis not present

## 2022-07-07 DIAGNOSIS — R278 Other lack of coordination: Secondary | ICD-10-CM | POA: Diagnosis not present

## 2022-07-07 DIAGNOSIS — F0392 Unspecified dementia, unspecified severity, with psychotic disturbance: Secondary | ICD-10-CM | POA: Diagnosis not present

## 2022-07-07 DIAGNOSIS — R296 Repeated falls: Secondary | ICD-10-CM | POA: Diagnosis not present

## 2022-07-07 DIAGNOSIS — M6281 Muscle weakness (generalized): Secondary | ICD-10-CM | POA: Diagnosis not present

## 2022-07-07 DIAGNOSIS — M4322 Fusion of spine, cervical region: Secondary | ICD-10-CM | POA: Diagnosis not present

## 2022-07-08 DIAGNOSIS — R296 Repeated falls: Secondary | ICD-10-CM | POA: Diagnosis not present

## 2022-07-08 DIAGNOSIS — J449 Chronic obstructive pulmonary disease, unspecified: Secondary | ICD-10-CM | POA: Diagnosis not present

## 2022-07-08 DIAGNOSIS — F0392 Unspecified dementia, unspecified severity, with psychotic disturbance: Secondary | ICD-10-CM | POA: Diagnosis not present

## 2022-07-08 DIAGNOSIS — M4322 Fusion of spine, cervical region: Secondary | ICD-10-CM | POA: Diagnosis not present

## 2022-07-08 DIAGNOSIS — R278 Other lack of coordination: Secondary | ICD-10-CM | POA: Diagnosis not present

## 2022-07-08 DIAGNOSIS — M6281 Muscle weakness (generalized): Secondary | ICD-10-CM | POA: Diagnosis not present

## 2022-07-09 DIAGNOSIS — F0393 Unspecified dementia, unspecified severity, with mood disturbance: Secondary | ICD-10-CM | POA: Diagnosis not present

## 2022-07-09 DIAGNOSIS — R278 Other lack of coordination: Secondary | ICD-10-CM | POA: Diagnosis not present

## 2022-07-09 DIAGNOSIS — J449 Chronic obstructive pulmonary disease, unspecified: Secondary | ICD-10-CM | POA: Diagnosis not present

## 2022-07-09 DIAGNOSIS — F32A Depression, unspecified: Secondary | ICD-10-CM | POA: Diagnosis not present

## 2022-07-09 DIAGNOSIS — F03911 Unspecified dementia, unspecified severity, with agitation: Secondary | ICD-10-CM | POA: Diagnosis not present

## 2022-07-09 DIAGNOSIS — M4322 Fusion of spine, cervical region: Secondary | ICD-10-CM | POA: Diagnosis not present

## 2022-07-09 DIAGNOSIS — F0392 Unspecified dementia, unspecified severity, with psychotic disturbance: Secondary | ICD-10-CM | POA: Diagnosis not present

## 2022-07-09 DIAGNOSIS — H109 Unspecified conjunctivitis: Secondary | ICD-10-CM | POA: Diagnosis not present

## 2022-07-09 DIAGNOSIS — R296 Repeated falls: Secondary | ICD-10-CM | POA: Diagnosis not present

## 2022-07-09 DIAGNOSIS — M6281 Muscle weakness (generalized): Secondary | ICD-10-CM | POA: Diagnosis not present

## 2022-07-10 DIAGNOSIS — R278 Other lack of coordination: Secondary | ICD-10-CM | POA: Diagnosis not present

## 2022-07-10 DIAGNOSIS — M6281 Muscle weakness (generalized): Secondary | ICD-10-CM | POA: Diagnosis not present

## 2022-07-10 DIAGNOSIS — F0392 Unspecified dementia, unspecified severity, with psychotic disturbance: Secondary | ICD-10-CM | POA: Diagnosis not present

## 2022-07-10 DIAGNOSIS — M4322 Fusion of spine, cervical region: Secondary | ICD-10-CM | POA: Diagnosis not present

## 2022-07-10 DIAGNOSIS — J449 Chronic obstructive pulmonary disease, unspecified: Secondary | ICD-10-CM | POA: Diagnosis not present

## 2022-07-10 DIAGNOSIS — R296 Repeated falls: Secondary | ICD-10-CM | POA: Diagnosis not present

## 2022-07-11 DIAGNOSIS — R296 Repeated falls: Secondary | ICD-10-CM | POA: Diagnosis not present

## 2022-07-11 DIAGNOSIS — M6281 Muscle weakness (generalized): Secondary | ICD-10-CM | POA: Diagnosis not present

## 2022-07-11 DIAGNOSIS — R278 Other lack of coordination: Secondary | ICD-10-CM | POA: Diagnosis not present

## 2022-07-11 DIAGNOSIS — F0392 Unspecified dementia, unspecified severity, with psychotic disturbance: Secondary | ICD-10-CM | POA: Diagnosis not present

## 2022-07-11 DIAGNOSIS — J449 Chronic obstructive pulmonary disease, unspecified: Secondary | ICD-10-CM | POA: Diagnosis not present

## 2022-07-11 DIAGNOSIS — M4322 Fusion of spine, cervical region: Secondary | ICD-10-CM | POA: Diagnosis not present

## 2022-07-14 DIAGNOSIS — R296 Repeated falls: Secondary | ICD-10-CM | POA: Diagnosis not present

## 2022-07-14 DIAGNOSIS — R278 Other lack of coordination: Secondary | ICD-10-CM | POA: Diagnosis not present

## 2022-07-14 DIAGNOSIS — J449 Chronic obstructive pulmonary disease, unspecified: Secondary | ICD-10-CM | POA: Diagnosis not present

## 2022-07-14 DIAGNOSIS — M4322 Fusion of spine, cervical region: Secondary | ICD-10-CM | POA: Diagnosis not present

## 2022-07-14 DIAGNOSIS — F0392 Unspecified dementia, unspecified severity, with psychotic disturbance: Secondary | ICD-10-CM | POA: Diagnosis not present

## 2022-07-14 DIAGNOSIS — M6281 Muscle weakness (generalized): Secondary | ICD-10-CM | POA: Diagnosis not present

## 2022-07-15 DIAGNOSIS — M6281 Muscle weakness (generalized): Secondary | ICD-10-CM | POA: Diagnosis not present

## 2022-07-15 DIAGNOSIS — J449 Chronic obstructive pulmonary disease, unspecified: Secondary | ICD-10-CM | POA: Diagnosis not present

## 2022-07-15 DIAGNOSIS — R278 Other lack of coordination: Secondary | ICD-10-CM | POA: Diagnosis not present

## 2022-07-15 DIAGNOSIS — R296 Repeated falls: Secondary | ICD-10-CM | POA: Diagnosis not present

## 2022-07-15 DIAGNOSIS — F0392 Unspecified dementia, unspecified severity, with psychotic disturbance: Secondary | ICD-10-CM | POA: Diagnosis not present

## 2022-07-15 DIAGNOSIS — M4322 Fusion of spine, cervical region: Secondary | ICD-10-CM | POA: Diagnosis not present

## 2022-07-16 DIAGNOSIS — R278 Other lack of coordination: Secondary | ICD-10-CM | POA: Diagnosis not present

## 2022-07-16 DIAGNOSIS — M6281 Muscle weakness (generalized): Secondary | ICD-10-CM | POA: Diagnosis not present

## 2022-07-16 DIAGNOSIS — R296 Repeated falls: Secondary | ICD-10-CM | POA: Diagnosis not present

## 2022-07-16 DIAGNOSIS — J449 Chronic obstructive pulmonary disease, unspecified: Secondary | ICD-10-CM | POA: Diagnosis not present

## 2022-07-16 DIAGNOSIS — F0392 Unspecified dementia, unspecified severity, with psychotic disturbance: Secondary | ICD-10-CM | POA: Diagnosis not present

## 2022-07-16 DIAGNOSIS — M4322 Fusion of spine, cervical region: Secondary | ICD-10-CM | POA: Diagnosis not present

## 2022-07-17 DIAGNOSIS — M6281 Muscle weakness (generalized): Secondary | ICD-10-CM | POA: Diagnosis not present

## 2022-07-17 DIAGNOSIS — R296 Repeated falls: Secondary | ICD-10-CM | POA: Diagnosis not present

## 2022-07-17 DIAGNOSIS — F0392 Unspecified dementia, unspecified severity, with psychotic disturbance: Secondary | ICD-10-CM | POA: Diagnosis not present

## 2022-07-17 DIAGNOSIS — R278 Other lack of coordination: Secondary | ICD-10-CM | POA: Diagnosis not present

## 2022-07-17 DIAGNOSIS — J449 Chronic obstructive pulmonary disease, unspecified: Secondary | ICD-10-CM | POA: Diagnosis not present

## 2022-07-17 DIAGNOSIS — M4322 Fusion of spine, cervical region: Secondary | ICD-10-CM | POA: Diagnosis not present

## 2022-07-18 DIAGNOSIS — R278 Other lack of coordination: Secondary | ICD-10-CM | POA: Diagnosis not present

## 2022-07-18 DIAGNOSIS — M4322 Fusion of spine, cervical region: Secondary | ICD-10-CM | POA: Diagnosis not present

## 2022-07-18 DIAGNOSIS — J449 Chronic obstructive pulmonary disease, unspecified: Secondary | ICD-10-CM | POA: Diagnosis not present

## 2022-07-18 DIAGNOSIS — M6281 Muscle weakness (generalized): Secondary | ICD-10-CM | POA: Diagnosis not present

## 2022-07-18 DIAGNOSIS — R296 Repeated falls: Secondary | ICD-10-CM | POA: Diagnosis not present

## 2022-07-18 DIAGNOSIS — F0392 Unspecified dementia, unspecified severity, with psychotic disturbance: Secondary | ICD-10-CM | POA: Diagnosis not present

## 2022-07-21 DIAGNOSIS — M4322 Fusion of spine, cervical region: Secondary | ICD-10-CM | POA: Diagnosis not present

## 2022-07-21 DIAGNOSIS — M6281 Muscle weakness (generalized): Secondary | ICD-10-CM | POA: Diagnosis not present

## 2022-07-21 DIAGNOSIS — R296 Repeated falls: Secondary | ICD-10-CM | POA: Diagnosis not present

## 2022-07-21 DIAGNOSIS — F0392 Unspecified dementia, unspecified severity, with psychotic disturbance: Secondary | ICD-10-CM | POA: Diagnosis not present

## 2022-07-21 DIAGNOSIS — R278 Other lack of coordination: Secondary | ICD-10-CM | POA: Diagnosis not present

## 2022-07-21 DIAGNOSIS — J449 Chronic obstructive pulmonary disease, unspecified: Secondary | ICD-10-CM | POA: Diagnosis not present

## 2022-07-22 DIAGNOSIS — J449 Chronic obstructive pulmonary disease, unspecified: Secondary | ICD-10-CM | POA: Diagnosis not present

## 2022-07-22 DIAGNOSIS — R278 Other lack of coordination: Secondary | ICD-10-CM | POA: Diagnosis not present

## 2022-07-22 DIAGNOSIS — F0392 Unspecified dementia, unspecified severity, with psychotic disturbance: Secondary | ICD-10-CM | POA: Diagnosis not present

## 2022-07-22 DIAGNOSIS — M6281 Muscle weakness (generalized): Secondary | ICD-10-CM | POA: Diagnosis not present

## 2022-07-22 DIAGNOSIS — M4322 Fusion of spine, cervical region: Secondary | ICD-10-CM | POA: Diagnosis not present

## 2022-07-22 DIAGNOSIS — R296 Repeated falls: Secondary | ICD-10-CM | POA: Diagnosis not present

## 2022-07-23 DIAGNOSIS — R278 Other lack of coordination: Secondary | ICD-10-CM | POA: Diagnosis not present

## 2022-07-23 DIAGNOSIS — M6281 Muscle weakness (generalized): Secondary | ICD-10-CM | POA: Diagnosis not present

## 2022-07-23 DIAGNOSIS — F0392 Unspecified dementia, unspecified severity, with psychotic disturbance: Secondary | ICD-10-CM | POA: Diagnosis not present

## 2022-07-23 DIAGNOSIS — R296 Repeated falls: Secondary | ICD-10-CM | POA: Diagnosis not present

## 2022-07-23 DIAGNOSIS — M4322 Fusion of spine, cervical region: Secondary | ICD-10-CM | POA: Diagnosis not present

## 2022-07-23 DIAGNOSIS — J449 Chronic obstructive pulmonary disease, unspecified: Secondary | ICD-10-CM | POA: Diagnosis not present

## 2022-07-24 DIAGNOSIS — M6281 Muscle weakness (generalized): Secondary | ICD-10-CM | POA: Diagnosis not present

## 2022-07-24 DIAGNOSIS — R296 Repeated falls: Secondary | ICD-10-CM | POA: Diagnosis not present

## 2022-07-24 DIAGNOSIS — J449 Chronic obstructive pulmonary disease, unspecified: Secondary | ICD-10-CM | POA: Diagnosis not present

## 2022-07-24 DIAGNOSIS — R278 Other lack of coordination: Secondary | ICD-10-CM | POA: Diagnosis not present

## 2022-07-24 DIAGNOSIS — F0392 Unspecified dementia, unspecified severity, with psychotic disturbance: Secondary | ICD-10-CM | POA: Diagnosis not present

## 2022-07-24 DIAGNOSIS — M4322 Fusion of spine, cervical region: Secondary | ICD-10-CM | POA: Diagnosis not present

## 2022-07-25 DIAGNOSIS — M6281 Muscle weakness (generalized): Secondary | ICD-10-CM | POA: Diagnosis not present

## 2022-07-25 DIAGNOSIS — R278 Other lack of coordination: Secondary | ICD-10-CM | POA: Diagnosis not present

## 2022-07-25 DIAGNOSIS — F0392 Unspecified dementia, unspecified severity, with psychotic disturbance: Secondary | ICD-10-CM | POA: Diagnosis not present

## 2022-07-25 DIAGNOSIS — J449 Chronic obstructive pulmonary disease, unspecified: Secondary | ICD-10-CM | POA: Diagnosis not present

## 2022-07-25 DIAGNOSIS — R296 Repeated falls: Secondary | ICD-10-CM | POA: Diagnosis not present

## 2022-07-25 DIAGNOSIS — M4322 Fusion of spine, cervical region: Secondary | ICD-10-CM | POA: Diagnosis not present

## 2022-07-28 DIAGNOSIS — F0392 Unspecified dementia, unspecified severity, with psychotic disturbance: Secondary | ICD-10-CM | POA: Diagnosis not present

## 2022-07-28 DIAGNOSIS — R296 Repeated falls: Secondary | ICD-10-CM | POA: Diagnosis not present

## 2022-07-28 DIAGNOSIS — M4322 Fusion of spine, cervical region: Secondary | ICD-10-CM | POA: Diagnosis not present

## 2022-07-28 DIAGNOSIS — R278 Other lack of coordination: Secondary | ICD-10-CM | POA: Diagnosis not present

## 2022-07-28 DIAGNOSIS — M6281 Muscle weakness (generalized): Secondary | ICD-10-CM | POA: Diagnosis not present

## 2022-07-28 DIAGNOSIS — J449 Chronic obstructive pulmonary disease, unspecified: Secondary | ICD-10-CM | POA: Diagnosis not present

## 2022-07-29 DIAGNOSIS — M4322 Fusion of spine, cervical region: Secondary | ICD-10-CM | POA: Diagnosis not present

## 2022-07-29 DIAGNOSIS — F331 Major depressive disorder, recurrent, moderate: Secondary | ICD-10-CM | POA: Diagnosis not present

## 2022-07-29 DIAGNOSIS — F0280 Dementia in other diseases classified elsewhere without behavioral disturbance: Secondary | ICD-10-CM | POA: Diagnosis not present

## 2022-07-29 DIAGNOSIS — F0392 Unspecified dementia, unspecified severity, with psychotic disturbance: Secondary | ICD-10-CM | POA: Diagnosis not present

## 2022-07-29 DIAGNOSIS — F419 Anxiety disorder, unspecified: Secondary | ICD-10-CM | POA: Diagnosis not present

## 2022-07-29 DIAGNOSIS — G309 Alzheimer's disease, unspecified: Secondary | ICD-10-CM | POA: Diagnosis not present

## 2022-07-29 DIAGNOSIS — R296 Repeated falls: Secondary | ICD-10-CM | POA: Diagnosis not present

## 2022-07-29 DIAGNOSIS — M6281 Muscle weakness (generalized): Secondary | ICD-10-CM | POA: Diagnosis not present

## 2022-07-29 DIAGNOSIS — R278 Other lack of coordination: Secondary | ICD-10-CM | POA: Diagnosis not present

## 2022-07-29 DIAGNOSIS — J449 Chronic obstructive pulmonary disease, unspecified: Secondary | ICD-10-CM | POA: Diagnosis not present

## 2022-07-30 DIAGNOSIS — F0392 Unspecified dementia, unspecified severity, with psychotic disturbance: Secondary | ICD-10-CM | POA: Diagnosis not present

## 2022-07-30 DIAGNOSIS — M6281 Muscle weakness (generalized): Secondary | ICD-10-CM | POA: Diagnosis not present

## 2022-07-30 DIAGNOSIS — J449 Chronic obstructive pulmonary disease, unspecified: Secondary | ICD-10-CM | POA: Diagnosis not present

## 2022-07-30 DIAGNOSIS — M4322 Fusion of spine, cervical region: Secondary | ICD-10-CM | POA: Diagnosis not present

## 2022-07-30 DIAGNOSIS — R278 Other lack of coordination: Secondary | ICD-10-CM | POA: Diagnosis not present

## 2022-07-30 DIAGNOSIS — R296 Repeated falls: Secondary | ICD-10-CM | POA: Diagnosis not present

## 2022-07-31 DIAGNOSIS — M4322 Fusion of spine, cervical region: Secondary | ICD-10-CM | POA: Diagnosis not present

## 2022-07-31 DIAGNOSIS — M6281 Muscle weakness (generalized): Secondary | ICD-10-CM | POA: Diagnosis not present

## 2022-07-31 DIAGNOSIS — J449 Chronic obstructive pulmonary disease, unspecified: Secondary | ICD-10-CM | POA: Diagnosis not present

## 2022-07-31 DIAGNOSIS — F0392 Unspecified dementia, unspecified severity, with psychotic disturbance: Secondary | ICD-10-CM | POA: Diagnosis not present

## 2022-07-31 DIAGNOSIS — N390 Urinary tract infection, site not specified: Secondary | ICD-10-CM | POA: Diagnosis not present

## 2022-07-31 DIAGNOSIS — R296 Repeated falls: Secondary | ICD-10-CM | POA: Diagnosis not present

## 2022-07-31 DIAGNOSIS — R278 Other lack of coordination: Secondary | ICD-10-CM | POA: Diagnosis not present

## 2022-08-22 DIAGNOSIS — F419 Anxiety disorder, unspecified: Secondary | ICD-10-CM | POA: Diagnosis not present

## 2022-08-22 DIAGNOSIS — F32A Depression, unspecified: Secondary | ICD-10-CM | POA: Diagnosis not present

## 2022-08-22 DIAGNOSIS — F0392 Unspecified dementia, unspecified severity, with psychotic disturbance: Secondary | ICD-10-CM | POA: Diagnosis not present

## 2022-08-22 DIAGNOSIS — D509 Iron deficiency anemia, unspecified: Secondary | ICD-10-CM | POA: Diagnosis not present

## 2022-08-22 DIAGNOSIS — R339 Retention of urine, unspecified: Secondary | ICD-10-CM | POA: Diagnosis not present

## 2022-08-22 DIAGNOSIS — Z960 Presence of urogenital implants: Secondary | ICD-10-CM | POA: Diagnosis not present

## 2022-08-22 DIAGNOSIS — F0394 Unspecified dementia, unspecified severity, with anxiety: Secondary | ICD-10-CM | POA: Diagnosis not present

## 2022-08-22 DIAGNOSIS — F0393 Unspecified dementia, unspecified severity, with mood disturbance: Secondary | ICD-10-CM | POA: Diagnosis not present

## 2022-08-25 DIAGNOSIS — F0394 Unspecified dementia, unspecified severity, with anxiety: Secondary | ICD-10-CM | POA: Diagnosis not present

## 2022-08-25 DIAGNOSIS — F0393 Unspecified dementia, unspecified severity, with mood disturbance: Secondary | ICD-10-CM | POA: Diagnosis not present

## 2022-08-25 DIAGNOSIS — D509 Iron deficiency anemia, unspecified: Secondary | ICD-10-CM | POA: Diagnosis not present

## 2022-08-25 DIAGNOSIS — F03911 Unspecified dementia, unspecified severity, with agitation: Secondary | ICD-10-CM | POA: Diagnosis not present

## 2022-08-25 DIAGNOSIS — F419 Anxiety disorder, unspecified: Secondary | ICD-10-CM | POA: Diagnosis not present

## 2022-08-25 DIAGNOSIS — F32A Depression, unspecified: Secondary | ICD-10-CM | POA: Diagnosis not present

## 2022-08-25 DIAGNOSIS — F0392 Unspecified dementia, unspecified severity, with psychotic disturbance: Secondary | ICD-10-CM | POA: Diagnosis not present

## 2022-08-26 DIAGNOSIS — F0280 Dementia in other diseases classified elsewhere without behavioral disturbance: Secondary | ICD-10-CM | POA: Diagnosis not present

## 2022-08-26 DIAGNOSIS — G894 Chronic pain syndrome: Secondary | ICD-10-CM | POA: Diagnosis not present

## 2022-08-26 DIAGNOSIS — G309 Alzheimer's disease, unspecified: Secondary | ICD-10-CM | POA: Diagnosis not present

## 2022-08-26 DIAGNOSIS — F331 Major depressive disorder, recurrent, moderate: Secondary | ICD-10-CM | POA: Diagnosis not present

## 2022-08-26 DIAGNOSIS — F419 Anxiety disorder, unspecified: Secondary | ICD-10-CM | POA: Diagnosis not present

## 2022-08-26 DIAGNOSIS — D509 Iron deficiency anemia, unspecified: Secondary | ICD-10-CM | POA: Diagnosis not present

## 2022-09-08 DIAGNOSIS — N390 Urinary tract infection, site not specified: Secondary | ICD-10-CM | POA: Diagnosis not present

## 2022-09-08 DIAGNOSIS — F0394 Unspecified dementia, unspecified severity, with anxiety: Secondary | ICD-10-CM | POA: Diagnosis not present

## 2022-09-08 DIAGNOSIS — F0393 Unspecified dementia, unspecified severity, with mood disturbance: Secondary | ICD-10-CM | POA: Diagnosis not present

## 2022-09-08 DIAGNOSIS — F32A Depression, unspecified: Secondary | ICD-10-CM | POA: Diagnosis not present

## 2022-09-08 DIAGNOSIS — F419 Anxiety disorder, unspecified: Secondary | ICD-10-CM | POA: Diagnosis not present

## 2022-09-12 DIAGNOSIS — F0392 Unspecified dementia, unspecified severity, with psychotic disturbance: Secondary | ICD-10-CM | POA: Diagnosis not present

## 2022-09-12 DIAGNOSIS — R339 Retention of urine, unspecified: Secondary | ICD-10-CM | POA: Diagnosis not present

## 2022-09-12 DIAGNOSIS — M159 Polyosteoarthritis, unspecified: Secondary | ICD-10-CM | POA: Diagnosis not present

## 2022-09-12 DIAGNOSIS — F0394 Unspecified dementia, unspecified severity, with anxiety: Secondary | ICD-10-CM | POA: Diagnosis not present

## 2022-09-12 DIAGNOSIS — Z960 Presence of urogenital implants: Secondary | ICD-10-CM | POA: Diagnosis not present

## 2022-09-16 DIAGNOSIS — Z743 Need for continuous supervision: Secondary | ICD-10-CM | POA: Diagnosis not present

## 2022-09-16 DIAGNOSIS — N390 Urinary tract infection, site not specified: Secondary | ICD-10-CM | POA: Diagnosis not present

## 2022-09-16 DIAGNOSIS — T83028A Displacement of other indwelling urethral catheter, initial encounter: Secondary | ICD-10-CM | POA: Diagnosis not present

## 2022-09-16 DIAGNOSIS — R300 Dysuria: Secondary | ICD-10-CM | POA: Diagnosis not present

## 2022-09-16 DIAGNOSIS — Z7401 Bed confinement status: Secondary | ICD-10-CM | POA: Diagnosis not present

## 2022-09-16 DIAGNOSIS — R58 Hemorrhage, not elsewhere classified: Secondary | ICD-10-CM | POA: Diagnosis not present

## 2022-09-16 DIAGNOSIS — T83091A Other mechanical complication of indwelling urethral catheter, initial encounter: Secondary | ICD-10-CM | POA: Diagnosis not present

## 2022-09-17 DIAGNOSIS — F0394 Unspecified dementia, unspecified severity, with anxiety: Secondary | ICD-10-CM | POA: Diagnosis not present

## 2022-09-17 DIAGNOSIS — F0392 Unspecified dementia, unspecified severity, with psychotic disturbance: Secondary | ICD-10-CM | POA: Diagnosis not present

## 2022-09-17 DIAGNOSIS — F419 Anxiety disorder, unspecified: Secondary | ICD-10-CM | POA: Diagnosis not present

## 2022-09-17 DIAGNOSIS — N390 Urinary tract infection, site not specified: Secondary | ICD-10-CM | POA: Diagnosis not present

## 2022-09-23 DIAGNOSIS — F339 Major depressive disorder, recurrent, unspecified: Secondary | ICD-10-CM | POA: Diagnosis not present

## 2022-09-23 DIAGNOSIS — F0390 Unspecified dementia without behavioral disturbance: Secondary | ICD-10-CM | POA: Diagnosis not present

## 2022-09-23 DIAGNOSIS — G894 Chronic pain syndrome: Secondary | ICD-10-CM | POA: Diagnosis not present

## 2022-10-01 DIAGNOSIS — Z960 Presence of urogenital implants: Secondary | ICD-10-CM | POA: Diagnosis not present

## 2022-10-01 DIAGNOSIS — F0394 Unspecified dementia, unspecified severity, with anxiety: Secondary | ICD-10-CM | POA: Diagnosis not present

## 2022-10-01 DIAGNOSIS — R339 Retention of urine, unspecified: Secondary | ICD-10-CM | POA: Diagnosis not present

## 2022-10-01 DIAGNOSIS — F03911 Unspecified dementia, unspecified severity, with agitation: Secondary | ICD-10-CM | POA: Diagnosis not present

## 2022-10-01 DIAGNOSIS — F0393 Unspecified dementia, unspecified severity, with mood disturbance: Secondary | ICD-10-CM | POA: Diagnosis not present

## 2022-10-01 DIAGNOSIS — F32A Depression, unspecified: Secondary | ICD-10-CM | POA: Diagnosis not present

## 2022-10-01 DIAGNOSIS — F419 Anxiety disorder, unspecified: Secondary | ICD-10-CM | POA: Diagnosis not present

## 2022-10-02 DIAGNOSIS — N401 Enlarged prostate with lower urinary tract symptoms: Secondary | ICD-10-CM | POA: Diagnosis not present

## 2022-10-02 DIAGNOSIS — R338 Other retention of urine: Secondary | ICD-10-CM | POA: Diagnosis not present

## 2022-10-10 DIAGNOSIS — F32A Depression, unspecified: Secondary | ICD-10-CM | POA: Diagnosis not present

## 2022-10-10 DIAGNOSIS — R339 Retention of urine, unspecified: Secondary | ICD-10-CM | POA: Diagnosis not present

## 2022-10-10 DIAGNOSIS — F03911 Unspecified dementia, unspecified severity, with agitation: Secondary | ICD-10-CM | POA: Diagnosis not present

## 2022-10-10 DIAGNOSIS — F0394 Unspecified dementia, unspecified severity, with anxiety: Secondary | ICD-10-CM | POA: Diagnosis not present

## 2022-10-10 DIAGNOSIS — Z960 Presence of urogenital implants: Secondary | ICD-10-CM | POA: Diagnosis not present

## 2022-10-10 DIAGNOSIS — F0393 Unspecified dementia, unspecified severity, with mood disturbance: Secondary | ICD-10-CM | POA: Diagnosis not present

## 2022-10-10 DIAGNOSIS — F419 Anxiety disorder, unspecified: Secondary | ICD-10-CM | POA: Diagnosis not present

## 2022-10-13 ENCOUNTER — Other Ambulatory Visit: Payer: Self-pay | Admitting: Urology

## 2022-10-13 DIAGNOSIS — N401 Enlarged prostate with lower urinary tract symptoms: Secondary | ICD-10-CM

## 2022-10-20 ENCOUNTER — Telehealth: Payer: Self-pay | Admitting: Urology

## 2022-10-20 NOTE — Telephone Encounter (Signed)
10/20/22-tt pt guardian Jeffrey [name]. They have decided to not do this procedure due to pt dementia and being combative

## 2022-10-21 DIAGNOSIS — F411 Generalized anxiety disorder: Secondary | ICD-10-CM | POA: Diagnosis not present

## 2022-10-21 DIAGNOSIS — F0390 Unspecified dementia without behavioral disturbance: Secondary | ICD-10-CM | POA: Diagnosis not present

## 2022-10-21 DIAGNOSIS — N390 Urinary tract infection, site not specified: Secondary | ICD-10-CM | POA: Diagnosis not present

## 2022-10-21 DIAGNOSIS — F29 Unspecified psychosis not due to a substance or known physiological condition: Secondary | ICD-10-CM | POA: Diagnosis not present

## 2022-10-21 DIAGNOSIS — F331 Major depressive disorder, recurrent, moderate: Secondary | ICD-10-CM | POA: Diagnosis not present

## 2022-10-23 DIAGNOSIS — Z743 Need for continuous supervision: Secondary | ICD-10-CM | POA: Diagnosis not present

## 2022-10-23 DIAGNOSIS — F0390 Unspecified dementia without behavioral disturbance: Secondary | ICD-10-CM | POA: Diagnosis not present

## 2022-10-23 DIAGNOSIS — S3994XA Unspecified injury of external genitals, initial encounter: Secondary | ICD-10-CM | POA: Diagnosis not present

## 2022-10-23 DIAGNOSIS — I959 Hypotension, unspecified: Secondary | ICD-10-CM | POA: Diagnosis not present

## 2022-10-23 DIAGNOSIS — N4889 Other specified disorders of penis: Secondary | ICD-10-CM | POA: Diagnosis not present

## 2022-10-23 DIAGNOSIS — T83028A Displacement of other indwelling urethral catheter, initial encounter: Secondary | ICD-10-CM | POA: Diagnosis not present

## 2022-10-23 DIAGNOSIS — R58 Hemorrhage, not elsewhere classified: Secondary | ICD-10-CM | POA: Diagnosis not present

## 2022-10-24 DIAGNOSIS — R339 Retention of urine, unspecified: Secondary | ICD-10-CM | POA: Diagnosis not present

## 2022-10-24 DIAGNOSIS — F0393 Unspecified dementia, unspecified severity, with mood disturbance: Secondary | ICD-10-CM | POA: Diagnosis not present

## 2022-10-24 DIAGNOSIS — Z960 Presence of urogenital implants: Secondary | ICD-10-CM | POA: Diagnosis not present

## 2022-10-24 DIAGNOSIS — F0394 Unspecified dementia, unspecified severity, with anxiety: Secondary | ICD-10-CM | POA: Diagnosis not present

## 2022-10-24 DIAGNOSIS — F319 Bipolar disorder, unspecified: Secondary | ICD-10-CM | POA: Diagnosis not present

## 2022-10-24 DIAGNOSIS — F419 Anxiety disorder, unspecified: Secondary | ICD-10-CM | POA: Diagnosis not present

## 2022-10-24 DIAGNOSIS — D509 Iron deficiency anemia, unspecified: Secondary | ICD-10-CM | POA: Diagnosis not present

## 2022-10-31 DIAGNOSIS — D509 Iron deficiency anemia, unspecified: Secondary | ICD-10-CM | POA: Diagnosis not present

## 2022-11-04 DIAGNOSIS — D509 Iron deficiency anemia, unspecified: Secondary | ICD-10-CM | POA: Diagnosis not present

## 2022-11-18 DIAGNOSIS — F331 Major depressive disorder, recurrent, moderate: Secondary | ICD-10-CM | POA: Diagnosis not present

## 2022-11-18 DIAGNOSIS — F03918 Unspecified dementia, unspecified severity, with other behavioral disturbance: Secondary | ICD-10-CM | POA: Diagnosis not present

## 2022-11-18 DIAGNOSIS — F062 Psychotic disorder with delusions due to known physiological condition: Secondary | ICD-10-CM | POA: Diagnosis not present

## 2022-11-18 DIAGNOSIS — F411 Generalized anxiety disorder: Secondary | ICD-10-CM | POA: Diagnosis not present

## 2022-12-09 DIAGNOSIS — D509 Iron deficiency anemia, unspecified: Secondary | ICD-10-CM | POA: Diagnosis not present

## 2022-12-09 DIAGNOSIS — Z8679 Personal history of other diseases of the circulatory system: Secondary | ICD-10-CM | POA: Diagnosis not present

## 2022-12-09 DIAGNOSIS — E119 Type 2 diabetes mellitus without complications: Secondary | ICD-10-CM | POA: Diagnosis not present

## 2022-12-16 DIAGNOSIS — F03918 Unspecified dementia, unspecified severity, with other behavioral disturbance: Secondary | ICD-10-CM | POA: Diagnosis not present

## 2022-12-16 DIAGNOSIS — F411 Generalized anxiety disorder: Secondary | ICD-10-CM | POA: Diagnosis not present

## 2022-12-16 DIAGNOSIS — F29 Unspecified psychosis not due to a substance or known physiological condition: Secondary | ICD-10-CM | POA: Diagnosis not present

## 2022-12-16 DIAGNOSIS — F339 Major depressive disorder, recurrent, unspecified: Secondary | ICD-10-CM | POA: Diagnosis not present

## 2022-12-16 DIAGNOSIS — G8929 Other chronic pain: Secondary | ICD-10-CM | POA: Diagnosis not present

## 2022-12-17 DIAGNOSIS — M79675 Pain in left toe(s): Secondary | ICD-10-CM | POA: Diagnosis not present

## 2022-12-17 DIAGNOSIS — M79674 Pain in right toe(s): Secondary | ICD-10-CM | POA: Diagnosis not present

## 2022-12-17 DIAGNOSIS — B351 Tinea unguium: Secondary | ICD-10-CM | POA: Diagnosis not present

## 2022-12-26 DIAGNOSIS — M159 Polyosteoarthritis, unspecified: Secondary | ICD-10-CM | POA: Diagnosis not present

## 2022-12-26 DIAGNOSIS — F32A Depression, unspecified: Secondary | ICD-10-CM | POA: Diagnosis not present

## 2022-12-26 DIAGNOSIS — D509 Iron deficiency anemia, unspecified: Secondary | ICD-10-CM | POA: Diagnosis not present

## 2022-12-26 DIAGNOSIS — R339 Retention of urine, unspecified: Secondary | ICD-10-CM | POA: Diagnosis not present

## 2022-12-26 DIAGNOSIS — F0394 Unspecified dementia, unspecified severity, with anxiety: Secondary | ICD-10-CM | POA: Diagnosis not present

## 2022-12-26 DIAGNOSIS — K5900 Constipation, unspecified: Secondary | ICD-10-CM | POA: Diagnosis not present

## 2023-01-02 DIAGNOSIS — Z960 Presence of urogenital implants: Secondary | ICD-10-CM | POA: Diagnosis not present

## 2023-01-02 DIAGNOSIS — F03911 Unspecified dementia, unspecified severity, with agitation: Secondary | ICD-10-CM | POA: Diagnosis not present

## 2023-01-02 DIAGNOSIS — F419 Anxiety disorder, unspecified: Secondary | ICD-10-CM | POA: Diagnosis not present

## 2023-01-02 DIAGNOSIS — F0394 Unspecified dementia, unspecified severity, with anxiety: Secondary | ICD-10-CM | POA: Diagnosis not present

## 2023-01-02 DIAGNOSIS — N390 Urinary tract infection, site not specified: Secondary | ICD-10-CM | POA: Diagnosis not present

## 2023-01-04 DIAGNOSIS — N390 Urinary tract infection, site not specified: Secondary | ICD-10-CM | POA: Diagnosis not present

## 2023-01-07 DIAGNOSIS — N390 Urinary tract infection, site not specified: Secondary | ICD-10-CM | POA: Diagnosis not present

## 2023-01-09 DIAGNOSIS — N390 Urinary tract infection, site not specified: Secondary | ICD-10-CM | POA: Diagnosis not present

## 2023-01-14 DIAGNOSIS — F03918 Unspecified dementia, unspecified severity, with other behavioral disturbance: Secondary | ICD-10-CM | POA: Diagnosis not present

## 2023-01-14 DIAGNOSIS — F29 Unspecified psychosis not due to a substance or known physiological condition: Secondary | ICD-10-CM | POA: Diagnosis not present

## 2023-01-14 DIAGNOSIS — F339 Major depressive disorder, recurrent, unspecified: Secondary | ICD-10-CM | POA: Diagnosis not present

## 2023-01-14 DIAGNOSIS — F411 Generalized anxiety disorder: Secondary | ICD-10-CM | POA: Diagnosis not present

## 2023-02-10 DIAGNOSIS — F03918 Unspecified dementia, unspecified severity, with other behavioral disturbance: Secondary | ICD-10-CM | POA: Diagnosis not present

## 2023-02-10 DIAGNOSIS — G8929 Other chronic pain: Secondary | ICD-10-CM | POA: Diagnosis not present

## 2023-02-10 DIAGNOSIS — F331 Major depressive disorder, recurrent, moderate: Secondary | ICD-10-CM | POA: Diagnosis not present

## 2023-02-10 DIAGNOSIS — F29 Unspecified psychosis not due to a substance or known physiological condition: Secondary | ICD-10-CM | POA: Diagnosis not present

## 2023-02-10 DIAGNOSIS — F411 Generalized anxiety disorder: Secondary | ICD-10-CM | POA: Diagnosis not present

## 2023-02-20 DIAGNOSIS — R300 Dysuria: Secondary | ICD-10-CM | POA: Diagnosis not present

## 2023-02-21 DIAGNOSIS — R531 Weakness: Secondary | ICD-10-CM | POA: Diagnosis not present

## 2023-02-21 DIAGNOSIS — S0990XA Unspecified injury of head, initial encounter: Secondary | ICD-10-CM | POA: Diagnosis not present

## 2023-02-21 DIAGNOSIS — Z043 Encounter for examination and observation following other accident: Secondary | ICD-10-CM | POA: Diagnosis not present

## 2023-02-21 DIAGNOSIS — R220 Localized swelling, mass and lump, head: Secondary | ICD-10-CM | POA: Diagnosis not present

## 2023-02-21 DIAGNOSIS — Z7401 Bed confinement status: Secondary | ICD-10-CM | POA: Diagnosis not present

## 2023-02-21 DIAGNOSIS — W19XXXA Unspecified fall, initial encounter: Secondary | ICD-10-CM | POA: Diagnosis not present

## 2023-02-21 DIAGNOSIS — R456 Violent behavior: Secondary | ICD-10-CM | POA: Diagnosis not present

## 2023-02-21 DIAGNOSIS — I6782 Cerebral ischemia: Secondary | ICD-10-CM | POA: Diagnosis not present

## 2023-02-22 DIAGNOSIS — N390 Urinary tract infection, site not specified: Secondary | ICD-10-CM | POA: Diagnosis not present

## 2023-02-22 DIAGNOSIS — Z87440 Personal history of urinary (tract) infections: Secondary | ICD-10-CM | POA: Diagnosis not present

## 2023-02-25 DIAGNOSIS — M6281 Muscle weakness (generalized): Secondary | ICD-10-CM | POA: Diagnosis not present

## 2023-02-25 DIAGNOSIS — R41841 Cognitive communication deficit: Secondary | ICD-10-CM | POA: Diagnosis not present

## 2023-02-25 DIAGNOSIS — R296 Repeated falls: Secondary | ICD-10-CM | POA: Diagnosis not present

## 2023-02-25 DIAGNOSIS — R1310 Dysphagia, unspecified: Secondary | ICD-10-CM | POA: Diagnosis not present

## 2023-02-25 DIAGNOSIS — R278 Other lack of coordination: Secondary | ICD-10-CM | POA: Diagnosis not present

## 2023-02-25 DIAGNOSIS — F0392 Unspecified dementia, unspecified severity, with psychotic disturbance: Secondary | ICD-10-CM | POA: Diagnosis not present

## 2023-02-25 DIAGNOSIS — R2689 Other abnormalities of gait and mobility: Secondary | ICD-10-CM | POA: Diagnosis not present

## 2023-02-25 DIAGNOSIS — M4322 Fusion of spine, cervical region: Secondary | ICD-10-CM | POA: Diagnosis not present

## 2023-02-25 DIAGNOSIS — J449 Chronic obstructive pulmonary disease, unspecified: Secondary | ICD-10-CM | POA: Diagnosis not present

## 2023-02-25 DIAGNOSIS — R293 Abnormal posture: Secondary | ICD-10-CM | POA: Diagnosis not present

## 2023-02-26 DIAGNOSIS — M4322 Fusion of spine, cervical region: Secondary | ICD-10-CM | POA: Diagnosis not present

## 2023-02-26 DIAGNOSIS — R278 Other lack of coordination: Secondary | ICD-10-CM | POA: Diagnosis not present

## 2023-02-26 DIAGNOSIS — M6281 Muscle weakness (generalized): Secondary | ICD-10-CM | POA: Diagnosis not present

## 2023-02-26 DIAGNOSIS — J449 Chronic obstructive pulmonary disease, unspecified: Secondary | ICD-10-CM | POA: Diagnosis not present

## 2023-02-26 DIAGNOSIS — R296 Repeated falls: Secondary | ICD-10-CM | POA: Diagnosis not present

## 2023-02-26 DIAGNOSIS — F0392 Unspecified dementia, unspecified severity, with psychotic disturbance: Secondary | ICD-10-CM | POA: Diagnosis not present

## 2023-02-27 DIAGNOSIS — J449 Chronic obstructive pulmonary disease, unspecified: Secondary | ICD-10-CM | POA: Diagnosis not present

## 2023-02-27 DIAGNOSIS — F0392 Unspecified dementia, unspecified severity, with psychotic disturbance: Secondary | ICD-10-CM | POA: Diagnosis not present

## 2023-02-27 DIAGNOSIS — R278 Other lack of coordination: Secondary | ICD-10-CM | POA: Diagnosis not present

## 2023-02-27 DIAGNOSIS — M4322 Fusion of spine, cervical region: Secondary | ICD-10-CM | POA: Diagnosis not present

## 2023-02-27 DIAGNOSIS — M6281 Muscle weakness (generalized): Secondary | ICD-10-CM | POA: Diagnosis not present

## 2023-02-27 DIAGNOSIS — R296 Repeated falls: Secondary | ICD-10-CM | POA: Diagnosis not present

## 2023-03-02 DIAGNOSIS — F0392 Unspecified dementia, unspecified severity, with psychotic disturbance: Secondary | ICD-10-CM | POA: Diagnosis not present

## 2023-03-02 DIAGNOSIS — M4322 Fusion of spine, cervical region: Secondary | ICD-10-CM | POA: Diagnosis not present

## 2023-03-02 DIAGNOSIS — J449 Chronic obstructive pulmonary disease, unspecified: Secondary | ICD-10-CM | POA: Diagnosis not present

## 2023-03-02 DIAGNOSIS — M6281 Muscle weakness (generalized): Secondary | ICD-10-CM | POA: Diagnosis not present

## 2023-03-02 DIAGNOSIS — R278 Other lack of coordination: Secondary | ICD-10-CM | POA: Diagnosis not present

## 2023-03-02 DIAGNOSIS — R296 Repeated falls: Secondary | ICD-10-CM | POA: Diagnosis not present

## 2023-03-03 DIAGNOSIS — R296 Repeated falls: Secondary | ICD-10-CM | POA: Diagnosis not present

## 2023-03-03 DIAGNOSIS — R278 Other lack of coordination: Secondary | ICD-10-CM | POA: Diagnosis not present

## 2023-03-03 DIAGNOSIS — M4322 Fusion of spine, cervical region: Secondary | ICD-10-CM | POA: Diagnosis not present

## 2023-03-03 DIAGNOSIS — F0392 Unspecified dementia, unspecified severity, with psychotic disturbance: Secondary | ICD-10-CM | POA: Diagnosis not present

## 2023-03-03 DIAGNOSIS — J449 Chronic obstructive pulmonary disease, unspecified: Secondary | ICD-10-CM | POA: Diagnosis not present

## 2023-03-03 DIAGNOSIS — M6281 Muscle weakness (generalized): Secondary | ICD-10-CM | POA: Diagnosis not present

## 2023-03-04 DIAGNOSIS — R278 Other lack of coordination: Secondary | ICD-10-CM | POA: Diagnosis not present

## 2023-03-04 DIAGNOSIS — M6281 Muscle weakness (generalized): Secondary | ICD-10-CM | POA: Diagnosis not present

## 2023-03-04 DIAGNOSIS — J449 Chronic obstructive pulmonary disease, unspecified: Secondary | ICD-10-CM | POA: Diagnosis not present

## 2023-03-04 DIAGNOSIS — R296 Repeated falls: Secondary | ICD-10-CM | POA: Diagnosis not present

## 2023-03-04 DIAGNOSIS — M4322 Fusion of spine, cervical region: Secondary | ICD-10-CM | POA: Diagnosis not present

## 2023-03-04 DIAGNOSIS — F0392 Unspecified dementia, unspecified severity, with psychotic disturbance: Secondary | ICD-10-CM | POA: Diagnosis not present

## 2023-03-05 DIAGNOSIS — M6281 Muscle weakness (generalized): Secondary | ICD-10-CM | POA: Diagnosis not present

## 2023-03-05 DIAGNOSIS — R296 Repeated falls: Secondary | ICD-10-CM | POA: Diagnosis not present

## 2023-03-05 DIAGNOSIS — J449 Chronic obstructive pulmonary disease, unspecified: Secondary | ICD-10-CM | POA: Diagnosis not present

## 2023-03-05 DIAGNOSIS — R278 Other lack of coordination: Secondary | ICD-10-CM | POA: Diagnosis not present

## 2023-03-05 DIAGNOSIS — F0392 Unspecified dementia, unspecified severity, with psychotic disturbance: Secondary | ICD-10-CM | POA: Diagnosis not present

## 2023-03-05 DIAGNOSIS — M4322 Fusion of spine, cervical region: Secondary | ICD-10-CM | POA: Diagnosis not present

## 2023-03-06 DIAGNOSIS — F0392 Unspecified dementia, unspecified severity, with psychotic disturbance: Secondary | ICD-10-CM | POA: Diagnosis not present

## 2023-03-06 DIAGNOSIS — R2689 Other abnormalities of gait and mobility: Secondary | ICD-10-CM | POA: Diagnosis not present

## 2023-03-06 DIAGNOSIS — J449 Chronic obstructive pulmonary disease, unspecified: Secondary | ICD-10-CM | POA: Diagnosis not present

## 2023-03-06 DIAGNOSIS — R293 Abnormal posture: Secondary | ICD-10-CM | POA: Diagnosis not present

## 2023-03-06 DIAGNOSIS — R41841 Cognitive communication deficit: Secondary | ICD-10-CM | POA: Diagnosis not present

## 2023-03-06 DIAGNOSIS — M4322 Fusion of spine, cervical region: Secondary | ICD-10-CM | POA: Diagnosis not present

## 2023-03-06 DIAGNOSIS — R1310 Dysphagia, unspecified: Secondary | ICD-10-CM | POA: Diagnosis not present

## 2023-03-06 DIAGNOSIS — R296 Repeated falls: Secondary | ICD-10-CM | POA: Diagnosis not present

## 2023-03-06 DIAGNOSIS — M6281 Muscle weakness (generalized): Secondary | ICD-10-CM | POA: Diagnosis not present

## 2023-03-06 DIAGNOSIS — R278 Other lack of coordination: Secondary | ICD-10-CM | POA: Diagnosis not present

## 2023-03-07 DIAGNOSIS — R600 Localized edema: Secondary | ICD-10-CM | POA: Diagnosis not present

## 2023-03-09 DIAGNOSIS — R600 Localized edema: Secondary | ICD-10-CM | POA: Diagnosis not present

## 2023-03-09 DIAGNOSIS — N139 Obstructive and reflux uropathy, unspecified: Secondary | ICD-10-CM | POA: Diagnosis not present

## 2023-03-09 DIAGNOSIS — I872 Venous insufficiency (chronic) (peripheral): Secondary | ICD-10-CM | POA: Diagnosis not present

## 2023-03-09 DIAGNOSIS — N182 Chronic kidney disease, stage 2 (mild): Secondary | ICD-10-CM | POA: Diagnosis not present

## 2023-03-10 DIAGNOSIS — R601 Generalized edema: Secondary | ICD-10-CM | POA: Diagnosis not present

## 2023-03-17 DIAGNOSIS — F331 Major depressive disorder, recurrent, moderate: Secondary | ICD-10-CM | POA: Diagnosis not present

## 2023-03-17 DIAGNOSIS — I10 Essential (primary) hypertension: Secondary | ICD-10-CM | POA: Diagnosis not present

## 2023-03-17 DIAGNOSIS — F03918 Unspecified dementia, unspecified severity, with other behavioral disturbance: Secondary | ICD-10-CM | POA: Diagnosis not present

## 2023-03-17 DIAGNOSIS — F29 Unspecified psychosis not due to a substance or known physiological condition: Secondary | ICD-10-CM | POA: Diagnosis not present

## 2023-03-17 DIAGNOSIS — F411 Generalized anxiety disorder: Secondary | ICD-10-CM | POA: Diagnosis not present

## 2023-03-17 DIAGNOSIS — G8929 Other chronic pain: Secondary | ICD-10-CM | POA: Diagnosis not present

## 2023-03-23 DIAGNOSIS — N182 Chronic kidney disease, stage 2 (mild): Secondary | ICD-10-CM | POA: Diagnosis not present

## 2023-03-23 DIAGNOSIS — N401 Enlarged prostate with lower urinary tract symptoms: Secondary | ICD-10-CM | POA: Diagnosis not present

## 2023-03-23 DIAGNOSIS — F0392 Unspecified dementia, unspecified severity, with psychotic disturbance: Secondary | ICD-10-CM | POA: Diagnosis not present

## 2023-03-23 DIAGNOSIS — F0393 Unspecified dementia, unspecified severity, with mood disturbance: Secondary | ICD-10-CM | POA: Diagnosis not present

## 2023-03-23 DIAGNOSIS — F03911 Unspecified dementia, unspecified severity, with agitation: Secondary | ICD-10-CM | POA: Diagnosis not present

## 2023-03-23 DIAGNOSIS — M159 Polyosteoarthritis, unspecified: Secondary | ICD-10-CM | POA: Diagnosis not present

## 2023-03-23 DIAGNOSIS — F419 Anxiety disorder, unspecified: Secondary | ICD-10-CM | POA: Diagnosis not present

## 2023-03-23 DIAGNOSIS — R338 Other retention of urine: Secondary | ICD-10-CM | POA: Diagnosis not present

## 2023-03-23 DIAGNOSIS — K5900 Constipation, unspecified: Secondary | ICD-10-CM | POA: Diagnosis not present

## 2023-03-23 DIAGNOSIS — F0394 Unspecified dementia, unspecified severity, with anxiety: Secondary | ICD-10-CM | POA: Diagnosis not present

## 2023-03-23 DIAGNOSIS — F319 Bipolar disorder, unspecified: Secondary | ICD-10-CM | POA: Diagnosis not present

## 2023-03-23 DIAGNOSIS — D509 Iron deficiency anemia, unspecified: Secondary | ICD-10-CM | POA: Diagnosis not present

## 2023-04-14 DIAGNOSIS — F0390 Unspecified dementia without behavioral disturbance: Secondary | ICD-10-CM | POA: Diagnosis not present

## 2023-04-14 DIAGNOSIS — F339 Major depressive disorder, recurrent, unspecified: Secondary | ICD-10-CM | POA: Diagnosis not present

## 2023-04-14 DIAGNOSIS — F411 Generalized anxiety disorder: Secondary | ICD-10-CM | POA: Diagnosis not present

## 2023-04-14 DIAGNOSIS — G8929 Other chronic pain: Secondary | ICD-10-CM | POA: Diagnosis not present

## 2023-04-14 DIAGNOSIS — F29 Unspecified psychosis not due to a substance or known physiological condition: Secondary | ICD-10-CM | POA: Diagnosis not present

## 2023-04-15 DIAGNOSIS — M79675 Pain in left toe(s): Secondary | ICD-10-CM | POA: Diagnosis not present

## 2023-04-15 DIAGNOSIS — B351 Tinea unguium: Secondary | ICD-10-CM | POA: Diagnosis not present

## 2023-04-15 DIAGNOSIS — M79674 Pain in right toe(s): Secondary | ICD-10-CM | POA: Diagnosis not present

## 2023-04-24 DIAGNOSIS — K5900 Constipation, unspecified: Secondary | ICD-10-CM | POA: Diagnosis not present

## 2023-04-24 DIAGNOSIS — F0394 Unspecified dementia, unspecified severity, with anxiety: Secondary | ICD-10-CM | POA: Diagnosis not present

## 2023-04-24 DIAGNOSIS — F03911 Unspecified dementia, unspecified severity, with agitation: Secondary | ICD-10-CM | POA: Diagnosis not present

## 2023-04-24 DIAGNOSIS — Z960 Presence of urogenital implants: Secondary | ICD-10-CM | POA: Diagnosis not present

## 2023-04-24 DIAGNOSIS — F339 Major depressive disorder, recurrent, unspecified: Secondary | ICD-10-CM | POA: Diagnosis not present

## 2023-04-24 DIAGNOSIS — F0393 Unspecified dementia, unspecified severity, with mood disturbance: Secondary | ICD-10-CM | POA: Diagnosis not present

## 2023-04-24 DIAGNOSIS — M159 Polyosteoarthritis, unspecified: Secondary | ICD-10-CM | POA: Diagnosis not present

## 2023-04-24 DIAGNOSIS — D509 Iron deficiency anemia, unspecified: Secondary | ICD-10-CM | POA: Diagnosis not present

## 2023-04-24 DIAGNOSIS — F419 Anxiety disorder, unspecified: Secondary | ICD-10-CM | POA: Diagnosis not present

## 2023-04-24 DIAGNOSIS — N138 Other obstructive and reflux uropathy: Secondary | ICD-10-CM | POA: Diagnosis not present

## 2023-05-05 DIAGNOSIS — N401 Enlarged prostate with lower urinary tract symptoms: Secondary | ICD-10-CM | POA: Diagnosis not present
